# Patient Record
Sex: Female | Born: 1955
Health system: Southern US, Community
[De-identification: ages and names within clinical notes are randomized; demographics above are authoritative.]

## PROBLEM LIST (undated history)

## (undated) DIAGNOSIS — M1990 Unspecified osteoarthritis, unspecified site: Secondary | ICD-10-CM

## (undated) DIAGNOSIS — Z8042 Family history of malignant neoplasm of prostate: Secondary | ICD-10-CM

## (undated) DIAGNOSIS — E785 Hyperlipidemia, unspecified: Secondary | ICD-10-CM

## (undated) DIAGNOSIS — Z9889 Other specified postprocedural states: Secondary | ICD-10-CM

## (undated) DIAGNOSIS — Z803 Family history of malignant neoplasm of breast: Secondary | ICD-10-CM

## (undated) DIAGNOSIS — J309 Allergic rhinitis, unspecified: Secondary | ICD-10-CM

## (undated) DIAGNOSIS — R51 Headache: Secondary | ICD-10-CM

## (undated) DIAGNOSIS — R112 Nausea with vomiting, unspecified: Secondary | ICD-10-CM

## (undated) DIAGNOSIS — R519 Headache, unspecified: Secondary | ICD-10-CM

## (undated) DIAGNOSIS — C50919 Malignant neoplasm of unspecified site of unspecified female breast: Secondary | ICD-10-CM

## (undated) DIAGNOSIS — I10 Essential (primary) hypertension: Secondary | ICD-10-CM

## (undated) HISTORY — DX: Malignant neoplasm of unspecified site of unspecified female breast: C50.919

## (undated) HISTORY — PX: FOOT SURGERY: SHX648

## (undated) HISTORY — PX: COLONOSCOPY: SHX174

## (undated) HISTORY — DX: Family history of malignant neoplasm of breast: Z80.3

## (undated) HISTORY — DX: Hyperlipidemia, unspecified: E78.5

## (undated) HISTORY — DX: Family history of malignant neoplasm of prostate: Z80.42

## (undated) HISTORY — PX: TUBAL LIGATION: SHX77

## (undated) HISTORY — DX: Allergic rhinitis, unspecified: J30.9

## (undated) HISTORY — DX: Unspecified osteoarthritis, unspecified site: M19.90

## (undated) HISTORY — PX: WISDOM TOOTH EXTRACTION: SHX21

## (undated) HISTORY — PX: FRACTURE SURGERY: SHX138

---

## 1997-10-06 ENCOUNTER — Emergency Department (HOSPITAL_COMMUNITY): Admission: EM | Admit: 1997-10-06 | Discharge: 1997-10-06 | Payer: Self-pay

## 1998-01-14 ENCOUNTER — Other Ambulatory Visit: Admission: RE | Admit: 1998-01-14 | Discharge: 1998-01-14 | Payer: Self-pay | Admitting: Obstetrics and Gynecology

## 2000-04-18 ENCOUNTER — Other Ambulatory Visit: Admission: RE | Admit: 2000-04-18 | Discharge: 2000-04-18 | Payer: Self-pay | Admitting: Obstetrics and Gynecology

## 2001-04-26 ENCOUNTER — Other Ambulatory Visit: Admission: RE | Admit: 2001-04-26 | Discharge: 2001-04-26 | Payer: Self-pay | Admitting: Obstetrics and Gynecology

## 2002-07-05 ENCOUNTER — Emergency Department (HOSPITAL_COMMUNITY): Admission: EM | Admit: 2002-07-05 | Discharge: 2002-07-05 | Payer: Self-pay | Admitting: Emergency Medicine

## 2004-12-30 ENCOUNTER — Other Ambulatory Visit: Admission: RE | Admit: 2004-12-30 | Discharge: 2004-12-30 | Payer: Self-pay | Admitting: Family Medicine

## 2006-07-13 ENCOUNTER — Other Ambulatory Visit: Admission: RE | Admit: 2006-07-13 | Discharge: 2006-07-13 | Payer: Self-pay | Admitting: Family Medicine

## 2009-01-20 ENCOUNTER — Other Ambulatory Visit: Admission: RE | Admit: 2009-01-20 | Discharge: 2009-01-20 | Payer: Self-pay | Admitting: Family Medicine

## 2009-04-02 ENCOUNTER — Emergency Department (HOSPITAL_COMMUNITY): Admission: EM | Admit: 2009-04-02 | Discharge: 2009-04-02 | Payer: Self-pay | Admitting: Family Medicine

## 2010-01-22 ENCOUNTER — Other Ambulatory Visit: Admission: RE | Admit: 2010-01-22 | Discharge: 2010-01-22 | Payer: Self-pay | Admitting: Family Medicine

## 2012-08-12 ENCOUNTER — Emergency Department (HOSPITAL_BASED_OUTPATIENT_CLINIC_OR_DEPARTMENT_OTHER)
Admission: EM | Admit: 2012-08-12 | Discharge: 2012-08-12 | Disposition: A | Discharge status: Home / Self Care | Payer: BC Managed Care – PPO | Attending: Emergency Medicine | Admitting: Emergency Medicine

## 2012-08-12 ENCOUNTER — Encounter (HOSPITAL_BASED_OUTPATIENT_CLINIC_OR_DEPARTMENT_OTHER): Payer: Self-pay | Admitting: Emergency Medicine

## 2012-08-12 DIAGNOSIS — R1110 Vomiting, unspecified: Secondary | ICD-10-CM

## 2012-08-12 DIAGNOSIS — Z7982 Long term (current) use of aspirin: Secondary | ICD-10-CM | POA: Insufficient documentation

## 2012-08-12 DIAGNOSIS — R112 Nausea with vomiting, unspecified: Secondary | ICD-10-CM | POA: Insufficient documentation

## 2012-08-12 DIAGNOSIS — R42 Dizziness and giddiness: Secondary | ICD-10-CM

## 2012-08-12 DIAGNOSIS — I10 Essential (primary) hypertension: Secondary | ICD-10-CM | POA: Insufficient documentation

## 2012-08-12 DIAGNOSIS — R109 Unspecified abdominal pain: Secondary | ICD-10-CM | POA: Insufficient documentation

## 2012-08-12 HISTORY — DX: Essential (primary) hypertension: I10

## 2012-08-12 LAB — CBC WITH DIFFERENTIAL/PLATELET
Basophils Absolute: 0 10*3/uL (ref 0.0–0.1)
Basophils Relative: 0 % (ref 0–1)
Eosinophils Absolute: 0 10*3/uL (ref 0.0–0.7)
Eosinophils Relative: 0 % (ref 0–5)
HCT: 34.1 % — ABNORMAL LOW (ref 36.0–46.0)
Hemoglobin: 11.5 g/dL — ABNORMAL LOW (ref 12.0–15.0)
Lymphocytes Relative: 11 % — ABNORMAL LOW (ref 12–46)
Lymphs Abs: 1 10*3/uL (ref 0.7–4.0)
MCH: 24.9 pg — ABNORMAL LOW (ref 26.0–34.0)
MCHC: 33.7 g/dL (ref 30.0–36.0)
MCV: 74 fL — ABNORMAL LOW (ref 78.0–100.0)
Monocytes Absolute: 0.6 10*3/uL (ref 0.1–1.0)
Monocytes Relative: 6 % (ref 3–12)
Neutro Abs: 7.7 10*3/uL (ref 1.7–7.7)
Neutrophils Relative %: 83 % — ABNORMAL HIGH (ref 43–77)
Platelets: 234 10*3/uL (ref 150–400)
RBC: 4.61 MIL/uL (ref 3.87–5.11)
RDW: 14.9 % (ref 11.5–15.5)
WBC: 9.3 10*3/uL (ref 4.0–10.5)

## 2012-08-12 LAB — COMPREHENSIVE METABOLIC PANEL
ALT: 9 U/L (ref 0–35)
AST: 18 U/L (ref 0–37)
Albumin: 3.8 g/dL (ref 3.5–5.2)
Alkaline Phosphatase: 87 U/L (ref 39–117)
BUN: 15 mg/dL (ref 6–23)
CO2: 25 mEq/L (ref 19–32)
Calcium: 9.7 mg/dL (ref 8.4–10.5)
Chloride: 102 mEq/L (ref 96–112)
Creatinine, Ser: 0.9 mg/dL (ref 0.50–1.10)
GFR calc Af Amer: 80 mL/min — ABNORMAL LOW (ref >90)
GFR calc non Af Amer: 69 mL/min — ABNORMAL LOW (ref >90)
Glucose, Bld: 107 mg/dL — ABNORMAL HIGH (ref 70–99)
Potassium: 3.7 mEq/L (ref 3.5–5.1)
Sodium: 139 mEq/L (ref 135–145)
Total Bilirubin: 0.5 mg/dL (ref 0.3–1.2)
Total Protein: 7.9 g/dL (ref 6.0–8.3)

## 2012-08-12 MED ORDER — METOCLOPRAMIDE HCL 5 MG/ML IJ SOLN
10.0000 mg | Freq: Once | INTRAMUSCULAR | Status: AC
  Administered 2012-08-12: 10 mg via INTRAVENOUS
  Filled 2012-08-12: qty 2

## 2012-08-12 MED ORDER — SODIUM CHLORIDE 0.9 % IV SOLN
Freq: Once | INTRAVENOUS | Status: AC
  Administered 2012-08-12: 21:00:00 via INTRAVENOUS

## 2012-08-12 MED ORDER — ONDANSETRON 4 MG PO TBDP
ORAL_TABLET | ORAL | Status: AC
  Administered 2012-08-12: 4 mg via ORAL
  Filled 2012-08-12: qty 1

## 2012-08-12 MED ORDER — MECLIZINE HCL 25 MG PO TABS
25.0000 mg | ORAL_TABLET | Freq: Once | ORAL | Status: AC
  Administered 2012-08-12: 25 mg via ORAL
  Filled 2012-08-12: qty 1

## 2012-08-12 MED ORDER — ONDANSETRON 4 MG PO TBDP: 4.0000 mg | ORAL_TABLET | Freq: Three times a day (TID) | ORAL | Status: DC | PRN

## 2012-08-12 MED ORDER — MECLIZINE HCL 25 MG PO TABS: 25.0000 mg | ORAL_TABLET | Freq: Four times a day (QID) | ORAL | Status: DC

## 2012-08-12 MED ORDER — ONDANSETRON HCL 4 MG/2ML IJ SOLN
4.0000 mg | Freq: Once | INTRAMUSCULAR | Status: AC
  Administered 2012-08-12: 4 mg via INTRAVENOUS
  Filled 2012-08-12: qty 2

## 2012-08-12 MED ORDER — ONDANSETRON 4 MG PO TBDP
4.0000 mg | ORAL_TABLET | Freq: Once | ORAL | Status: AC
  Administered 2012-08-12: 4 mg via ORAL

## 2012-08-12 NOTE — ED Notes (Signed)
Pt c/o nausea, dry heaves- ODT zofran given per protocol

## 2012-08-12 NOTE — ED Notes (Signed)
Pt woke up this am with dizziness, nausea and vomiting.  No known fever.  Some diarrhea.  Pt states she is weak.

## 2012-08-12 NOTE — ED Provider Notes (Signed)
History     CSN: [id number]  Arrival date & time 08/12/12  1818   First MD Initiated Contact with Patient 08/12/12 1935      Chief Complaint  Patient presents with  . Dizziness  . Emesis    (Consider location/radiation/quality/duration/timing/severity/associated sxs/prior treatment) Patient is a 57 y.o. female presenting with vomiting. The history is provided by the patient. No language interpreter was used.  Emesis Severity:  Mild Duration:  1 day Timing:  Intermittent Progression:  Worsening Chronicity:  New Recent urination:  Normal Relieved by:  Nothing Worsened by:  Nothing tried Ineffective treatments:  None tried Associated symptoms: no abdominal pain, no fever and no sore throat   Risk factors: no sick contacts and no suspect food intake     Past Medical History  Diagnosis Date  . Hypertension     No past surgical history on file.  No family history on file.  History  Substance Use Topics  . Smoking status: Not on file  . Smokeless tobacco: Not on file  . Alcohol Use: Not on file    OB History   Grav Para Term Preterm Abortions TAB SAB Ect Mult Living                  Review of Systems  HENT: Negative for sore throat.   Gastrointestinal: Positive for nausea and vomiting. Negative for abdominal pain.  All other systems reviewed and are negative.    Allergies  Review of patient's allergies indicates no known allergies.  Home Medications   Current Outpatient Rx  Name  Route  Sig  Dispense  Refill  . aspirin 81 MG tablet   Oral   Take 81 mg by mouth daily.         Marland Kitchen triamterene (DYRENIUM) 50 MG capsule   Oral   Take 50 mg by mouth 2 (two) times daily.           BP 168/83  Pulse 65  Temp(Src) 98.7 F (37.1 C) (Oral)  Resp 16  Ht 5\' 3"  (1.6 m)  Wt 170 lb (77.111 kg)  BMI 30.12 kg/m2  SpO2 98%  Physical Exam  Nursing note and vitals reviewed. Constitutional: She is oriented to person, place, and time. She appears  well-developed and well-nourished.  HENT:  Head: Normocephalic and atraumatic.  Right Ear: External ear normal.  Nose: Nose normal.  Mouth/Throat: Oropharynx is clear and moist.  Eyes: Conjunctivae and EOM are normal. Pupils are equal, round, and reactive to light.  Neck: Normal range of motion. Neck supple.  Cardiovascular: Normal rate.   Pulmonary/Chest: Effort normal and breath sounds normal.  Abdominal: Soft. There is tenderness.  Musculoskeletal: Normal range of motion.  Neurological: She is alert and oriented to person, place, and time. She has normal reflexes.  Skin: Skin is warm.  Psychiatric: She has a normal mood and affect.    ED Course  Procedures (including critical care time)  Labs Reviewed  CBC WITH DIFFERENTIAL  COMPREHENSIVE METABOLIC PANEL  URINALYSIS, ROUTINE W REFLEX MICROSCOPIC   No results found.   1. Vertigo   2. Vomiting       MDM  Pt given iv fluids, zofran.   Pt reports some continued nausea.   Pt given reglan and antivert.   Dr. Fonnie Jarvis in to see and examine.        Elson Areas, PA-C 08/12/12 2322  Lonia Skinner Delano, PA-C 08/12/12 2326

## 2012-08-12 NOTE — ED Provider Notes (Signed)
Medical screening examination/treatment/procedure(s) were conducted as a shared visit with non-physician practitioner(s) and myself.  I personally evaluated the patient during the encounter.  This 57 year old female woke up early this morning with sudden intermittent peripheral vertigo-type symptoms with nausea and occasional vomiting every time she moves her head, she she is no sudden onset headache no change in hearing no change in speech vision swallowing or understanding no focal or lateralizing weakness numbness or incoordination she is able to walk unassisted he feels safer if she walks with unassisted to her positional vertigo symptoms, she is no headache when her vertigo symptoms started early this morning but by this evening she is gradual onset of a very slight headache which is now severe at all she is no neck stiffness no trauma no fever no rash no chest pain no shortness breath no, pain and only had a couple loose stools which are nonbloody.  Mental status appears to be normal the patient is awake alert calm pleasant cooperative normal speech and gait is slow but not ataxic. Major cranial nerves appear to be intact no facial asymmetry pupils equal round react to light extraocular movements intact peripheral visual fields full to confrontation she does have lateral nystagmus with extraocular movements with fast component one direction only she does not have multidirectional nystagmus she is no vertical nystagmus no rotary nystagmus she has negative test of skew, she has normal light touch over her face and all 4 extremities she has normal 5 out of 5 strength in all 4 extremities with no pronator drift in her arms or legs she has normal bilateral finger to nose testing and gait is a bit slow but not ataxic.  Clinically I doubt subarachnoid hemorrhage stroke or serious bacterial infection I suspect peripheral vertigo and the patient does have peripheral vertigo in the past similar to this and has  occurred once every several months or so.  Hurman Horn, MD 08/14/12 [phone]

## 2013-03-27 ENCOUNTER — Other Ambulatory Visit: Payer: Self-pay | Admitting: Family Medicine

## 2013-03-27 ENCOUNTER — Other Ambulatory Visit (HOSPITAL_COMMUNITY)
Admission: RE | Admit: 2013-03-27 | Discharge: 2013-03-27 | Disposition: A | Discharge status: Home / Self Care | Payer: BC Managed Care – PPO | Source: Ambulatory Visit | Attending: Family Medicine | Admitting: Family Medicine

## 2013-03-27 DIAGNOSIS — Z0000 Encounter for general adult medical examination without abnormal findings: Secondary | ICD-10-CM | POA: Insufficient documentation

## 2013-10-23 ENCOUNTER — Ambulatory Visit (INDEPENDENT_AMBULATORY_CARE_PROVIDER_SITE_OTHER): Payer: BC Managed Care – PPO | Admitting: Emergency Medicine

## 2013-10-23 VITALS — BP 135/80 | HR 68 | Temp 98.5°F | Resp 16

## 2013-10-23 DIAGNOSIS — M76899 Other specified enthesopathies of unspecified lower limb, excluding foot: Secondary | ICD-10-CM

## 2013-10-23 DIAGNOSIS — M7071 Other bursitis of hip, right hip: Secondary | ICD-10-CM

## 2013-10-23 MED ORDER — ACETAMINOPHEN-CODEINE #3 300-30 MG PO TABS: 1.0000 | ORAL_TABLET | ORAL | Status: DC | PRN

## 2013-10-23 MED ORDER — NAPROXEN SODIUM 550 MG PO TABS: 550.0000 mg | ORAL_TABLET | Freq: Two times a day (BID) | ORAL | Status: AC

## 2013-10-23 NOTE — Progress Notes (Signed)
[facility] [address] [place] [address] [phone]  Date:  10/23/2013   Name:  Emily Perez   DOB:  November 17, 1955   MRN:  [id number]  PCP:  Shirline Frees, MD    Chief Complaint: Hip Pain   History of Present Illness:  Emily Perez is a 58 y.o. very pleasant female patient who presents with the following:  Sudden onset of pain in right hip in middle of the night.  Awakened her from a sound sleep.  During the evening she had used a treadmill for a workout.  Had no injury or unusual exertion. No history of injury previously.  No history of inflammatory joint disease. No improvement with over the counter medications or other home remedies. Denies other complaint or health concern today.   There are no active problems to display for this patient.   Past Medical History  Diagnosis Date  . Hypertension   . Arthritis     No past surgical history on file.  History  Substance Use Topics  . Smoking status: Never Smoker   . Smokeless tobacco: Not on file  . Alcohol Use: Not on file    Family History  Problem Relation Age of Onset  . Heart disease Mother   . Cancer Father   . Diabetes Father   . Hypertension Father   . Hyperlipidemia Father   . Heart disease Brother   . Hypertension Brother   . Hyperlipidemia Brother   . Diabetes Brother     No Known Allergies  Medication list has been reviewed and updated.  Current Outpatient Prescriptions on File Prior to Visit  Medication Sig Dispense Refill  . aspirin 81 MG tablet Take 81 mg by mouth daily.      Marland Kitchen triamterene (DYRENIUM) 50 MG capsule Take 50 mg by mouth 2 (two) times daily.       No current facility-administered medications on file prior to visit.    Review of Systems:  As per HPI, otherwise negative.    Physical Examination: Filed Vitals:   10/23/13 1934  BP: 135/80  Pulse: 68  Temp: 98.5 F (36.9 C)  Resp: 16   There were no vitals filed for this  visit. There is no weight on file to calculate BMI. Ideal Body Weight:     GEN: WDWN, NAD, Non-toxic, Alert & Oriented x 3 HEENT: Atraumatic, Normocephalic.  Ears and Nose: No external deformity. EXTR: No clubbing/cyanosis/edema NEURO: Normal gait.  PSYCH: Normally interactive. Conversant. Not depressed or anxious appearing.  Calm demeanor.  Marked right hip tenderness with full painless PROM.  Assessment and Plan: Bursitis right hip Anaprox tyl #3  Signed,  Ellison Carwin, MD

## 2013-10-23 NOTE — Patient Instructions (Signed)
Bursitis Bursitis Bursitis is a swelling and soreness (inflammation) of a fluid-filled sac (bursa) that overlies and protects a joint. It can be caused by injury, overuse of the joint, arthritis or infection. The joints most likely to be affected are the elbows, shoulders, hips and knees. HOME CARE INSTRUCTIONS   Apply ice to the affected area for 15-20 minutes each hour while awake for 2 days. Put the ice in a plastic bag and place a towel between the bag of ice and your skin.  Rest the injured joint as much as possible, but continue to put the joint through a full range of motion, 4 times per day. (The shoulder joint especially becomes rapidly "frozen" if not used.) When the pain lessens, begin normal slow movements and usual activities.  Only take over-the-counter or prescription medicines for pain, discomfort or fever as directed by your caregiver.  Your caregiver may recommend draining the bursa and injecting medicine into the bursa. This may help the healing process.  Follow all instructions for follow-up with your caregiver. This includes any orthopedic referrals, physical therapy and rehabilitation. Any delay in obtaining necessary care could result in a delay or failure of the bursitis to heal and chronic pain. SEEK IMMEDIATE MEDICAL CARE IF:   Your pain increases even during treatment.  You develop an oral temperature above 102 F (38.9 C) and have heat and inflammation over the involved bursa. MAKE SURE YOU:   Understand these instructions.  Will watch your condition.  Will get help right away if you are not doing well or get worse. Document Released: 03/05/2000 Document Revised: 05/31/2011 Document Reviewed: 05/28/2013 [facility] Patient Information 2015 [place], [place]. This information is not intended to replace advice given to you by your health care provider. Make sure you discuss any questions you have with your health care provider.

## 2014-10-08 ENCOUNTER — Other Ambulatory Visit (HOSPITAL_COMMUNITY)
Admission: RE | Admit: 2014-10-08 | Discharge: 2014-10-08 | Disposition: A | Discharge status: Home / Self Care | Payer: BC Managed Care – PPO | Source: Ambulatory Visit | Attending: Family Medicine | Admitting: Family Medicine

## 2014-10-08 ENCOUNTER — Other Ambulatory Visit: Payer: Self-pay | Admitting: Family Medicine

## 2014-10-08 DIAGNOSIS — Z01419 Encounter for gynecological examination (general) (routine) without abnormal findings: Secondary | ICD-10-CM | POA: Insufficient documentation

## 2014-10-08 DIAGNOSIS — Z1151 Encounter for screening for human papillomavirus (HPV): Secondary | ICD-10-CM | POA: Diagnosis present

## 2014-10-09 LAB — CYTOLOGY - PAP

## 2015-02-06 ENCOUNTER — Ambulatory Visit (INDEPENDENT_AMBULATORY_CARE_PROVIDER_SITE_OTHER): Payer: BC Managed Care – PPO | Admitting: Emergency Medicine

## 2015-02-06 VITALS — BP 130/84 | HR 98 | Temp 98.5°F | Resp 16 | Ht 63.52 in | Wt 182.0 lb

## 2015-02-06 DIAGNOSIS — M5431 Sciatica, right side: Secondary | ICD-10-CM | POA: Diagnosis not present

## 2015-02-06 MED ORDER — TRAMADOL HCL 50 MG PO TABS: 50.0000 mg | ORAL_TABLET | Freq: Three times a day (TID) | ORAL | Status: DC | PRN

## 2015-02-06 MED ORDER — CYCLOBENZAPRINE HCL 5 MG PO TABS: 5.0000 mg | ORAL_TABLET | Freq: Three times a day (TID) | ORAL | Status: DC | PRN

## 2015-02-06 NOTE — Patient Instructions (Signed)

## 2015-02-06 NOTE — Progress Notes (Signed)
Subjective:  Patient ID: Emily Perez, female    DOB: 1956-01-07  Age: 59 y.o. MRN: [id number]  CC: Leg Pain   HPI Emily Perez presents  with pain in her right sciatic notch radiating down her right leg. When he went on a number of headaches over the weekend traveling of about 11 miles. Since that time she's had right sciatic notch pain. Worse when she sits. She stands or walks. History of direct injury. Has no improvement with over-the-counter medication is no history of prior back pain back injury. She has no numbness tingling or weakness in her leg  History [name] has a past medical history of Hypertension and Arthritis.   She has no past surgical history on file.   Her  family history includes Cancer in her father and sister; Diabetes in her brother and father; Heart disease in her brother and mother; Hyperlipidemia in her brother and father; Hypertension in her brother and father.  She   reports that she has never smoked. She does not have any smokeless tobacco history on file. She reports that she does not drink alcohol or use illicit drugs.  Outpatient Prescriptions Prior to Visit  Medication Sig Dispense Refill  . aspirin 81 MG tablet Take 81 mg by mouth daily.    Marland Kitchen triamterene (DYRENIUM) 50 MG capsule Take 50 mg by mouth 2 (two) times daily.    Marland Kitchen acetaminophen-codeine (TYLENOL #3) 300-30 MG per tablet Take 1-2 tablets by mouth every 4 (four) hours as needed. 30 tablet 0   No facility-administered medications prior to visit.    Social History   Social History  . Marital Status: Legally Separated    Spouse Name: N/A  . Number of Children: N/A  . Years of Education: N/A   Social History Main Topics  . Smoking status: Never Smoker   . Smokeless tobacco: None  . Alcohol Use: No  . Drug Use: No  . Sexual Activity: Not Asked   Other Topics Concern  . None   Social History Narrative     Review of Systems  Constitutional: Negative for fever, chills  and appetite change.  HENT: Negative for congestion, ear pain, postnasal drip, sinus pressure and sore throat.   Eyes: Negative for pain and redness.  Respiratory: Negative for cough, shortness of breath and wheezing.   Cardiovascular: Negative for leg swelling.  Gastrointestinal: Negative for nausea, vomiting, abdominal pain, diarrhea, constipation and blood in stool.  Endocrine: Negative for polyuria.  Genitourinary: Negative for dysuria, urgency, frequency and flank pain.  Musculoskeletal: Positive for back pain. Negative for gait problem.  Skin: Negative for rash.  Neurological: Negative for weakness and headaches.  Psychiatric/Behavioral: Negative for confusion and decreased concentration. The patient is not nervous/anxious.     Objective:  BP 130/84 mmHg  Pulse 98  Temp(Src) 98.5 F (36.9 C) (Oral)  Resp 16  Ht 5' 3.52" (1.613 m)  Wt 182 lb (82.555 kg)  BMI 31.73 kg/m2  SpO2 98%  Physical Exam  Constitutional: She is oriented to person, place, and time. She appears well-developed and well-nourished.  HENT:  Head: Normocephalic and atraumatic.  Eyes: Conjunctivae are normal. Pupils are equal, round, and reactive to light.  Pulmonary/Chest: Effort normal.  Musculoskeletal: She exhibits no edema.       Lumbar back: She exhibits pain. She exhibits no tenderness and no spasm.  Neurological: She is alert and oriented to person, place, and time.  Skin: Skin is dry.  Psychiatric: She has  a normal mood and affect. Her behavior is normal. Thought content normal.      Assessment & Plan:   Aerielle was seen today for leg pain.  Diagnoses and all orders for this visit:  Right sciatic nerve pain  Other orders -     traMADol (ULTRAM) 50 MG tablet; Take 1 tablet (50 mg total) by mouth every 8 (eight) hours as needed. -     cyclobenzaprine (FLEXERIL) 5 MG tablet; Take 1 tablet (5 mg total) by mouth 3 (three) times daily as needed for muscle spasms.   I have discontinued Ms.  Piscitello's acetaminophen-codeine. I am also having her start on traMADol and cyclobenzaprine. Additionally, I am having her maintain her triamterene and aspirin.  Meds ordered this encounter  Medications  . traMADol (ULTRAM) 50 MG tablet    Sig: Take 1 tablet (50 mg total) by mouth every 8 (eight) hours as needed.    Dispense:  30 tablet    Refill:  0  . cyclobenzaprine (FLEXERIL) 5 MG tablet    Sig: Take 1 tablet (5 mg total) by mouth 3 (three) times daily as needed for muscle spasms.    Dispense:  30 tablet    Refill:  0    Appropriate red flag conditions were discussed with the patient as well as actions that should be taken.  Patient expressed his understanding.  Follow-up: Return if symptoms worsen or fail to improve.  Roselee Culver, MD

## 2015-05-08 ENCOUNTER — Encounter: Payer: BC Managed Care – PPO | Admitting: Podiatry

## 2015-06-20 NOTE — Progress Notes (Signed)
This encounter was created in error - please disregard.

## 2016-08-12 ENCOUNTER — Ambulatory Visit: Payer: BC Managed Care – PPO | Admitting: Podiatry

## 2016-08-27 ENCOUNTER — Ambulatory Visit: Payer: BC Managed Care – PPO | Admitting: Podiatry

## 2016-11-26 ENCOUNTER — Other Ambulatory Visit (HOSPITAL_COMMUNITY)
Admission: RE | Admit: 2016-11-26 | Discharge: 2016-11-26 | Disposition: A | Discharge status: Home / Self Care | Payer: BC Managed Care – PPO | Source: Ambulatory Visit | Attending: Family Medicine | Admitting: Family Medicine

## 2016-11-26 ENCOUNTER — Other Ambulatory Visit: Payer: Self-pay | Admitting: Family Medicine

## 2016-11-26 DIAGNOSIS — N950 Postmenopausal bleeding: Secondary | ICD-10-CM | POA: Insufficient documentation

## 2016-11-29 ENCOUNTER — Other Ambulatory Visit: Payer: Self-pay | Admitting: Family Medicine

## 2016-11-29 DIAGNOSIS — N950 Postmenopausal bleeding: Secondary | ICD-10-CM

## 2016-11-30 LAB — CYTOLOGY - PAP: Diagnosis: NEGATIVE

## 2016-12-21 ENCOUNTER — Ambulatory Visit
Admission: RE | Admit: 2016-12-21 | Discharge: 2016-12-21 | Disposition: A | Discharge status: Home / Self Care | Payer: BC Managed Care – PPO | Source: Ambulatory Visit | Attending: Family Medicine | Admitting: Family Medicine

## 2016-12-21 DIAGNOSIS — N950 Postmenopausal bleeding: Secondary | ICD-10-CM

## 2017-01-07 ENCOUNTER — Other Ambulatory Visit (HOSPITAL_COMMUNITY)
Admission: RE | Admit: 2017-01-07 | Discharge: 2017-01-07 | Disposition: A | Discharge status: Home / Self Care | Payer: BC Managed Care – PPO | Source: Ambulatory Visit | Attending: Obstetrics and Gynecology | Admitting: Obstetrics and Gynecology

## 2017-01-07 ENCOUNTER — Other Ambulatory Visit: Payer: Self-pay | Admitting: Obstetrics and Gynecology

## 2017-01-07 DIAGNOSIS — Z1151 Encounter for screening for human papillomavirus (HPV): Secondary | ICD-10-CM | POA: Insufficient documentation

## 2017-01-11 LAB — CERVICOVAGINAL ANCILLARY ONLY: HPV: NOT DETECTED

## 2017-05-02 ENCOUNTER — Other Ambulatory Visit: Payer: Self-pay | Admitting: Radiology

## 2017-05-05 ENCOUNTER — Encounter: Payer: Self-pay | Admitting: *Deleted

## 2017-05-05 ENCOUNTER — Telehealth: Payer: Self-pay | Admitting: Oncology

## 2017-05-05 NOTE — Telephone Encounter (Signed)
Spoke with patient to confirm morning [facility] appointment for 2/20, [facility] patient no packet sent

## 2017-05-06 ENCOUNTER — Other Ambulatory Visit: Payer: Self-pay | Admitting: *Deleted

## 2017-05-06 DIAGNOSIS — C50511 Malignant neoplasm of lower-outer quadrant of right female breast: Secondary | ICD-10-CM | POA: Insufficient documentation

## 2017-05-06 DIAGNOSIS — Z171 Estrogen receptor negative status [ER-]: Secondary | ICD-10-CM

## 2017-05-10 ENCOUNTER — Encounter: Payer: Self-pay | Admitting: Genetics

## 2017-05-11 ENCOUNTER — Encounter: Payer: Self-pay | Admitting: Physical Therapy

## 2017-05-11 ENCOUNTER — Encounter: Payer: Self-pay | Admitting: *Deleted

## 2017-05-11 ENCOUNTER — Ambulatory Visit
Admission: RE | Admit: 2017-05-11 | Discharge: 2017-05-11 | Disposition: A | Discharge status: Home / Self Care | Payer: BC Managed Care – PPO | Source: Ambulatory Visit | Attending: Radiation Oncology | Admitting: Radiation Oncology

## 2017-05-11 ENCOUNTER — Inpatient Hospital Stay: Payer: BC Managed Care – PPO

## 2017-05-11 ENCOUNTER — Encounter: Payer: Self-pay | Admitting: Oncology

## 2017-05-11 ENCOUNTER — Encounter: Payer: Self-pay | Admitting: General Practice

## 2017-05-11 ENCOUNTER — Inpatient Hospital Stay: Payer: BC Managed Care – PPO | Attending: Oncology | Admitting: Oncology

## 2017-05-11 ENCOUNTER — Ambulatory Visit: Payer: BC Managed Care – PPO | Attending: General Surgery | Admitting: Physical Therapy

## 2017-05-11 ENCOUNTER — Other Ambulatory Visit: Payer: Self-pay

## 2017-05-11 VITALS — BP 141/82 | HR 77 | Temp 98.3°F | Resp 20 | Ht 63.5 in | Wt 190.7 lb

## 2017-05-11 DIAGNOSIS — R293 Abnormal posture: Secondary | ICD-10-CM | POA: Insufficient documentation

## 2017-05-11 DIAGNOSIS — Z171 Estrogen receptor negative status [ER-]: Secondary | ICD-10-CM | POA: Insufficient documentation

## 2017-05-11 DIAGNOSIS — C50511 Malignant neoplasm of lower-outer quadrant of right female breast: Secondary | ICD-10-CM

## 2017-05-11 DIAGNOSIS — M25511 Pain in right shoulder: Secondary | ICD-10-CM | POA: Insufficient documentation

## 2017-05-11 DIAGNOSIS — Z803 Family history of malignant neoplasm of breast: Secondary | ICD-10-CM | POA: Diagnosis not present

## 2017-05-11 DIAGNOSIS — M25611 Stiffness of right shoulder, not elsewhere classified: Secondary | ICD-10-CM | POA: Diagnosis present

## 2017-05-11 DIAGNOSIS — C50411 Malignant neoplasm of upper-outer quadrant of right female breast: Secondary | ICD-10-CM | POA: Insufficient documentation

## 2017-05-11 DIAGNOSIS — G8929 Other chronic pain: Secondary | ICD-10-CM | POA: Insufficient documentation

## 2017-05-11 LAB — CMP (CANCER CENTER ONLY)
ALT: 11 U/L (ref 0–55)
AST: 16 U/L (ref 5–34)
Albumin: 3.7 g/dL (ref 3.5–5.0)
Alkaline Phosphatase: 74 U/L (ref 40–150)
Anion gap: 11 (ref 3–11)
BUN: 33 mg/dL — ABNORMAL HIGH (ref 7–26)
CO2: 27 mmol/L (ref 22–29)
Calcium: 9.7 mg/dL (ref 8.4–10.4)
Chloride: 103 mmol/L (ref 98–109)
Creatinine: 1.12 mg/dL — ABNORMAL HIGH (ref 0.60–1.10)
GFR, Est AFR Am: 59 mL/min — ABNORMAL LOW (ref >60)
GFR, Estimated: 51 mL/min — ABNORMAL LOW (ref >60)
Glucose, Bld: 84 mg/dL (ref 70–140)
Potassium: 3.7 mmol/L (ref 3.5–5.1)
Sodium: 141 mmol/L (ref 136–145)
Total Bilirubin: 0.4 mg/dL (ref 0.2–1.2)
Total Protein: 8.1 g/dL (ref 6.4–8.3)

## 2017-05-11 LAB — CBC WITH DIFFERENTIAL (CANCER CENTER ONLY)
Basophils Absolute: 0 10*3/uL (ref 0.0–0.1)
Basophils Relative: 0 %
Eosinophils Absolute: 0.2 10*3/uL (ref 0.0–0.5)
Eosinophils Relative: 2 %
HCT: 37 % (ref 34.8–46.6)
Hemoglobin: 11.6 g/dL (ref 11.6–15.9)
Lymphocytes Relative: 22 %
Lymphs Abs: 2.3 10*3/uL (ref 0.9–3.3)
MCH: 24.6 pg — ABNORMAL LOW (ref 25.1–34.0)
MCHC: 31.4 g/dL — ABNORMAL LOW (ref 31.5–36.0)
MCV: 78.6 fL — ABNORMAL LOW (ref 79.5–101.0)
Monocytes Absolute: 1 10*3/uL — ABNORMAL HIGH (ref 0.1–0.9)
Monocytes Relative: 9 %
Neutro Abs: 6.9 10*3/uL — ABNORMAL HIGH (ref 1.5–6.5)
Neutrophils Relative %: 67 %
Platelet Count: 243 10*3/uL (ref 145–400)
RBC: 4.71 MIL/uL (ref 3.70–5.45)
RDW: 14.7 % — ABNORMAL HIGH (ref 11.2–14.5)
WBC Count: 10.4 10*3/uL — ABNORMAL HIGH (ref 3.9–10.3)

## 2017-05-11 NOTE — Therapy (Addendum)
[place], [place], [address] Phone: [phone]   Fax:  [phone]  Physical Therapy Evaluation  Patient Details  Name: Emily Perez MRN: [id number] Date of Birth: [birth year] Referring Provider: Dr. Rolm Bookbinder   Encounter Date: 05/11/2017  PT End of Session - 05/11/17 1257    Visit Number  1    Number of Visits  1    PT Start Time  6962    PT Stop Time  9528 Also saw pt from 4132-4401 and 1150-1204 for a total of 36 minutes    PT Time Calculation (min)  14 min    Activity Tolerance  Patient tolerated treatment well    Behavior During Therapy  [facility] for tasks assessed/performed       Past Medical History:  Diagnosis Date  . Arthritis   . Hypertension     History reviewed. No pertinent surgical history.  There were no vitals filed for this visit.   Subjective Assessment - 05/11/17 1246    Subjective  Patient reports she is here to be seen by her medical team for her newly diagnosed right breast cancer.    Patient is accompained by:  Family member    Pertinent History  Patient was diagnosed on 04/19/17 with right triple negative breast cancer. It measures 2.5 cm and is located in the upper outer quadrant. She had 3 abnormal appearing axillary lymph nodes. One of those was found to be positive.     Patient Stated Goals  Reduce lymphedema risk and learn post op shoulder ROM HEP    Currently in Pain?  Yes    Pain Score  8  This is from a fall at work    Pain Location  Shoulder    Pain Orientation  Right    Pain Descriptors / Indicators  Aching    Pain Type  Chronic pain    Pain Onset  More than a month ago    Pain Frequency  Intermittent    Aggravating Factors   Laying down    Pain Relieving Factors  Medication    Multiple Pain Sites  No         [facility] PT Assessment - 05/11/17 0001      Assessment   Medical Diagnosis  Right shoulder pain    Referring Provider  Dr. Rolm Bookbinder    Onset Date/Surgical Date  04/19/17    Hand Dominance  Right    Prior Therapy  none      Precautions   Precautions  Other (comment)    Precaution Comments  active cancer      Restrictions   Weight Bearing Restrictions  No      Balance Screen   Has the patient fallen in the past 6 months  Yes    How many times?  1 At work being treated for a rotator cuff tear by orthopedist    Has the patient had a decrease in activity level because of a fear of falling?   No    Is the patient reluctant to leave their home because of a fear of falling?   No      Home Environment   Living Environment  Private residence    Living Arrangements  Alone    Available Help at Discharge  Family      Prior Function   Level of Independence  Independent    Vocation  Retired    Leisure  She walks  on a treadmill 3-4x/week for an hour      Cognition   Overall Cognitive Status  Within Functional Limits for tasks assessed      Posture/Postural Control   Posture/Postural Control  Postural limitations    Postural Limitations  Rounded Shoulders;Forward head      ROM / Strength   AROM / PROM / Strength  AROM;Strength      AROM   Overall AROM Comments  Bil shoulders with limitations and pain with AROM with right worse than left related to pain    AROM Assessment Site  Shoulder;Cervical    Right/Left Shoulder  Right;Left    Right Shoulder Extension  45 Degrees    Right Shoulder Flexion  152 Degrees    Right Shoulder ABduction  139 Degrees    Right Shoulder Internal Rotation  52 Degrees    Right Shoulder External Rotation  88 Degrees    Left Shoulder Extension  46 Degrees    Left Shoulder Flexion  140 Degrees    Left Shoulder ABduction  134 Degrees    Left Shoulder Internal Rotation  47 Degrees    Left Shoulder External Rotation  88 Degrees    Cervical Flexion  WNL    Cervical Extension  WNL    Cervical - Right Side Bend  WNL    Cervical - Left Side Bend  WNL    Cervical - Right Rotation   WNL    Cervical - Left Rotation  WNL      Strength   Overall Strength  Within functional limits for tasks performed        LYMPHEDEMA/ONCOLOGY QUESTIONNAIRE - 05/11/17 1255      Type   Cancer Type  Right breast cancer      Lymphedema Assessments   Lymphedema Assessments  Upper extremities      Right Upper Extremity Lymphedema   10 cm Proximal to Olecranon Process  35.1 cm    Olecranon Process  29 cm    10 cm Proximal to Ulnar Styloid Process  26 cm    Just Proximal to Ulnar Styloid Process  18.5 cm    Across Hand at [employer]  19.7 cm    At [place] of 2nd Digit  6.3 cm      Left Upper Extremity Lymphedema   10 cm Proximal to Olecranon Process  34.7 cm    Olecranon Process  28.8 cm    10 cm Proximal to Ulnar Styloid Process  26 cm    Just Proximal to Ulnar Styloid Process  18.8 cm    Across Hand at [employer]  19.5 cm    At [place] of 2nd Digit  6.2 cm          Objective measurements completed on examination: See above findings.    Patient was instructed today in a home exercise program today for post op shoulder range of motion. These included active assist shoulder flexion in sitting, scapular retraction, wall walking with shoulder abduction, and hands behind head external rotation.  She was encouraged to do these twice a day, holding 3 seconds and repeating 5 times when permitted by her physician.     PT Education - 05/11/17 1256    Education provided  Yes    Education Details  Lymphedema risk reduction and post op shoulder ROM HEP    Person(s) Educated  Patient;Child(ren)    Methods  Explanation;Demonstration;Handout    Comprehension  Returned demonstration;Verbalized understanding  PT Long Term Goals - 05/11/17 1302      PT LONG TERM GOAL #1   Title  Patient able to demonstrate she has returned to baseline related to shoulder ROM and function.    Time  8    Period  Weeks    Status  New      Breast Clinic Goals - 05/11/17 1301       Patient will be able to verbalize understanding of pertinent lymphedema risk reduction practices relevant to her diagnosis specifically related to skin care.   Time  1    Period  Days    Status  Achieved      Patient will be able to return demonstrate and/or verbalize understanding of the post-op home exercise program related to regaining shoulder range of motion.   Time  1    Period  Days    Status  Achieved      Patient will be able to verbalize understanding of the importance of attending the postoperative After Breast Cancer Class for further lymphedema risk reduction education and therapeutic exercise.   Time  1    Period  Days    Status  Achieved            Plan - 05/11/17 1257    Clinical Impression Statement  Patient was diagnosed on 04/19/17 with right triple negative breast cancer. It measures 2.5 cm and is located in the upper outer quadrant. She had 3 abnormal appearing axillary lymph nodes. One of those was found to be positive. Her multidisciplinary medical team met prior to her assessments to determine a recommended treatment plan. She is planning to have neoadjuvant chemotherapy followed by a right lumpectomy and targeted axillary node dissection, radiation, and anti-estrogen therapy. She will benefit from a post op PT visit to reassess and determine PT needs.    History and Personal Factors relevant to plan of care:  Lives alone    Clinical Presentation  Stable    Clinical Decision Making  Low    Rehab Potential  Excellent    Clinical Impairments Affecting Rehab Potential  None    PT Frequency  -- Eval and 1 f/u visit    PT Treatment/Interventions  Patient/family education;Therapeutic exercise;ADLs/Self Care Home Management    PT Next Visit Plan  Will f/u 3-4 weeks post op to determine needs    PT Home Exercise Plan  Post op shoulder ROM HEP    Consulted and Agree with Plan of Care  Patient;Family member/caregiver    Family Member Consulted  Daughter        Patient will benefit from skilled therapeutic intervention in order to improve the following deficits and impairments:  Postural dysfunction, Decreased knowledge of precautions, Impaired UE functional use, Decreased range of motion, Pain  Visit Diagnosis: Malignant neoplasm of upper-outer quadrant of right breast in female, estrogen receptor negative ([place]) - Plan: PT plan of care cert/re-cert  Abnormal posture - Plan: PT plan of care cert/re-cert  Chronic right shoulder pain - Plan: PT plan of care cert/re-cert  Stiffness of right shoulder, not elsewhere classified - Plan: PT plan of care cert/re-cert   NOTE: Physical therapy for her right shoulder has been recommended. It would need to be approved by worker's comp and seen by an approved provider. But in light of her cancer diagnosis and the need for axillary surgery resulting in lymphedema risk, PT to try to avoid right shoulder surgery would be in her best interest.  Problem List Patient  Active Problem List   Diagnosis Date Noted  . Malignant neoplasm of lower-outer quadrant of right breast of female, estrogen receptor negative ([place]) 05/06/2017    Annia Friendly, PT 05/11/17 1:07 PM  [place], [place], [address] Phone: [phone]   Fax:  [phone]  Name: Emily Perez MRN: [id number] Date of Birth: June 22, 1955

## 2017-05-11 NOTE — Patient Instructions (Signed)

## 2017-05-11 NOTE — Progress Notes (Signed)
START ON PATHWAY REGIMEN - Breast     A cycle is every 21 days (cycles 1-4):     Paclitaxel      Carboplatin    A cycle is every 14 days (cycles 5-8):     Doxorubicin      Cyclophosphamide      Pegfilgrastim-xxxx   **Always confirm dose/schedule in your pharmacy ordering system**    Patient Characteristics: Preoperative or Nonsurgical Candidate (Clinical Staging), Neoadjuvant Therapy followed by Surgery, Invasive Disease, Chemotherapy, HER2 Negative/Unknown/Equivocal, ER Negative/Unknown, Platinum Therapy Indicated Therapeutic Status: Preoperative or Nonsurgical Candidate (Clinical Staging) AJCC M Category: cM0 AJCC Grade: G2 Breast Surgical Plan: Neoadjuvant Therapy followed by Surgery ER Status: Negative (-) AJCC 8 Stage Grouping: IIIB HER2 Status: Negative (-) AJCC T Category: cT2 AJCC N Category: cN1 PR Status: Negative (-) Type of Therapy: Platinum Therapy Indicated Intent of Therapy: Curative Intent, Discussed with Patient

## 2017-05-11 NOTE — Progress Notes (Signed)
[place] Psychosocial Distress Screening Clinical Social Work  Clinical Social Work was referred by distress screening protocol.  The patient scored a 5 on the Psychosocial Distress Thermometer which indicates moderate distress. Clinical Social Worker Edwyna Shell to assess for distress and other psychosocial needs. CSW and patient discussed common feeling and emotions when being diagnosed with cancer, and the importance of support during treatment. CSW informed patient of the support team and support services at [facility]. CSW provided contact information and encouraged patient to call with any questions or concerns.  Patient and daughter discussed feelings associated w diagnosis and learning extent of treatment plan needed.  Patient is "numb", "processing", "I dont want to use the word worrying...."  Is retired Web designer, organized.  Has support from daughter and church.  Faith community is Retail banker.  Needs time to process impact of diagnosis and treatment plan.  CSW reviewed [place] and need to practice self care and anxiety management during course of treatment, patient reports she is very interested in self care strategies and support.  Packet provided.       ONCBCN DISTRESS SCREENING 05/11/2017  Screening Type Initial Screening  Distress experienced in past week (1-10) 5  Practical problem type Insurance  Emotional problem type Nervousness/Anxiety;Adjusting to illness;Isolation/feeling alone  Spiritual/Religous concerns type Relating to God  Information Concerns Type Lack of info about diagnosis;Lack of info about treatment  Physical Problem type Pain;Loss of appetitie;Skin dry/itchy  Physician notified of physical symptoms Yes  Referral to clinical psychology No  Referral to clinical social work Yes  Referral to dietition No  Referral to support programs Yes  Referral to palliative care No     Clinical Social Worker follow up needed: Yes.    If yes,  follow up plan:  Will recontact patient when she is in infusion room or similar to continue to assess needs/concerns.  Edwyna Shell, LCSW Clinical Social Worker Phone:  [phone]

## 2017-05-11 NOTE — Progress Notes (Signed)
Nutrition Assessment  Reason for Assessment:  Pt seen in Breast Clinic  ASSESSMENT:   62 year old female with new diagnosis of breast cancer.  Past medical history reviewed.    Patient reports poor appetite for the last week due to new diagnosis.  Weight has been stable  Medications:  reviewed  Labs: reviewed  Anthropometrics:   Height: 63.5 inches Weight: 190 lb BMI: 33   NUTRITION DIAGNOSIS: Food and nutrition related knowledge deficit related to new diagnosis of breast cancer as evidenced by no prior need for nutrition related information.  INTERVENTION:   Discussed and provided packet of information regarding nutritional tips for breast cancer patients.  Questions answered.  Teachback method used.  Contact information provided and patient knows to contact me with questions/concerns.    MONITORING, EVALUATION, and GOAL: Pt will consume a healthy plant based diet to maintain lean body mass throughout treatment.   Zenith Kercheval B. Zenia Resides, [place], [place] Registered Dietitian [phone] (pager)

## 2017-05-11 NOTE — Progress Notes (Signed)
Radiation Oncology         [phone] ________________________________  Initial Outpatient Consultation  Name: Emily Perez MRN: [id number]  Date: 05/11/2017  DOB: 26-Mar-1955  ZJ:IRCVEL, Gwyndolyn Saxon, MD  Rolm Bookbinder, MD   REFERRING PHYSICIAN: Rolm Bookbinder, MD  DIAGNOSIS: 62 year-old woman with Clinical Stage T2N0 triple negative invasive ductal carcinoma of the right breast.   The encounter diagnosis was Malignant neoplasm of lower-outer quadrant of right breast of female, estrogen receptor negative ([place]).  HISTORY OF PRESENT ILLNESS::Emily Perez is a 62 y.o. female who is seen today in our multidisciplinary breast clinic. She originally presented for screening mammogram on 04/19/2017 which showed an indeterminate 2 cm oval mass in the right breast. Accordingly a diagnostic mammogram and ultrasound were performed. This imaging showed a 2.5 cm irregular mass in the right breast lower outer quadrant anterior depth highly suggestive of malignancy. Also seen were abnormal appearing nodes in the right axilla suspicious of malignancy. Biopsy of the right breast showed invasive ductal carcinoma. 1 of 1 right lymph node biopsied was positive for metastatic carcinoma. The carcinoma appears at least grade II. ER 0% / PR 0% / HER-2 negative / Ki-67 80%.  The patient is here for further evaluation and discussion of treatment options for the management of her disease.  PREVIOUS RADIATION THERAPY: No  PAST MEDICAL HISTORY:  has a past medical history of Arthritis and Hypertension.    PAST SURGICAL HISTORY:No past surgical history on file.  FAMILY HISTORY: family history includes Breast cancer in her sister; Cancer in her father and sister; Diabetes in her brother and father; Heart disease in her brother and mother; Hyperlipidemia in her brother and father; Hypertension in her brother and father.  SOCIAL HISTORY:  reports that  has never smoked. She does not have any smokeless  tobacco history on file. She reports that she does not drink alcohol or use drugs.  ALLERGIES: Latex  MEDICATIONS:  Current Outpatient Medications  Medication Sig Dispense Refill  . aspirin 81 MG tablet Take 81 mg by mouth daily.    . Camphor-Eucalyptus-Menthol (VICKS VAPORUB EX) Apply 1 application topically as needed (colds or bug bites).    . cyclobenzaprine (FLEXERIL) 5 MG tablet Take 1 tablet (5 mg total) by mouth 3 (three) times daily as needed for muscle spasms. (Patient not taking: Reported on 05/12/2017) 30 tablet 0  . fluocinonide cream (LIDEX) 3.81 % Apply 1 application topically 2 (two) times daily as needed (rash).    . halobetasol (ULTRAVATE) 0.05 % cream Apply 1 application topically 2 (two) times daily as needed (rash).    . meclizine (ANTIVERT) 12.5 MG tablet Take 6.25 mg by mouth 3 (three) times daily as needed for dizziness.    . naproxen sodium (ALEVE) 220 MG tablet Take 110-220 mg by mouth daily as needed (pain). Takes 0.5-1 tablet as needed for pain    . OVER THE COUNTER MEDICATION Apply 1 application topically at bedtime. Pain Relief Balm    . tiZANidine (ZANAFLEX) 4 MG tablet TAKE 0.5-1 TABLET AS NEEDED FOR MUSCLE SPASMS  1  . traMADol (ULTRAM) 50 MG tablet Take 1 tablet (50 mg total) by mouth every 8 (eight) hours as needed. (Patient not taking: Reported on 05/12/2017) 30 tablet 0  . triamterene-hydrochlorothiazide (DYAZIDE) 37.5-25 MG capsule Take 1 capsule by mouth daily.  3  . vitamin B-12 (CYANOCOBALAMIN) 1000 MCG tablet Take 500 mcg by mouth daily. Takes 0.5 tablet     No current facility-administered medications for  this encounter.     REVIEW OF SYSTEMS:  REVIEW OF SYSTEMS: A 10+ POINT REVIEW OF SYSTEMS WAS OBTAINED including neurology, dermatology, psychiatry, cardiac, respiratory, lymph, extremities, GI, GU, musculoskeletal, constitutional, reproductive, HEENT. All pertinent positives are noted in the HPI. All others are negative.   PHYSICAL EXAM:  Vitals with  BMI 05/11/2017  Height 5' 3.5"  Weight 190 lbs 11 oz  BMI 25.63  Systolic 893  Diastolic 82  Pulse 77  Respirations 20   In general this is a well appearing female in no acute distress. She's alert and oriented x4 and appropriate throughout the examination. Lungs are clear to auscultation bilaterally. Heart has regular rate and rhythm. No palpable cervical, supraclavicular, or axillary adenopathy. Abdomen soft, non-tender, normal bowel sounds. Breast exam shows left breast with no palpable mass or nipple discharge. Right breast has a palpable mass in the periareolar area lower outer quadrant measuring approximately 3.5 x 3 cm, partially underneath the areolar border but does not appear to involve the nipple areolar complex. No nipple discharge or bleeding. Patient has some thickening in the right axillary region which is suspicious for lymphadenopathy versus bruising from recent biopsy.    ECOG = 0  0 - Asymptomatic (Fully active, able to carry on all predisease activities without restriction)  1 - Symptomatic but completely ambulatory (Restricted in physically strenuous activity but ambulatory and able to carry out work of a light or sedentary nature. For example, light housework, office work)  2 - Symptomatic, <50% in bed during the day (Ambulatory and capable of all self care but unable to carry out any work activities. Up and about more than 50% of waking hours)  3 - Symptomatic, >50% in bed, but not bedbound (Capable of only limited self-care, confined to bed or chair 50% or more of waking hours)  4 - Bedbound (Completely disabled. Cannot carry on any self-care. Totally confined to bed or chair)  5 - Death   Eustace Pen MM, Creech RH, Tormey DC, et al. [phone]). "Toxicity and response criteria of the [facility]". [place] Oncol. 5 (6): 649-55  LABORATORY DATA:  Lab Results  Component Value Date   WBC 10.4 (H) 05/11/2017   HGB 11.5 (L) 08/12/2012   HCT 37.0  05/11/2017   MCV 78.6 (L) 05/11/2017   PLT 243 05/11/2017   NEUTROABS 6.9 (H) 05/11/2017   Lab Results  Component Value Date   NA 141 05/11/2017   K 3.7 05/11/2017   CL 103 05/11/2017   CO2 27 05/11/2017   GLUCOSE 84 05/11/2017   CREATININE 1.12 (H) 05/11/2017   CALCIUM 9.7 05/11/2017      RADIOGRAPHY: No results found.    IMPRESSION: 62 year-old woman with Clinical Stage T2N0 triple negative invasive ductal carcinoma of the right breast (triple negative)  Patient would appear to be a good candidate for neo-adjuvant treatment. She does appear to be interested in breast conserving therapy and will proceed with neo-adjuvant treatment followed by definitive surgery including lumpectomy and either targeted axillary dissection or full axillary dissection, depending on response to therapy. If the patient is not a candidate for breast conserving surgery, I would recommend post-mastectomy radiation therapy given her clinical presentation with multiple suspicious nodes on imaging and biopsy proven metastatic spread to the axilla.  PLAN:   1) Port placement for neoadjuvant chemotherapy.  2) Neoadjuvant chemotherapy. 3) MRI for further evaluation. 4) Genetics. 5) Staging workup with CT Chest / Bone scan. 6) Surgical approach to  be determined after neo-adjuvant chemotherapy. 7) Radiation to follow surgery.    ------------------------------------------------  Blair Promise, PhD, MD  This document serves as a record of services personally performed by Gery Pray, MD. It was created on his behalf by Arlyce Harman, a trained medical scribe. The creation of this record is based on the scribe's personal observations and the provider's statements to them. This document has been checked and approved by the attending provider.

## 2017-05-11 NOTE — Progress Notes (Signed)
[place]  [phone] [phone]     ID: Emily Perez DOB: 11-20-1955  MR#: [id number]  JQZ#:[id number]  Patient Care Team: Shirline Frees, MD as PCP - General (Family Medicine) Magrinat, Virgie Dad, MD as Consulting Physician (Oncology) Rolm Bookbinder, MD as Consulting Physician (General Surgery) Gery Pray, MD as Consulting Physician (Radiation Oncology) Allyn Kenner, MD (Dermatology) Thurnell Lose, MD as Consulting Physician (Obstetrics and Gynecology) Marchia Bond, MD as Consulting Physician (Orthopedic Surgery) OTHER MD:  CHIEF COMPLAINT: Triple negative breast cancer  CURRENT TREATMENT: Neoadjuvant chemotherapy pending  HISTORY OF CURRENT ILLNESS: Emily Perez had routine bilateral screening mammography at [facility] on 04/19/2017 showing a possible abnormality in the right breast. She underwent unilateral right diagnostic mammography with tomography and right breast ultrasonography at [facility] on 04/25/2017 showing: breast density category B.  In the right breast at the 6:00 radiant 2 cm from the nipple there was a 2 cm oval mass which by ultrasound measured 2.5 cm and was irregular and hypoechoic.  There were also at least 3 abnormal appearing lymph nodes in the right axilla.  Accordingly on 05/02/2017 she proceeded to biopsy of the right breast mass in question and lymph node sampling. The pathology from this procedure showed ([id number]): Invasive ductal carcinoma grade II. One lymph node positive for metastatic carcinoma (1/1). Prognostic indicators significant for: estrogen receptor, 0% negative and progesterone receptor, 0% negative. Proliferation marker Ki67 at 80%. HER2 not amplified with ratios HER2/CEP17 signals 1.29 and average copies per cell 2.25  The patient's subsequent history is as detailed below.  INTERVAL HISTORY: Emily Perez was evaluated in the multidisciplinary breast cancer clinic on 05/11/2017 accompanied by her  daughter, Emily Perez. Her case was also presented at the multidisciplinary breast cancer conference on the same day. At that time a preliminary plan was proposed: Breast MRI, neoadjuvant chemotherapy, followed by radiation, and consideration of SWOG trial is 1418 depending on final pathology results   REVIEW OF SYSTEMS: There were no specific symptoms leading to the original mammogram, which was routinely scheduled. The patient denies unusual headaches, visual changes, nausea, vomiting, stiff neck, dizziness, or gait imbalance. There has been no cough, phlegm production, or pleurisy, no chest pain or pressure, and no change in bowel or bladder habits. The patient denies fever, rash, bleeding, unexplained fatigue or unexplained weight loss. A detailed review of systems was otherwise entirely negative.   PAST MEDICAL HISTORY: Past Medical History:  Diagnosis Date  . Arthritis   . Hypertension   Migraines in the past.   PAST SURGICAL HISTORY: No past surgical history on file.  Wrist Fracture. Torn Rotator cuff and torn meniscus.    FAMILY HISTORY Family History  Problem Relation Age of Onset  . Heart disease Mother   . Cancer Father   . Diabetes Father   . Hypertension Father   . Hyperlipidemia Father   . Heart disease Brother   . Hypertension Brother   . Hyperlipidemia Brother   . Diabetes Brother   . Cancer Sister        breast  . Breast cancer Sister   The patient's father died at age 58 due to heart disease and emphysema. The patient's mother died at age 26 due to heart disease. The patient has 6 brothers and 8 sisters. She notes 2 sisters with breast cancer. The 1st sister was diagnosed at age 29, and the 2nd sister was diagnosed at age 45. She notes that both sisters are alive. She also notes a paternal  first cousin with breast cancer diagnosed in her 42's. The patient notes that her father also had bone cancer, but she's unsure if he had myeloma. She denies a family history of ovarian  cancer.   GYNECOLOGIC HISTORY:  No LMP recorded. Patient is postmenopausal. 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 She is divorced. She lives by herself with no pets. The patient's daughter, Emily Perez, works in Therapist, art for [employer]. The patient's son, Emily Perez,  works for [employer]. The patient has  2 grandchildren. She belongs to [facility] [employer] [facility].      ADVANCED DIRECTIVES: Not in place.  At the 05/19/2017 visit the patient was given the appropriate documents to complete and notarized at her discretion   HEALTH MAINTENANCE: Social History   Tobacco Use  . Smoking status: Never Smoker  Substance Use Topics  . Alcohol use: No  . Drug use: No     Colonoscopy:   PAP: September 2018 normal  Bone density: none   No Known Allergies  Current Outpatient Medications  Medication Sig Dispense Refill  . aspirin 81 MG tablet Take 81 mg by mouth daily.    . cyclobenzaprine (FLEXERIL) 5 MG tablet Take 1 tablet (5 mg total) by mouth 3 (three) times daily as needed for muscle spasms. 30 tablet 0  . traMADol (ULTRAM) 50 MG tablet Take 1 tablet (50 mg total) by mouth every 8 (eight) hours as needed. 30 tablet 0  . triamterene (DYRENIUM) 50 MG capsule Take 50 mg by mouth 2 (two) times daily.     No current facility-administered medications for this visit.     OBJECTIVE: Middle-aged African-American Perez in no acute distress  Vitals:   05/11/17 0901  BP: (!) 141/82  Pulse: 77  Resp: 20  Temp: 98.3 F (36.8 C)  SpO2: 98%     Body mass index is 33.25 kg/m.   Wt Readings from Last 3 Encounters:  05/11/17 190 lb 11.2 oz (86.5 kg)  02/06/15 182 lb (82.6 kg)  08/12/12 170 lb (77.1 kg)      ECOG FS:0 - Asymptomatic  Ocular: Sclerae unicteric, pupils round and equal Ear-nose-throat:  Oropharynx clear and moist Lymphatic: No cervical or supraclavicular adenopathy Lungs no rales or rhonchi Heart regular rate and rhythm Abd soft, nontender, positive bowel sounds MSK no focal spinal tenderness, no joint edema Neuro: non-focal, well-oriented, a ppropriate affect Breasts: The right breast is status post recent biopsy.  The area under and lateral to the right areola is somewhat thickened.  There are no skin or nipple changes of concern.  The left breast is benign.  Both axillae are benign.   LAB RESULTS:  CMP     Component Value Date/Time   NA 141 05/11/2017 0833   K 3.7 05/11/2017 0833   CL 103 05/11/2017 0833   CO2 27 05/11/2017 0833   GLUCOSE 84 05/11/2017 0833   BUN 33 (H) 05/11/2017 0833   CREATININE 1.12 (H) 05/11/2017 0833   CALCIUM 9.7 05/11/2017 0833   PROT 8.1 05/11/2017 0833   ALBUMIN 3.7 05/11/2017 0833   AST 16 05/11/2017 0833   ALT 11 05/11/2017 0833   ALKPHOS 74 05/11/2017 0833   BILITOT 0.4 05/11/2017 0833   GFRNONAA 51 (L) 05/11/2017 0833   GFRAA 59 (L) 05/11/2017 3734  No results found for: TOTALPROTELP, ALBUMINELP, A1GS, A2GS, BETS, BETA2SER, GAMS, MSPIKE, SPEI  No results found for: KPAFRELGTCHN, LAMBDASER, KAPLAMBRATIO  Lab Results  Component Value Date   WBC 10.4 (H) 05/11/2017   NEUTROABS 6.9 (H) 05/11/2017   HGB 11.5 (L) 08/12/2012   HCT 37.0 05/11/2017   MCV 78.6 (L) 05/11/2017   PLT 243 05/11/2017    _0 @  No results found for: LABCA2  No components found for: HOZYYQ825  No results for input(s): INR in the last 168 hours.  No results found for: LABCA2  No results found for: OIB704  No results found for: UGQ916  No results found for: XIH038  No results found for: CA2729  No components found for: HGQUANT  No results found for: CEA1 / No results found for: CEA1   No results found for: AFPTUMOR  No results found for: CHROMOGRNA  No results found for: PSA1  Appointment on 05/11/2017  Component  Date Value Ref Range Status  . WBC Count 05/11/2017 10.4* 3.9 - 10.3 K/uL Final  . RBC 05/11/2017 4.71  3.70 - 5.45 MIL/uL Final  . Hemoglobin 05/11/2017 11.6  11.6 - 15.9 g/dL Final  . HCT 05/11/2017 37.0  34.8 - 46.6 % Final  . MCV 05/11/2017 78.6* 79.5 - 101.0 fL Final  . MCH 05/11/2017 24.6* 25.1 - 34.0 pg Final  . MCHC 05/11/2017 31.4* 31.5 - 36.0 g/dL Final  . RDW 05/11/2017 14.7* 11.2 - 14.5 % Final  . Platelet Count 05/11/2017 243  145 - 400 K/uL Final  . Neutrophils Relative % 05/11/2017 67  % Final  . Neutro Abs 05/11/2017 6.9* 1.5 - 6.5 K/uL Final  . Lymphocytes Relative 05/11/2017 22  % Final  . Lymphs Abs 05/11/2017 2.3  0.9 - 3.3 K/uL Final  . Monocytes Relative 05/11/2017 9  % Final  . Monocytes Absolute 05/11/2017 1.0* 0.1 - 0.9 K/uL Final  . Eosinophils Relative 05/11/2017 2  % Final  . Eosinophils Absolute 05/11/2017 0.2  0.0 - 0.5 K/uL Final  . Basophils Relative 05/11/2017 0  % Final  . Basophils Absolute 05/11/2017 0.0  0.0 - 0.1 K/uL Final   Performed at [facility], [place] [address], [place], [place] [address]  . Sodium 05/11/2017 141  136 - 145 mmol/L Final  . Potassium 05/11/2017 3.7  3.5 - 5.1 mmol/L Final  . Chloride 05/11/2017 103  98 - 109 mmol/L Final  . CO2 05/11/2017 27  22 - 29 mmol/L Final  . Glucose, Bld 05/11/2017 84  70 - 140 mg/dL Final  . BUN 05/11/2017 33* 7 - 26 mg/dL Final  . Creatinine 05/11/2017 1.12* 0.60 - 1.10 mg/dL Final  . Calcium 05/11/2017 9.7  8.4 - 10.4 mg/dL Final  . Total Protein 05/11/2017 8.1  6.4 - 8.3 g/dL Final  . Albumin 05/11/2017 3.7  3.5 - 5.0 g/dL Final  . AST 05/11/2017 16  5 - 34 U/L Final  . ALT 05/11/2017 11  0 - 55 U/L Final  . Alkaline Phosphatase 05/11/2017 74  40 - 150 U/L Final  . Total Bilirubin 05/11/2017 0.4  0.2 - 1.2 mg/dL Final  . GFR, Est Non Af Am 05/11/2017 51* >60 mL/min Final  . GFR, Est AFR Am 05/11/2017 59* >60 mL/min Final   Comment: (NOTE) The eGFR has been calculated  using the CKD EPI equation. This calculation has not been validated in all clinical situations. eGFR's persistently <60 mL/min signify possible Chronic Kidney Disease.   Marland Kitchen  Anion gap 05/11/2017 11  3 - 11 Final   Performed at [facility], [place] [address] [place], [place] [address]    (this displays the last labs from the last 3 days)  No results found for: TOTALPROTELP, ALBUMINELP, A1GS, A2GS, BETS, BETA2SER, GAMS, MSPIKE, SPEI (this displays SPEP labs)  No results found for: KPAFRELGTCHN, LAMBDASER, KAPLAMBRATIO (kappa/lambda light chains)  No results found for: HGBA, HGBA2QUANT, HGBFQUANT, HGBSQUAN (Hemoglobinopathy evaluation)   No results found for: LDH  No results found for: IRON, TIBC, IRONPCTSAT (Iron and TIBC)  No results found for: FERRITIN  Urinalysis No results found for: COLORURINE, APPEARANCEUR, LABSPEC, PHURINE, GLUCOSEU, HGBUR, BILIRUBINUR, KETONESUR, PROTEINUR, UROBILINOGEN, NITRITE, LEUKOCYTESUR   STUDIES: Chest CT and bone scan pending  ELIGIBLE FOR AVAILABLE RESEARCH PROTOCOL: [facility] SWOG [phone]; not a candidate for breast MRI study given history of claustrophobia  ASSESSMENT: 62 y.o. Emily Perez status post right breast upper outer quadrant biopsy 05/02/2017 for a clinical T2 N1-2, stage IIIB invasive ductal carcinoma, grade 2-3, triple negative, with an MIB-180%  (1) neoadjuvant chemotherapy to consist of doxorubicin and cyclophosphamide in dose dense fashion x4 followed by carboplatin and paclitaxel weekly x12  (2) definitive surgery to follow  (3) consider SWOG [id number] depending on final pathology results  (4) adjuvant radiation to follow  (5) genetics testing pending  PLAN: We spent the better part of today's hour-long appointment discussing the biology of her diagnosis and the specifics of her situation. We first reviewed the fact that cancer is not one disease but more than 100 different diseases and that it is  important to keep them separate-- otherwise when friends and relatives discuss their own cancer experiences with Emily Perez confusion can result. Similarly we explained that if breast cancer spreads to the bone or liver, the patient would not have bone cancer or liver cancer, but breast cancer in the bone and breast cancer in the liver: one cancer in three places-- not 3 different cancers which otherwise would have to be treated in 3 different ways.  We discussed the difference between local and systemic therapy. In terms of loco-regional treatment, lumpectomy plus radiation is equivalent to mastectomy as far as survival is concerned. For this reason, and because the cosmetic results are generally superior, we generally recommend breast conserving surgery.   We also noted that in terms of sequencing of treatments, whether systemic therapy or surgery is done first does not affect the ultimate outcome.  This is relevant to her situation, as noted below  We then discussed the rationale for systemic therapy. There is some risk that this cancer may have already spread to other parts of her body.  In stage III cases we routinely obtain a CT scan of the chest and a bone scan and this has been ordered.  She understands however that even if these are negative, there is a significant chance that she already has microscopic disease elsewhere in her body, outside of the breast and axilla.  Next we went over the options for systemic therapy which are anti-estrogens, anti-HER-2 immunotherapy, and chemotherapy. Emily Perez does not meet criteria for antiestrogens or anti-HER-2 immunotherapy.  Her only option for systemic therapy is chemotherapy and that accordingly is what we recommend.  More specifically she will receive doxorubicin and cyclophosphamide in dose dense fashion x4 followed by weekly carboplatin and paclitaxel x12.  We discussed some of the possible toxicity side effects and complications of these agents and she will  also come to our chemotherapy teaching  for more details.  She will need a port and an echo.  She will return to see me on 05/20/2017 to review all those results and discuss how to take her supportive medicines with the chemo  She also qualifies for genetics testing.  I think there is a significant chance there may be a deleterious mutation present here in which case her plan for definitive surgery may need to be adjusted. In patients who carry a deleterious mutation [for example in a  BRCA gene], the risk of a new breast cancer developing in the future may be sufficiently great that the patient may choose bilateral mastectomies. However if she wishes to keep her breasts in that situation it is safe to do so. That would require intensified screening, which generally means not only yearly mammography but a yearly breast MRI as well. Of course, if there is a deleterious mutation bilateral oophorectomy would be necessary as there is no standard screening protocol for ovarian cancer.  In short, the plan is to start with chemotherapy, follow-up with definitive surgery, and then closed with adjuvant radiation.  Emily Perez has a good understanding of the overall plan. She agrees with it. She knows the goal of treatment in her case is cure. She will call with any problems that may develop before her next visit here.   Magrinat, Virgie Dad, MD  05/11/17 11:37 AM Medical Oncology and Hematology [facility] [address] [place], [place] [address] Tel. [phone]    Fax. [phone]  This document serves as a record of services personally performed by Lurline Del, MD. It was created on his behalf by Sheron Nightingale, a trained medical scribe. The creation of this record is based on the scribe's personal observations and the provider's statements to them.   I have reviewed the above documentation for accuracy and completeness, and I agree with the above.

## 2017-05-13 ENCOUNTER — Telehealth: Payer: Self-pay | Admitting: General Practice

## 2017-05-13 ENCOUNTER — Other Ambulatory Visit: Payer: Self-pay | Admitting: General Surgery

## 2017-05-13 NOTE — Telephone Encounter (Signed)
[facility] CSW Progress Notes  Cal from patient, concerned about affording cost of care.  Asks for financial assistance resources.  Reviewed criteria for [place] [facility] [place] and [employer] , patient reports she does not qualify for either program.  Gave contact information for [facility] and encouraged patient to call as this agency qualifies patients directly.  Also referred patient to Mining engineer for consideration of [employer] and/or [employer].  Edwyna Shell, LCSW Clinical Social Worker Phone:  [phone]

## 2017-05-16 ENCOUNTER — Telehealth: Payer: Self-pay | Admitting: *Deleted

## 2017-05-16 NOTE — Telephone Encounter (Signed)
Spoke to pt regarding [place] from 05/11/17. Confirmed further appts. Discussed reasoning for surgery after chemotherapy and staging scans. Denies questions concerning dx. Encourage pt to call with further needs or questions. Received verbal understanding.

## 2017-05-17 ENCOUNTER — Encounter: Payer: Self-pay | Admitting: *Deleted

## 2017-05-17 ENCOUNTER — Inpatient Hospital Stay: Payer: BC Managed Care – PPO

## 2017-05-17 ENCOUNTER — Ambulatory Visit
Admission: RE | Admit: 2017-05-17 | Discharge: 2017-05-17 | Disposition: A | Discharge status: Home / Self Care | Payer: BC Managed Care – PPO | Source: Ambulatory Visit | Attending: Oncology | Admitting: Oncology

## 2017-05-17 ENCOUNTER — Ambulatory Visit (HOSPITAL_COMMUNITY)
Admission: RE | Admit: 2017-05-17 | Discharge: 2017-05-17 | Disposition: A | Discharge status: Home / Self Care | Payer: BC Managed Care – PPO | Source: Ambulatory Visit | Attending: Oncology | Admitting: Oncology

## 2017-05-17 DIAGNOSIS — Z171 Estrogen receptor negative status [ER-]: Secondary | ICD-10-CM | POA: Diagnosis not present

## 2017-05-17 DIAGNOSIS — I2510 Atherosclerotic heart disease of native coronary artery without angina pectoris: Secondary | ICD-10-CM | POA: Insufficient documentation

## 2017-05-17 DIAGNOSIS — C50511 Malignant neoplasm of lower-outer quadrant of right female breast: Secondary | ICD-10-CM | POA: Diagnosis present

## 2017-05-17 DIAGNOSIS — R590 Localized enlarged lymph nodes: Secondary | ICD-10-CM | POA: Insufficient documentation

## 2017-05-17 DIAGNOSIS — I10 Essential (primary) hypertension: Secondary | ICD-10-CM | POA: Insufficient documentation

## 2017-05-17 DIAGNOSIS — I700 Atherosclerosis of aorta: Secondary | ICD-10-CM | POA: Diagnosis not present

## 2017-05-17 MED ORDER — GADOBENATE DIMEGLUMINE 529 MG/ML IV SOLN
18.0000 mL | Freq: Once | INTRAVENOUS | Status: AC | PRN
  Administered 2017-05-17: 18 mL via INTRAVENOUS

## 2017-05-17 NOTE — Progress Notes (Signed)
  Echocardiogram 2D Echocardiogram has been performed.  Emily Perez M 05/17/2017, 10:53 AM

## 2017-05-18 ENCOUNTER — Encounter (HOSPITAL_COMMUNITY)
Admission: RE | Admit: 2017-05-18 | Discharge: 2017-05-18 | Disposition: A | Discharge status: Home / Self Care | Payer: BC Managed Care – PPO | Source: Ambulatory Visit | Attending: Oncology | Admitting: Oncology

## 2017-05-18 ENCOUNTER — Encounter (HOSPITAL_COMMUNITY): Payer: Self-pay | Admitting: *Deleted

## 2017-05-18 ENCOUNTER — Other Ambulatory Visit: Payer: Self-pay

## 2017-05-18 ENCOUNTER — Ambulatory Visit (HOSPITAL_COMMUNITY)
Admission: RE | Admit: 2017-05-18 | Discharge: 2017-05-18 | Disposition: A | Discharge status: Home / Self Care | Payer: BC Managed Care – PPO | Source: Ambulatory Visit | Attending: Oncology | Admitting: Oncology

## 2017-05-18 DIAGNOSIS — C50511 Malignant neoplasm of lower-outer quadrant of right female breast: Secondary | ICD-10-CM

## 2017-05-18 DIAGNOSIS — Z171 Estrogen receptor negative status [ER-]: Secondary | ICD-10-CM

## 2017-05-18 MED ORDER — IOPAMIDOL (ISOVUE-300) INJECTION 61%
INTRAVENOUS | Status: AC
  Filled 2017-05-18: qty 75

## 2017-05-18 MED ORDER — SODIUM CHLORIDE 0.9 % IJ SOLN
INTRAMUSCULAR | Status: AC
  Filled 2017-05-18: qty 50

## 2017-05-18 MED ORDER — TECHNETIUM TC 99M MEDRONATE IV KIT
25.0000 | PACK | Freq: Once | INTRAVENOUS | Status: AC | PRN
  Administered 2017-05-18: 20.7 via INTRAVENOUS

## 2017-05-18 MED ORDER — IOPAMIDOL (ISOVUE-300) INJECTION 61%
75.0000 mL | Freq: Once | INTRAVENOUS | Status: AC | PRN
  Administered 2017-05-18: 75 mL via INTRAVENOUS

## 2017-05-18 NOTE — Progress Notes (Signed)
Spoke with pt for pre-op call. Pt denies cardiac history, chest pain, sob or diabetes. 

## 2017-05-18 NOTE — Progress Notes (Signed)
[place]  Telephone:([phone]) [phone] Fax:(336) [phone]     ID: Emily Perez DOB: [birth year]  MR#: [id number]  ZRA#:[id number]  Patient Care Team: Shirline Frees, MD as PCP - General (Family Medicine) Aanika Defoor, Virgie Dad, MD as Consulting Physician (Oncology) Rolm Bookbinder, MD as Consulting Physician (General Surgery) Gery Pray, MD as Consulting Physician (Radiation Oncology) Allyn Kenner, MD (Dermatology) Thurnell Lose, MD as Consulting Physician (Obstetrics and Gynecology) Marchia Bond, MD as Consulting Physician (Orthopedic Surgery) Christene Slates, MD as Physician Assistant (Radiology) OTHER MD:  CHIEF COMPLAINT: Triple negative breast cancer  CURRENT TREATMENT: Neoadjuvant chemotherapy   INTERVAL HISTORY: Emily Perez returns today for follow up and treatment of her triple negative breast cancer accompanied by her daughter.  On 05/25/2017, she starts neoadjuvant chemotherapy with Doxorubicin and Cyclophosphamide dose dense--even every 14 days x4, to be followed by weekly paclitaxel x12.  Bilateral breast MRI on 05/18/2017 showed: The biopsy proven triple negative cancer in the lower outer right breast measures up to 6.8 cm in an oblique plane by MRI. There are no additional sites of disease identified in the right breast. The biopsy-proven metastatic lymph node is seen in the right axilla. No additional clearly abnormal right axillary lymph nodes are identified. There is a probable 1 cm internal mammary lymph node on the right, slightly asymmetric to the contralateral side which has a 0.5-0.6 cm probable internal mammary lymph node at a similar location on the left. No evidence of malignancy in the left breast.  Since her last visit she completed a CT chest scan the same day showing: 3 cm right breast soft tissue mass with overlying skin thickening, consistent with known primary breast carcinoma. Mild right axillary and subpectoral lymphadenopathy. No other  sites of metastatic disease within the thorax. Aortic Atherosclerosis (ICD10-I70.0). Coronary artery calcification.  Baseline echocardiogram 05/17/2017 showed an ejection fraction of 55-60%.  She had a whole body scan the on 05/18/2017 showing: No scintigraphic evidence of osseous metastatic disease  She had a port placed 05/19/2017. She notes that this was not painful for her. She has a small bruise in the area.   REVIEW OF SYSTEMS: Emily Perez reports that she had a CT scan and whole body scan. She says that chemotherapy school was very informative. She is retired from work. She had a few headaches this week that came and went. She denies sinus symptoms. She notes that her heart is strong because she works out very often. She used to walk and hike often until she was injured from a fall at work. She now goes to [employer] about 4 times per week and gets on the treadmill for about an hour. She always cleans the gym equipment before she uses it. She reports a throbbing pain in the right breast. She denies unusual headaches, visual changes, nausea, vomiting, or dizziness. There has been no unusual cough, phlegm production, or pleurisy. This been no change in bowel or bladder habits. She denies unexplained fatigue or unexplained weight loss, bleeding, rash, or fever. A detailed review of systems was otherwise stable.    HISTORY OF CURRENT ILLNESS: Emily Perez had routine bilateral screening mammography at [facility] on 04/19/2017 showing a possible abnormality in the right breast. She underwent unilateral right diagnostic mammography with tomography and right breast ultrasonography at [facility] on 04/25/2017 showing: breast density category B.  In the right breast at the 6:00 radiant 2 cm from the nipple there was a 2 cm oval mass which by ultrasound measured 2.5 cm and  was irregular and hypoechoic.  There were also at least 3 abnormal appearing lymph nodes in the right axilla.  Accordingly on 05/02/2017  she proceeded to biopsy of the right breast mass in question and lymph node sampling. The pathology from this procedure showed ([id number]): Invasive ductal carcinoma grade II. One lymph node positive for metastatic carcinoma (1/1). Prognostic indicators significant for: estrogen receptor, 0% negative and progesterone receptor, 0% negative. Proliferation marker Ki67 at 80%. HER2 not amplified with ratios HER2/CEP17 signals 1.29 and average copies per cell 2.25  The patient's subsequent history is as detailed below.    PAST MEDICAL HISTORY: Past Medical History:  Diagnosis Date  . Arthritis   . Headache    migraines in the past  . Hypertension   . PONV (postoperative nausea and vomiting)    after 1st colonoscopy  Migraines in the past.   PAST SURGICAL HISTORY: Past Surgical History:  Procedure Laterality Date  . COLONOSCOPY    . FRACTURE SURGERY Left    wrist    Wrist Fracture. Torn Rotator cuff and torn meniscus.    FAMILY HISTORY Family History  Problem Relation Age of Onset  . Heart disease Mother   . Cancer Father   . Diabetes Father   . Hypertension Father   . Hyperlipidemia Father   . Heart disease Brother   . Hypertension Brother   . Hyperlipidemia Brother   . Diabetes Brother   . Cancer Sister        breast  . Breast cancer Sister   The patient's father died at age 96 due to heart disease and emphysema. The patient's mother died at age 99 due to heart disease. The patient has 6 brothers and 8 sisters. She notes 2 sisters with breast cancer. The 1st sister was diagnosed at age 74, and the 2nd sister was diagnosed at age 32. She notes that both sisters are alive. She also notes a paternal first cousin with breast cancer diagnosed in her 51's. The patient notes that her father also had bone cancer, but she's unsure if he had myeloma. She denies a family history of ovarian cancer.   GYNECOLOGIC HISTORY:  No LMP recorded. Patient is postmenopausal. 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 She is divorced. She lives by herself with no pets. The patient's daughter, Emily Perez, works in Therapist, art for [employer]. The patient's son, Emily Perez,  works for [employer]. The patient has  2 grandchildren. She belongs to [facility].      ADVANCED DIRECTIVES: Not in place.  At the 05/19/2017 visit the patient was given the appropriate documents to complete and notarized at her discretion   HEALTH MAINTENANCE: Social History   Tobacco Use  . Smoking status: Never Smoker  . Smokeless tobacco: Never Used  Substance Use Topics  . Alcohol use: No  . Drug use: No     Colonoscopy:   PAP: September 2018 normal  Bone density: none   Allergies  Allergen Reactions  . Latex Hives and Rash    Current Outpatient Medications  Medication Sig Dispense Refill  . acetaminophen (TYLENOL) 160 MG/5ML elixir Take 15 mg/kg by mouth every 4 (four) hours as needed for fever.    Marland Kitchen aspirin 81 MG tablet Take 81 mg by mouth daily.    Marland Kitchen  Camphor-Eucalyptus-Menthol (VICKS VAPORUB EX) Apply 1 application topically as needed (colds or bug bites).    . cyclobenzaprine (FLEXERIL) 5 MG tablet Take 1 tablet (5 mg total) by mouth 3 (three) times daily as needed for muscle spasms. (Patient not taking: Reported on 05/12/2017) 30 tablet 0  . dexamethasone (DECADRON) 4 MG tablet Take 2 tablets by mouth once a day on the day after chemotherapy and then take 2 tablets two times a day for 2 days. Take with food. 30 tablet 1  . diphenhydramine-acetaminophen (TYLENOL PM) 25-500 MG TABS tablet Take 1 tablet by mouth at bedtime as needed.    . fluocinonide cream (LIDEX) 2.95 % Apply 1 application topically 2 (two) times daily as needed (rash).    . halobetasol (ULTRAVATE) 0.05 % cream Apply 1 application  topically 2 (two) times daily as needed (rash).    Marland Kitchen lidocaine-prilocaine (EMLA) cream Apply to affected area once 30 g 3  . LORazepam (ATIVAN) 0.5 MG tablet Take 1 tablet (0.5 mg total) by mouth at bedtime as needed (Nausea or vomiting). 30 tablet 0  . meclizine (ANTIVERT) 12.5 MG tablet Take 6.25 mg by mouth 3 (three) times daily as needed for dizziness.    . naproxen sodium (ALEVE) 220 MG tablet Take 110-220 mg by mouth daily as needed (pain). Takes 0.5-1 tablet as needed for pain    . ondansetron (ZOFRAN-ODT) 8 MG disintegrating tablet Take 8 mg by mouth every 8 (eight) hours as needed for nausea or vomiting.    Marland Kitchen OVER THE COUNTER MEDICATION Apply 1 application topically at bedtime. Pain Relief Balm    . oxyCODONE (OXY IR/ROXICODONE) 5 MG immediate release tablet Take 1 tablet (5 mg total) by mouth every 6 (six) hours as needed for moderate pain, severe pain or breakthrough pain. 10 tablet 0  . prochlorperazine (COMPAZINE) 10 MG tablet Take 1 tablet (10 mg total) by mouth every 6 (six) hours as needed (Nausea or vomiting). 30 tablet 1  . tiZANidine (ZANAFLEX) 4 MG tablet TAKE 0.5-1 TABLET AS NEEDED FOR MUSCLE SPASMS  1  . traMADol (ULTRAM) 50 MG tablet Take 1 tablet (50 mg total) by mouth every 8 (eight) hours as needed. (Patient not taking: Reported on 05/12/2017) 30 tablet 0  . triamterene-hydrochlorothiazide (DYAZIDE) 37.5-25 MG capsule Take 1 capsule by mouth daily.  3  . vitamin B-12 (CYANOCOBALAMIN) 1000 MCG tablet Take 500 mcg by mouth daily. Takes 0.5 tablet     No current facility-administered medications for this visit.     OBJECTIVE: Middle-aged African-American woman who appears well  Vitals:   05/20/17 1324  BP: 136/72  Pulse: 92  Resp: 18  Temp: 97.9 F (36.6 C)  SpO2: 96%     Body mass index is 33.09 kg/m.   Wt Readings from Last 3 Encounters:  05/20/17 189 lb 12.8 oz (86.1 kg)  05/19/17 190 lb 11.2 oz (86.5 kg)  05/11/17 190 lb 11.2 oz (86.5 kg)      ECOG FS:0 -  Asymptomatic  Sclerae unicteric, EOMs intact Oropharynx clear and moist No cervical or supraclavicular adenopathy Lungs no rales or rhonchi Heart regular rate and rhythm Abd soft, nontender, positive bowel sounds MSK no focal spinal tenderness, no upper extremity lymphedema Neuro: nonfocal, well oriented, appropriate affect Breasts: The right breast is status post biopsy.  The area under the areola is somewhat thickened.  There is no skin involvement.  The left breast is benign.  Both axillae are benign.  LAB RESULTS:  CMP  Component Value Date/Time   NA 141 05/11/2017 0833   K 3.7 05/11/2017 0833   CL 103 05/11/2017 0833   CO2 27 05/11/2017 0833   GLUCOSE 84 05/11/2017 0833   BUN 33 (H) 05/11/2017 0833   CREATININE 1.12 (H) 05/11/2017 0833   CALCIUM 9.7 05/11/2017 0833   PROT 8.1 05/11/2017 0833   ALBUMIN 3.7 05/11/2017 0833   AST 16 05/11/2017 0833   ALT 11 05/11/2017 0833   ALKPHOS 74 05/11/2017 0833   BILITOT 0.4 05/11/2017 0833   GFRNONAA 51 (L) 05/11/2017 0833   GFRAA 59 (L) 05/11/2017 0833    No results found for: TOTALPROTELP, ALBUMINELP, A1GS, A2GS, BETS, BETA2SER, GAMS, MSPIKE, SPEI  No results found for: KPAFRELGTCHN, LAMBDASER, KAPLAMBRATIO  Lab Results  Component Value Date   WBC 10.4 (H) 05/11/2017   NEUTROABS 6.9 (H) 05/11/2017   HGB 11.5 (L) 08/12/2012   HCT 37.0 05/11/2017   MCV 78.6 (L) 05/11/2017   PLT 243 05/11/2017    @LASTCHEMISTRY @  No results found for: LABCA2  No components found for: IHWTUU828  No results for input(s): INR in the last 168 hours.  No results found for: LABCA2  No results found for: MKL491  No results found for: PHX505  No results found for: WPV948  No results found for: CA2729  No components found for: HGQUANT  No results found for: CEA1 / No results found for: CEA1   No results found for: AFPTUMOR  No results found for: CHROMOGRNA  No results found for: PSA1  No visits with results within 3  Day(s) from this visit.  Latest known visit with results is:  Appointment on 05/11/2017  Component Date Value Ref Range Status  . WBC Count 05/11/2017 10.4* 3.9 - 10.3 K/uL Final  . RBC 05/11/2017 4.71  3.70 - 5.45 MIL/uL Final  . Hemoglobin 05/11/2017 11.6  11.6 - 15.9 g/dL Final  . HCT 05/11/2017 37.0  34.8 - 46.6 % Final  . MCV 05/11/2017 78.6* 79.5 - 101.0 fL Final  . MCH 05/11/2017 24.6* 25.1 - 34.0 pg Final  . MCHC 05/11/2017 31.4* 31.5 - 36.0 g/dL Final  . RDW 05/11/2017 14.7* 11.2 - 14.5 % Final  . Platelet Count 05/11/2017 243  145 - 400 K/uL Final  . Neutrophils Relative % 05/11/2017 67  % Final  . Neutro Abs 05/11/2017 6.9* 1.5 - 6.5 K/uL Final  . Lymphocytes Relative 05/11/2017 22  % Final  . Lymphs Abs 05/11/2017 2.3  0.9 - 3.3 K/uL Final  . Monocytes Relative 05/11/2017 9  % Final  . Monocytes Absolute 05/11/2017 1.0* 0.1 - 0.9 K/uL Final  . Eosinophils Relative 05/11/2017 2  % Final  . Eosinophils Absolute 05/11/2017 0.2  0.0 - 0.5 K/uL Final  . Basophils Relative 05/11/2017 0  % Final  . Basophils Absolute 05/11/2017 0.0  0.0 - 0.1 K/uL Final   Performed at [facility], [place] [address] [place], [place] [address]  . Sodium 05/11/2017 141  136 - 145 mmol/L Final  . Potassium 05/11/2017 3.7  3.5 - 5.1 mmol/L Final  . Chloride 05/11/2017 103  98 - 109 mmol/L Final  . CO2 05/11/2017 27  22 - 29 mmol/L Final  . Glucose, Bld 05/11/2017 84  70 - 140 mg/dL Final  . BUN 05/11/2017 33* 7 - 26 mg/dL Final  . Creatinine 05/11/2017 1.12* 0.60 - 1.10 mg/dL Final  . Calcium 05/11/2017 9.7  8.4 - 10.4 mg/dL Final  . Total Protein 05/11/2017  8.1  6.4 - 8.3 g/dL Final  . Albumin 05/11/2017 3.7  3.5 - 5.0 g/dL Final  . AST 05/11/2017 16  5 - 34 U/L Final  . ALT 05/11/2017 11  0 - 55 U/L Final  . Alkaline Phosphatase 05/11/2017 74  40 - 150 U/L Final  . Total Bilirubin 05/11/2017 0.4  0.2 - 1.2 mg/dL Final  . GFR, Est Non Af Am 05/11/2017 51* >60 mL/min  Final  . GFR, Est AFR Am 05/11/2017 59* >60 mL/min Final   Comment: (NOTE) The eGFR has been calculated using the CKD EPI equation. This calculation has not been validated in all clinical situations. eGFR's persistently <60 mL/min signify possible Chronic Kidney Disease.   [name] Hahn gap 05/11/2017 11  3 - 11 Final   Performed at [facility], [place] [address], [place], [place] [address]    (this displays the last labs from the last 3 days)  No results found for: TOTALPROTELP, ALBUMINELP, A1GS, A2GS, BETS, BETA2SER, GAMS, MSPIKE, SPEI (this displays SPEP labs)  No results found for: KPAFRELGTCHN, LAMBDASER, KAPLAMBRATIO (kappa/lambda light chains)  No results found for: HGBA, HGBA2QUANT, HGBFQUANT, HGBSQUAN (Hemoglobinopathy evaluation)   No results found for: LDH  No results found for: IRON, TIBC, IRONPCTSAT (Iron and TIBC)  No results found for: FERRITIN  Urinalysis No results found for: COLORURINE, APPEARANCEUR, LABSPEC, PHURINE, GLUCOSEU, HGBUR, BILIRUBINUR, KETONESUR, PROTEINUR, UROBILINOGEN, NITRITE, LEUKOCYTESUR   STUDIES: Ct Chest W Contrast  Result Date: 05/18/2017 CLINICAL DATA:  Newly diagnosed right lower outer quadrant breast carcinoma. Staging. EXAM: CT CHEST WITH CONTRAST TECHNIQUE: Multidetector CT imaging of the chest was performed during intravenous contrast administration. CONTRAST:  68m ISOVUE-300 IOPAMIDOL (ISOVUE-300) INJECTION 61% COMPARISON:  None. FINDINGS: Cardiovascular: No acute findings. Aortic and coronary artery atherosclerosis. Mediastinum/Nodes: Mild right axillary and subpectoral lymphadenopathy, largest lymph node in right axilla measuring 11 mm on image 38/2. 3 cm right breast soft tissue mass is seen with overlying skin thickening, consistent with known primary breast carcinoma. No mediastinal or hilar lymphadenopathy identified. Lungs/Pleura: No pulmonary infiltrate or mass identified. No effusion present. Upper Abdomen:   Unremarkable. Musculoskeletal:  No suspicious bone lesions. IMPRESSION: 3 cm right breast soft tissue mass with overlying skin thickening, consistent with known primary breast carcinoma. Mild right axillary and subpectoral lymphadenopathy. No other sites of metastatic disease within the thorax. Aortic Atherosclerosis (ICD10-I70.0). Coronary artery calcification. Electronically Signed   By: JEarle GellM.D.   On: 05/18/2017 11:46   Nm Bone Scan Whole Body  Result Date: 05/18/2017 CLINICAL DATA:  Breast cancer, no bone pain or recent trauma EXAM: NUCLEAR MEDICINE WHOLE BODY BONE SCAN TECHNIQUE: Whole body anterior and posterior images were obtained approximately 3 hours after intravenous injection of radiopharmaceutical. RADIOPHARMACEUTICALS:  20.7 mCi Technetium-941mDP IV COMPARISON:  None Radiographic correlation: CT chest 05/18/2017 FINDINGS: Uptake at the shoulders, knees especially RIGHT, and feet, typically degenerative. No abnormal osseous tracer accumulation is identified which is suspicious for osseous metastatic disease. Minimal nonspecific tracer localization within the breast greater on RIGHT. Urinary tract and soft tissue distribution of tracer is otherwise unremarkable. IMPRESSION: No scintigraphic evidence of osseous metastatic disease. Electronically Signed   By: MaLavonia Dana.D.   On: 05/18/2017 14:35   Mr Breast Bilateral W Wo Contrast Inc Cad  Result Date: 05/18/2017 CLINICAL DATA:  [age]ear old female newly diagnosed with right breast cancer metastatic to the right axillary lymph node, presenting for initial imaging prior to initiation of chemotherapy. The patient was diagnosed in  January of 2019 with a triple negative breast cancer in the right breast. She has family history of breast cancer in 2 sisters who were diagnosed at age 22 and 44. LABS:  Creatinine of 1.1 to mg/dl and GFR of 59 on 05/11/2017. EXAM: BILATERAL BREAST MRI WITH AND WITHOUT CONTRAST TECHNIQUE: Multiplanar,  multisequence MR images of both breasts were obtained prior to and following the intravenous administration of 18 ml of MultiHance. THREE-DIMENSIONAL MR IMAGE RENDERING ON INDEPENDENT WORKSTATION: Three-dimensional MR images were rendered by post-processing of the original MR data on an independent workstation. The three-dimensional MR images were interpreted, and findings are reported in the following complete MRI report for this study. Three dimensional images were evaluated at the independent DynaCad workstation COMPARISON:  No prior MRI available for comparison. Correlation made with prior ultrasounds and mammograms. FINDINGS: Breast composition: b. Scattered fibroglandular tissue. Background parenchymal enhancement: Minimal. Right breast: There is an irregular enhancing mass in the central to lower outer quadrant of the right breast which in maximal size in an oblique plane measures up to 6.8 cm. Susceptibility artifact is seen centrally within the mass consistent with the biopsy marking clip. In the true axial plane the measurement of the mass in the anterior to posterior and medial to lateral dimensions are 5.9 x 3.1 cm (measured on the MIP images), and in the sagittal plane, the mass measures 6.2 cm. These measurements include both the dominant mass and the smaller satellite lesion (seen on prior ultrasound) which is along the anterolateral margin of the dominant mass. The mass extends immediately deep to the right nipple, and there is associated nipple retraction, as well as edema and thickening of the periareolar skin. No abnormal enhancement is identified within the skin itself. Left breast: No mass or abnormal enhancement. Lymph nodes: Biopsy changes are seen at an enlarged lymph node in the right axilla, which demonstrated metastatic disease on pathology. No other suspicious right axillary lymph nodes or left axillary lymph nodes are identified. There is an 1.0 cm probable lymph node (measured in the  sagittal plane) along the superior aspect of the internal mammary chain (ser 6, img 28 and ser 104, img 189). At the same level, there is a 5-6 mm probable internal mammary lymph node on the left (measured in sagittal view - ser 6, img 30). Ancillary findings:  None. IMPRESSION: 1. The biopsy proven triple negative cancer in the lower outer right breast measures up to 6.8 cm in an oblique plane by MRI. There are no additional sites of disease identified in the right breast. 2. The biopsy-proven metastatic lymph node is seen in the right axilla. No additional clearly abnormal right axillary lymph nodes are identified. 3. There is a probable 1 cm internal mammary lymph node on the right, slightly asymmetric to the contralateral side which has a 0.5-0.6 cm probable internal mammary lymph node at a similar location on the left. 4.  No evidence of malignancy in the left breast. RECOMMENDATION: Continue treatment plan. BI-RADS CATEGORY  6: Known biopsy-proven malignancy. Electronically Signed   By: Ammie Ferrier M.D.   On: 05/18/2017 09:27   Dg Chest Port 1 View  Result Date: 05/19/2017 CLINICAL DATA:  Port-A-Cath insertion. EXAM: PORTABLE CHEST 1 VIEW COMPARISON:  CT 05/17/2017. FINDINGS: Port-A-Cath noted with tip over the superior vena cava. Cardiomegaly with normal pulmonary vascularity. No focal infiltrate. No pleural effusion or pneumothorax. No acute bony abnormality. IMPRESSION: 1.  Port-A-Cath noted with tip over superior vena cava. 2. Cardiomegaly with  normal pulmonary vascularity. No acute infiltrate. Electronically Signed   By: Marcello Moores  Register   On: 05/19/2017 12:21   Dg Fluoro Guide Cv Line-no Report  Result Date: 05/19/2017 Fluoroscopy was utilized by the requesting physician.  No radiographic interpretation.    ELIGIBLE FOR AVAILABLE RESEARCH PROTOCOL: [facility] SWOG [phone]; not a candidate for breast MRI study given history of claustrophobia  ASSESSMENT: 62 y.o. [place] Woman status post  right breast upper outer quadrant biopsy 05/02/2017 for a clinical T2 N1-2, stage IIIB invasive ductal carcinoma, grade 2-3, triple negative, with an MIB-180%  (a) breast MRI 05/18/2017 shows T3 N1 disease with possible involvement of the internal mammary nodes  (1) neoadjuvant chemotherapy to consist of doxorubicin and cyclophosphamide in dose dense fashion x4 starting 05/24/2017 to be  followed by carboplatin and paclitaxel weekly x12  (2) definitive surgery to follow  (3) consider SWOG [id number] depending on final pathology results  (4) adjuvant radiation to follow  (5) genetics testing pending  PLAN: Yarelli has good heart function, she underwent port placement without event, and she came to chemotherapy school where she learned about possible side effects toxicities and complications of chemotherapy.  That was reviewed today.  She had staging studies including a CT of the chest and a bone scan showing no evidence of metastatic disease.  We discussed those at length.  She will start her chemotherapy next week.  I gave her a "roadmap" on how to take her supportive medications.  All the scripts have been placed and she received a handwritten order for lorazepam.  She also received a prescription for cranial prostheses.  She will see [place] again a week after her first dose of chemo.  I would like to be involved in that visit so we can review tolerance.  She will keep a symptom diary to assist.  Otherwise I will see her again with cycle 2  She knows to call for any problems that may develop before that visit.   Maddi Collar, Virgie Dad, MD  05/20/17 2:01 PM Medical Oncology and Hematology [facility] [address] [place],  [address] Tel. [phone]    Fax. [phone]  This document serves as a record of services personally performed by Lurline Del, MD. It was created on his behalf by Sheron Nightingale, a trained medical scribe. The creation of this record is based on  the scribe's personal observations and the provider's statements to them.   I have reviewed the above documentation for accuracy and completeness, and I agree with the above.

## 2017-05-19 ENCOUNTER — Other Ambulatory Visit: Payer: Self-pay

## 2017-05-19 ENCOUNTER — Ambulatory Visit (HOSPITAL_COMMUNITY): Payer: BC Managed Care – PPO | Admitting: Anesthesiology

## 2017-05-19 ENCOUNTER — Ambulatory Visit (HOSPITAL_COMMUNITY): Payer: BC Managed Care – PPO

## 2017-05-19 ENCOUNTER — Encounter (HOSPITAL_COMMUNITY): Payer: Self-pay

## 2017-05-19 ENCOUNTER — Ambulatory Visit (HOSPITAL_COMMUNITY)
Admission: RE | Admit: 2017-05-19 | Discharge: 2017-05-19 | Disposition: A | Discharge status: Home / Self Care | Payer: BC Managed Care – PPO | Source: Ambulatory Visit | Attending: General Surgery | Admitting: General Surgery

## 2017-05-19 ENCOUNTER — Encounter (HOSPITAL_COMMUNITY)
Admission: RE | Disposition: A | Discharge status: Home / Self Care | Payer: Self-pay | Source: Ambulatory Visit | Attending: General Surgery

## 2017-05-19 DIAGNOSIS — E669 Obesity, unspecified: Secondary | ICD-10-CM | POA: Insufficient documentation

## 2017-05-19 DIAGNOSIS — Z171 Estrogen receptor negative status [ER-]: Secondary | ICD-10-CM | POA: Diagnosis not present

## 2017-05-19 DIAGNOSIS — C50111 Malignant neoplasm of central portion of right female breast: Secondary | ICD-10-CM | POA: Diagnosis present

## 2017-05-19 DIAGNOSIS — Z803 Family history of malignant neoplasm of breast: Secondary | ICD-10-CM | POA: Diagnosis not present

## 2017-05-19 DIAGNOSIS — Z79899 Other long term (current) drug therapy: Secondary | ICD-10-CM | POA: Diagnosis not present

## 2017-05-19 DIAGNOSIS — Z6833 Body mass index (BMI) 33.0-33.9, adult: Secondary | ICD-10-CM | POA: Diagnosis not present

## 2017-05-19 DIAGNOSIS — Z95828 Presence of other vascular implants and grafts: Secondary | ICD-10-CM

## 2017-05-19 DIAGNOSIS — Z7982 Long term (current) use of aspirin: Secondary | ICD-10-CM | POA: Diagnosis not present

## 2017-05-19 DIAGNOSIS — Z91040 Latex allergy status: Secondary | ICD-10-CM | POA: Insufficient documentation

## 2017-05-19 DIAGNOSIS — I10 Essential (primary) hypertension: Secondary | ICD-10-CM | POA: Insufficient documentation

## 2017-05-19 DIAGNOSIS — Z419 Encounter for procedure for purposes other than remedying health state, unspecified: Secondary | ICD-10-CM

## 2017-05-19 DIAGNOSIS — M1990 Unspecified osteoarthritis, unspecified site: Secondary | ICD-10-CM | POA: Insufficient documentation

## 2017-05-19 HISTORY — DX: Nausea with vomiting, unspecified: R11.2

## 2017-05-19 HISTORY — DX: Nausea with vomiting, unspecified: Z98.890

## 2017-05-19 HISTORY — PX: PORTACATH PLACEMENT: SHX2246

## 2017-05-19 HISTORY — DX: Headache, unspecified: R51.9

## 2017-05-19 HISTORY — DX: Headache: R51

## 2017-05-19 SURGERY — INSERTION, TUNNELED CENTRAL VENOUS DEVICE, WITH PORT
Anesthesia: General | Site: Chest | Laterality: Right

## 2017-05-19 MED ORDER — PHENYLEPHRINE 40 MCG/ML (10ML) SYRINGE FOR IV PUSH (FOR BLOOD PRESSURE SUPPORT)
PREFILLED_SYRINGE | INTRAVENOUS | Status: DC | PRN
  Administered 2017-05-19 (×2): 80 ug via INTRAVENOUS

## 2017-05-19 MED ORDER — PROPOFOL 10 MG/ML IV BOLUS
INTRAVENOUS | Status: DC | PRN
  Administered 2017-05-19: 150 mg via INTRAVENOUS

## 2017-05-19 MED ORDER — LACTATED RINGERS IV SOLN
INTRAVENOUS | Status: DC
  Administered 2017-05-19 (×2): via INTRAVENOUS

## 2017-05-19 MED ORDER — 0.9 % SODIUM CHLORIDE (POUR BTL) OPTIME
TOPICAL | Status: DC | PRN
  Administered 2017-05-19: 1000 mL

## 2017-05-19 MED ORDER — SODIUM CHLORIDE 0.9 % IV SOLN
INTRAVENOUS | Status: DC | PRN
  Administered 2017-05-19: 11:00:00

## 2017-05-19 MED ORDER — FENTANYL CITRATE (PF) 250 MCG/5ML IJ SOLN
INTRAMUSCULAR | Status: DC | PRN
  Administered 2017-05-19: 25 ug via INTRAVENOUS

## 2017-05-19 MED ORDER — MIDAZOLAM HCL 2 MG/2ML IJ SOLN
INTRAMUSCULAR | Status: DC | PRN
  Administered 2017-05-19: 2 mg via INTRAVENOUS

## 2017-05-19 MED ORDER — GABAPENTIN 300 MG PO CAPS
300.0000 mg | ORAL_CAPSULE | ORAL | Status: AC
  Administered 2017-05-19: 300 mg via ORAL
  Filled 2017-05-19: qty 1

## 2017-05-19 MED ORDER — HEPARIN SOD (PORK) LOCK FLUSH 100 UNIT/ML IV SOLN
INTRAVENOUS | Status: DC | PRN
  Administered 2017-05-19: 500 [IU]

## 2017-05-19 MED ORDER — CEFAZOLIN SODIUM-DEXTROSE 2-4 GM/100ML-% IV SOLN
INTRAVENOUS | Status: AC
  Filled 2017-05-19: qty 100

## 2017-05-19 MED ORDER — ONDANSETRON HCL 4 MG/2ML IJ SOLN
INTRAMUSCULAR | Status: DC | PRN
  Administered 2017-05-19: 4 mg via INTRAVENOUS

## 2017-05-19 MED ORDER — FENTANYL CITRATE (PF) 100 MCG/2ML IJ SOLN: 25.0000 ug | INTRAMUSCULAR | Status: DC | PRN

## 2017-05-19 MED ORDER — MIDAZOLAM HCL 2 MG/2ML IJ SOLN
INTRAMUSCULAR | Status: AC
  Filled 2017-05-19: qty 2

## 2017-05-19 MED ORDER — FAMOTIDINE 20 MG PO TABS
20.0000 mg | ORAL_TABLET | Freq: Once | ORAL | Status: AC
  Administered 2017-05-19: 20 mg via ORAL
  Filled 2017-05-19: qty 1

## 2017-05-19 MED ORDER — LIDOCAINE 2% (20 MG/ML) 5 ML SYRINGE
INTRAMUSCULAR | Status: AC
  Filled 2017-05-19: qty 5

## 2017-05-19 MED ORDER — ONDANSETRON HCL 4 MG/2ML IJ SOLN
INTRAMUSCULAR | Status: AC
  Filled 2017-05-19: qty 2

## 2017-05-19 MED ORDER — HEPARIN SOD (PORK) LOCK FLUSH 100 UNIT/ML IV SOLN
INTRAVENOUS | Status: AC
  Filled 2017-05-19: qty 5

## 2017-05-19 MED ORDER — OXYCODONE HCL 5 MG PO TABS: 5.0000 mg | ORAL_TABLET | Freq: Four times a day (QID) | ORAL | 0 refills | Status: DC | PRN

## 2017-05-19 MED ORDER — ONDANSETRON HCL 4 MG/2ML IJ SOLN: 4.0000 mg | Freq: Once | INTRAMUSCULAR | Status: DC | PRN

## 2017-05-19 MED ORDER — CEFAZOLIN SODIUM-DEXTROSE 2-4 GM/100ML-% IV SOLN
2.0000 g | INTRAVENOUS | Status: AC
  Administered 2017-05-19: 2 g via INTRAVENOUS

## 2017-05-19 MED ORDER — ENSURE PRE-SURGERY PO LIQD
592.0000 mL | Freq: Once | ORAL | Status: DC
  Filled 2017-05-19: qty 592

## 2017-05-19 MED ORDER — BUPIVACAINE HCL (PF) 0.25 % IJ SOLN
INTRAMUSCULAR | Status: DC | PRN
  Administered 2017-05-19: 9 mL

## 2017-05-19 MED ORDER — LIDOCAINE 2% (20 MG/ML) 5 ML SYRINGE
INTRAMUSCULAR | Status: DC | PRN
  Administered 2017-05-19: 80 mg via INTRAVENOUS

## 2017-05-19 MED ORDER — ACETAMINOPHEN 500 MG PO TABS
1000.0000 mg | ORAL_TABLET | ORAL | Status: AC
  Administered 2017-05-19: 1000 mg via ORAL
  Filled 2017-05-19: qty 2

## 2017-05-19 MED ORDER — BUPIVACAINE HCL (PF) 0.25 % IJ SOLN
INTRAMUSCULAR | Status: AC
Start: 2017-05-19 — End: ?
  Filled 2017-05-19: qty 20

## 2017-05-19 MED ORDER — FENTANYL CITRATE (PF) 250 MCG/5ML IJ SOLN
INTRAMUSCULAR | Status: AC
  Filled 2017-05-19: qty 5

## 2017-05-19 MED ORDER — PHENYLEPHRINE 40 MCG/ML (10ML) SYRINGE FOR IV PUSH (FOR BLOOD PRESSURE SUPPORT)
PREFILLED_SYRINGE | INTRAVENOUS | Status: AC
  Filled 2017-05-19: qty 10

## 2017-05-19 MED ORDER — PROPOFOL 10 MG/ML IV BOLUS
INTRAVENOUS | Status: AC
  Filled 2017-05-19: qty 20

## 2017-05-19 MED ORDER — DEXAMETHASONE SODIUM PHOSPHATE 10 MG/ML IJ SOLN
INTRAMUSCULAR | Status: AC
  Filled 2017-05-19: qty 1

## 2017-05-19 MED ORDER — DEXAMETHASONE SODIUM PHOSPHATE 10 MG/ML IJ SOLN
INTRAMUSCULAR | Status: DC | PRN
  Administered 2017-05-19: 10 mg via INTRAVENOUS

## 2017-05-19 SURGICAL SUPPLY — 50 items
ADH SKN CLS APL DERMABOND .7 (GAUZE/BANDAGES/DRESSINGS) ×1
BAG DECANTER FOR FLEXI CONT (MISCELLANEOUS) ×2 IMPLANT
BLADE SURG 11 STRL SS (BLADE) ×2 IMPLANT
BLADE SURG 15 STRL LF DISP TIS (BLADE) ×1 IMPLANT
BLADE SURG 15 STRL SS (BLADE) ×2
CHLORAPREP W/TINT 26ML (MISCELLANEOUS) ×2 IMPLANT
COVER PROBE W GEL 5X96 (DRAPES) ×1 IMPLANT
COVER SURGICAL LIGHT HANDLE (MISCELLANEOUS) ×2 IMPLANT
COVER TRANSDUCER ULTRASND GEL (DRAPE) ×2 IMPLANT
CRADLE DONUT ADULT HEAD (MISCELLANEOUS) ×2 IMPLANT
DECANTER SPIKE VIAL GLASS SM (MISCELLANEOUS) ×2 IMPLANT
DERMABOND ADVANCED (GAUZE/BANDAGES/DRESSINGS) ×1
DERMABOND ADVANCED .7 DNX12 (GAUZE/BANDAGES/DRESSINGS) ×1 IMPLANT
DRAPE C-ARM 42X72 X-RAY (DRAPES) ×2 IMPLANT
DRAPE CHEST BREAST 15X10 FENES (DRAPES) ×2 IMPLANT
DRAPE UTILITY XL STRL (DRAPES) ×2 IMPLANT
ELECT CAUTERY BLADE 6.4 (BLADE) ×2 IMPLANT
ELECT REM PT RETURN 9FT ADLT (ELECTROSURGICAL) ×2
ELECTRODE REM PT RTRN 9FT ADLT (ELECTROSURGICAL) ×1 IMPLANT
GAUZE SPONGE 4X4 16PLY XRAY LF (GAUZE/BANDAGES/DRESSINGS) ×4 IMPLANT
GEL ULTRASOUND 20GR AQUASONIC (MISCELLANEOUS) ×2 IMPLANT
GLOVE BIO SURGEON STRL SZ7 (GLOVE) ×2 IMPLANT
GLOVE BIOGEL PI IND STRL 7.5 (GLOVE) ×1 IMPLANT
GLOVE BIOGEL PI INDICATOR 7.5 (GLOVE) ×1
GOWN STRL REUS W/ TWL LRG LVL3 (GOWN DISPOSABLE) ×2 IMPLANT
GOWN STRL REUS W/TWL LRG LVL3 (GOWN DISPOSABLE) ×4
INTRODUCER COOK 11FR (CATHETERS) IMPLANT
KIT BASIN OR (CUSTOM PROCEDURE TRAY) ×2 IMPLANT
KIT PORT POWER 8FR ISP CVUE (Miscellaneous) ×1 IMPLANT
KIT ROOM TURNOVER OR (KITS) ×2 IMPLANT
NDL HYPO 25GX1X1/2 BEV (NEEDLE) ×1 IMPLANT
NEEDLE HYPO 25GX1X1/2 BEV (NEEDLE) ×2 IMPLANT
NS IRRIG 1000ML POUR BTL (IV SOLUTION) ×2 IMPLANT
PACK SURGICAL SETUP 50X90 (CUSTOM PROCEDURE TRAY) ×2 IMPLANT
PAD ARMBOARD 7.5X6 YLW CONV (MISCELLANEOUS) ×4 IMPLANT
PENCIL BUTTON HOLSTER BLD 10FT (ELECTRODE) ×2 IMPLANT
SET INTRODUCER 12FR PACEMAKER (SHEATH) IMPLANT
SET SHEATH INTRODUCER 10FR (MISCELLANEOUS) IMPLANT
SHEATH COOK PEEL AWAY SET 9F (SHEATH) IMPLANT
SUT MNCRL AB 4-0 PS2 18 (SUTURE) ×2 IMPLANT
SUT PROLENE 2 0 SH DA (SUTURE) ×2 IMPLANT
SUT SILK 2 0 (SUTURE)
SUT SILK 2-0 18XBRD TIE 12 (SUTURE) IMPLANT
SUT VIC AB 3-0 SH 27 (SUTURE) ×2
SUT VIC AB 3-0 SH 27XBRD (SUTURE) ×1 IMPLANT
SYR 20ML ECCENTRIC (SYRINGE) ×4 IMPLANT
SYR 5ML LUER SLIP (SYRINGE) ×2 IMPLANT
SYR CONTROL 10ML LL (SYRINGE) IMPLANT
TOWEL OR 17X24 6PK STRL BLUE (TOWEL DISPOSABLE) ×2 IMPLANT
TOWEL OR 17X26 10 PK STRL BLUE (TOWEL DISPOSABLE) ×2 IMPLANT

## 2017-05-19 NOTE — Discharge Instructions (Signed)
    PORT-A-CATH: POST OP INSTRUCTIONS  Always review your discharge instruction sheet given to you by the facility where your surgery was performed.   1. A prescription for pain medication may be given to you upon discharge. Take your pain medication as prescribed, if needed. If narcotic pain medicine is not needed, then you make take acetaminophen (Tylenol) or ibuprofen (Advil) as needed.  2. Take your usually prescribed medications unless otherwise directed. 3. If you need a refill on your pain medication, please contact our office. All narcotic pain medicine now requires a paper prescription.  Phoned in and fax refills are no longer allowed by law.  Prescriptions will not be filled after 5 pm or on weekends.  4. You should follow a light diet for the remainder of the day after your procedure. 5. Most patients will experience some mild swelling and/or bruising in the area of the incision. It may take several days to resolve. 6. It is common to experience some constipation if taking pain medication after surgery. Increasing fluid intake and taking a stool softener (such as Colace) will usually help or prevent this problem from occurring. A mild laxative (Milk of Magnesia or Miralax) should be taken according to package directions if there are no bowel movements after 48 hours.  7. Unless discharge instructions indicate otherwise, you may remove your bandages 48 hours after surgery, and you may shower at that time. You may have steri-strips (small white skin tapes) in place directly over the incision.  These strips should be left on the skin for 7-10 days.  If your surgeon used Dermabond (skin glue) on the incision, you may shower in 24 hours.  The glue will flake off over the next 2-3 weeks.  8. If your port is left accessed at the end of surgery (needle left in port), the dressing cannot get wet and should only by changed by a healthcare professional. When the port is no longer accessed (when the  needle has been removed), follow step 7.   9. ACTIVITIES:  Limit activity involving your arms for the next 72 hours. Do no strenuous exercise or activity for 1 week. You may drive when you are no longer taking prescription pain medication, you can comfortably wear a seatbelt, and you can maneuver your car. 10.You may need to see your doctor in the office for a follow-up appointment.  Please       check with your doctor.  11.When you receive a new Port-a-Cath, you will get a product guide and        ID card.  Please keep them in case you need them.  WHEN TO CALL YOUR DOCTOR ([phone]): 1. Fever over 101.0 2. Chills 3. Continued bleeding from incision 4. Increased redness and tenderness at the site 5. Shortness of breath, difficulty breathing   The clinic staff is available to answer your questions during regular business hours. Please don't hesitate to call and ask to speak to one of the nurses or medical assistants for clinical concerns. If you have a medical emergency, go to the nearest emergency room or call 911.  A surgeon from [facility] is always on call at the hospital.     For further information, please visit [email or web address]      

## 2017-05-19 NOTE — Interval H&P Note (Signed)
History and Physical Interval Note:  05/19/2017 10:39 AM  Emily Perez  has presented today for surgery, with the diagnosis of breast cancer  The various methods of treatment have been discussed with the patient and family. After consideration of risks, benefits and other options for treatment, the patient has consented to  Procedure(s) with comments: INSERTION PORT-A-CATH WITH ULTRASOUND (N/A) - general with lma as a surgical intervention .  The patient's history has been reviewed, patient examined, no change in status, stable for surgery.  I have reviewed the patient's chart and labs.  Questions were answered to the patient's satisfaction.     Rolm Bookbinder

## 2017-05-19 NOTE — Op Note (Signed)
Preoperative diagnosis: triple negative breast cancer. Clinical stage II Postoperative diagnosis: same as above Procedure: right ij US guided powerport insertion Surgeon: Dr Serita Grammes EBL: minimal Anes: general  Specimens none Complications none Drains none Sponge count correct Dispo to pacu stable  Indications: This is a16 yof who has triple negative breast cancer.  We discussed port placement.   Procedure: After informed consent was obtained the patient was taken to the operating room. She was given antibiotics. Sequential compression devices were on her legs. She was then placed under general anesthesia with an LMA. Then she was prepped and draped in the standard sterile surgical fashion. Surgical timeout was then performed.  I used the ultrasound to identify the right internal jugular vein. I then accessed the vein using the ultrasound.This aspirated blood. I then placed the wire. This was confirmed by fluoroscopy and ultrasound to be in the correct position.I tunneled the line between the 2 sites.I then dilated the tract and placed the dilator assembly with the sheath. This was done under fluoroscopy. I then removed the sheath and dilator. The wire was also removed. The line was then pulled back to be in the venacava. I hooked this up to the port. I sutured this into place with 2-0 Prolene in 2 places. This aspirated blood and flushed easily.This was confirmed with a final fluoroscopy. I then closed this with 2-0 Vicryl and 4-0 Monocryl.This withdrew blood and I placed heparin in it. Dermabond was placed on both the incisions.A dressing was placed. She tolerated this well and was transferred to the recovery room in stable condition

## 2017-05-19 NOTE — Anesthesia Preprocedure Evaluation (Addendum)
Anesthesia Evaluation  Patient identified by MRN, date of birth, ID band Patient awake    Reviewed: Allergy & Precautions, NPO status , Patient's Chart, lab work & pertinent test results  History of Anesthesia Complications (+) PONV and history of anesthetic complications  Airway Mallampati: I  TM Distance: >3 FB Neck ROM: Full    Dental no notable dental hx.    Pulmonary neg pulmonary ROS,    Pulmonary exam normal breath sounds clear to auscultation       Cardiovascular hypertension, Pt. on medications Normal cardiovascular exam Rhythm:Regular Rate:Normal  ECG: NSR, rate 73  ECHO: LV EF: 55% - 60%   Neuro/Psych  Headaches, negative psych ROS   GI/Hepatic negative GI ROS, Neg liver ROS,   Endo/Other  negative endocrine ROS  Renal/GU negative Renal ROS     Musculoskeletal negative musculoskeletal ROS (+)   Abdominal (+) + obese,   Peds  Hematology negative hematology ROS (+)   Anesthesia Other Findings breast cancer  Reproductive/Obstetrics                            Anesthesia Physical Anesthesia Plan  ASA: II  Anesthesia Plan: General   Post-op Pain Management:    Induction: Intravenous  PONV Risk Score and Plan: 3 and Ondansetron, Dexamethasone, Midazolam and Treatment may vary due to age or medical condition  Airway Management Planned: LMA  Additional Equipment:   Intra-op Plan:   Post-operative Plan: Extubation in OR  Informed Consent: I have reviewed the patients History and Physical, chart, labs and discussed the procedure including the risks, benefits and alternatives for the proposed anesthesia with the patient or authorized representative who has indicated his/her understanding and acceptance.   Dental advisory given  Plan Discussed with: CRNA  Anesthesia Plan Comments:         Anesthesia Quick Evaluation

## 2017-05-19 NOTE — Anesthesia Procedure Notes (Signed)
Procedure Name: LMA Insertion Date/Time: 05/19/2017 10:58 AM Performed by: Harden Mo, CRNA Pre-anesthesia Checklist: Patient identified, Emergency Drugs available, Suction available and Patient being monitored Patient Re-evaluated:Patient Re-evaluated prior to induction Oxygen Delivery Method: Circle System Utilized Preoxygenation: Pre-oxygenation with 100% oxygen Induction Type: IV induction LMA: LMA inserted LMA Size: 4.0 Number of attempts: 1 Airway Equipment and Method: Bite block Placement Confirmation: positive ETCO2 Tube secured with: Tape Dental Injury: Teeth and Oropharynx as per pre-operative assessment

## 2017-05-19 NOTE — Transfer of Care (Signed)
Immediate Anesthesia Transfer of Care Note  Patient: Emily Perez  Procedure(s) Performed: INSERTION PORT-A-CATH WITH ULTRASOUND (Right Chest)  Patient Location: PACU  Anesthesia Type:General  Level of Consciousness: awake and alert   Airway & Oxygen Therapy: Patient Spontanous Breathing  Post-op Assessment: Report given to RN, Post -op Vital signs reviewed and stable and Patient moving all extremities X 4  Post vital signs: Reviewed and stable  Last Vitals:  Vitals:   05/19/17 0917  BP: 130/84  Pulse: 70  Resp: 18  Temp: 36.7 C  SpO2: 100%    Last Pain:  Vitals:   05/19/17 0917  TempSrc: Oral         Complications: No apparent anesthesia complications

## 2017-05-19 NOTE — H&P (Signed)
74 yof referred by Dr Kenton Kingfisher for new right breast cancer. she has no prior breast history. she lives in [place]. she did not have a mass or dc. she has fh of 2 sisters with breast cancer at ages 21 and 20. she has recently retired. she is here with her daughter today. she was scheduled for rc repair in a couple weeks. she was noted to have screening detected right breast mass. she has 2.5 cm mass in retroareolar position on mm and [place]. there are 3 abnl nodes in right axillay. the node is positive on core and the breast is an at least grade II IDC that is TN. she is here today to discuss options.    Past Surgical History Tawni Pummel, RN; 05/11/2017 7:28 AM) Breast Biopsy  Right. Foot Surgery  Bilateral. Oral Surgery   Diagnostic Studies History Tawni Pummel, RN; 05/11/2017 7:28 AM) Colonoscopy  1-5 years ago Mammogram  within last year Pap Smear  1-5 years ago  Medication History Tawni Pummel, RN; 05/11/2017 7:28 AM) Medications Reconciled  Social History Tawni Pummel, RN; 05/11/2017 7:28 AM) Alcohol use  Occasional alcohol use. Caffeine use  Carbonated beverages. No drug use  Tobacco use  Never smoker.  Family History Tawni Pummel, RN; 05/11/2017 7:28 AM) Alcohol Abuse  Family Members In General, Father. Arthritis  Brother, Family Members In General, Father, Mother, Sister. Breast Cancer  Family Members In General, Sister. Colon Polyps  Brother. Diabetes Mellitus  Brother, Family Members In General, Mother, Sister. Heart Disease  Brother, Mother, Sister. Heart disease in female family member before age 50  Hypertension  Brother, Family Members In [place], Father, Sister. Kidney Disease  Brother, Family Members In [place], Father. Migraine Headache  Son. Prostate Cancer  Brother. Respiratory Condition  Father, Sister. Thyroid problems  Brother.  Pregnancy / Birth History Tawni Pummel, RN; 05/11/2017 7:28 AM) Age at menarche  2  years. Age of menopause  51-53 Contraceptive History  Intrauterine device, Oral contraceptives. Gravida  2 Length (months) of breastfeeding  3-6 Maternal age  17-25 Para  2  Other Problems Tawni Pummel, RN; 05/11/2017 7:28 AM) Arthritis  Back Pain  Bladder Problems  Breast Cancer  Hemorrhoids  High blood pressure  Lump In Breast  Migraine Headache     Review of Systems Sunday Spillers Ledford RN; 05/11/2017 7:28 AM) General Present- Appetite Loss. Not Present- Chills, Fatigue, Fever, Night Sweats, Weight Gain and Weight Loss. Skin Present- Dryness and Rash. Not Present- Change in Wart/Mole, Hives, Jaundice, New Lesions, Non-Healing Wounds and Ulcer. HEENT Present- Earache, Nose Bleed and Wears glasses/contact lenses. Not Present- Hearing Loss, Hoarseness, Oral Ulcers, Ringing in the Ears, Seasonal Allergies, Sinus Pain, Sore Throat, Visual Disturbances and Yellow Eyes. Respiratory Present- Snoring. Not Present- Bloody sputum, Chronic Cough, Difficulty Breathing and Wheezing. Breast Not Present- Breast Mass, Breast Pain, Nipple Discharge and Skin Changes. Cardiovascular Not Present- Chest Pain, Difficulty Breathing Lying Down, Leg Cramps, Palpitations, Rapid Heart Rate, Shortness of Breath and Swelling of Extremities. Gastrointestinal Not Present- Abdominal Pain, Bloating, Bloody Stool, Change in Bowel Habits, Chronic diarrhea, Constipation, Difficulty Swallowing, Excessive gas, Gets full quickly at meals, Hemorrhoids, Indigestion, Nausea, Rectal Pain and Vomiting. Female Genitourinary Present- Frequency and Urgency. Not Present- Nocturia, Painful Urination and Pelvic Pain. Musculoskeletal Present- Back Pain, Joint Pain, Joint Stiffness, Muscle Pain and Muscle Weakness. Not Present- Swelling of Extremities. Neurological Present- Trouble walking and Weakness. Not Present- Decreased Memory, Fainting, Headaches, Numbness, Seizures, Tingling and Tremor. Psychiatric Not Present-  Anxiety, Bipolar, Change in  Sleep Pattern, Depression, Fearful and Frequent crying. Endocrine Not Present- Cold Intolerance, Excessive Hunger, Hair Changes, Heat Intolerance, Hot flashes and New Diabetes. Hematology Present- Blood Thinners and Easy Bruising. Not Present- Excessive bleeding, Gland problems, HIV and Persistent Infections.   Physical Exam Rolm Bookbinder MD; 05/11/2017 2:52 PM) General Mental Status-Alert.  Eye Sclera/Conjunctiva - Bilateral-No scleral icterus.  Chest and Lung Exam Chest and lung exam reveals -quiet, even and easy respiratory effort with no use of accessory muscles and on auscultation, normal breath sounds, no adventitious sounds and normal vocal resonance.  Breast Nipples-No Discharge. Breast Lump-No Palpable Breast Mass. Note: right breast hematoma from biopsy   Cardiovascular Cardiovascular examination reveals -normal heart sounds, regular rate and rhythm with no murmurs.  Abdomen Note: soft nt   Neurologic Neurologic evaluation reveals -alert and oriented x 3 with no impairment of recent or remote memory.  Lymphatic Head & Neck  General Head & Neck Lymphatics: Bilateral - Description - Normal. Axillary  General Axillary Region: Bilateral - Description - Normal. Note: no [place] adenopathy     Assessment & Plan Rolm Bookbinder MD; 05/11/2017 2:56 PM) CANCER OF CENTRAL PORTION OF BREAST (C50.119) Story: MRI, genetics, port placement for primary chemotherapy she has node positive tnbc. we discussed all treatments and options. I recommended beginning with primary chemotherapy to both possibly reduce nodal surgery and facilitate lumpectomy if she desires as well as to gauge response to this tumor. we discussed port placement today which I will proceed with soon to begin chemotherapy. we discussed eventual surgery with lumpectomy vs mastectomy and pros/cons of both of those as well as full alnd vs a possible targeted alnd  depending on response. we would decide that with post chemo mri and exam. she understands and all questions answered

## 2017-05-20 ENCOUNTER — Telehealth: Payer: Self-pay | Admitting: Oncology

## 2017-05-20 ENCOUNTER — Encounter (HOSPITAL_COMMUNITY): Payer: Self-pay | Admitting: General Surgery

## 2017-05-20 ENCOUNTER — Inpatient Hospital Stay: Payer: BC Managed Care – PPO | Attending: Oncology | Admitting: Oncology

## 2017-05-20 VITALS — BP 136/72 | HR 92 | Temp 97.9°F | Resp 18 | Ht 63.5 in | Wt 189.8 lb

## 2017-05-20 DIAGNOSIS — Z803 Family history of malignant neoplasm of breast: Secondary | ICD-10-CM | POA: Diagnosis not present

## 2017-05-20 DIAGNOSIS — R110 Nausea: Secondary | ICD-10-CM | POA: Insufficient documentation

## 2017-05-20 DIAGNOSIS — B370 Candidal stomatitis: Secondary | ICD-10-CM | POA: Diagnosis not present

## 2017-05-20 DIAGNOSIS — E860 Dehydration: Secondary | ICD-10-CM | POA: Insufficient documentation

## 2017-05-20 DIAGNOSIS — I700 Atherosclerosis of aorta: Secondary | ICD-10-CM | POA: Insufficient documentation

## 2017-05-20 DIAGNOSIS — Z5189 Encounter for other specified aftercare: Secondary | ICD-10-CM | POA: Insufficient documentation

## 2017-05-20 DIAGNOSIS — C773 Secondary and unspecified malignant neoplasm of axilla and upper limb lymph nodes: Secondary | ICD-10-CM | POA: Diagnosis not present

## 2017-05-20 DIAGNOSIS — C50411 Malignant neoplasm of upper-outer quadrant of right female breast: Secondary | ICD-10-CM | POA: Insufficient documentation

## 2017-05-20 DIAGNOSIS — Z171 Estrogen receptor negative status [ER-]: Secondary | ICD-10-CM | POA: Diagnosis not present

## 2017-05-20 DIAGNOSIS — Z5111 Encounter for antineoplastic chemotherapy: Secondary | ICD-10-CM | POA: Diagnosis present

## 2017-05-20 DIAGNOSIS — C50511 Malignant neoplasm of lower-outer quadrant of right female breast: Secondary | ICD-10-CM

## 2017-05-20 MED ORDER — DEXAMETHASONE 4 MG PO TABS: ORAL_TABLET | ORAL | 1 refills | Status: DC

## 2017-05-20 MED ORDER — LIDOCAINE-PRILOCAINE 2.5-2.5 % EX CREA: TOPICAL_CREAM | CUTANEOUS | 3 refills | Status: DC

## 2017-05-20 MED ORDER — PROCHLORPERAZINE MALEATE 10 MG PO TABS: 10.0000 mg | ORAL_TABLET | Freq: Four times a day (QID) | ORAL | 1 refills | Status: DC | PRN

## 2017-05-20 MED ORDER — LORAZEPAM 0.5 MG PO TABS: 0.5000 mg | ORAL_TABLET | Freq: Every evening | ORAL | 0 refills | Status: DC | PRN

## 2017-05-20 NOTE — Telephone Encounter (Signed)
Called patient regarding injections

## 2017-05-20 NOTE — Anesthesia Postprocedure Evaluation (Signed)
Anesthesia Post Note  Patient: Emily Perez  Procedure(s) Performed: INSERTION PORT-A-CATH WITH ULTRASOUND (Right Chest)     Patient location during evaluation: PACU Anesthesia Type: General Level of consciousness: awake and alert Pain management: pain level controlled Vital Signs Assessment: post-procedure vital signs reviewed and stable Respiratory status: spontaneous breathing, nonlabored ventilation, respiratory function stable and patient connected to nasal cannula oxygen Cardiovascular status: blood pressure returned to baseline and stable Postop Assessment: no apparent nausea or vomiting Anesthetic complications: no    Last Vitals:  Vitals:   05/19/17 1217 05/19/17 1245  BP: (!) 153/94 (!) 145/90  Pulse: 61 (!) 58  Resp: 13 15  Temp:  (!) 36.4 C  SpO2: 100% 98%    Last Pain:  Vitals:   05/19/17 1215  TempSrc:   PainSc: Asleep                 [name] P Noha Milberger

## 2017-05-23 ENCOUNTER — Telehealth: Payer: Self-pay

## 2017-05-23 ENCOUNTER — Telehealth: Payer: Self-pay | Admitting: Oncology

## 2017-05-23 ENCOUNTER — Other Ambulatory Visit: Payer: Self-pay

## 2017-05-23 DIAGNOSIS — C50511 Malignant neoplasm of lower-outer quadrant of right female breast: Secondary | ICD-10-CM

## 2017-05-23 DIAGNOSIS — Z171 Estrogen receptor negative status [ER-]: Secondary | ICD-10-CM

## 2017-05-23 NOTE — Telephone Encounter (Signed)
Spoke with patient concerning scheduled appointment times. Per 3/4 los will get schedule tomorrow

## 2017-05-23 NOTE — Telephone Encounter (Signed)
Per 3/1 los already done

## 2017-05-24 ENCOUNTER — Inpatient Hospital Stay: Payer: BC Managed Care – PPO

## 2017-05-24 ENCOUNTER — Telehealth: Payer: Self-pay | Admitting: *Deleted

## 2017-05-24 ENCOUNTER — Encounter: Payer: Self-pay | Admitting: Oncology

## 2017-05-24 VITALS — BP 121/74 | HR 75 | Temp 98.0°F | Resp 18

## 2017-05-24 DIAGNOSIS — C50511 Malignant neoplasm of lower-outer quadrant of right female breast: Secondary | ICD-10-CM

## 2017-05-24 DIAGNOSIS — Z171 Estrogen receptor negative status [ER-]: Secondary | ICD-10-CM

## 2017-05-24 DIAGNOSIS — Z5111 Encounter for antineoplastic chemotherapy: Secondary | ICD-10-CM | POA: Diagnosis not present

## 2017-05-24 LAB — CBC WITH DIFFERENTIAL (CANCER CENTER ONLY)
Basophils Absolute: 0.1 10*3/uL (ref 0.0–0.1)
Basophils Relative: 1 %
Eosinophils Absolute: 0.2 10*3/uL (ref 0.0–0.5)
Eosinophils Relative: 2 %
HCT: 36.1 % (ref 34.8–46.6)
Hemoglobin: 11.5 g/dL — ABNORMAL LOW (ref 11.6–15.9)
Lymphocytes Relative: 24 %
Lymphs Abs: 1.8 10*3/uL (ref 0.9–3.3)
MCH: 24.5 pg — ABNORMAL LOW (ref 25.1–34.0)
MCHC: 31.8 g/dL (ref 31.5–36.0)
MCV: 77.2 fL — ABNORMAL LOW (ref 79.5–101.0)
Monocytes Absolute: 0.7 10*3/uL (ref 0.1–0.9)
Monocytes Relative: 9 %
Neutro Abs: 4.9 10*3/uL (ref 1.5–6.5)
Neutrophils Relative %: 64 %
Platelet Count: 227 10*3/uL (ref 145–400)
RBC: 4.67 MIL/uL (ref 3.70–5.45)
RDW: 15 % — ABNORMAL HIGH (ref 11.2–14.5)
WBC Count: 7.6 10*3/uL (ref 3.9–10.3)

## 2017-05-24 LAB — CMP (CANCER CENTER ONLY)
ALT: 13 U/L (ref 0–55)
AST: 17 U/L (ref 5–34)
Albumin: 3.6 g/dL (ref 3.5–5.0)
Alkaline Phosphatase: 70 U/L (ref 40–150)
Anion gap: 9 (ref 3–11)
BUN: 25 mg/dL (ref 7–26)
CO2: 29 mmol/L (ref 22–29)
Calcium: 9.6 mg/dL (ref 8.4–10.4)
Chloride: 102 mmol/L (ref 98–109)
Creatinine: 1.12 mg/dL — ABNORMAL HIGH (ref 0.60–1.10)
GFR, Est AFR Am: 59 mL/min — ABNORMAL LOW (ref >60)
GFR, Estimated: 51 mL/min — ABNORMAL LOW (ref >60)
Glucose, Bld: 84 mg/dL (ref 70–140)
Potassium: 4 mmol/L (ref 3.5–5.1)
Sodium: 140 mmol/L (ref 136–145)
Total Bilirubin: 0.4 mg/dL (ref 0.2–1.2)
Total Protein: 7.6 g/dL (ref 6.4–8.3)

## 2017-05-24 MED ORDER — PALONOSETRON HCL INJECTION 0.25 MG/5ML
0.2500 mg | Freq: Once | INTRAVENOUS | Status: AC
  Administered 2017-05-24: 0.25 mg via INTRAVENOUS

## 2017-05-24 MED ORDER — DOXORUBICIN HCL CHEMO IV INJECTION 2 MG/ML
60.0000 mg/m2 | Freq: Once | INTRAVENOUS | Status: AC
  Administered 2017-05-24: 118 mg via INTRAVENOUS
  Filled 2017-05-24: qty 59

## 2017-05-24 MED ORDER — SODIUM CHLORIDE 0.9 % IV SOLN
Freq: Once | INTRAVENOUS | Status: AC
  Administered 2017-05-24: 14:00:00 via INTRAVENOUS
  Filled 2017-05-24: qty 5

## 2017-05-24 MED ORDER — PALONOSETRON HCL INJECTION 0.25 MG/5ML
INTRAVENOUS | Status: AC
  Filled 2017-05-24: qty 5

## 2017-05-24 MED ORDER — SODIUM CHLORIDE 0.9 % IV SOLN
600.0000 mg/m2 | Freq: Once | INTRAVENOUS | Status: AC
  Administered 2017-05-24: 1180 mg via INTRAVENOUS
  Filled 2017-05-24: qty 59

## 2017-05-24 MED ORDER — SODIUM CHLORIDE 0.9% FLUSH
10.0000 mL | INTRAVENOUS | Status: DC | PRN
  Filled 2017-05-24: qty 10

## 2017-05-24 MED ORDER — HEPARIN SOD (PORK) LOCK FLUSH 100 UNIT/ML IV SOLN
500.0000 [IU] | Freq: Once | INTRAVENOUS | Status: DC | PRN
  Filled 2017-05-24: qty 5

## 2017-05-24 MED ORDER — SODIUM CHLORIDE 0.9 % IV SOLN
Freq: Once | INTRAVENOUS | Status: AC
  Administered 2017-05-24: 14:00:00 via INTRAVENOUS

## 2017-05-24 NOTE — Patient Instructions (Signed)
[facility] Discharge Instructions for Patients Receiving Chemotherapy  Today you received the following chemotherapy agents doxorubicin (Adriamycin) and cyclophosphamide (Cytoxan)  To help prevent nausea and vomiting after your treatment, we encourage you to take your nausea medication as directed by your doctor.   If you develop nausea and vomiting that is not controlled by your nausea medication, call the clinic.   BELOW ARE SYMPTOMS THAT SHOULD BE REPORTED IMMEDIATELY:  *FEVER GREATER THAN 100.5 F  *CHILLS WITH OR WITHOUT FEVER  NAUSEA AND VOMITING THAT IS NOT CONTROLLED WITH YOUR NAUSEA MEDICATION  *UNUSUAL SHORTNESS OF BREATH  *UNUSUAL BRUISING OR BLEEDING  TENDERNESS IN MOUTH AND THROAT WITH OR WITHOUT PRESENCE OF ULCERS  *URINARY PROBLEMS  *BOWEL PROBLEMS  UNUSUAL RASH Items with * indicate a potential emergency and should be followed up as soon as possible.  Feel free to call the clinic should you have any questions or concerns. The clinic phone number is [phone].  Please show the [facility] at check-in to the Emergency Department and triage nurse.  Doxorubicin injection What is this medicine? DOXORUBICIN (dox oh ROO bi sin) is a chemotherapy drug. It is used to treat many kinds of cancer like leukemia, lymphoma, neuroblastoma, sarcoma, and Wilms' tumor. It is also used to treat bladder cancer, breast cancer, lung cancer, ovarian cancer, stomach cancer, and thyroid cancer. This medicine may be used for other purposes; ask your health care provider or pharmacist if you have questions. COMMON BRAND NAME(S): Adriamycin, Adriamycin PFS, Adriamycin RDF, Rubex What should I tell my health care provider before I take this medicine? They need to know if you have any of these conditions: -heart disease -history of low blood counts caused by a medicine -liver disease -recent or ongoing radiation therapy -an unusual or allergic reaction to  doxorubicin, other chemotherapy agents, other medicines, foods, dyes, or preservatives -pregnant or trying to get pregnant -breast-feeding How should I use this medicine? This drug is given as an infusion into a vein. It is administered in a hospital or clinic by a specially trained health care professional. If you have pain, swelling, burning or any unusual feeling around the site of your injection, tell your health care professional right away. Talk to your pediatrician regarding the use of this medicine in children. Special care may be needed. Overdosage: If you think you have taken too much of this medicine contact a poison control center or emergency room at once. NOTE: This medicine is only for you. Do not share this medicine with others. What if I miss a dose? It is important not to miss your dose. Call your doctor or health care professional if you are unable to keep an appointment. What may interact with this medicine? This medicine may interact with the following medications: -6-mercaptopurine -paclitaxel -phenytoin -St. John's Wort -trastuzumab -verapamil This list may not describe all possible interactions. Give your health care provider a list of all the medicines, herbs, non-prescription drugs, or dietary supplements you use. Also tell them if you smoke, drink alcohol, or use illegal drugs. Some items may interact with your medicine. What should I watch for while using this medicine? This drug may make you feel generally unwell. This is not uncommon, as chemotherapy can affect healthy cells as well as cancer cells. Report any side effects. Continue your course of treatment even though you feel ill unless your doctor tells you to stop. There is a maximum amount of this medicine you should receive throughout your  life. The amount depends on the medical condition being treated and your overall health. Your doctor will watch how much of this medicine you receive in your lifetime. Tell  your doctor if you have taken this medicine before. You may need blood work done while you are taking this medicine. Your urine may turn red for a few days after your dose. This is not blood. If your urine is dark or brown, call your doctor. In some cases, you may be given additional medicines to help with side effects. Follow all directions for their use. Call your doctor or health care professional for advice if you get a fever, chills or sore throat, or other symptoms of a cold or flu. Do not treat yourself. This drug decreases your body's ability to fight infections. Try to avoid being around people who are sick. This medicine may increase your risk to bruise or bleed. Call your doctor or health care professional if you notice any unusual bleeding. Talk to your doctor about your risk of cancer. You may be more at risk for certain types of cancers if you take this medicine. Do not become pregnant while taking this medicine or for 6 months after stopping it. Women should inform their doctor if they wish to become pregnant or think they might be pregnant. Men should not father a child while taking this medicine and for 6 months after stopping it. There is a potential for serious side effects to an unborn child. Talk to your health care professional or pharmacist for more information. Do not breast-feed an infant while taking this medicine. This medicine has caused ovarian failure in some women and reduced sperm counts in some men This medicine may interfere with the ability to have a child. Talk with your doctor or health care professional if you are concerned about your fertility. What side effects may I notice from receiving this medicine? Side effects that you should report to your doctor or health care professional as soon as possible: -allergic reactions like skin rash, itching or hives, swelling of the face, lips, or tongue -breathing problems -chest pain -fast or irregular heartbeat -low blood  counts - this medicine may decrease the number of white blood cells, red blood cells and platelets. You may be at increased risk for infections and bleeding. -pain, redness, or irritation at site where injected -signs of infection - fever or chills, cough, sore throat, pain or difficulty passing urine -signs of decreased platelets or bleeding - bruising, pinpoint red spots on the skin, black, tarry stools, blood in the urine -swelling of the ankles, feet, hands -tiredness -weakness Side effects that usually do not require medical attention (report to your doctor or health care professional if they continue or are bothersome): -diarrhea -hair loss -mouth sores -nail discoloration or damage -nausea -red colored urine -vomiting This list may not describe all possible side effects. Call your doctor for medical advice about side effects. You may report side effects to FDA at 1-800-FDA-1088. Where should I keep my medicine? This drug is given in a hospital or clinic and will not be stored at home. NOTE: This sheet is a summary. It may not cover all possible information. If you have questions about this medicine, talk to your doctor, pharmacist, or health care provider.  2018 Elsevier/Gold Standard (2015-05-05 11:28:51)  Cyclophosphamide injection What is this medicine? CYCLOPHOSPHAMIDE (sye kloe FOSS fa mide) is a chemotherapy drug. It slows the growth of cancer cells. This medicine is used to treat many  types of cancer like lymphoma, myeloma, leukemia, breast cancer, and ovarian cancer, to name a few. This medicine may be used for other purposes; ask your health care provider or pharmacist if you have questions. COMMON BRAND NAME(S): Cytoxan, Neosar What should I tell my health care provider before I take this medicine? They need to know if you have any of these conditions: -blood disorders -history of other chemotherapy -infection -kidney disease -liver disease -recent or ongoing  radiation therapy -tumors in the bone marrow -an unusual or allergic reaction to cyclophosphamide, other chemotherapy, other medicines, foods, dyes, or preservatives -pregnant or trying to get pregnant -breast-feeding How should I use this medicine? This drug is usually given as an injection into a vein or muscle or by infusion into a vein. It is administered in a hospital or clinic by a specially trained health care professional. Talk to your pediatrician regarding the use of this medicine in children. Special care may be needed. Overdosage: If you think you have taken too much of this medicine contact a poison control center or emergency room at once. NOTE: This medicine is only for you. Do not share this medicine with others. What if I miss a dose? It is important not to miss your dose. Call your doctor or health care professional if you are unable to keep an appointment. What may interact with this medicine? This medicine may interact with the following medications: -amiodarone -amphotericin B -azathioprine -certain antiviral medicines for HIV or AIDS such as protease inhibitors (e.g., indinavir, ritonavir) and zidovudine -certain blood pressure medications such as benazepril, captopril, enalapril, fosinopril, lisinopril, moexipril, monopril, perindopril, quinapril, ramipril, trandolapril -certain cancer medications such as anthracyclines (e.g., daunorubicin, doxorubicin), busulfan, cytarabine, paclitaxel, pentostatin, tamoxifen, trastuzumab -certain diuretics such as chlorothiazide, chlorthalidone, hydrochlorothiazide, indapamide, metolazone -certain medicines that treat or prevent blood clots like warfarin -certain muscle relaxants such as succinylcholine -cyclosporine -etanercept -indomethacin -medicines to increase blood counts like filgrastim, pegfilgrastim, sargramostim -medicines used as general anesthesia -metronidazole -natalizumab This list may not describe all possible  interactions. Give your health care provider a list of all the medicines, herbs, non-prescription drugs, or dietary supplements you use. Also tell them if you smoke, drink alcohol, or use illegal drugs. Some items may interact with your medicine. What should I watch for while using this medicine? Visit your doctor for checks on your progress. This drug may make you feel generally unwell. This is not uncommon, as chemotherapy can affect healthy cells as well as cancer cells. Report any side effects. Continue your course of treatment even though you feel ill unless your doctor tells you to stop. Drink water or other fluids as directed. Urinate often, even at night. In some cases, you may be given additional medicines to help with side effects. Follow all directions for their use. Call your doctor or health care professional for advice if you get a fever, chills or sore throat, or other symptoms of a cold or flu. Do not treat yourself. This drug decreases your body's ability to fight infections. Try to avoid being around people who are sick. This medicine may increase your risk to bruise or bleed. Call your doctor or health care professional if you notice any unusual bleeding. Be careful brushing and flossing your teeth or using a toothpick because you may get an infection or bleed more easily. If you have any dental work done, tell your dentist you are receiving this medicine. You may get drowsy or dizzy. Do not drive, use machinery, or do  anything that needs mental alertness until you know how this medicine affects you. Do not become pregnant while taking this medicine or for 1 year after stopping it. Women should inform their doctor if they wish to become pregnant or think they might be pregnant. Men should not father a child while taking this medicine and for 4 months after stopping it. There is a potential for serious side effects to an unborn child. Talk to your health care professional or pharmacist for  more information. Do not breast-feed an infant while taking this medicine. This medicine may interfere with the ability to have a child. This medicine has caused ovarian failure in some women. This medicine has caused reduced sperm counts in some men. You should talk with your doctor or health care professional if you are concerned about your fertility. If you are going to have surgery, tell your doctor or health care professional that you have taken this medicine. What side effects may I notice from receiving this medicine? Side effects that you should report to your doctor or health care professional as soon as possible: -allergic reactions like skin rash, itching or hives, swelling of the face, lips, or tongue -low blood counts - this medicine may decrease the number of white blood cells, red blood cells and platelets. You may be at increased risk for infections and bleeding. -signs of infection - fever or chills, cough, sore throat, pain or difficulty passing urine -signs of decreased platelets or bleeding - bruising, pinpoint red spots on the skin, black, tarry stools, blood in the urine -signs of decreased red blood cells - unusually weak or tired, fainting spells, lightheadedness -breathing problems -dark urine -dizziness -palpitations -swelling of the ankles, feet, hands -trouble passing urine or change in the amount of urine -weight gain -yellowing of the eyes or skin Side effects that usually do not require medical attention (report to your doctor or health care professional if they continue or are bothersome): -changes in nail or skin color -hair loss -missed menstrual periods -mouth sores -nausea, vomiting This list may not describe all possible side effects. Call your doctor for medical advice about side effects. You may report side effects to FDA at 1-800-FDA-1088. Where should I keep my medicine? This drug is given in a hospital or clinic and will not be stored at  home. NOTE: This sheet is a summary. It may not cover all possible information. If you have questions about this medicine, talk to your doctor, pharmacist, or health care provider.  2018 Elsevier/Gold Standard (2012-01-21 16:22:58)

## 2017-05-24 NOTE — Telephone Encounter (Signed)
  Oncology Nurse Navigator Documentation  Navigator Location: [facility] (05/24/17 1500)   )Navigator Encounter Type: Treatment (05/24/17 1500)                     Patient Visit Type: MedOnc (05/24/17 1500) Treatment Phase: First Chemo Tx (05/24/17 1500)                            Time Spent with Patient: 15 (05/24/17 1500)

## 2017-05-24 NOTE — Progress Notes (Signed)
Met w/ pt to introduce myself as her Arboriculturist and to discuss copay assistance.  Pt gave me consent to apply in her behalf so I enrolled her in the Patient [place].  She is approved for 12 months from 05/24/17 for $4,000.  $2,500 is her standard award and $1,500 is accessible on a [employer] basis as long as funding remains available in the [place].  I also enrolled her in the [employer] program for [place] for a year from 05/24/17 for $15,000.  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.  She has my card for any questions or concerns she may have in the future.

## 2017-05-26 ENCOUNTER — Telehealth: Payer: Self-pay | Admitting: *Deleted

## 2017-05-26 ENCOUNTER — Inpatient Hospital Stay: Payer: BC Managed Care – PPO

## 2017-05-26 VITALS — BP 138/76 | HR 76 | Temp 97.8°F | Resp 18

## 2017-05-26 DIAGNOSIS — Z171 Estrogen receptor negative status [ER-]: Secondary | ICD-10-CM

## 2017-05-26 DIAGNOSIS — C50511 Malignant neoplasm of lower-outer quadrant of right female breast: Secondary | ICD-10-CM

## 2017-05-26 DIAGNOSIS — Z5111 Encounter for antineoplastic chemotherapy: Secondary | ICD-10-CM | POA: Diagnosis not present

## 2017-05-26 MED ORDER — PEGFILGRASTIM-CBQV 6 MG/0.6ML ~~LOC~~ SOSY
PREFILLED_SYRINGE | SUBCUTANEOUS | Status: AC
  Filled 2017-05-26: qty 0.6

## 2017-05-26 MED ORDER — PEGFILGRASTIM-CBQV 6 MG/0.6ML ~~LOC~~ SOSY
6.0000 mg | PREFILLED_SYRINGE | Freq: Once | SUBCUTANEOUS | Status: AC
  Administered 2017-05-26: 6 mg via SUBCUTANEOUS

## 2017-05-26 NOTE — Telephone Encounter (Signed)
-----   Message from Jesse Fall, RN sent at 05/24/2017  4:48 PM EST ----- Regarding: Dr Magrinat-chemo f/u Dr Jana Hakim !st A/C, tol well.  Coming for [facility] 05/26/16

## 2017-05-26 NOTE — Telephone Encounter (Signed)
This RN returned call to pt per

## 2017-05-26 NOTE — Telephone Encounter (Signed)
This RN spoke with pt per chemo follow up with pt stating she has had minimal nausea - no vomiting.  She does notice  Dizziness and headaches occurring after taking the prochlorperizine and lorazepam.  This RN reviewed pt's antinausea regimen including use of emend on day of chemo administration as well as home use of scheduled dosing of decadron.  Emily Perez states she has available ondansetron per other physician and has utilized it if she felt breakthrough nausea.  She is able to hydrate and eat.  She does notice " I get the burps more but that is it "  Per discussion of above - Emily Perez will use the ondansetron for breakthrough nausea and post chemo support meds can be further discussion at her appointment on 05/31/2017.

## 2017-05-30 ENCOUNTER — Other Ambulatory Visit: Payer: Self-pay

## 2017-05-30 DIAGNOSIS — Z171 Estrogen receptor negative status [ER-]: Secondary | ICD-10-CM

## 2017-05-30 DIAGNOSIS — C50511 Malignant neoplasm of lower-outer quadrant of right female breast: Secondary | ICD-10-CM

## 2017-05-31 ENCOUNTER — Ambulatory Visit: Payer: BC Managed Care – PPO | Admitting: Adult Health

## 2017-05-31 ENCOUNTER — Telehealth: Payer: Self-pay | Admitting: *Deleted

## 2017-05-31 ENCOUNTER — Other Ambulatory Visit: Payer: BC Managed Care – PPO

## 2017-05-31 MED ORDER — FAMOTIDINE 20 MG PO TABS: 20.0000 mg | ORAL_TABLET | Freq: Two times a day (BID) | ORAL | 3 refills | Status: DC

## 2017-05-31 NOTE — Telephone Encounter (Signed)
This RN spoke with pt per her call stating onset of " severe hunger pains that keep me awake at night "  Above started on 3/7 post use of decadron. ( she has completed decadron use for this cycle ).  She has symptoms during the day but is able to eat about every 2 hours with some benefit.  Above discussed as likely stomach irritation from medications we are giving her - with known benefit with use of another medication - Pepcid.  Use of Pepcid discussed with pt agreeing to use.  Prescription called in for possible less cost then OTC.  Sherrice verbalized understanding to call if above symptom not relieved.

## 2017-06-07 ENCOUNTER — Encounter: Payer: Self-pay | Admitting: Adult Health

## 2017-06-07 ENCOUNTER — Other Ambulatory Visit: Payer: BC Managed Care – PPO

## 2017-06-07 ENCOUNTER — Ambulatory Visit: Payer: BC Managed Care – PPO

## 2017-06-07 ENCOUNTER — Inpatient Hospital Stay: Payer: BC Managed Care – PPO

## 2017-06-07 ENCOUNTER — Telehealth: Payer: Self-pay | Admitting: Adult Health

## 2017-06-07 ENCOUNTER — Inpatient Hospital Stay (HOSPITAL_BASED_OUTPATIENT_CLINIC_OR_DEPARTMENT_OTHER): Payer: BC Managed Care – PPO | Admitting: Adult Health

## 2017-06-07 ENCOUNTER — Other Ambulatory Visit: Payer: Self-pay | Admitting: Adult Health

## 2017-06-07 VITALS — BP 142/86 | HR 86 | Temp 98.4°F | Resp 18 | Ht 63.5 in | Wt 189.5 lb

## 2017-06-07 DIAGNOSIS — Z171 Estrogen receptor negative status [ER-]: Secondary | ICD-10-CM

## 2017-06-07 DIAGNOSIS — Z5111 Encounter for antineoplastic chemotherapy: Secondary | ICD-10-CM | POA: Diagnosis not present

## 2017-06-07 DIAGNOSIS — R110 Nausea: Secondary | ICD-10-CM | POA: Diagnosis not present

## 2017-06-07 DIAGNOSIS — C50411 Malignant neoplasm of upper-outer quadrant of right female breast: Secondary | ICD-10-CM

## 2017-06-07 DIAGNOSIS — C50511 Malignant neoplasm of lower-outer quadrant of right female breast: Secondary | ICD-10-CM

## 2017-06-07 DIAGNOSIS — Z803 Family history of malignant neoplasm of breast: Secondary | ICD-10-CM

## 2017-06-07 DIAGNOSIS — C773 Secondary and unspecified malignant neoplasm of axilla and upper limb lymph nodes: Secondary | ICD-10-CM

## 2017-06-07 DIAGNOSIS — B370 Candidal stomatitis: Secondary | ICD-10-CM | POA: Diagnosis not present

## 2017-06-07 LAB — CBC WITH DIFFERENTIAL (CANCER CENTER ONLY)
Basophils Absolute: 0 10*3/uL (ref 0.0–0.1)
Basophils Relative: 1 %
Eosinophils Absolute: 0 10*3/uL (ref 0.0–0.5)
Eosinophils Relative: 0 %
HCT: 33 % — ABNORMAL LOW (ref 34.8–46.6)
Hemoglobin: 10.6 g/dL — ABNORMAL LOW (ref 11.6–15.9)
Lymphocytes Relative: 15 %
Lymphs Abs: 1.4 10*3/uL (ref 0.9–3.3)
MCH: 25.1 pg (ref 25.1–34.0)
MCHC: 32.2 g/dL (ref 31.5–36.0)
MCV: 77.7 fL — ABNORMAL LOW (ref 79.5–101.0)
Monocytes Absolute: 1.5 10*3/uL — ABNORMAL HIGH (ref 0.1–0.9)
Monocytes Relative: 16 %
Neutro Abs: 6.7 10*3/uL — ABNORMAL HIGH (ref 1.5–6.5)
Neutrophils Relative %: 68 %
Platelet Count: 185 10*3/uL (ref 145–400)
RBC: 4.25 MIL/uL (ref 3.70–5.45)
RDW: 14.4 % (ref 11.2–14.5)
WBC Count: 9.7 10*3/uL (ref 3.9–10.3)

## 2017-06-07 LAB — CMP (CANCER CENTER ONLY)
ALT: 10 U/L (ref 0–55)
AST: 13 U/L (ref 5–34)
Albumin: 3.4 g/dL — ABNORMAL LOW (ref 3.5–5.0)
Alkaline Phosphatase: 87 U/L (ref 40–150)
Anion gap: 8 (ref 3–11)
BUN: 24 mg/dL (ref 7–26)
CO2: 27 mmol/L (ref 22–29)
Calcium: 9.2 mg/dL (ref 8.4–10.4)
Chloride: 106 mmol/L (ref 98–109)
Creatinine: 1.09 mg/dL (ref 0.60–1.10)
GFR, Est AFR Am: >60 mL/min (ref >60)
GFR, Estimated: 53 mL/min — ABNORMAL LOW (ref >60)
Glucose, Bld: 80 mg/dL (ref 70–140)
Potassium: 3.9 mmol/L (ref 3.5–5.1)
Sodium: 141 mmol/L (ref 136–145)
Total Bilirubin: <0.2 mg/dL — ABNORMAL LOW (ref 0.2–1.2)
Total Protein: 7.2 g/dL (ref 6.4–8.3)

## 2017-06-07 MED ORDER — FOSAPREPITANT DIMEGLUMINE INJECTION 150 MG
Freq: Once | INTRAVENOUS | Status: AC
  Administered 2017-06-07: 11:00:00 via INTRAVENOUS
  Filled 2017-06-07: qty 5

## 2017-06-07 MED ORDER — HEPARIN SOD (PORK) LOCK FLUSH 100 UNIT/ML IV SOLN
500.0000 [IU] | Freq: Once | INTRAVENOUS | Status: AC | PRN
  Administered 2017-06-07: 500 [IU]
  Filled 2017-06-07: qty 5

## 2017-06-07 MED ORDER — FLUCONAZOLE 200 MG PO TABS: 200.0000 mg | ORAL_TABLET | Freq: Every day | ORAL | 5 refills | Status: DC

## 2017-06-07 MED ORDER — SODIUM CHLORIDE 0.9 % IV SOLN
Freq: Once | INTRAVENOUS | Status: AC
  Administered 2017-06-07: 10:00:00 via INTRAVENOUS

## 2017-06-07 MED ORDER — SODIUM CHLORIDE 0.9% FLUSH
10.0000 mL | INTRAVENOUS | Status: DC | PRN
  Administered 2017-06-07: 10 mL
  Filled 2017-06-07: qty 10

## 2017-06-07 MED ORDER — DOXORUBICIN HCL CHEMO IV INJECTION 2 MG/ML
60.0000 mg/m2 | Freq: Once | INTRAVENOUS | Status: AC
  Administered 2017-06-07: 118 mg via INTRAVENOUS
  Filled 2017-06-07: qty 59

## 2017-06-07 MED ORDER — PALONOSETRON HCL INJECTION 0.25 MG/5ML
0.2500 mg | Freq: Once | INTRAVENOUS | Status: AC
  Administered 2017-06-07: 0.25 mg via INTRAVENOUS

## 2017-06-07 MED ORDER — PALONOSETRON HCL INJECTION 0.25 MG/5ML
INTRAVENOUS | Status: AC
  Filled 2017-06-07: qty 5

## 2017-06-07 MED ORDER — SODIUM CHLORIDE 0.9 % IV SOLN
600.0000 mg/m2 | Freq: Once | INTRAVENOUS | Status: AC
  Administered 2017-06-07: 1180 mg via INTRAVENOUS
  Filled 2017-06-07: qty 59

## 2017-06-07 NOTE — Patient Instructions (Signed)
[facility] Discharge Instructions for Patients Receiving Chemotherapy  Today you received the following chemotherapy agents doxorubicin (Adriamycin) and cyclophosphamide (Cytoxan)  To help prevent nausea and vomiting after your treatment, we encourage you to take your nausea medication as directed by your doctor.   If you develop nausea and vomiting that is not controlled by your nausea medication, call the clinic.   BELOW ARE SYMPTOMS THAT SHOULD BE REPORTED IMMEDIATELY:  *FEVER GREATER THAN 100.5 F  *CHILLS WITH OR WITHOUT FEVER  NAUSEA AND VOMITING THAT IS NOT CONTROLLED WITH YOUR NAUSEA MEDICATION  *UNUSUAL SHORTNESS OF BREATH  *UNUSUAL BRUISING OR BLEEDING  TENDERNESS IN MOUTH AND THROAT WITH OR WITHOUT PRESENCE OF ULCERS  *URINARY PROBLEMS  *BOWEL PROBLEMS  UNUSUAL RASH Items with * indicate a potential emergency and should be followed up as soon as possible.  Feel free to call the clinic should you have any questions or concerns. The clinic phone number is [phone].  Please show the [facility] at check-in to the Emergency Department and triage nurse.

## 2017-06-07 NOTE — Telephone Encounter (Signed)
Gave avs and calendar ° °

## 2017-06-07 NOTE — Progress Notes (Signed)
[place]  [phone] [phone]     ID: Emily Perez DOB: Dec 11, 1955  MR#: [id number]  FBP#:[id number]  Patient Care Team: Emily Frees, MD as PCP - General (Family Medicine) Perez, Emily Dad, MD as Consulting Physician (Oncology) Emily Bookbinder, MD as Consulting Physician (General Surgery) Emily Pray, MD as Consulting Physician (Radiation Oncology) Emily Kenner, MD (Dermatology) Emily Lose, MD as Consulting Physician (Obstetrics and Gynecology) Emily Bond, MD as Consulting Physician (Orthopedic Surgery) Emily Slates, MD as Physician Assistant (Radiology) OTHER MD:  CHIEF COMPLAINT: Triple negative breast cancer  CURRENT TREATMENT: Neoadjuvant chemotherapy   INTERVAL HISTORY: Emily Perez returns today for follow up and treatment of her triple negative breast cancer accompanied by her daughter.  On 05/25/2017, she started neoadjuvant chemotherapy with Doxorubicin and Cyclophosphamide dose dense--even every 14 days x4, to be followed by weekly paclitaxel x12.  Today is cycle 2 day 1 of treatment.  REVIEW OF SYSTEMS: Emily Perez reports that she did moderately well with her first cycle of chemotherapy.  She is frustrated because she attempted to call after hours with concerns of significant reflux and indigestion.  She was connected to a nurse, who she was disappointed to know has no access to our computer system, and she did not feel like she was helpful.  Emily Perez tells me that the discomfort from the Dexamethasone was so significant that she does not plan on taking it any longer.  She has Compazine and after three days will take Zofran ODT if she needs it (prescribed by her PCP).  She did have some fatigue after chemotherapy for 4 days, but that has resolved at this point.  She noted a couple of loose bowel movements yesterday and got Pepto Bismol and took it, and it has improved.  She noted just after chemotherapy she had some constipation  that required her to take fiber supplements and has since cleared up.  She has occasional shortness of breath with activity, but no other issues today such as fevers, chills, vision changes, mouth ulcers, difficulty swallowing, nausea, vomiting, chest pain/palpitations.     HISTORY OF CURRENT ILLNESS: Emily Perez had routine bilateral screening mammography at [facility] on 04/19/2017 showing a possible abnormality in the right breast. She underwent unilateral right diagnostic mammography with tomography and right breast ultrasonography at [facility] on 04/25/2017 showing: breast density category B.  In the right breast at the 6:00 radiant 2 cm from the nipple there was a 2 cm oval mass which by ultrasound measured 2.5 cm and was irregular and hypoechoic.  There were also at least 3 abnormal appearing lymph nodes in the right axilla.  Accordingly on 05/02/2017 she proceeded to biopsy of the right breast mass in question and lymph node sampling. The pathology from this procedure showed ([id number]): Invasive ductal carcinoma grade II. One lymph node positive for metastatic carcinoma (1/1). Prognostic indicators significant for: estrogen receptor, 0% negative and progesterone receptor, 0% negative. Proliferation marker Ki67 at 80%. HER2 not amplified with ratios HER2/CEP17 signals 1.29 and average copies per cell 2.25  The patient's subsequent history is as detailed below.    PAST MEDICAL HISTORY: Past Medical History:  Diagnosis Date  . Arthritis   . Headache    migraines in the past  . Hypertension   . PONV (postoperative nausea and vomiting)    after 1st colonoscopy  Migraines in the past.   PAST SURGICAL HISTORY: Past Surgical History:  Procedure Laterality Date  . COLONOSCOPY    . FRACTURE SURGERY  Left    wrist  . PORTACATH PLACEMENT Right 05/19/2017   Procedure: INSERTION PORT-A-CATH WITH ULTRASOUND;  Surgeon: Emily Bookbinder, MD;  Location: [facility];  Service: General;  Laterality:  Right;    Wrist Fracture. Torn Rotator cuff and torn meniscus.    FAMILY HISTORY Family History  Problem Relation Age of Onset  . Heart disease Mother   . Cancer Father   . Diabetes Father   . Hypertension Father   . Hyperlipidemia Father   . Heart disease Brother   . Hypertension Brother   . Hyperlipidemia Brother   . Diabetes Brother   . Cancer Sister        breast  . Breast cancer Sister   The patient's father died at age 71 due to heart disease and emphysema. The patient's mother died at age 25 due to heart disease. The patient has 6 brothers and 8 sisters. She notes 2 sisters with breast cancer. The 1st sister was diagnosed at age 32, and the 2nd sister was diagnosed at age 69. She notes that both sisters are alive. She also notes a paternal first cousin with breast cancer diagnosed in her 53's. The patient notes that her father also had bone cancer, but she's unsure if he had myeloma. She denies a family history of ovarian cancer.   GYNECOLOGIC HISTORY:  No LMP recorded. Patient is postmenopausal. 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 She is divorced. She lives by herself with no pets. The patient's daughter, Emily Perez, works in Therapist, art for [employer]. The patient's son, Emily Perez,  works for [employer]. The patient has  2 grandchildren. She belongs to [facility].      ADVANCED DIRECTIVES: Not in place.  At the 05/19/2017 visit the patient was given the appropriate documents to complete and notarized at her discretion   HEALTH MAINTENANCE: Social History   Tobacco Use  . Smoking status: Never Smoker  . Smokeless tobacco: Never Used  Substance Use Topics  . Alcohol use: No  . Drug use: No     Colonoscopy:   PAP: September 2018 normal  Bone density:  none   Allergies  Allergen Reactions  . Latex Hives and Rash    Current Outpatient Medications  Medication Sig Dispense Refill  . acetaminophen (TYLENOL) 160 MG/5ML elixir Take 15 mg/kg by mouth every 4 (four) hours as needed for fever.    Emily Perez aspirin 81 MG tablet Take 81 mg by mouth daily.    . Camphor-Eucalyptus-Menthol (VICKS VAPORUB EX) Apply 1 application topically as needed (colds or bug bites).    Emily Perez dexamethasone (DECADRON) 4 MG tablet Take 2 tablets by mouth once a day on the day after chemotherapy and then take 2 tablets two times a day for 2 days. Take with food. 30 tablet 1  . diphenhydramine-acetaminophen (TYLENOL PM) 25-500 MG TABS tablet Take 1 tablet by mouth at bedtime as needed.    . famotidine (PEPCID) 20 MG tablet Take 1 tablet (20 mg total) by mouth 2 (two) times daily. 60 tablet 3  . fluocinonide cream (LIDEX) 6.54 % Apply 1 application topically 2 (two) times daily as needed (rash).    . halobetasol (ULTRAVATE) 0.05 % cream Apply 1 application topically 2 (two) times daily as needed (rash).    Emily Perez lidocaine-prilocaine (EMLA)  cream Apply to affected area once 30 g 3  . LORazepam (ATIVAN) 0.5 MG tablet Take 1 tablet (0.5 mg total) by mouth at bedtime as needed (Nausea or vomiting). 30 tablet 0  . meclizine (ANTIVERT) 12.5 MG tablet Take 6.25 mg by mouth 3 (three) times daily as needed for dizziness.    . naproxen sodium (ALEVE) 220 MG tablet Take 110-220 mg by mouth daily as needed (pain). Takes 0.5-1 tablet as needed for pain    . ondansetron (ZOFRAN-ODT) 8 MG disintegrating tablet Take 8 mg by mouth every 8 (eight) hours as needed for nausea or vomiting.    Emily Perez OVER THE COUNTER MEDICATION Apply 1 application topically at bedtime. Pain Relief Balm    . oxyCODONE (OXY IR/ROXICODONE) 5 MG immediate release tablet Take 1 tablet (5 mg total) by mouth every 6 (six) hours as needed for moderate pain, severe pain or breakthrough pain. 10 tablet 0  . prochlorperazine (COMPAZINE) 10 MG  tablet Take 1 tablet (10 mg total) by mouth every 6 (six) hours as needed (Nausea or vomiting). 30 tablet 1  . tiZANidine (ZANAFLEX) 4 MG tablet TAKE 0.5-1 TABLET AS NEEDED FOR MUSCLE SPASMS  1  . triamterene-hydrochlorothiazide (DYAZIDE) 37.5-25 MG capsule Take 1 capsule by mouth daily.  3  . vitamin B-12 (CYANOCOBALAMIN) 1000 MCG tablet Take 500 mcg by mouth daily. Takes 0.5 tablet     No current facility-administered medications for this visit.     OBJECTIVE: Vitals:   06/07/17 0858  BP: (!) 142/86  Pulse: 86  Resp: 18  Temp: 98.4 F (36.9 C)  SpO2: 99%     Body mass index is 33.04 kg/m.   Wt Readings from Last 3 Encounters:  06/07/17 189 lb 8 oz (86 kg)  05/20/17 189 lb 12.8 oz (86.1 kg)  05/19/17 190 lb 11.2 oz (86.5 kg)    ECOG FS:0 - Asymptomatic GENERAL: Patient is a well appearing female in no acute distress HEENT:  Sclerae anicteric.  Oropharynx moist, no ulceration, very tiny amount of thrush noted in corner of posterior pharynx. Neck is supple.  NODES:  No cervical, supraclavicular, or axillary lymphadenopathy palpated.  BREAST EXAM:  Deferred. LUNGS:  Clear to auscultation bilaterally.  No wheezes or rhonchi. HEART:  Regular rate and rhythm. No murmur appreciated. ABDOMEN:  Soft, nontender.  Positive, normoactive bowel sounds. No organomegaly palpated. MSK:  No focal spinal tenderness to palpation. Full range of motion bilaterally in the upper extremities. EXTREMITIES:  No peripheral edema.   SKIN:  Clear with no obvious rashes or skin changes. No nail dyscrasia. NEURO:  Nonfocal. Well oriented.  Appropriate affect.    LAB RESULTS:  CMP     Component Value Date/Time   NA 140 05/24/2017 1154   K 4.0 05/24/2017 1154   CL 102 05/24/2017 1154   CO2 29 05/24/2017 1154   GLUCOSE 84 05/24/2017 1154   BUN 25 05/24/2017 1154   CREATININE 1.12 (H) 05/24/2017 1154   CALCIUM 9.6 05/24/2017 1154   PROT 7.6 05/24/2017 1154   ALBUMIN 3.6 05/24/2017 1154   AST 17  05/24/2017 1154   ALT 13 05/24/2017 1154   ALKPHOS 70 05/24/2017 1154   BILITOT 0.4 05/24/2017 1154   GFRNONAA 51 (L) 05/24/2017 1154   GFRAA 59 (L) 05/24/2017 1154    No results found for: TOTALPROTELP, ALBUMINELP, A1GS, A2GS, BETS, BETA2SER, GAMS, MSPIKE, SPEI  No results found for: KPAFRELGTCHN, LAMBDASER, KAPLAMBRATIO  Lab Results  Component Value Date   WBC 9.7  06/07/2017   NEUTROABS 6.7 (H) 06/07/2017   HGB 11.5 (L) 08/12/2012   HCT 33.0 (L) 06/07/2017   MCV 77.7 (L) 06/07/2017   PLT 185 06/07/2017    @LASTCHEMISTRY @  No results found for: LABCA2  No components found for: JXBJYN829  No results for input(s): INR in the last 168 hours.  No results found for: LABCA2  No results found for: FAO130  No results found for: QMV784  No results found for: ONG295  No results found for: CA2729  No components found for: HGQUANT  No results found for: CEA1 / No results found for: CEA1   No results found for: AFPTUMOR  No results found for: CHROMOGRNA  No results found for: PSA1  Appointment on 06/07/2017  Component Date Value Ref Range Status  . WBC Count 06/07/2017 9.7  3.9 - 10.3 K/uL Final  . RBC 06/07/2017 4.25  3.70 - 5.45 MIL/uL Final  . Hemoglobin 06/07/2017 10.6* 11.6 - 15.9 g/dL Final  . HCT 06/07/2017 33.0* 34.8 - 46.6 % Final  . MCV 06/07/2017 77.7* 79.5 - 101.0 fL Final  . MCH 06/07/2017 25.1  25.1 - 34.0 pg Final  . MCHC 06/07/2017 32.2  31.5 - 36.0 g/dL Final  . RDW 06/07/2017 14.4  11.2 - 14.5 % Final  . Platelet Count 06/07/2017 185  145 - 400 K/uL Final  . Neutrophils Relative % 06/07/2017 68  % Final  . Neutro Abs 06/07/2017 6.7* 1.5 - 6.5 K/uL Final  . Lymphocytes Relative 06/07/2017 15  % Final  . Lymphs Abs 06/07/2017 1.4  0.9 - 3.3 K/uL Final  . Monocytes Relative 06/07/2017 16  % Final  . Monocytes Absolute 06/07/2017 1.5* 0.1 - 0.9 K/uL Final  . Eosinophils Relative 06/07/2017 0  % Final  . Eosinophils Absolute 06/07/2017 0.0  0.0 -  0.5 K/uL Final  . Basophils Relative 06/07/2017 1  % Final  . Basophils Absolute 06/07/2017 0.0  0.0 - 0.1 K/uL Final   Performed at [facility], [place] [address] [place], [place] [address]    (this displays the last labs from the last 3 days)  No results found for: TOTALPROTELP, ALBUMINELP, A1GS, A2GS, BETS, BETA2SER, GAMS, MSPIKE, SPEI (this displays SPEP labs)  No results found for: KPAFRELGTCHN, LAMBDASER, KAPLAMBRATIO (kappa/lambda light chains)  No results found for: HGBA, HGBA2QUANT, HGBFQUANT, HGBSQUAN (Hemoglobinopathy evaluation)   No results found for: LDH  No results found for: IRON, TIBC, IRONPCTSAT (Iron and TIBC)  No results found for: FERRITIN  Urinalysis No results found for: COLORURINE, APPEARANCEUR, LABSPEC, PHURINE, GLUCOSEU, HGBUR, BILIRUBINUR, KETONESUR, PROTEINUR, UROBILINOGEN, NITRITE, LEUKOCYTESUR   STUDIES: Ct Chest W Contrast  Result Date: 05/18/2017 CLINICAL DATA:  Newly diagnosed right lower outer quadrant breast carcinoma. Staging. EXAM: CT CHEST WITH CONTRAST TECHNIQUE: Multidetector CT imaging of the chest was performed during intravenous contrast administration. CONTRAST:  72m ISOVUE-300 IOPAMIDOL (ISOVUE-300) INJECTION 61% COMPARISON:  None. FINDINGS: Cardiovascular: No acute findings. Aortic and coronary artery atherosclerosis. Mediastinum/Nodes: Mild right axillary and subpectoral lymphadenopathy, largest lymph node in right axilla measuring 11 mm on image 38/2. 3 cm right breast soft tissue mass is seen with overlying skin thickening, consistent with known primary breast carcinoma. No mediastinal or hilar lymphadenopathy identified. Lungs/Pleura: No pulmonary infiltrate or mass identified. No effusion present. Upper Abdomen:  Unremarkable. Musculoskeletal:  No suspicious bone lesions. IMPRESSION: 3 cm right breast soft tissue mass with overlying skin thickening, consistent with known primary breast carcinoma. Mild right  axillary and subpectoral lymphadenopathy.  No other sites of metastatic disease within the thorax. Aortic Atherosclerosis (ICD10-I70.0). Coronary artery calcification. Electronically Signed   By: Earle Gell M.D.   On: 05/18/2017 11:46   Nm Bone Scan Whole Body  Result Date: 05/18/2017 CLINICAL DATA:  Breast cancer, no bone pain or recent trauma EXAM: NUCLEAR MEDICINE WHOLE BODY BONE SCAN TECHNIQUE: Whole body anterior and posterior images were obtained approximately 3 hours after intravenous injection of radiopharmaceutical. RADIOPHARMACEUTICALS:  20.7 mCi Technetium-64mMDP IV COMPARISON:  None Radiographic correlation: CT chest 05/18/2017 FINDINGS: Uptake at the shoulders, knees especially RIGHT, and feet, typically degenerative. No abnormal osseous tracer accumulation is identified which is suspicious for osseous metastatic disease. Minimal nonspecific tracer localization within the breast greater on RIGHT. Urinary tract and soft tissue distribution of tracer is otherwise unremarkable. IMPRESSION: No scintigraphic evidence of osseous metastatic disease. Electronically Signed   By: MLavonia DanaM.D.   On: 05/18/2017 14:35   Mr Breast Bilateral W Wo Contrast Inc Cad  Result Date: 05/18/2017 CLINICAL DATA:  62year old female newly diagnosed with right breast cancer metastatic to the right axillary lymph node, presenting for initial imaging prior to initiation of chemotherapy. The patient was diagnosed in January of 2019 with a triple negative breast cancer in the right breast. She has family history of breast cancer in 2 sisters who were diagnosed at age 343and 659 LABS:  Creatinine of 1.1 to mg/dl and GFR of 59 on 05/11/2017. EXAM: BILATERAL BREAST MRI WITH AND WITHOUT CONTRAST TECHNIQUE: Multiplanar, multisequence MR images of both breasts were obtained prior to and following the intravenous administration of 18 ml of MultiHance. THREE-DIMENSIONAL MR IMAGE RENDERING ON INDEPENDENT WORKSTATION:  Three-dimensional MR images were rendered by post-processing of the original MR data on an independent workstation. The three-dimensional MR images were interpreted, and findings are reported in the following complete MRI report for this study. Three dimensional images were evaluated at the independent DynaCad workstation COMPARISON:  No prior MRI available for comparison. Correlation made with prior ultrasounds and mammograms. FINDINGS: Breast composition: b. Scattered fibroglandular tissue. Background parenchymal enhancement: Minimal. Right breast: There is an irregular enhancing mass in the central to lower outer quadrant of the right breast which in maximal size in an oblique plane measures up to 6.8 cm. Susceptibility artifact is seen centrally within the mass consistent with the biopsy marking clip. In the true axial plane the measurement of the mass in the anterior to posterior and medial to lateral dimensions are 5.9 x 3.1 cm (measured on the MIP images), and in the sagittal plane, the mass measures 6.2 cm. These measurements include both the dominant mass and the smaller satellite lesion (seen on prior ultrasound) which is along the anterolateral margin of the dominant mass. The mass extends immediately deep to the right nipple, and there is associated nipple retraction, as well as edema and thickening of the periareolar skin. No abnormal enhancement is identified within the skin itself. Left breast: No mass or abnormal enhancement. Lymph nodes: Biopsy changes are seen at an enlarged lymph node in the right axilla, which demonstrated metastatic disease on pathology. No other suspicious right axillary lymph nodes or left axillary lymph nodes are identified. There is an 1.0 cm probable lymph node (measured in the sagittal plane) along the superior aspect of the internal mammary chain (ser 6, img 28 and ser 104, img 189). At the same level, there is a 5-6 mm probable internal mammary lymph node on the left  (measured in sagittal view -  ser 6, img 30). Ancillary findings:  None. IMPRESSION: 1. The biopsy proven triple negative cancer in the lower outer right breast measures up to 6.8 cm in an oblique plane by MRI. There are no additional sites of disease identified in the right breast. 2. The biopsy-proven metastatic lymph node is seen in the right axilla. No additional clearly abnormal right axillary lymph nodes are identified. 3. There is a probable 1 cm internal mammary lymph node on the right, slightly asymmetric to the contralateral side which has a 0.5-0.6 cm probable internal mammary lymph node at a similar location on the left. 4.  No evidence of malignancy in the left breast. RECOMMENDATION: Continue treatment plan. BI-RADS CATEGORY  6: Known biopsy-proven malignancy. Electronically Signed   By: Ammie Ferrier M.D.   On: 05/18/2017 09:27   Dg Chest Port 1 View  Result Date: 05/19/2017 CLINICAL DATA:  Port-A-Cath insertion. EXAM: PORTABLE CHEST 1 VIEW COMPARISON:  CT 05/17/2017. FINDINGS: Port-A-Cath noted with tip over the superior vena cava. Cardiomegaly with normal pulmonary vascularity. No focal infiltrate. No pleural effusion or pneumothorax. No acute bony abnormality. IMPRESSION: 1.  Port-A-Cath noted with tip over superior vena cava. 2. Cardiomegaly with normal pulmonary vascularity. No acute infiltrate. Electronically Signed   By: Marcello Moores  Register   On: 05/19/2017 12:21   Dg Fluoro Guide Cv Line-no Report  Result Date: 05/19/2017 Fluoroscopy was utilized by the requesting physician.  No radiographic interpretation.    ELIGIBLE FOR AVAILABLE RESEARCH PROTOCOL: [facility] SW[facility] [phone]; not a candidate for breast MRI study given history of claustrophobia  ASSESSMENT: 62 y.o. [place] Woman status post right breast upper outer quadrant biopsy 05/02/2017 for a clinical T2 N1-2, stage IIIB invasive ductal carcinoma, grade 2-3, triple negative, with an MIB-180%  (a) breast MRI 05/18/2017 shows T3  N1 disease with possible involvement of the internal mammary nodes  (1) neoadjuvant chemotherapy to consist of doxorubicin and cyclophosphamide in dose dense fashion x4 starting 05/24/2017 to be  followed by carboplatin and paclitaxel weekly x12  (2) definitive surgery to follow  (3) consider SWOG [id number] depending on final pathology results  (4) adjuvant radiation to follow  (5) genetics testing pending  PLAN:  Camiyah is doing well today.  Her CBC is stable and I reviewed that with her, she will proceed with her second cycle of Doxorubicin and Cyclophosphamide (so long as CMET is within parameters).  I reviewed her nausea medications with her.  She is going to stop taking Dexamethasone.  I recommended that she take Omeprazole daily, and we cut the daily Dexamethasone dose in half.  She declines, and states that she just isn't going to take it.  She will take the Compazine and Zofran if needed after the third day.  I cautioned her that this could increase her nausea and possibly cause dehydration, she verbalized understanding.  She is aware that there is Dexamethasone IV in her chemotherapy premedications.  She is going to take Pepcid, and see how she does with The Dexamethasone IV.  She will let us know.    For her thrush, I sent in Diflucan daily x 3 days.  It is very mild at this point.  I put refills on that in case she needs it.  She and I reviewed that we will need appointments the week following chemotherapy with Doxorubicin and Cyclophosphamide to check her labs and see how she is doing.  She will schedule these today.  She and I briefly reviewed her next chemotherapy regimen.  Nikayla will return on Thursday for an injection, and in one week for labs and f/u.  She knows to call for any problems that may develop before that visit.  A total of (30) minutes of face-to-face time was spent with this patient with greater than 50% of that time in counseling and care-coordination.  Wilber Bihari,  NP  06/07/17 9:16 AM Medical Oncology and Hematology [facility] [address] [place], [place] [address] Tel. [phone]    Fax. [phone]

## 2017-06-09 ENCOUNTER — Inpatient Hospital Stay: Payer: BC Managed Care – PPO

## 2017-06-09 VITALS — BP 117/73 | HR 75 | Temp 98.5°F | Resp 20

## 2017-06-09 DIAGNOSIS — C50511 Malignant neoplasm of lower-outer quadrant of right female breast: Secondary | ICD-10-CM

## 2017-06-09 DIAGNOSIS — Z5111 Encounter for antineoplastic chemotherapy: Secondary | ICD-10-CM | POA: Diagnosis not present

## 2017-06-09 DIAGNOSIS — Z171 Estrogen receptor negative status [ER-]: Secondary | ICD-10-CM

## 2017-06-09 MED ORDER — PEGFILGRASTIM-CBQV 6 MG/0.6ML ~~LOC~~ SOSY
PREFILLED_SYRINGE | SUBCUTANEOUS | Status: AC
  Filled 2017-06-09: qty 0.6

## 2017-06-09 MED ORDER — PEGFILGRASTIM-CBQV 6 MG/0.6ML ~~LOC~~ SOSY
6.0000 mg | PREFILLED_SYRINGE | Freq: Once | SUBCUTANEOUS | Status: AC
  Administered 2017-06-09: 6 mg via SUBCUTANEOUS

## 2017-06-09 NOTE — Progress Notes (Signed)
Pt requested second BP to be taken because first was lower than her typical. VS WNL. BP on 05/24/17 was also 121/74, close to both BP today. Pt verbalized some fatigue and discussed her fluid intake. Today has been lower the past few days, but pt plans to drink water she has in the car upon d/c. Pt encouraged to drink fluids and to call and notify MD of extreme fatigue or inability to push oral fluids. Pt verbalized understanding and thanks for the encouragement.

## 2017-06-13 ENCOUNTER — Other Ambulatory Visit: Payer: Self-pay | Admitting: *Deleted

## 2017-06-13 DIAGNOSIS — C50511 Malignant neoplasm of lower-outer quadrant of right female breast: Secondary | ICD-10-CM

## 2017-06-13 DIAGNOSIS — Z171 Estrogen receptor negative status [ER-]: Secondary | ICD-10-CM

## 2017-06-14 ENCOUNTER — Telehealth: Payer: Self-pay | Admitting: Adult Health

## 2017-06-14 ENCOUNTER — Inpatient Hospital Stay (HOSPITAL_BASED_OUTPATIENT_CLINIC_OR_DEPARTMENT_OTHER): Payer: BC Managed Care – PPO | Admitting: Adult Health

## 2017-06-14 ENCOUNTER — Inpatient Hospital Stay: Payer: BC Managed Care – PPO

## 2017-06-14 VITALS — BP 117/73 | HR 100 | Temp 98.6°F | Resp 18 | Ht 63.5 in | Wt 186.8 lb

## 2017-06-14 DIAGNOSIS — C50511 Malignant neoplasm of lower-outer quadrant of right female breast: Secondary | ICD-10-CM

## 2017-06-14 DIAGNOSIS — Z803 Family history of malignant neoplasm of breast: Secondary | ICD-10-CM

## 2017-06-14 DIAGNOSIS — Z79899 Other long term (current) drug therapy: Secondary | ICD-10-CM

## 2017-06-14 DIAGNOSIS — E860 Dehydration: Secondary | ICD-10-CM | POA: Diagnosis not present

## 2017-06-14 DIAGNOSIS — Z171 Estrogen receptor negative status [ER-]: Secondary | ICD-10-CM | POA: Diagnosis not present

## 2017-06-14 DIAGNOSIS — Z808 Family history of malignant neoplasm of other organs or systems: Secondary | ICD-10-CM

## 2017-06-14 DIAGNOSIS — Z5111 Encounter for antineoplastic chemotherapy: Secondary | ICD-10-CM | POA: Diagnosis not present

## 2017-06-14 LAB — CBC WITH DIFFERENTIAL (CANCER CENTER ONLY)
Basophils Absolute: 0.1 10*3/uL (ref 0.0–0.1)
Basophils Relative: 2 %
Eosinophils Absolute: 0 10*3/uL (ref 0.0–0.5)
Eosinophils Relative: 0 %
HCT: 31.4 % — ABNORMAL LOW (ref 34.8–46.6)
Hemoglobin: 10 g/dL — ABNORMAL LOW (ref 11.6–15.9)
Lymphocytes Relative: 19 %
Lymphs Abs: 0.5 10*3/uL — ABNORMAL LOW (ref 0.9–3.3)
MCH: 24.5 pg — ABNORMAL LOW (ref 25.1–34.0)
MCHC: 31.9 g/dL (ref 31.5–36.0)
MCV: 76.7 fL — ABNORMAL LOW (ref 79.5–101.0)
Monocytes Absolute: 0.3 10*3/uL (ref 0.1–0.9)
Monocytes Relative: 12 %
Neutro Abs: 1.6 10*3/uL (ref 1.5–6.5)
Neutrophils Relative %: 67 %
Platelet Count: 190 10*3/uL (ref 145–400)
RBC: 4.1 MIL/uL (ref 3.70–5.45)
RDW: 14.1 % (ref 11.2–14.5)
WBC Count: 2.4 10*3/uL — ABNORMAL LOW (ref 3.9–10.3)

## 2017-06-14 LAB — CMP (CANCER CENTER ONLY)
ALT: 11 U/L (ref 0–55)
AST: 14 U/L (ref 5–34)
Albumin: 3.6 g/dL (ref 3.5–5.0)
Alkaline Phosphatase: 106 U/L (ref 40–150)
Anion gap: 9 (ref 3–11)
BUN: 26 mg/dL (ref 7–26)
CO2: 28 mmol/L (ref 22–29)
Calcium: 9.5 mg/dL (ref 8.4–10.4)
Chloride: 102 mmol/L (ref 98–109)
Creatinine: 1.07 mg/dL (ref 0.60–1.10)
GFR, Est AFR Am: >60 mL/min (ref >60)
GFR, Estimated: 54 mL/min — ABNORMAL LOW (ref >60)
Glucose, Bld: 96 mg/dL (ref 70–140)
Potassium: 3.7 mmol/L (ref 3.5–5.1)
Sodium: 139 mmol/L (ref 136–145)
Total Bilirubin: 0.5 mg/dL (ref 0.2–1.2)
Total Protein: 7.2 g/dL (ref 6.4–8.3)

## 2017-06-14 NOTE — Progress Notes (Signed)
[place]  [phone] Fax:(336) [phone]     ID: Emily Perez DOB: 1956/02/19  MR#: [id number]  JQB#:[id number]  Patient Care Team: Shirline Frees, MD as PCP - General (Family Medicine) Magrinat, Virgie Dad, MD as Consulting Physician (Oncology) Rolm Bookbinder, MD as Consulting Physician (General Surgery) Gery Pray, MD as Consulting Physician (Radiation Oncology) Allyn Kenner, MD (Dermatology) Thurnell Lose, MD as Consulting Physician (Obstetrics and Gynecology) Marchia Bond, MD as Consulting Physician (Orthopedic Surgery) Christene Slates, MD as Physician Assistant (Radiology) OTHER MD:  CHIEF COMPLAINT: Triple negative breast cancer  CURRENT TREATMENT: Neoadjuvant chemotherapy   INTERVAL HISTORY: [name] returns today for follow up and treatment of her triple negative breast cancer accompanied by her daughter.  On 05/25/2017, she started neoadjuvant chemotherapy with Doxorubicin and Cyclophosphamide dose dense--even every 14 days x4, to be followed by weekly paclitaxel x12.  Today is cycle 2 day 8 of treatment.  REVIEW OF SYSTEMS: Emily Perez is doing moderately well today.  She did not take Dexamethasone after receiving her chemotherapy, and she did not have the stomach pain and reflux like she did after cycle 1.  She did however have some increased sluggishness.  She also has some taste changes and is struggling to eat and drink.  She has a decreased appetite.  She noted some ear pain a few days ago, and had an ear gtt that she was prescribed 2 weeks ago.  Once she started using it, she noted the pain resolved.  She wants to know if we will refill it, because it is almost out.  She denies any other issues such as fevers, chills, nausea, vomiting, chest pain, cough, shortness of breath, or any further concerns.     HISTORY OF CURRENT ILLNESS: Emily Perez had routine bilateral screening mammography at [facility] on 04/19/2017 showing a possible  abnormality in the right breast. She underwent unilateral right diagnostic mammography with tomography and right breast ultrasonography at [facility] on 04/25/2017 showing: breast density category B.  In the right breast at the 6:00 radiant 2 cm from the nipple there was a 2 cm oval mass which by ultrasound measured 2.5 cm and was irregular and hypoechoic.  There were also at least 3 abnormal appearing lymph nodes in the right axilla.  Accordingly on 05/02/2017 she proceeded to biopsy of the right breast mass in question and lymph node sampling. The pathology from this procedure showed ([id number]): Invasive ductal carcinoma grade II. One lymph node positive for metastatic carcinoma (1/1). Prognostic indicators significant for: estrogen receptor, 0% negative and progesterone receptor, 0% negative. Proliferation marker Ki67 at 80%. HER2 not amplified with ratios HER2/CEP17 signals 1.29 and average copies per cell 2.25  The patient's subsequent history is as detailed below.    PAST MEDICAL HISTORY: Past Medical History:  Diagnosis Date  . Arthritis   . Headache    migraines in the past  . Hypertension   . PONV (postoperative nausea and vomiting)    after 1st colonoscopy  Migraines in the past.   PAST SURGICAL HISTORY: Past Surgical History:  Procedure Laterality Date  . COLONOSCOPY    . FRACTURE SURGERY Left    wrist  . PORTACATH PLACEMENT Right 05/19/2017   Procedure: INSERTION PORT-A-CATH WITH ULTRASOUND;  Surgeon: Rolm Bookbinder, MD;  Location: [place];  Service: General;  Laterality: Right;    Wrist Fracture. Torn Rotator cuff and torn meniscus.    FAMILY HISTORY Family History  Problem Relation Age of Onset  . Heart disease Mother   .  Cancer Father   . Diabetes Father   . Hypertension Father   . Hyperlipidemia Father   . Heart disease Brother   . Hypertension Brother   . Hyperlipidemia Brother   . Diabetes Brother   . Cancer Sister        breast  . Breast cancer Sister     The patient's father died at age 44 due to heart disease and emphysema. The patient's mother died at age 60 due to heart disease. The patient has 6 brothers and 8 sisters. She notes 2 sisters with breast cancer. The 1st sister was diagnosed at age 35, and the 2nd sister was diagnosed at age 73. She notes that both sisters are alive. She also notes a paternal first cousin with breast cancer diagnosed in her 33's. The patient notes that her father also had bone cancer, but she's unsure if he had myeloma. She denies a family history of ovarian cancer.   GYNECOLOGIC HISTORY:  No LMP recorded. Patient is postmenopausal. 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 She is divorced. She lives by herself with no pets. The patient's daughter, Emily Perez, works in Therapist, art for [employer]. The patient's son, Emily Perez,  works for [employer]. The patient has  2 grandchildren. She belongs to [facility].      ADVANCED DIRECTIVES: Not in place.  At the 05/19/2017 visit the patient was given the appropriate documents to complete and notarized at her discretion   HEALTH MAINTENANCE: Social History   Tobacco Use  . Smoking status: Never Smoker  . Smokeless tobacco: Never Used  Substance Use Topics  . Alcohol use: No  . Drug use: No     Colonoscopy:   PAP: September 2018 normal  Bone density: none   Allergies  Allergen Reactions  . Latex Hives and Rash    Current Outpatient Medications  Medication Sig Dispense Refill  . acetaminophen (TYLENOL) 160 MG/5ML elixir Take 15 mg/kg by mouth every 4 (four) hours as needed for fever.    Marland Kitchen aspirin 81 MG tablet Take 81 mg by mouth daily.    . Camphor-Eucalyptus-Menthol (VICKS VAPORUB EX) Apply 1 application topically as needed (colds or bug bites).    Marland Kitchen  dexamethasone (DECADRON) 4 MG tablet Take 2 tablets by mouth once a day on the day after chemotherapy and then take 2 tablets two times a day for 2 days. Take with food. 30 tablet 1  . diphenhydramine-acetaminophen (TYLENOL PM) 25-500 MG TABS tablet Take 1 tablet by mouth at bedtime as needed.    . famotidine (PEPCID) 20 MG tablet Take 1 tablet (20 mg total) by mouth 2 (two) times daily. 60 tablet 3  . fluconazole (DIFLUCAN) 200 MG tablet Take 1 tablet (200 mg total) by mouth daily. 3 tablet 5  . fluocinonide cream (LIDEX) 8.36 % Apply 1 application topically 2 (two) times daily as needed (rash).    . halobetasol (ULTRAVATE) 0.05 % cream Apply 1 application topically 2 (two) times daily as needed (rash).    Marland Kitchen lidocaine-prilocaine (EMLA) cream Apply to affected area once 30 g 3  . LORazepam (ATIVAN) 0.5 MG tablet Take 1 tablet (0.5 mg total) by mouth at bedtime as needed (Nausea or vomiting). 30 tablet 0  . meclizine (ANTIVERT) 12.5 MG tablet Take 6.25 mg by  mouth 3 (three) times daily as needed for dizziness.    . naproxen sodium (ALEVE) 220 MG tablet Take 110-220 mg by mouth daily as needed (pain). Takes 0.5-1 tablet as needed for pain    . ondansetron (ZOFRAN-ODT) 8 MG disintegrating tablet Take 8 mg by mouth every 8 (eight) hours as needed for nausea or vomiting.    Marland Kitchen OVER THE COUNTER MEDICATION Apply 1 application topically at bedtime. Pain Relief Balm    . oxyCODONE (OXY IR/ROXICODONE) 5 MG immediate release tablet Take 1 tablet (5 mg total) by mouth every 6 (six) hours as needed for moderate pain, severe pain or breakthrough pain. 10 tablet 0  . prochlorperazine (COMPAZINE) 10 MG tablet Take 1 tablet (10 mg total) by mouth every 6 (six) hours as needed (Nausea or vomiting). 30 tablet 1  . tiZANidine (ZANAFLEX) 4 MG tablet TAKE 0.5-1 TABLET AS NEEDED FOR MUSCLE SPASMS  1  . triamterene-hydrochlorothiazide (DYAZIDE) 37.5-25 MG capsule Take 1 capsule by mouth daily.  3  . vitamin B-12  (CYANOCOBALAMIN) 1000 MCG tablet Take 500 mcg by mouth daily. Takes 0.5 tablet     No current facility-administered medications for this visit.     OBJECTIVE: Vitals:   06/14/17 1322  BP: 117/73  Pulse: 100  Resp: 18  Temp: 98.6 F (37 C)  SpO2: 98%     Body mass index is 32.57 kg/m.   Wt Readings from Last 3 Encounters:  06/14/17 186 lb 12.8 oz (84.7 kg)  06/07/17 189 lb 8 oz (86 kg)  05/20/17 189 lb 12.8 oz (86.1 kg)    ECOG FS:1 GENERAL: Patient is a well appearing female in no acute distress HEENT:  Sclerae anicteric.  Oropharynx moist, no ulceration, or oropharyngeal candidiasis noted. Neck is supple. TM are normal bilaterally, however ear canals appear irritated, the left canal has a small crack noted  NODES:  No cervical, supraclavicular, or axillary lymphadenopathy palpated.  BREAST EXAM:  Deferred. LUNGS:  Clear to auscultation bilaterally.  No wheezes or rhonchi. HEART:  Regular rate and rhythm. No murmur appreciated. ABDOMEN:  Soft, nontender.  Positive, normoactive bowel sounds. No organomegaly palpated. MSK:  No focal spinal tenderness to palpation. Full range of motion bilaterally in the upper extremities. EXTREMITIES:  No peripheral edema.   SKIN:  Clear with no obvious rashes or skin changes. No nail dyscrasia. NEURO:  Nonfocal. Well oriented.  Appropriate affect.    LAB RESULTS:  CMP     Component Value Date/Time   NA 141 06/07/2017 0855   K 3.9 06/07/2017 0855   CL 106 06/07/2017 0855   CO2 27 06/07/2017 0855   GLUCOSE 80 06/07/2017 0855   BUN 24 06/07/2017 0855   CREATININE 1.09 06/07/2017 0855   CALCIUM 9.2 06/07/2017 0855   PROT 7.2 06/07/2017 0855   ALBUMIN 3.4 (L) 06/07/2017 0855   AST 13 06/07/2017 0855   ALT 10 06/07/2017 0855   ALKPHOS 87 06/07/2017 0855   BILITOT <0.2 (L) 06/07/2017 0855   GFRNONAA 53 (L) 06/07/2017 0855   GFRAA >60 06/07/2017 0855    No results found for: TOTALPROTELP, ALBUMINELP, A1GS, A2GS, BETS, BETA2SER,  GAMS, MSPIKE, SPEI  No results found for: KPAFRELGTCHN, LAMBDASER, KAPLAMBRATIO  Lab Results  Component Value Date   WBC 2.4 (L) 06/14/2017   NEUTROABS 1.6 06/14/2017   HGB 11.5 (L) 08/12/2012   HCT 31.4 (L) 06/14/2017   MCV 76.7 (L) 06/14/2017   PLT 190 06/14/2017    _0 @  No results found  for: LABCA2  No components found for: AQTMAU633  No results for input(s): INR in the last 168 hours.  No results found for: LABCA2  No results found for: HLK562  No results found for: BWL893  No results found for: TDS287  No results found for: CA2729  No components found for: HGQUANT  No results found for: CEA1 / No results found for: CEA1   No results found for: AFPTUMOR  No results found for: CHROMOGRNA  No results found for: PSA1  Appointment on 06/14/2017  Component Date Value Ref Range Status  . WBC Count 06/14/2017 2.4* 3.9 - 10.3 K/uL Final  . RBC 06/14/2017 4.10  3.70 - 5.45 MIL/uL Final  . Hemoglobin 06/14/2017 10.0* 11.6 - 15.9 g/dL Final  . HCT 06/14/2017 31.4* 34.8 - 46.6 % Final  . MCV 06/14/2017 76.7* 79.5 - 101.0 fL Final  . MCH 06/14/2017 24.5* 25.1 - 34.0 pg Final  . MCHC 06/14/2017 31.9  31.5 - 36.0 g/dL Final  . RDW 06/14/2017 14.1  11.2 - 14.5 % Final  . Platelet Count 06/14/2017 190  145 - 400 K/uL Final  . Neutrophils Relative % 06/14/2017 67  % Final  . Neutro Abs 06/14/2017 1.6  1.5 - 6.5 K/uL Final  . Lymphocytes Relative 06/14/2017 19  % Final  . Lymphs Abs 06/14/2017 0.5* 0.9 - 3.3 K/uL Final  . Monocytes Relative 06/14/2017 12  % Final  . Monocytes Absolute 06/14/2017 0.3  0.1 - 0.9 K/uL Final  . Eosinophils Relative 06/14/2017 0  % Final  . Eosinophils Absolute 06/14/2017 0.0  0.0 - 0.5 K/uL Final  . Basophils Relative 06/14/2017 2  % Final  . Basophils Absolute 06/14/2017 0.1  0.0 - 0.1 K/uL Final   Performed at [facility], [place] [address], [place], [place] [address]    (this displays the last labs  from the last 3 days)  No results found for: TOTALPROTELP, ALBUMINELP, A1GS, A2GS, BETS, BETA2SER, GAMS, MSPIKE, SPEI (this displays SPEP labs)  No results found for: KPAFRELGTCHN, LAMBDASER, KAPLAMBRATIO (kappa/lambda light chains)  No results found for: HGBA, HGBA2QUANT, HGBFQUANT, HGBSQUAN (Hemoglobinopathy evaluation)   No results found for: LDH  No results found for: IRON, TIBC, IRONPCTSAT (Iron and TIBC)  No results found for: FERRITIN  Urinalysis No results found for: COLORURINE, APPEARANCEUR, LABSPEC, PHURINE, GLUCOSEU, HGBUR, BILIRUBINUR, KETONESUR, PROTEINUR, UROBILINOGEN, NITRITE, LEUKOCYTESUR   STUDIES: Ct Chest W Contrast  Result Date: 05/18/2017 CLINICAL DATA:  Newly diagnosed right lower outer quadrant breast carcinoma. Staging. EXAM: CT CHEST WITH CONTRAST TECHNIQUE: Multidetector CT imaging of the chest was performed during intravenous contrast administration. CONTRAST:  13m ISOVUE-300 IOPAMIDOL (ISOVUE-300) INJECTION 61% COMPARISON:  None. FINDINGS: Cardiovascular: No acute findings. Aortic and coronary artery atherosclerosis. Mediastinum/Nodes: Mild right axillary and subpectoral lymphadenopathy, largest lymph node in right axilla measuring 11 mm on image 38/2. 3 cm right breast soft tissue mass is seen with overlying skin thickening, consistent with known primary breast carcinoma. No mediastinal or hilar lymphadenopathy identified. Lungs/Pleura: No pulmonary infiltrate or mass identified. No effusion present. Upper Abdomen:  Unremarkable. Musculoskeletal:  No suspicious bone lesions. IMPRESSION: 3 cm right breast soft tissue mass with overlying skin thickening, consistent with known primary breast carcinoma. Mild right axillary and subpectoral lymphadenopathy. No other sites of metastatic disease within the thorax. Aortic Atherosclerosis (ICD10-I70.0). Coronary artery calcification. Electronically Signed   By: JEarle GellM.D.   On: 05/18/2017 11:46   Nm Bone Scan  Whole Body  Result Date:  05/18/2017 CLINICAL DATA:  Breast cancer, no bone pain or recent trauma EXAM: NUCLEAR MEDICINE WHOLE BODY BONE SCAN TECHNIQUE: Whole body anterior and posterior images were obtained approximately 3 hours after intravenous injection of radiopharmaceutical. RADIOPHARMACEUTICALS:  20.7 mCi Technetium-67mMDP IV COMPARISON:  None Radiographic correlation: CT chest 05/18/2017 FINDINGS: Uptake at the shoulders, knees especially RIGHT, and feet, typically degenerative. No abnormal osseous tracer accumulation is identified which is suspicious for osseous metastatic disease. Minimal nonspecific tracer localization within the breast greater on RIGHT. Urinary tract and soft tissue distribution of tracer is otherwise unremarkable. IMPRESSION: No scintigraphic evidence of osseous metastatic disease. Electronically Signed   By: MLavonia DanaM.D.   On: 05/18/2017 14:35   Mr Breast Bilateral W Wo Contrast Inc Cad  Result Date: 05/18/2017 CLINICAL DATA:  62year old female newly diagnosed with right breast cancer metastatic to the right axillary lymph node, presenting for initial imaging prior to initiation of chemotherapy. The patient was diagnosed in January of 2019 with a triple negative breast cancer in the right breast. She has family history of breast cancer in 2 sisters who were diagnosed at age 770and 630 LABS:  Creatinine of 1.1 to mg/dl and GFR of 59 on 05/11/2017. EXAM: BILATERAL BREAST MRI WITH AND WITHOUT CONTRAST TECHNIQUE: Multiplanar, multisequence MR images of both breasts were obtained prior to and following the intravenous administration of 18 ml of MultiHance. THREE-DIMENSIONAL MR IMAGE RENDERING ON INDEPENDENT WORKSTATION: Three-dimensional MR images were rendered by post-processing of the original MR data on an independent workstation. The three-dimensional MR images were interpreted, and findings are reported in the following complete MRI report for this study. Three dimensional  images were evaluated at the independent DynaCad workstation COMPARISON:  No prior MRI available for comparison. Correlation made with prior ultrasounds and mammograms. FINDINGS: Breast composition: b. Scattered fibroglandular tissue. Background parenchymal enhancement: Minimal. Right breast: There is an irregular enhancing mass in the central to lower outer quadrant of the right breast which in maximal size in an oblique plane measures up to 6.8 cm. Susceptibility artifact is seen centrally within the mass consistent with the biopsy marking clip. In the true axial plane the measurement of the mass in the anterior to posterior and medial to lateral dimensions are 5.9 x 3.1 cm (measured on the MIP images), and in the sagittal plane, the mass measures 6.2 cm. These measurements include both the dominant mass and the smaller satellite lesion (seen on prior ultrasound) which is along the anterolateral margin of the dominant mass. The mass extends immediately deep to the right nipple, and there is associated nipple retraction, as well as edema and thickening of the periareolar skin. No abnormal enhancement is identified within the skin itself. Left breast: No mass or abnormal enhancement. Lymph nodes: Biopsy changes are seen at an enlarged lymph node in the right axilla, which demonstrated metastatic disease on pathology. No other suspicious right axillary lymph nodes or left axillary lymph nodes are identified. There is an 1.0 cm probable lymph node (measured in the sagittal plane) along the superior aspect of the internal mammary chain (ser 6, img 28 and ser 104, img 189). At the same level, there is a 5-6 mm probable internal mammary lymph node on the left (measured in sagittal view - ser 6, img 30). Ancillary findings:  None. IMPRESSION: 1. The biopsy proven triple negative cancer in the lower outer right breast measures up to 6.8 cm in an oblique plane by MRI. There are no additional sites of disease  identified in  the right breast. 2. The biopsy-proven metastatic lymph node is seen in the right axilla. No additional clearly abnormal right axillary lymph nodes are identified. 3. There is a probable 1 cm internal mammary lymph node on the right, slightly asymmetric to the contralateral side which has a 0.5-0.6 cm probable internal mammary lymph node at a similar location on the left. 4.  No evidence of malignancy in the left breast. RECOMMENDATION: Continue treatment plan. BI-RADS CATEGORY  6: Known biopsy-proven malignancy. Electronically Signed   By: Ammie Ferrier M.D.   On: 05/18/2017 09:27   Dg Chest Port 1 View  Result Date: 05/19/2017 CLINICAL DATA:  Port-A-Cath insertion. EXAM: PORTABLE CHEST 1 VIEW COMPARISON:  CT 05/17/2017. FINDINGS: Port-A-Cath noted with tip over the superior vena cava. Cardiomegaly with normal pulmonary vascularity. No focal infiltrate. No pleural effusion or pneumothorax. No acute bony abnormality. IMPRESSION: 1.  Port-A-Cath noted with tip over superior vena cava. 2. Cardiomegaly with normal pulmonary vascularity. No acute infiltrate. Electronically Signed   By: Marcello Moores  Register   On: 05/19/2017 12:21   Dg Fluoro Guide Cv Line-no Report  Result Date: 05/19/2017 Fluoroscopy was utilized by the requesting physician.  No radiographic interpretation.    ELIGIBLE FOR AVAILABLE RESEARCH PROTOCOL: [facility] SWOG [phone]; not a candidate for breast MRI study given history of claustrophobia  ASSESSMENT: 62 y.o. [place] Woman status post right breast upper outer quadrant biopsy 05/02/2017 for a clinical T2 N1-2, stage IIIB invasive ductal carcinoma, grade 2-3, triple negative, with an MIB-180%  (a) breast MRI 05/18/2017 shows T3 N1 disease with possible involvement of the internal mammary nodes  (1) neoadjuvant chemotherapy to consist of doxorubicin and cyclophosphamide in dose dense fashion x4 starting 05/24/2017 to be  followed by carboplatin and paclitaxel weekly x12  (2) definitive  surgery to follow  (3) consider SWOG [id number] depending on final pathology results  (4) adjuvant radiation to follow  (5) genetics testing pending  PLAN:  Isys tolerated her second cycle of Doxorubicin and cyclophosphamide moderately well.  Her WBC are slightly decreased, however her [place] is normal today.  I reviewed her labs with her in detail.  I reviewed with her that the sluggish feeling could be related to not taking the Dexamethasone following chemotherapy.  We also discussed the flavoring of her foods to try to help them become more palatable.  I reviewed that while her kidney function is normal today, her blood pressure is slightly low.  She is only drinking 32 ounces of water per day.  She is slightly dehydrated.  I offered to give her IV fluids today, however she declined and states she will continue to work on her fluid intake.  Finally, we reviewed the situation with her ears.  I am happy to refill the ear gtts.  She will let us know what the gtts are, and I will refill them for her.    Clio will return in one week for labs, f/u and her next cycle of neoadjuvant chemotherapy with Doxorubicin and Cyclophosphamide.    She knows to call for any problems that may develop before her next visit with [place].  A total of (30) minutes of face-to-face time was spent with this patient with greater than 50% of that time in counseling and care-coordination.  Wilber Bihari, NP  06/14/17 1:23 PM Medical Oncology and Hematology [facility] [address] [place], [place] [address] Tel. [phone]    Fax. [phone]

## 2017-06-14 NOTE — Telephone Encounter (Signed)
Gave patient AVs and calendar of upcoming April appointments.  °

## 2017-06-15 ENCOUNTER — Encounter: Payer: Self-pay | Admitting: Adult Health

## 2017-06-16 MED ORDER — MAGIC MOUTHWASH W/LIDOCAINE: 5.0000 mL | Freq: Three times a day (TID) | ORAL | 0 refills | Status: DC | PRN

## 2017-06-16 NOTE — Telephone Encounter (Signed)
Returned pt call regarding mouth irritation. Was unable to reach patient. Left VM for patient informing her I would send in a prescription for a mouth wash for her to her pharmacy listed. She is to call back with any questions/concerns.  Cyndia Bent RN

## 2017-06-17 ENCOUNTER — Telehealth: Payer: Self-pay

## 2017-06-17 ENCOUNTER — Other Ambulatory Visit: Payer: Self-pay | Admitting: Adult Health

## 2017-06-17 NOTE — Telephone Encounter (Signed)
Patient called inquiring about medications that had been called in to her pharmacy.  She stated that she needed clarification on what was called in and what she should be taking.  Patient also reminded nurse that NP said she would call in ear drops for her as well d/t ear pain.    Nurse spoke with pt pharmacy to call in ear drops per NP and clarify meds for pt.  Call made to pt to inform that she only had magic mouthwash and eardrops to pick up from pharmacy today.  Pt voiced understanding and knows to call center with questions/concerns.

## 2017-06-21 ENCOUNTER — Other Ambulatory Visit: Payer: Self-pay

## 2017-06-21 DIAGNOSIS — Z171 Estrogen receptor negative status [ER-]: Secondary | ICD-10-CM

## 2017-06-21 DIAGNOSIS — C50511 Malignant neoplasm of lower-outer quadrant of right female breast: Secondary | ICD-10-CM

## 2017-06-22 ENCOUNTER — Encounter: Payer: Self-pay | Admitting: Genetic Counselor

## 2017-06-22 ENCOUNTER — Inpatient Hospital Stay (HOSPITAL_BASED_OUTPATIENT_CLINIC_OR_DEPARTMENT_OTHER): Payer: BC Managed Care – PPO | Admitting: Adult Health

## 2017-06-22 ENCOUNTER — Inpatient Hospital Stay: Payer: BC Managed Care – PPO

## 2017-06-22 ENCOUNTER — Inpatient Hospital Stay: Payer: BC Managed Care – PPO | Attending: Genetic Counselor | Admitting: Genetic Counselor

## 2017-06-22 ENCOUNTER — Encounter: Payer: Self-pay | Admitting: Adult Health

## 2017-06-22 VITALS — BP 145/86 | HR 102 | Temp 98.5°F | Resp 18

## 2017-06-22 DIAGNOSIS — C50411 Malignant neoplasm of upper-outer quadrant of right female breast: Secondary | ICD-10-CM | POA: Diagnosis present

## 2017-06-22 DIAGNOSIS — Z171 Estrogen receptor negative status [ER-]: Secondary | ICD-10-CM | POA: Diagnosis not present

## 2017-06-22 DIAGNOSIS — C50511 Malignant neoplasm of lower-outer quadrant of right female breast: Secondary | ICD-10-CM

## 2017-06-22 DIAGNOSIS — Z79899 Other long term (current) drug therapy: Secondary | ICD-10-CM | POA: Diagnosis not present

## 2017-06-22 DIAGNOSIS — E860 Dehydration: Secondary | ICD-10-CM | POA: Diagnosis not present

## 2017-06-22 DIAGNOSIS — Z8042 Family history of malignant neoplasm of prostate: Secondary | ICD-10-CM | POA: Diagnosis not present

## 2017-06-22 DIAGNOSIS — Z5111 Encounter for antineoplastic chemotherapy: Secondary | ICD-10-CM | POA: Insufficient documentation

## 2017-06-22 DIAGNOSIS — R531 Weakness: Secondary | ICD-10-CM | POA: Insufficient documentation

## 2017-06-22 DIAGNOSIS — Z5189 Encounter for other specified aftercare: Secondary | ICD-10-CM | POA: Insufficient documentation

## 2017-06-22 DIAGNOSIS — Z7982 Long term (current) use of aspirin: Secondary | ICD-10-CM | POA: Diagnosis not present

## 2017-06-22 DIAGNOSIS — Z803 Family history of malignant neoplasm of breast: Secondary | ICD-10-CM

## 2017-06-22 DIAGNOSIS — R530 Neoplastic (malignant) related fatigue: Secondary | ICD-10-CM | POA: Diagnosis not present

## 2017-06-22 DIAGNOSIS — C773 Secondary and unspecified malignant neoplasm of axilla and upper limb lymph nodes: Secondary | ICD-10-CM | POA: Diagnosis not present

## 2017-06-22 DIAGNOSIS — D649 Anemia, unspecified: Secondary | ICD-10-CM | POA: Diagnosis not present

## 2017-06-22 DIAGNOSIS — Z95828 Presence of other vascular implants and grafts: Secondary | ICD-10-CM

## 2017-06-22 DIAGNOSIS — D701 Agranulocytosis secondary to cancer chemotherapy: Secondary | ICD-10-CM | POA: Insufficient documentation

## 2017-06-22 LAB — CMP (CANCER CENTER ONLY)
ALT: 9 U/L (ref 0–55)
AST: 16 U/L (ref 5–34)
Albumin: 3.5 g/dL (ref 3.5–5.0)
Alkaline Phosphatase: 88 U/L (ref 40–150)
Anion gap: 9 (ref 3–11)
BUN: 18 mg/dL (ref 7–26)
CO2: 27 mmol/L (ref 22–29)
Calcium: 9.3 mg/dL (ref 8.4–10.4)
Chloride: 105 mmol/L (ref 98–109)
Creatinine: 0.99 mg/dL (ref 0.60–1.10)
GFR, Est AFR Am: >60 mL/min (ref >60)
GFR, Estimated: 59 mL/min — ABNORMAL LOW (ref >60)
Glucose, Bld: 93 mg/dL (ref 70–140)
Potassium: 3.7 mmol/L (ref 3.5–5.1)
Sodium: 141 mmol/L (ref 136–145)
Total Bilirubin: <0.2 mg/dL — ABNORMAL LOW (ref 0.2–1.2)
Total Protein: 6.7 g/dL (ref 6.4–8.3)

## 2017-06-22 LAB — CBC WITH DIFFERENTIAL (CANCER CENTER ONLY)
Basophils Absolute: 0 10*3/uL (ref 0.0–0.1)
Basophils Relative: 0 %
Eosinophils Absolute: 0 10*3/uL (ref 0.0–0.5)
Eosinophils Relative: 0 %
HCT: 28.4 % — ABNORMAL LOW (ref 34.8–46.6)
Hemoglobin: 9.1 g/dL — ABNORMAL LOW (ref 11.6–15.9)
Lymphocytes Relative: 8 %
Lymphs Abs: 1 10*3/uL (ref 0.9–3.3)
MCH: 24.5 pg — ABNORMAL LOW (ref 25.1–34.0)
MCHC: 32 g/dL (ref 31.5–36.0)
MCV: 76.5 fL — ABNORMAL LOW (ref 79.5–101.0)
Monocytes Absolute: 1.9 10*3/uL — ABNORMAL HIGH (ref 0.1–0.9)
Monocytes Relative: 16 %
Neutro Abs: 9.2 10*3/uL — ABNORMAL HIGH (ref 1.5–6.5)
Neutrophils Relative %: 76 %
Platelet Count: 192 10*3/uL (ref 145–400)
RBC: 3.71 MIL/uL (ref 3.70–5.45)
RDW: 14.5 % (ref 11.2–14.5)
WBC Count: 12.1 10*3/uL — ABNORMAL HIGH (ref 3.9–10.3)

## 2017-06-22 MED ORDER — SODIUM CHLORIDE 0.9 % IV SOLN
Freq: Once | INTRAVENOUS | Status: AC
  Administered 2017-06-22: 11:00:00 via INTRAVENOUS

## 2017-06-22 MED ORDER — SODIUM CHLORIDE 0.9% FLUSH
10.0000 mL | INTRAVENOUS | Status: DC | PRN
  Administered 2017-06-22: 10 mL via INTRAVENOUS
  Filled 2017-06-22: qty 10

## 2017-06-22 MED ORDER — SODIUM CHLORIDE 0.9 % IV SOLN
600.0000 mg/m2 | Freq: Once | INTRAVENOUS | Status: AC
  Administered 2017-06-22: 1180 mg via INTRAVENOUS
  Filled 2017-06-22: qty 59

## 2017-06-22 MED ORDER — SODIUM CHLORIDE 0.9 % IV SOLN
Freq: Once | INTRAVENOUS | Status: AC
  Administered 2017-06-22: 11:00:00 via INTRAVENOUS
  Filled 2017-06-22: qty 5

## 2017-06-22 MED ORDER — DOXORUBICIN HCL CHEMO IV INJECTION 2 MG/ML
60.0000 mg/m2 | Freq: Once | INTRAVENOUS | Status: AC
  Administered 2017-06-22: 118 mg via INTRAVENOUS
  Filled 2017-06-22: qty 59

## 2017-06-22 MED ORDER — PALONOSETRON HCL INJECTION 0.25 MG/5ML
INTRAVENOUS | Status: AC
  Filled 2017-06-22: qty 5

## 2017-06-22 MED ORDER — SODIUM CHLORIDE 0.9% FLUSH
10.0000 mL | INTRAVENOUS | Status: DC | PRN
  Administered 2017-06-22: 10 mL
  Filled 2017-06-22: qty 10

## 2017-06-22 MED ORDER — HEPARIN SOD (PORK) LOCK FLUSH 100 UNIT/ML IV SOLN
500.0000 [IU] | Freq: Once | INTRAVENOUS | Status: AC | PRN
Start: 2017-06-22 — End: 2017-06-22
  Administered 2017-06-22: 500 [IU]
  Filled 2017-06-22: qty 5

## 2017-06-22 MED ORDER — PALONOSETRON HCL INJECTION 0.25 MG/5ML
0.2500 mg | Freq: Once | INTRAVENOUS | Status: AC
  Administered 2017-06-22: 0.25 mg via INTRAVENOUS

## 2017-06-22 NOTE — Progress Notes (Signed)
[place]  [phone] [phone]     ID: Emily Perez DOB: 04/20/1955  MR#: [id number]  EGB#:[id number]  Patient Care Team: Shirline Frees, MD as PCP - General (Family Medicine) Magrinat, Virgie Dad, MD as Consulting Physician (Oncology) Rolm Bookbinder, MD as Consulting Physician (General Surgery) Gery Pray, MD as Consulting Physician (Radiation Oncology) Allyn Kenner, MD (Dermatology) Thurnell Lose, MD as Consulting Physician (Obstetrics and Gynecology) Marchia Bond, MD as Consulting Physician (Orthopedic Surgery) Christene Slates, MD as Physician Assistant (Radiology) OTHER MD:  CHIEF COMPLAINT: Triple negative breast cancer  CURRENT TREATMENT: Neoadjuvant chemotherapy   INTERVAL HISTORY: Emily Perez returns today for follow up and treatment of her triple negative breast cancer accompanied by her daughter.  On 05/25/2017, she started neoadjuvant chemotherapy with Doxorubicin and Cyclophosphamide dose dense--even every 14 days x4, to be followed by weekly paclitaxel x12.  Today is cycle 3 day 1 of Doxorubicin and Cyclophosphamide.    REVIEW OF SYSTEMS: Emily Perez tells me that she is not in a good mood today.  She is feeling pretty well.  She denies any issues with fevers, chills, nausea, vomiting, headaches, vision issues, chest pain, palpitations, or any other concerns.  She does want me to work on getting the rest of her chemotherapy scheduled.  A detailed ROS is otherwise non contributory.     HISTORY OF CURRENT ILLNESS: Emily Perez had routine bilateral screening mammography at [facility] on 04/19/2017 showing a possible abnormality in the right breast. She underwent unilateral right diagnostic mammography with tomography and right breast ultrasonography at [facility] on 04/25/2017 showing: breast density category B.  In the right breast at the 6:00 radiant 2 cm from the nipple there was a 2 cm oval mass which by ultrasound measured 2.5 cm and  was irregular and hypoechoic.  There were also at least 3 abnormal appearing lymph nodes in the right axilla.  Accordingly on 05/02/2017 she proceeded to biopsy of the right breast mass in question and lymph node sampling. The pathology from this procedure showed ([id number]): Invasive ductal carcinoma grade II. One lymph node positive for metastatic carcinoma (1/1). Prognostic indicators significant for: estrogen receptor, 0% negative and progesterone receptor, 0% negative. Proliferation marker Ki67 at 80%. HER2 not amplified with ratios HER2/CEP17 signals 1.29 and average copies per cell 2.25  The patient's subsequent history is as detailed below.    PAST MEDICAL HISTORY: Past Medical History:  Diagnosis Date  . Arthritis   . Family history of breast cancer   . Family history of prostate cancer   . Headache    migraines in the past  . Hypertension   . PONV (postoperative nausea and vomiting)    after 1st colonoscopy  Migraines in the past.   PAST SURGICAL HISTORY: Past Surgical History:  Procedure Laterality Date  . COLONOSCOPY    . FRACTURE SURGERY Left    wrist  . PORTACATH PLACEMENT Right 05/19/2017   Procedure: INSERTION PORT-A-CATH WITH ULTRASOUND;  Surgeon: Rolm Bookbinder, MD;  Location: [facility];  Service: General;  Laterality: Right;    Wrist Fracture. Torn Rotator cuff and torn meniscus.    FAMILY HISTORY Family History  Problem Relation Age of Onset  . Heart disease Mother   . Cancer Father   . Diabetes Father   . Hypertension Father   . Hyperlipidemia Father   . Heart disease Brother   . Hypertension Brother   . Hyperlipidemia Brother   . Diabetes Brother   . Prostate cancer Brother 88  .  Breast cancer Sister 67  . Breast cancer Sister 63  The patient's father died at age 22 due to heart disease and emphysema. The patient's mother died at age 35 due to heart disease. The patient has 6 brothers and 8 sisters. She notes 2 sisters with breast cancer. The 1st  sister was diagnosed at age 68, and the 2nd sister was diagnosed at age 37. She notes that both sisters are alive. She also notes a paternal first cousin with breast cancer diagnosed in her 4's. The patient notes that her father also had bone cancer, but she's unsure if he had myeloma. She denies a family history of ovarian cancer.   GYNECOLOGIC HISTORY:  No LMP recorded. Patient is postmenopausal. 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 She is divorced. She lives by herself with no pets. The patient's daughter, [name], works in Therapist, art for [employer]. The patient's son, [name],  works for [employer]. The patient has  2 grandchildren. She belongs to [facility].     ADVANCED DIRECTIVES: Not in place.  At the 05/19/2017 visit the patient was given the appropriate documents to complete and notarized at her discretion   HEALTH MAINTENANCE: Social History   Tobacco Use  . Smoking status: Never Smoker  . Smokeless tobacco: Never Used  Substance Use Topics  . Alcohol use: No  . Drug use: No     Colonoscopy:   PAP: September 2018 normal  Bone density: none   Allergies  Allergen Reactions  . Latex Hives and Rash    Current Outpatient Medications  Medication Sig Dispense Refill  . acetaminophen (TYLENOL) 160 MG/5ML elixir Take 15 mg/kg by mouth every 4 (four) hours as needed for fever.    Marland Kitchen aspirin 81 MG tablet Take 81 mg by mouth daily.    . Camphor-Eucalyptus-Menthol (VICKS VAPORUB EX) Apply 1 application topically as needed (colds or bug bites).    Marland Kitchen dexamethasone (DECADRON) 4 MG tablet Take 2 tablets by mouth once a day on the day after chemotherapy and then take 2 tablets two times a day for 2 days. Take with food. 30 tablet 1  . diphenhydramine-acetaminophen (TYLENOL PM)  25-500 MG TABS tablet Take 1 tablet by mouth at bedtime as needed.    . famotidine (PEPCID) 20 MG tablet Take 1 tablet (20 mg total) by mouth 2 (two) times daily. 60 tablet 3  . fluconazole (DIFLUCAN) 200 MG tablet Take 1 tablet (200 mg total) by mouth daily. 3 tablet 5  . fluocinonide cream (LIDEX) 9.92 % Apply 1 application topically 2 (two) times daily as needed (rash).    . halobetasol (ULTRAVATE) 0.05 % cream Apply 1 application topically 2 (two) times daily as needed (rash).    Marland Kitchen lidocaine-prilocaine (EMLA) cream Apply to affected area once 30 g 3  . LORazepam (ATIVAN) 0.5 MG tablet Take 1 tablet (0.5 mg total) by mouth at bedtime as needed (Nausea or vomiting). 30 tablet 0  . magic mouthwash w/lidocaine SOLN Take 5 mLs by mouth 3 (three) times daily as needed for mouth pain. 300 mL 0  . meclizine (ANTIVERT) 12.5 MG tablet Take 6.25 mg by mouth 3 (three) times daily as needed for dizziness.    . naproxen sodium (ALEVE) 220 MG tablet Take 110-220 mg by mouth daily as  needed (pain). Takes 0.5-1 tablet as needed for pain    . ondansetron (ZOFRAN-ODT) 8 MG disintegrating tablet Take 8 mg by mouth every 8 (eight) hours as needed for nausea or vomiting.    Marland Kitchen OVER THE COUNTER MEDICATION Apply 1 application topically at bedtime. Pain Relief Balm    . oxyCODONE (OXY IR/ROXICODONE) 5 MG immediate release tablet Take 1 tablet (5 mg total) by mouth every 6 (six) hours as needed for moderate pain, severe pain or breakthrough pain. 10 tablet 0  . prochlorperazine (COMPAZINE) 10 MG tablet Take 1 tablet (10 mg total) by mouth every 6 (six) hours as needed (Nausea or vomiting). 30 tablet 1  . tiZANidine (ZANAFLEX) 4 MG tablet TAKE 0.5-1 TABLET AS NEEDED FOR MUSCLE SPASMS  1  . triamterene-hydrochlorothiazide (DYAZIDE) 37.5-25 MG capsule Take 1 capsule by mouth daily.  3  . vitamin B-12 (CYANOCOBALAMIN) 1000 MCG tablet Take 500 mcg by mouth daily. Takes 0.5 tablet    . neomycin-polymyxin-hydrocortisone  (CORTISPORIN) 3.5-10000-1 OTIC suspension   0   No current facility-administered medications for this visit.    Facility-Administered Medications Ordered in Other Visits  Medication Dose Route Frequency Provider Last Rate Last Dose  . cyclophosphamide (CYTOXAN) 1,180 mg in sodium chloride 0.9 % 250 mL chemo infusion  600 mg/m2 (Treatment Plan Recorded) Intravenous Once Magrinat, Virgie Dad, MD      . DOXOrubicin (ADRIAMYCIN) chemo injection 118 mg  60 mg/m2 (Treatment Plan Recorded) Intravenous Once Magrinat, Virgie Dad, MD   118 mg at 06/22/17 1216  . heparin lock flush 100 unit/mL  500 Units Intracatheter Once PRN Magrinat, Virgie Dad, MD      . sodium chloride flush (NS) 0.9 % injection 10 mL  10 mL Intracatheter PRN Magrinat, Virgie Dad, MD        OBJECTIVE: Vitals:   06/22/17 0900  BP: (!) 145/86  Pulse: (!) 102  Resp: 18  Temp: 98.5 F (36.9 C)  SpO2: 99%     There is no height or weight on file to calculate BMI.   Wt Readings from Last 3 Encounters:  06/14/17 186 lb 12.8 oz (84.7 kg)  06/07/17 189 lb 8 oz (86 kg)  05/20/17 189 lb 12.8 oz (86.1 kg)   ECOG FS:1 GENERAL: Patient is a well appearing female in no acute distress HEENT:  Sclerae anicteric.  Oropharynx moist, no ulceration, or oropharyngeal candidiasis noted. Neck is supple. TM are normal bilaterally, however ear canals appear irritated, the left canal has a small crack noted  NODES:  No cervical, supraclavicular, or axillary lymphadenopathy palpated.  BREAST EXAM:  Deferred. LUNGS:  Clear to auscultation bilaterally.  No wheezes or rhonchi. HEART:  Regular rate and rhythm. No murmur appreciated. ABDOMEN:  Soft, nontender.  Positive, normoactive bowel sounds. No organomegaly palpated. MSK:  No focal spinal tenderness to palpation. Full range of motion bilaterally in the upper extremities. EXTREMITIES:  No peripheral edema.   SKIN:  Clear with no obvious rashes or skin changes. No nail dyscrasia. NEURO:  Nonfocal. Well  oriented.  Appropriate affect.    LAB RESULTS:  CMP     Component Value Date/Time   NA 141 06/22/2017 1012   K 3.7 06/22/2017 1012   CL 105 06/22/2017 1012   CO2 27 06/22/2017 1012   GLUCOSE 93 06/22/2017 1012   BUN 18 06/22/2017 1012   CREATININE 0.99 06/22/2017 1012   CALCIUM 9.3 06/22/2017 1012   PROT 6.7 06/22/2017 1012   ALBUMIN 3.5 06/22/2017 1012  AST 16 06/22/2017 1012   ALT 9 06/22/2017 1012   ALKPHOS 88 06/22/2017 1012   BILITOT <0.2 (L) 06/22/2017 1012   GFRNONAA 59 (L) 06/22/2017 1012   GFRAA >60 06/22/2017 1012    No results found for: TOTALPROTELP, ALBUMINELP, A1GS, A2GS, BETS, BETA2SER, GAMS, MSPIKE, SPEI  No results found for: KPAFRELGTCHN, LAMBDASER, KAPLAMBRATIO  Lab Results  Component Value Date   WBC 12.1 (H) 06/22/2017   NEUTROABS 9.2 (H) 06/22/2017   HGB 11.5 (L) 08/12/2012   HCT 28.4 (L) 06/22/2017   MCV 76.5 (L) 06/22/2017   PLT 192 06/22/2017    _0 @  No results found for: LABCA2  No components found for: OZDGUY403  No results for input(s): INR in the last 168 hours.  No results found for: LABCA2  No results found for: KVQ259  No results found for: DGL875  No results found for: IEP329  No results found for: CA2729  No components found for: HGQUANT  No results found for: CEA1 / No results found for: CEA1   No results found for: AFPTUMOR  No results found for: CHROMOGRNA  No results found for: PSA1  Appointment on 06/22/2017  Component Date Value Ref Range Status  . Sodium 06/22/2017 141  136 - 145 mmol/L Final  . Potassium 06/22/2017 3.7  3.5 - 5.1 mmol/L Final  . Chloride 06/22/2017 105  98 - 109 mmol/L Final  . CO2 06/22/2017 27  22 - 29 mmol/L Final  . Glucose, Bld 06/22/2017 93  70 - 140 mg/dL Final  . BUN 06/22/2017 18  7 - 26 mg/dL Final  . Creatinine 06/22/2017 0.99  0.60 - 1.10 mg/dL Final  . Calcium 06/22/2017 9.3  8.4 - 10.4 mg/dL Final  . Total Protein 06/22/2017 6.7  6.4 - 8.3 g/dL Final  .  Albumin 06/22/2017 3.5  3.5 - 5.0 g/dL Final  . AST 06/22/2017 16  5 - 34 U/L Final  . ALT 06/22/2017 9  0 - 55 U/L Final  . Alkaline Phosphatase 06/22/2017 88  40 - 150 U/L Final  . Total Bilirubin 06/22/2017 <0.2* 0.2 - 1.2 mg/dL Final  . GFR, Est Non Af Am 06/22/2017 59* >60 mL/min Final  . GFR, Est AFR Am 06/22/2017 >60  >60 mL/min Final   Comment: (NOTE) The eGFR has been calculated using the CKD EPI equation. This calculation has not been validated in all clinical situations. eGFR's persistently <60 mL/min signify possible Chronic Kidney Disease.   [name] Hahn gap 06/22/2017 9  3 - 11 Final   Performed at [facility], [place] [address] [place], [place] [address]  . WBC Count 06/22/2017 12.1* 3.9 - 10.3 K/uL Final  . RBC 06/22/2017 3.71  3.70 - 5.45 MIL/uL Final  . Hemoglobin 06/22/2017 9.1* 11.6 - 15.9 g/dL Final  . HCT 06/22/2017 28.4* 34.8 - 46.6 % Final  . MCV 06/22/2017 76.5* 79.5 - 101.0 fL Final  . MCH 06/22/2017 24.5* 25.1 - 34.0 pg Final  . MCHC 06/22/2017 32.0  31.5 - 36.0 g/dL Final  . RDW 06/22/2017 14.5  11.2 - 14.5 % Final  . Platelet Count 06/22/2017 192  145 - 400 K/uL Final  . Neutrophils Relative % 06/22/2017 76  % Final  . Neutro Abs 06/22/2017 9.2* 1.5 - 6.5 K/uL Final  . Lymphocytes Relative 06/22/2017 8  % Final  . Lymphs Abs 06/22/2017 1.0  0.9 - 3.3 K/uL Final  . Monocytes Relative 06/22/2017 16  % Final  . Monocytes  Absolute 06/22/2017 1.9* 0.1 - 0.9 K/uL Final  . Eosinophils Relative 06/22/2017 0  % Final  . Eosinophils Absolute 06/22/2017 0.0  0.0 - 0.5 K/uL Final  . Basophils Relative 06/22/2017 0  % Final  . Basophils Absolute 06/22/2017 0.0  0.0 - 0.1 K/uL Final   Performed at [facility], [place] [address], [place], [place] [address]    (this displays the last labs from the last 3 days)  No results found for: TOTALPROTELP, ALBUMINELP, A1GS, A2GS, BETS, BETA2SER, GAMS, MSPIKE, SPEI (this displays SPEP  labs)  No results found for: KPAFRELGTCHN, LAMBDASER, KAPLAMBRATIO (kappa/lambda light chains)  No results found for: HGBA, HGBA2QUANT, HGBFQUANT, HGBSQUAN (Hemoglobinopathy evaluation)   No results found for: LDH  No results found for: IRON, TIBC, IRONPCTSAT (Iron and TIBC)  No results found for: FERRITIN  Urinalysis No results found for: COLORURINE, APPEARANCEUR, LABSPEC, PHURINE, GLUCOSEU, HGBUR, BILIRUBINUR, KETONESUR, PROTEINUR, UROBILINOGEN, NITRITE, LEUKOCYTESUR   STUDIES: No results found.  ELIGIBLE FOR AVAILABLE RESEARCH PROTOCOL: [facility] [phone]; not a candidate for breast MRI study given history of claustrophobia  ASSESSMENT: 62 y.o. [place] Woman status post right breast upper outer quadrant biopsy 05/02/2017 for a clinical T2 N1-2, stage IIIB invasive ductal carcinoma, grade 2-3, triple negative, with an MIB-180%  (a) breast MRI 05/18/2017 shows T3 N1 disease with possible involvement of the internal mammary nodes  (1) neoadjuvant chemotherapy to consist of doxorubicin and cyclophosphamide in dose dense fashion x4 starting 05/24/2017 to be  followed by carboplatin and paclitaxel weekly x12  (2) definitive surgery to follow  (3) consider SWOG [id number] depending on final pathology results  (4) adjuvant radiation to follow  (5) genetics testing pending  PLAN:  Emily Perez appears to be doing well today.  She came to see me first today, so I do not have any labs to review.  She will proceed with chemotherapy today so long as her labs are within parameters.  She is also meeting with our genetics counselor today. We briefly discussed her next chemotherapy regimen, Paclitaxel and Carbo and common side effects.    Chanise will return in one week for labs and f/u.  I went ahead and requested scheduling her her next chemotherapy regimen.    She knows to call for any problems that may develop before her next visit with [place].  A total of (20) minutes of face-to-face time was  spent with this patient with greater than 50% of that time in counseling and care-coordination.  Wilber Bihari, NP  06/22/17 12:24 PM Medical Oncology and Hematology [facility] [address] [place], [place] [address] Tel. [phone]    Fax. [phone]

## 2017-06-22 NOTE — Patient Instructions (Signed)
[facility] Discharge Instructions for Patients Receiving Chemotherapy  Today you received the following chemotherapy agents Adriamycin and Cytoxan  To help prevent nausea and vomiting after your treatment, we encourage you to take your nausea medication as directed.  If you develop nausea and vomiting that is not controlled by your nausea medication, call the clinic.   BELOW ARE SYMPTOMS THAT SHOULD BE REPORTED IMMEDIATELY:  *FEVER GREATER THAN 100.5 F  *CHILLS WITH OR WITHOUT FEVER  NAUSEA AND VOMITING THAT IS NOT CONTROLLED WITH YOUR NAUSEA MEDICATION  *UNUSUAL SHORTNESS OF BREATH  *UNUSUAL BRUISING OR BLEEDING  TENDERNESS IN MOUTH AND THROAT WITH OR WITHOUT PRESENCE OF ULCERS  *URINARY PROBLEMS  *BOWEL PROBLEMS  UNUSUAL RASH Items with * indicate a potential emergency and should be followed up as soon as possible.  Feel free to call the clinic should you have any questions or concerns. The clinic phone number is [phone].  Please show the CHEMO ALERT CARD at check-in to the Emergency Department and triage nurse.   

## 2017-06-22 NOTE — Progress Notes (Signed)
REFERRING PROVIDER: Chauncey Cruel, MD [address] [place], [place] [address]  PRIMARY PROVIDER:  Shirline Frees, MD  PRIMARY REASON FOR VISIT:  1. Malignant neoplasm of lower-outer quadrant of right breast of female, estrogen receptor negative ([place])   2. Family history of breast cancer   3. Family history of prostate cancer      HISTORY OF PRESENT ILLNESS:   Emily Perez, a 62 y.o. female, was seen for a [place] cancer genetics consultation at the request of Dr. Jana Hakim due to a personal and family history of cancer.  Emily Perez presents to clinic today to discuss the possibility of a hereditary predisposition to cancer, genetic testing, and to further clarify her future cancer risks, as well as potential cancer risks for family members.   In 2019, at the age of 73, Emily Perez was diagnosed with invasive ductal carcinoma of the right breast. This will be treated with chemotherapy, surgery and radiation.  The tumor is triple negative. She was seen today with her daughter, Emily Perez.      CANCER HISTORY:   No history exists.     HORMONAL RISK FACTORS:  Menarche was at age 1.  First live birth at age 67.  OCP use for approximately 3-4 years.  Ovaries intact: yes.  Hysterectomy: no.  Menopausal status: postmenopausal.  HRT use: 0 years. Colonoscopy: yes; some polyps were found. Mammogram within the last year: yes. Number of breast biopsies: 1. Up to date with pelvic exams:  yes. Any excessive radiation exposure in the past:  no  Past Medical History:  Diagnosis Date  . Arthritis   . Family history of breast cancer   . Family history of prostate cancer   . Headache    migraines in the past  . Hypertension   . PONV (postoperative nausea and vomiting)    after 1st colonoscopy    Past Surgical History:  Procedure Laterality Date  . COLONOSCOPY    . FRACTURE SURGERY Left    wrist  . PORTACATH PLACEMENT Right 05/19/2017   Procedure: INSERTION  PORT-A-CATH WITH ULTRASOUND;  Surgeon: Rolm Bookbinder, MD;  Location: [facility];  Service: General;  Laterality: Right;    Social History   Socioeconomic History  . Marital status: Legally Separated    Spouse name: Not on file  . Number of children: Not on file  . Years of education: Not on file  . Highest education level: Not on file  Occupational History  . Not on file  Social Needs  . Financial resource strain: Not on file  . Food insecurity:    Worry: Not on file    Inability: Not on file  . Transportation needs:    Medical: Not on file    Non-medical: Not on file  Tobacco Use  . Smoking status: Never Smoker  . Smokeless tobacco: Never Used  Substance and Sexual Activity  . Alcohol use: No  . Drug use: No  . Sexual activity: Not on file  Lifestyle  . Physical activity:    Days per week: Not on file    Minutes per session: Not on file  . Stress: Not on file  Relationships  . Social connections:    Talks on phone: Not on file    Gets together: Not on file    Attends religious service: Not on file    Active member of club or organization: Not on file    Attends meetings of clubs or organizations: Not on file  Relationship status: Not on file  Other Topics Concern  . Not on file  Social History Narrative  . Not on file     FAMILY HISTORY:  We obtained a detailed, 4-generation family history.  Significant diagnoses are listed below: Family History  Problem Relation Age of Onset  . Heart disease Mother   . Cancer Father   . Diabetes Father   . Hypertension Father   . Hyperlipidemia Father   . Heart disease Brother   . Hypertension Brother   . Hyperlipidemia Brother   . Diabetes Brother   . Prostate cancer Brother 44  . Breast cancer Sister 97  . Breast cancer Sister 40    The patient has two children who are cancer free.  She has seven sisters and six brothers.  Two sisters were diagnosed with breast cancer, one at 34 and the other at 64.  One brother  had prostate cancer at 87.  The patient's parents are both deceased.  The patient's mother died from heart disease at 72.  She had 6-7 siblings who did not have cancer.  The maternal grandparents are both deceased.  The patient's father died of bone cancer.  It is unknown if this was a primary cancer or a metastasis.  He had three sisters and two brothers who were cancer free.  Both paternal grandparents are deceased.  Emily Perez is unaware of previous family history of genetic testing for hereditary cancer risks. Patient's maternal ancestors are of African American descent, and paternal ancestors are of African American descent. There is no reported Ashkenazi Jewish ancestry. There is no known consanguinity.  GENETIC COUNSELING ASSESSMENT: Emily Perez is a 62 y.o. female with a personal and family history of breast cancer which is somewhat suggestive of a hereditary cancer syndrome and predisposition to cancer. We, therefore, discussed and recommended the following at today's visit.   DISCUSSION: We discussed that about 5-10% of breast cancer is hereditary with most cases due to BRCA mutations.  Other genes associated with hereditary breast cancer syndromes include ATM, CHEK2 and PALB2.  We discussed that her family history is relatively reassuring in that the majority of individuals with cancer developed it at typical ages of onset.  We reviewed the characteristics, features and inheritance patterns of hereditary cancer syndromes. We also discussed genetic testing, including the appropriate family members to test, the process of testing, insurance coverage and turn-around-time for results. We discussed the implications of a negative, positive and/or variant of uncertain significant result. We recommended Ms. Sorey pursue genetic testing for the common hereditary gene panel. The Hereditary Gene Panel offered by [facility] includes sequencing and/or deletion duplication testing of the  following 47 genes: APC, ATM, AXIN2, BARD1, BMPR1A, BRCA1, BRCA2, BRIP1, CDH1, CDK4, CDKN2A (p14ARF), CDKN2A (p16INK4a), CHEK2, CTNNA1, DICER1, EPCAM (Deletion/duplication testing only), GREM1 (promoter region deletion/duplication testing only), KIT, MEN1, MLH1, MSH2, MSH3, MSH6, MUTYH, NBN, NF1, NHTL1, PALB2, PDGFRA, PMS2, POLD1, POLE, PTEN, RAD50, RAD51C, RAD51D, SDHB, SDHC, SDHD, SMAD4, SMARCA4. STK11, TP53, TSC1, TSC2, and VHL.  The following genes were evaluated for sequence changes only: SDHA and HOXB13 c.251G>A variant only.  Based on Ms. Venier's personal and family history of cancer, she meets medical criteria for genetic testing. Despite that she meets criteria, she may still have an out of pocket cost. We discussed that if her out of pocket cost for testing is over $100, the laboratory will call and confirm whether she wants to proceed with testing.  If the out of pocket  cost of testing is less than $100 she will be billed by the genetic testing laboratory.   PLAN: Despite our recommendation, Ms. Catano did not wish to pursue genetic testing at today's visit. She is very concerned about the cost of testing. We will put the request through the laboratory for a benefits investigation to learn more what her financial responsibility would be.  We understand this decision, and remain available to coordinate genetic testing at any time in the future. We; therefore, recommend Ms. Llorente continue to follow the cancer screening guidelines given by her primary healthcare provider.  Lastly, we encouraged Ms. Tang to remain in contact with cancer genetics annually so that we can continuously update the family history and inform her of any changes in cancer genetics and testing that may be of benefit for this family.   Ms.  Slight questions were answered to her satisfaction today. Our contact information was provided should additional questions or concerns arise. Thank you for the  referral and allowing [place] to share in the care of your patient.   Reo Portela P. Florene Glen, [place], [facility] Certified Genetic Counselor [email or web address] phone: [phone]  The patient was seen for a total of 45 minutes in face-to-face genetic counseling.  This patient was discussed with Drs. Magrinat, Lindi Adie and/or Burr Medico who agrees with the above.    _______________________________________________________________________ For Office Staff:  Number of people involved in session: 2 Was an Intern/ student involved with case: yes

## 2017-06-22 NOTE — Progress Notes (Signed)
Ok to proceed with treatment with CMP results from 06/14/2017 per Dr. Jana Hakim.

## 2017-06-23 ENCOUNTER — Inpatient Hospital Stay: Payer: BC Managed Care – PPO

## 2017-06-23 VITALS — BP 125/65 | HR 77 | Temp 98.6°F | Resp 18

## 2017-06-23 DIAGNOSIS — C50511 Malignant neoplasm of lower-outer quadrant of right female breast: Secondary | ICD-10-CM

## 2017-06-23 DIAGNOSIS — Z171 Estrogen receptor negative status [ER-]: Secondary | ICD-10-CM

## 2017-06-23 DIAGNOSIS — C50411 Malignant neoplasm of upper-outer quadrant of right female breast: Secondary | ICD-10-CM | POA: Diagnosis not present

## 2017-06-23 MED ORDER — PEGFILGRASTIM-CBQV 6 MG/0.6ML ~~LOC~~ SOSY
6.0000 mg | PREFILLED_SYRINGE | Freq: Once | SUBCUTANEOUS | Status: AC
  Administered 2017-06-23: 6 mg via SUBCUTANEOUS

## 2017-06-23 MED ORDER — PEGFILGRASTIM-CBQV 6 MG/0.6ML ~~LOC~~ SOSY
PREFILLED_SYRINGE | SUBCUTANEOUS | Status: AC
  Filled 2017-06-23: qty 0.6

## 2017-06-24 ENCOUNTER — Other Ambulatory Visit: Payer: Self-pay | Admitting: Oncology

## 2017-06-28 ENCOUNTER — Inpatient Hospital Stay: Payer: BC Managed Care – PPO

## 2017-06-28 ENCOUNTER — Inpatient Hospital Stay (HOSPITAL_BASED_OUTPATIENT_CLINIC_OR_DEPARTMENT_OTHER): Payer: BC Managed Care – PPO | Admitting: Adult Health

## 2017-06-28 ENCOUNTER — Encounter: Payer: Self-pay | Admitting: Adult Health

## 2017-06-28 ENCOUNTER — Other Ambulatory Visit: Payer: Self-pay | Admitting: Adult Health

## 2017-06-28 ENCOUNTER — Other Ambulatory Visit: Payer: Self-pay | Admitting: Oncology

## 2017-06-28 ENCOUNTER — Telehealth: Payer: Self-pay | Admitting: Adult Health

## 2017-06-28 VITALS — BP 117/74 | HR 98 | Temp 98.9°F | Resp 20 | Ht 63.5 in | Wt 184.8 lb

## 2017-06-28 DIAGNOSIS — Z8042 Family history of malignant neoplasm of prostate: Secondary | ICD-10-CM

## 2017-06-28 DIAGNOSIS — E860 Dehydration: Secondary | ICD-10-CM | POA: Diagnosis not present

## 2017-06-28 DIAGNOSIS — Z95828 Presence of other vascular implants and grafts: Secondary | ICD-10-CM

## 2017-06-28 DIAGNOSIS — Z79899 Other long term (current) drug therapy: Secondary | ICD-10-CM | POA: Diagnosis not present

## 2017-06-28 DIAGNOSIS — Z171 Estrogen receptor negative status [ER-]: Secondary | ICD-10-CM

## 2017-06-28 DIAGNOSIS — C50511 Malignant neoplasm of lower-outer quadrant of right female breast: Secondary | ICD-10-CM

## 2017-06-28 DIAGNOSIS — Z7982 Long term (current) use of aspirin: Secondary | ICD-10-CM

## 2017-06-28 DIAGNOSIS — C773 Secondary and unspecified malignant neoplasm of axilla and upper limb lymph nodes: Secondary | ICD-10-CM

## 2017-06-28 DIAGNOSIS — Z803 Family history of malignant neoplasm of breast: Secondary | ICD-10-CM | POA: Diagnosis not present

## 2017-06-28 DIAGNOSIS — C50411 Malignant neoplasm of upper-outer quadrant of right female breast: Secondary | ICD-10-CM

## 2017-06-28 LAB — CMP (CANCER CENTER ONLY)
ALT: 11 U/L (ref 0–55)
AST: 15 U/L (ref 5–34)
Albumin: 3.5 g/dL (ref 3.5–5.0)
Alkaline Phosphatase: 119 U/L (ref 40–150)
Anion gap: 10 (ref 3–11)
BUN: 25 mg/dL (ref 7–26)
CO2: 28 mmol/L (ref 22–29)
Calcium: 9.5 mg/dL (ref 8.4–10.4)
Chloride: 102 mmol/L (ref 98–109)
Creatinine: 1.2 mg/dL — ABNORMAL HIGH (ref 0.60–1.10)
GFR, Est AFR Am: 55 mL/min — ABNORMAL LOW (ref >60)
GFR, Estimated: 47 mL/min — ABNORMAL LOW (ref >60)
Glucose, Bld: 92 mg/dL (ref 70–140)
Potassium: 4 mmol/L (ref 3.5–5.1)
Sodium: 140 mmol/L (ref 136–145)
Total Bilirubin: 0.4 mg/dL (ref 0.2–1.2)
Total Protein: 6.9 g/dL (ref 6.4–8.3)

## 2017-06-28 LAB — CBC WITH DIFFERENTIAL (CANCER CENTER ONLY)
Basophils Absolute: 0 10*3/uL (ref 0.0–0.1)
Basophils Relative: 1 %
Eosinophils Absolute: 0 10*3/uL (ref 0.0–0.5)
Eosinophils Relative: 1 %
HCT: 28.2 % — ABNORMAL LOW (ref 34.8–46.6)
Hemoglobin: 9 g/dL — ABNORMAL LOW (ref 11.6–15.9)
Lymphocytes Relative: 8 %
Lymphs Abs: 0.3 10*3/uL — ABNORMAL LOW (ref 0.9–3.3)
MCH: 24.1 pg — ABNORMAL LOW (ref 25.1–34.0)
MCHC: 32 g/dL (ref 31.5–36.0)
MCV: 75.4 fL — ABNORMAL LOW (ref 79.5–101.0)
Monocytes Absolute: 0.1 10*3/uL (ref 0.1–0.9)
Monocytes Relative: 3 %
Neutro Abs: 3.8 10*3/uL (ref 1.5–6.5)
Neutrophils Relative %: 87 %
Platelet Count: 200 10*3/uL (ref 145–400)
RBC: 3.74 MIL/uL (ref 3.70–5.45)
RDW: 14.1 % (ref 11.2–14.5)
WBC Count: 4.3 10*3/uL (ref 3.9–10.3)

## 2017-06-28 MED ORDER — SODIUM CHLORIDE 0.9 % IV SOLN: INTRAVENOUS | Status: DC

## 2017-06-28 MED ORDER — SODIUM CHLORIDE 0.9% FLUSH
10.0000 mL | INTRAVENOUS | Status: DC | PRN
  Administered 2017-06-28: 10 mL via INTRAVENOUS
  Filled 2017-06-28: qty 10

## 2017-06-28 NOTE — Progress Notes (Addendum)
[facility]  Telephone:([phone]) [phone] Fax:([phone]     ID: Emily Perez DOB: Feb 08, 1956  MR#: [id number]  KGU#:[id number]  Patient Care Team: Shirline Frees, MD as PCP - General (Family Medicine) Magrinat, Virgie Dad, MD as Consulting Physician (Oncology) Rolm Bookbinder, MD as Consulting Physician (General Surgery) Gery Pray, MD as Consulting Physician (Radiation Oncology) Allyn Kenner, MD (Dermatology) Thurnell Lose, MD as Consulting Physician (Obstetrics and Gynecology) Marchia Bond, MD as Consulting Physician (Orthopedic Surgery) Christene Slates, MD as Physician Assistant (Radiology) OTHER MD:  CHIEF COMPLAINT: Triple negative breast cancer  CURRENT TREATMENT: Neoadjuvant chemotherapy   INTERVAL HISTORY: Emily Perez returns today for follow up and treatment of her triple negative breast cancer accompanied by her daughter.  On 05/25/2017, she started neoadjuvant chemotherapy with Doxorubicin and Cyclophosphamide dose dense--even every 14 days x4, to be followed by weekly paclitaxel x12.  Today is cycle 3 day 8 of Doxorubicin and Cyclophosphamide.    REVIEW OF SYSTEMS: [name] is not feeling very well today.  She has had difficulty with drinking fluids due to taste aversions.  She has also noted a decreased appetite.  Her weight is down 2 pounds in the past two weeks.  She also notes that she had a very difficult time during chemotherapy last week.  She was in the G section, and noted that being around so many people and the noisiness was sensory overload.  She says she cried the entire time during treatment.  She would like to be somewhere that is more quiet with future treatments.    Otherwise, [name] is doing well and denies fevers, chills, nausea, vomiting, constipation, diarrhea, mucositis, or any other concerns.  A Detailed ROS was otherwise non contributory.     HISTORY OF CURRENT ILLNESS: Emily Perez had routine bilateral screening mammography at  [facility] on 04/19/2017 showing a possible abnormality in the right breast. She underwent unilateral right diagnostic mammography with tomography and right breast ultrasonography at [facility] on 04/25/2017 showing: breast density category B.  In the right breast at the 6:00 radiant 2 cm from the nipple there was a 2 cm oval mass which by ultrasound measured 2.5 cm and was irregular and hypoechoic.  There were also at least 3 abnormal appearing lymph nodes in the right axilla.  Accordingly on 05/02/2017 she proceeded to biopsy of the right breast mass in question and lymph node sampling. The pathology from this procedure showed ([id number]): Invasive ductal carcinoma grade II. One lymph node positive for metastatic carcinoma (1/1). Prognostic indicators significant for: estrogen receptor, 0% negative and progesterone receptor, 0% negative. Proliferation marker Ki67 at 80%. HER2 not amplified with ratios HER2/CEP17 signals 1.29 and average copies per cell 2.25  The patient's subsequent history is as detailed below.    PAST MEDICAL HISTORY: Past Medical History:  Diagnosis Date  . Arthritis   . Family history of breast cancer   . Family history of prostate cancer   . Headache    migraines in the past  . Hypertension   . PONV (postoperative nausea and vomiting)    after 1st colonoscopy  Migraines in the past.   PAST SURGICAL HISTORY: Past Surgical History:  Procedure Laterality Date  . COLONOSCOPY    . FRACTURE SURGERY Left    wrist  . PORTACATH PLACEMENT Right 05/19/2017   Procedure: INSERTION PORT-A-CATH WITH ULTRASOUND;  Surgeon: Rolm Bookbinder, MD;  Location: [place];  Service: General;  Laterality: Right;    Wrist Fracture. Torn Rotator cuff and torn meniscus.  FAMILY HISTORY Family History  Problem Relation Age of Onset  . Heart disease Mother   . Cancer Father   . Diabetes Father   . Hypertension Father   . Hyperlipidemia Father   . Heart disease Brother   . Hypertension  Brother   . Hyperlipidemia Brother   . Diabetes Brother   . Prostate cancer Brother 81  . Breast cancer Sister 87  . Breast cancer Sister 44  The patient's father died at age 51 due to heart disease and emphysema. The patient's mother died at age 97 due to heart disease. The patient has 6 brothers and 8 sisters. She notes 2 sisters with breast cancer. The 1st sister was diagnosed at age 58, and the 2nd sister was diagnosed at age 76. She notes that both sisters are alive. She also notes a paternal first cousin with breast cancer diagnosed in her 10's. The patient notes that her father also had bone cancer, but she's unsure if he had myeloma. She denies a family history of ovarian cancer.   GYNECOLOGIC HISTORY:  No LMP recorded. Patient is postmenopausal. 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 She lives by herself with no pets. The patient's daughter, Emily Perez, works in Therapist, art for [employer]. The patient's son, Emily Perez,  works for [employer]. The patient has  2 grandchildren. She belongs to [facility].     ADVANCED DIRECTIVES: Not in place.  At the 05/19/2017 visit the patient was given the appropriate documents to complete and notarized at her discretion   HEALTH MAINTENANCE: Social History   Tobacco Use  . Smoking status: Never Smoker  . Smokeless tobacco: Never Used  Substance Use Topics  . Alcohol use: No  . Drug use: No     Colonoscopy:   PAP: September 2018 normal  Bone density: none   Allergies  Allergen Reactions  . Latex Hives and Rash    Current Outpatient Medications  Medication Sig Dispense Refill  . famotidine (PEPCID) 20 MG tablet Take 1 tablet (20 mg total) by mouth 2 (two) times daily. 60 tablet 3  . magic mouthwash w/lidocaine SOLN Take 5  mLs by mouth 3 (three) times daily as needed for mouth pain. 300 mL 0  . triamterene-hydrochlorothiazide (DYAZIDE) 37.5-25 MG capsule Take 1 capsule by mouth daily.  3  . acetaminophen (TYLENOL) 160 MG/5ML elixir Take 15 mg/kg by mouth every 4 (four) hours as needed for fever.    Marland Kitchen aspirin 81 MG tablet Take 81 mg by mouth daily.    Marland Kitchen dexamethasone (DECADRON) 4 MG tablet Take 2 tablets by mouth once a day on the day after chemotherapy and then take 2 tablets two times a day for 2 days. Take with food. (Patient not taking: Reported on 06/28/2017) 30 tablet 1  . diphenhydramine-acetaminophen (TYLENOL PM) 25-500 MG TABS tablet Take 1 tablet by mouth at bedtime as needed.    . fluconazole (DIFLUCAN) 200 MG tablet Take 1 tablet (200 mg total) by mouth daily. (Patient not taking: Reported on 06/28/2017) 3 tablet 5  . fluocinonide cream (LIDEX) 9.38 % Apply 1 application topically 2 (two) times daily as needed (rash).    . halobetasol (ULTRAVATE) 0.05 % cream Apply 1 application topically 2 (two) times daily as needed (rash).    Marland Kitchen  lidocaine-prilocaine (EMLA) cream Apply to affected area once (Patient not taking: Reported on 06/28/2017) 30 g 3  . LORazepam (ATIVAN) 0.5 MG tablet Take 1 tablet (0.5 mg total) by mouth at bedtime as needed (Nausea or vomiting). (Patient not taking: Reported on 06/28/2017) 30 tablet 0  . meclizine (ANTIVERT) 12.5 MG tablet Take 6.25 mg by mouth 3 (three) times daily as needed for dizziness.    . naproxen sodium (ALEVE) 220 MG tablet Take 110-220 mg by mouth daily as needed (pain). Takes 0.5-1 tablet as needed for pain    . neomycin-polymyxin-hydrocortisone (CORTISPORIN) 3.5-10000-1 OTIC suspension   0  . ondansetron (ZOFRAN-ODT) 8 MG disintegrating tablet Take 8 mg by mouth every 8 (eight) hours as needed for nausea or vomiting.    Marland Kitchen OVER THE COUNTER MEDICATION Apply 1 application topically at bedtime. Pain Relief Balm    . prochlorperazine (COMPAZINE) 10 MG tablet Take 1 tablet (10 mg  total) by mouth every 6 (six) hours as needed (Nausea or vomiting). (Patient not taking: Reported on 06/28/2017) 30 tablet 1  . vitamin B-12 (CYANOCOBALAMIN) 1000 MCG tablet Take 500 mcg by mouth daily. Takes 0.5 tablet     No current facility-administered medications for this visit.     OBJECTIVE: Vitals:   06/28/17 1255  BP: 117/74  Pulse: 98  Resp: 20  Temp: 98.9 F (37.2 C)  SpO2: 99%     Body mass index is 32.22 kg/m.   Wt Readings from Last 3 Encounters:  06/28/17 184 lb 12.8 oz (83.8 kg)  06/14/17 186 lb 12.8 oz (84.7 kg)  06/07/17 189 lb 8 oz (86 kg)   ECOG FS:1 GENERAL: Patient is a well appearing female in no acute distress HEENT:  Sclerae anicteric.  Oropharynx moist, no ulceration, or oropharyngeal candidiasis noted. Neck is supple.  NODES:  No cervical, supraclavicular, or axillary lymphadenopathy palpated.  BREAST EXAM:  Deferred. LUNGS:  Clear to auscultation bilaterally.  No wheezes or rhonchi. HEART:  Regular rate and rhythm. No murmur appreciated. ABDOMEN:  Soft, nontender.  Positive, normoactive bowel sounds. No organomegaly palpated. MSK:  No focal spinal tenderness to palpation. Full range of motion bilaterally in the upper extremities. EXTREMITIES:  No peripheral edema.   SKIN:  Clear with no obvious rashes or skin changes. No nail dyscrasia. NEURO:  Nonfocal. Well oriented.  Appropriate affect.    LAB RESULTS:  CMP     Component Value Date/Time   NA 141 06/22/2017 1012   K 3.7 06/22/2017 1012   CL 105 06/22/2017 1012   CO2 27 06/22/2017 1012   GLUCOSE 93 06/22/2017 1012   BUN 18 06/22/2017 1012   CREATININE 0.99 06/22/2017 1012   CALCIUM 9.3 06/22/2017 1012   PROT 6.7 06/22/2017 1012   ALBUMIN 3.5 06/22/2017 1012   AST 16 06/22/2017 1012   ALT 9 06/22/2017 1012   ALKPHOS 88 06/22/2017 1012   BILITOT <0.2 (L) 06/22/2017 1012   GFRNONAA 59 (L) 06/22/2017 1012   GFRAA >60 06/22/2017 1012    No results found for: TOTALPROTELP, ALBUMINELP,  A1GS, A2GS, BETS, BETA2SER, GAMS, MSPIKE, SPEI  No results found for: KPAFRELGTCHN, LAMBDASER, KAPLAMBRATIO  Lab Results  Component Value Date   WBC 4.3 06/28/2017   NEUTROABS 3.8 06/28/2017   HGB 11.5 (L) 08/12/2012   HCT 28.2 (L) 06/28/2017   MCV 75.4 (L) 06/28/2017   PLT 200 06/28/2017    @LASTCHEMISTRY @  No results found for: LABCA2  No components found for: HUTMLY650  No results for  input(s): INR in the last 168 hours.  No results found for: LABCA2  No results found for: ZGY174  No results found for: BSW967  No results found for: RFF638  No results found for: CA2729  No components found for: HGQUANT  No results found for: CEA1 / No results found for: CEA1   No results found for: AFPTUMOR  No results found for: CHROMOGRNA  No results found for: PSA1  Appointment on 06/28/2017  Component Date Value Ref Range Status  . WBC Count 06/28/2017 4.3  3.9 - 10.3 K/uL Final  . RBC 06/28/2017 3.74  3.70 - 5.45 MIL/uL Final  . Hemoglobin 06/28/2017 9.0* 11.6 - 15.9 g/dL Final  . HCT 06/28/2017 28.2* 34.8 - 46.6 % Final  . MCV 06/28/2017 75.4* 79.5 - 101.0 fL Final  . MCH 06/28/2017 24.1* 25.1 - 34.0 pg Final  . MCHC 06/28/2017 32.0  31.5 - 36.0 g/dL Final  . RDW 06/28/2017 14.1  11.2 - 14.5 % Final  . Platelet Count 06/28/2017 200  145 - 400 K/uL Final  . Neutrophils Relative % 06/28/2017 87  % Final  . Neutro Abs 06/28/2017 3.8  1.5 - 6.5 K/uL Final  . Lymphocytes Relative 06/28/2017 8  % Final  . Lymphs Abs 06/28/2017 0.3* 0.9 - 3.3 K/uL Final  . Monocytes Relative 06/28/2017 3  % Final  . Monocytes Absolute 06/28/2017 0.1  0.1 - 0.9 K/uL Final  . Eosinophils Relative 06/28/2017 1  % Final  . Eosinophils Absolute 06/28/2017 0.0  0.0 - 0.5 K/uL Final  . Basophils Relative 06/28/2017 1  % Final  . Basophils Absolute 06/28/2017 0.0  0.0 - 0.1 K/uL Final   Performed at [facility], [place] [address] [place], [place] [address]    (this  displays the last labs from the last 3 days)  No results found for: TOTALPROTELP, ALBUMINELP, A1GS, A2GS, BETS, BETA2SER, GAMS, MSPIKE, SPEI (this displays SPEP labs)  No results found for: KPAFRELGTCHN, LAMBDASER, KAPLAMBRATIO (kappa/lambda light chains)  No results found for: HGBA, HGBA2QUANT, HGBFQUANT, HGBSQUAN (Hemoglobinopathy evaluation)   No results found for: LDH  No results found for: IRON, TIBC, IRONPCTSAT (Iron and TIBC)  No results found for: FERRITIN  Urinalysis No results found for: COLORURINE, APPEARANCEUR, LABSPEC, PHURINE, GLUCOSEU, HGBUR, BILIRUBINUR, KETONESUR, PROTEINUR, UROBILINOGEN, NITRITE, LEUKOCYTESUR   STUDIES: No results found.  ELIGIBLE FOR AVAILABLE RESEARCH PROTOCOL: [facility] [phone]; not a candidate for breast MRI study given history of claustrophobia  ASSESSMENT: 62 y.o. [place] Woman status post right breast upper outer quadrant biopsy 05/02/2017 for a clinical T2 N1-2, stage IIIB invasive ductal carcinoma, grade 2-3, triple negative, with an MIB-180%  (a) breast MRI 05/18/2017 shows T3 N1 disease with possible involvement of the internal mammary nodes  (1) neoadjuvant chemotherapy to consist of doxorubicin and cyclophosphamide in dose dense fashion x4 starting 05/24/2017 to be  followed by carboplatin and paclitaxel weekly x12  (2) definitive surgery to follow  (3) consider SWOG [id number] depending on final pathology results  (4) adjuvant radiation to follow  (5) genetics testing pending  PLAN:  Djuana is doing moderately well following chemotherapy. Her CBC is stable.  She is not neutropenic.  She is slightly dehydrated, her creatinine did elevate to 1.20.  Due to this, I recommended that she receive IV fluids today. After discussion of her needing the fluids, she agreed to receive them.  I reviewed the issue of the chemo room with Threasa Beards our Surveyor, quantity of nursing.  She  has worked out for [employer] to receive treatment in a bed instead  of in the semi private area.  She met with Dr. Jana Hakim today also.    [place] will return in one week for labs, f/u, and her fourth cycle of chemotherapy with Doxorubicin and Cyclophosphamide.   She knows to call for any problems that may develop before her next visit with [place].   Wilber Bihari, NP  06/28/17 1:14 PM Medical Oncology and Hematology [facility] [address] [place], [place] [address] Tel. [phone]    Fax. [phone]   ADDENDUM: [place] is having a hard time with her treatments.  Of course some people like to have a colonic even while they get treated but she felt very isolated and excluded in the "treatment circle" area.  She feels she will do considerably better in a more private setting and we are getting her a room for treatment with her final cycle of the current treatment.  I reassured her that after her last cycle of CA the upcoming Taxol [place] treatment will be a lot easier on her.  She is agreeable to continuing as described.  She knows to call for any other issues that may develop before the next visit.  I personally saw this patient and performed a substantive portion of this encounter with the listed APP documented above.   Chauncey Cruel, MD Medical Oncology and Hematology [facility] [address] [place], [place] [address] Tel. [phone]    Fax. [phone]

## 2017-06-28 NOTE — Patient Instructions (Signed)

## 2017-06-28 NOTE — Telephone Encounter (Signed)
Gave patient AVs and calendar of upcoming April appointments. Patient scheduled per 4/9 los.

## 2017-06-28 NOTE — Progress Notes (Unsigned)
I was called by Dr. Luan Pulling the mammographer regarding this patient who apparently fell out of follow-up about 3 years ago, refusing all treatment, and now returns with locally recurrent and likely metastatic disease.  She wanted [place] to make sure to see this patient this week.  I have alerted Dr. Burr Medico who will do everything she can to accommodate this patient assuming the patient is willing to be seen.

## 2017-07-04 ENCOUNTER — Other Ambulatory Visit: Payer: Self-pay | Admitting: *Deleted

## 2017-07-04 DIAGNOSIS — C50511 Malignant neoplasm of lower-outer quadrant of right female breast: Secondary | ICD-10-CM

## 2017-07-04 DIAGNOSIS — Z171 Estrogen receptor negative status [ER-]: Secondary | ICD-10-CM

## 2017-07-04 NOTE — Progress Notes (Signed)
[place]  [phone] Fax:(336) [phone]     ID: Emily Perez DOB: 1956/02/10  MR#: [id number]  CBJ#:[id number]  Patient Care Team: Shirline Frees, MD as PCP - General (Family Medicine) Kirat Mezquita, Virgie Dad, MD as Consulting Physician (Oncology) Rolm Bookbinder, MD as Consulting Physician (General Surgery) Gery Pray, MD as Consulting Physician (Radiation Oncology) Allyn Kenner, MD (Dermatology) Thurnell Lose, MD as Consulting Physician (Obstetrics and Gynecology) Marchia Bond, MD as Consulting Physician (Orthopedic Surgery) Christene Slates, MD as Physician Assistant (Radiology) OTHER MD:  CHIEF COMPLAINT: Triple negative breast cancer  CURRENT TREATMENT: Neoadjuvant chemotherapy   INTERVAL HISTORY: Emily Perez returns today for follow up and treatment of her triple negative breast cancer accompanied by her daughter. She continues on neoadjuvant doxorubicin and cyclophosphamide dose dense--even every 14 days x 4, to be followed by weekly paclitaxel x12. Today is day 1, cycle 4. She feels tired and weak. She has decreased appetite, and taste is bad. She adds lemon and vinaigrette spices to her food. She denies fever or oral sores. She has some constipation with fiber pills or prunes. After that, she has some diarrhea. Her port is working well.    REVIEW OF SYSTEMS: Emily Perez went to church and physical therapy over the weekend. She feels a little tired. She denies unusual headaches, visual changes, nausea, vomiting, or dizziness. There has been no unusual cough, phlegm production, or pleurisy. This been no change in bowel or bladder habits. She denies unexplained fatigue or unexplained weight loss, bleeding, rash, or fever. A detailed review of systems was otherwise stable.   HISTORY OF CURRENT ILLNESS: Emily Perez had routine bilateral screening mammography at [facility] on 04/19/2017 showing a possible abnormality in the right breast. She underwent  unilateral right diagnostic mammography with tomography and right breast ultrasonography at [facility] on 04/25/2017 showing: breast density category B.  In the right breast at the 6:00 radiant 2 cm from the nipple there was a 2 cm oval mass which by ultrasound measured 2.5 cm and was irregular and hypoechoic.  There were also at least 3 abnormal appearing lymph nodes in the right axilla.  Accordingly on 05/02/2017 she proceeded to biopsy of the right breast mass in question and lymph node sampling. The pathology from this procedure showed ([id number]): Invasive ductal carcinoma grade II. One lymph node positive for metastatic carcinoma (1/1). Prognostic indicators significant for: estrogen receptor, 0% negative and progesterone receptor, 0% negative. Proliferation marker Ki67 at 80%. HER2 not amplified with ratios HER2/CEP17 signals 1.29 and average copies per cell 2.25  The patient's subsequent history is as detailed below.    PAST MEDICAL HISTORY: Past Medical History:  Diagnosis Date  . Arthritis   . Family history of breast cancer   . Family history of prostate cancer   . Headache    migraines in the past  . Hypertension   . PONV (postoperative nausea and vomiting)    after 1st colonoscopy  Migraines in the past.   PAST SURGICAL HISTORY: Past Surgical History:  Procedure Laterality Date  . COLONOSCOPY    . FRACTURE SURGERY Left    wrist  . PORTACATH PLACEMENT Right 05/19/2017   Procedure: INSERTION PORT-A-CATH WITH ULTRASOUND;  Surgeon: Rolm Bookbinder, MD;  Location: [facility];  Service: General;  Laterality: Right;    Wrist Fracture. Torn Rotator cuff and torn meniscus.    FAMILY HISTORY Family History  Problem Relation Age of Onset  . Heart disease Mother   . Cancer Father   .  Diabetes Father   . Hypertension Father   . Hyperlipidemia Father   . Heart disease Brother   . Hypertension Brother   . Hyperlipidemia Brother   . Diabetes Brother   . Prostate cancer Brother 30    . Breast cancer Sister 85  . Breast cancer Sister 44  The patient's father died at age 17 due to heart disease and emphysema. The patient's mother died at age 81 due to heart disease. The patient has 6 brothers and 8 sisters. She notes 2 sisters with breast cancer. The 1st sister was diagnosed at age 20, and the 2nd sister was diagnosed at age 68. She notes that both sisters are alive. She also notes a paternal first cousin with breast cancer diagnosed in her 20's. The patient notes that her father also had bone cancer, but she's unsure if he had myeloma. She denies a family history of ovarian cancer.   GYNECOLOGIC HISTORY:  No LMP recorded. Patient is postmenopausal. 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 She is divorced. She lives by herself with no pets. The patient's daughter, [name], works in Therapist, art for [employer]. The patient's son, [name],  works for [employer]. The patient has  2 grandchildren. She belongs to [facility].      ADVANCED DIRECTIVES: Not in place.  At the 05/19/2017 visit the patient was given the appropriate documents to complete and notarized at her discretion   HEALTH MAINTENANCE: Social History   Tobacco Use  . Smoking status: Never Smoker  . Smokeless tobacco: Never Used  Substance Use Topics  . Alcohol use: No  . Drug use: No     Colonoscopy:   PAP: September 2018 normal  Bone density: none   Allergies  Allergen Reactions  . Latex Hives and Rash    Current Outpatient Medications  Medication Sig Dispense Refill  . acetaminophen (TYLENOL) 160 MG/5ML elixir Take 15 mg/kg by mouth every 4 (four) hours as needed for fever.    Marland Kitchen aspirin 81 MG tablet Take 81 mg by mouth daily.    . diphenhydramine-acetaminophen (TYLENOL PM) 25-500 MG TABS tablet Take  1 tablet by mouth at bedtime as needed.    . famotidine (PEPCID) 20 MG tablet Take 1 tablet (20 mg total) by mouth 2 (two) times daily. 60 tablet 3  . fluocinonide cream (LIDEX) 6.14 % Apply 1 application topically 2 (two) times daily as needed (rash).    . halobetasol (ULTRAVATE) 0.05 % cream Apply 1 application topically 2 (two) times daily as needed (rash).    . magic mouthwash w/lidocaine SOLN Take 5 mLs by mouth 3 (three) times daily as needed for mouth pain. 300 mL 0  . meclizine (ANTIVERT) 12.5 MG tablet Take 6.25 mg by mouth 3 (three) times daily as needed for dizziness.    . naproxen sodium (ALEVE) 220 MG tablet Take 110-220 mg by mouth daily as needed (pain). Takes 0.5-1 tablet as needed for pain    . neomycin-polymyxin-hydrocortisone (CORTISPORIN) 3.5-10000-1 OTIC suspension   0  . ondansetron (ZOFRAN-ODT) 8 MG disintegrating tablet Take 8 mg by mouth every 8 (eight) hours as needed for nausea or vomiting.    Marland Kitchen OVER THE COUNTER MEDICATION Apply 1 application topically at bedtime. Pain Relief Balm    . triamterene-hydrochlorothiazide (DYAZIDE) 37.5-25 MG  capsule Take 1 capsule by mouth daily.  3  . vitamin B-12 (CYANOCOBALAMIN) 1000 MCG tablet Take 500 mcg by mouth daily. Takes 0.5 tablet    . dexamethasone (DECADRON) 4 MG tablet Take 2 tablets by mouth once a day on the day after chemotherapy and then take 2 tablets two times a day for 2 days. Take with food. (Patient not taking: Reported on 06/28/2017) 30 tablet 1  . fluconazole (DIFLUCAN) 200 MG tablet Take 1 tablet (200 mg total) by mouth daily. (Patient not taking: Reported on 06/28/2017) 3 tablet 5  . lidocaine-prilocaine (EMLA) cream Apply to affected area once (Patient not taking: Reported on 06/28/2017) 30 g 3  . LORazepam (ATIVAN) 0.5 MG tablet Take 1 tablet (0.5 mg total) by mouth at bedtime as needed (Nausea or vomiting). (Patient not taking: Reported on 06/28/2017) 30 tablet 0  . prochlorperazine (COMPAZINE) 10 MG tablet Take 1 tablet  (10 mg total) by mouth every 6 (six) hours as needed (Nausea or vomiting). (Patient not taking: Reported on 06/28/2017) 30 tablet 1   No current facility-administered medications for this visit.     OBJECTIVE: Middle-aged African-American woman in no acute distress  Vitals:   07/05/17 0920  BP: 134/66  Pulse: 88  Resp: 18  Temp: 98.1 F (36.7 C)  SpO2: 100%     Body mass index is 32.92 kg/m.   Wt Readings from Last 3 Encounters:  07/05/17 188 lb 12.8 oz (85.6 kg)  06/28/17 184 lb 12.8 oz (83.8 kg)  06/14/17 186 lb 12.8 oz (84.7 kg)      ECOG FS:2 - Symptomatic, <50% confined to bed  Sclerae unicteric, pupils round and equal Oropharynx clear and moist No cervical or supraclavicular adenopathy Lungs no rales or rhonchi Heart regular rate and rhythm Abd soft, nontender, positive bowel sounds MSK no focal spinal tenderness, no upper extremity lymphedema Neuro: nonfocal, well oriented, appropriate affect Breasts: I do not palpate a well-defined mass in the right breast.  The left breast is unremarkable.  Both axillae are benign.  LAB RESULTS:  CMP     Component Value Date/Time   NA 140 06/28/2017 1235   K 4.0 06/28/2017 1235   CL 102 06/28/2017 1235   CO2 28 06/28/2017 1235   GLUCOSE 92 06/28/2017 1235   BUN 25 06/28/2017 1235   CREATININE 1.20 (H) 06/28/2017 1235   CALCIUM 9.5 06/28/2017 1235   PROT 6.9 06/28/2017 1235   ALBUMIN 3.5 06/28/2017 1235   AST 15 06/28/2017 1235   ALT 11 06/28/2017 1235   ALKPHOS 119 06/28/2017 1235   BILITOT 0.4 06/28/2017 1235   GFRNONAA 47 (L) 06/28/2017 1235   GFRAA 55 (L) 06/28/2017 1235    No results found for: TOTALPROTELP, ALBUMINELP, A1GS, A2GS, BETS, BETA2SER, GAMS, MSPIKE, SPEI  No results found for: KPAFRELGTCHN, LAMBDASER, KAPLAMBRATIO  Lab Results  Component Value Date   WBC 7.3 07/05/2017   NEUTROABS 5.1 07/05/2017   HGB 11.5 (L) 08/12/2012   HCT 25.3 (L) 07/05/2017   MCV 75.9 (L) 07/05/2017   PLT 145 07/05/2017     @LASTCHEMISTRY @  No results found for: LABCA2  No components found for: TFTDDU202  No results for input(s): INR in the last 168 hours.  No results found for: LABCA2  No results found for: RKY706  No results found for: CBJ628  No results found for: BTD176  No results found for: CA2729  No components found for: HGQUANT  No results found for: CEA1 / No results found  for: CEA1   No results found for: AFPTUMOR  No results found for: [place]blen  No results found for: PSA1  Appointment on 07/05/2017  Component Date Value Ref Range Status  . WBC Count 07/05/2017 7.3  3.9 - 10.3 K/uL Final  . RBC 07/05/2017 3.33* 3.70 - 5.45 MIL/uL Final  . Hemoglobin 07/05/2017 8.3* 11.6 - 15.9 g/dL Final  . HCT 07/05/2017 25.3* 34.8 - 46.6 % Final  . MCV 07/05/2017 75.9* 79.5 - 101.0 fL Final  . MCH 07/05/2017 24.9* 25.1 - 34.0 pg Final  . MCHC 07/05/2017 32.7  31.5 - 36.0 g/dL Final  . RDW 07/05/2017 14.5  11.2 - 14.5 % Final  . Platelet Count 07/05/2017 145  145 - 400 K/uL Final  . Neutrophils Relative % 07/05/2017 70  % Final  . Neutro Abs 07/05/2017 5.1  1.5 - 6.5 K/uL Final  . Lymphocytes Relative 07/05/2017 9  % Final  . Lymphs Abs 07/05/2017 0.7* 0.9 - 3.3 K/uL Final  . Monocytes Relative 07/05/2017 20  % Final  . Monocytes Absolute 07/05/2017 1.5* 0.1 - 0.9 K/uL Final  . Eosinophils Relative 07/05/2017 0  % Final  . Eosinophils Absolute 07/05/2017 0.0  0.0 - 0.5 K/uL Final  . Basophils Relative 07/05/2017 1  % Final  . Basophils Absolute 07/05/2017 0.1  0.0 - 0.1 K/uL Final   Performed at [facility], [place] [address], [place], [place] [address]    (this displays the last labs from the last 3 days)  No results found for: TOTALPROTELP, ALBUMINELP, A1GS, A2GS, BETS, BETA2SER, GAMS, MSPIKE, SPEI (this displays SPEP labs)  No results found for: KPAFRELGTCHN, LAMBDASER, KAPLAMBRATIO (kappa/lambda light chains)  No results found for: HGBA,  HGBA2QUANT, HGBFQUANT, HGBSQUAN (Hemoglobinopathy evaluation)   No results found for: LDH  No results found for: IRON, TIBC, IRONPCTSAT (Iron and TIBC)  No results found for: FERRITIN  Urinalysis No results found for: COLORURINE, APPEARANCEUR, LABSPEC, PHURINE, GLUCOSEU, HGBUR, BILIRUBINUR, KETONESUR, PROTEINUR, UROBILINOGEN, NITRITE, LEUKOCYTESUR   STUDIES: No results found.  ELIGIBLE FOR AVAILABLE RESEARCH PROTOCOL: [facility] SW[facility] [phone]; not a candidate for breast MRI study given history of claustrophobia  ASSESSMENT: 62 y.o. [place] Woman status post right breast upper outer quadrant biopsy 05/02/2017 for a clinical T2 N1-2, stage IIIB invasive ductal carcinoma, grade 2-3, triple negative, with an MIB-180%  (a) breast MRI 05/18/2017 shows T3 N1 disease with possible involvement of the internal mammary nodes  (1) neoadjuvant chemotherapy to consist of doxorubicin and cyclophosphamide in dose dense fashion x4 starting 05/24/2017 to be  followed by carboplatin and paclitaxel weekly x12  (2) definitive surgery to follow  (3) consider SWOG [id number] depending on final pathology results  (4) adjuvant radiation to follow  (5) genetics testing 06/22/2017, results pending  PLAN: Emily Perez will complete the first part of her neoadjuvant chemotherapy today.  She has done remarkably well with her treatments.  The only problem she is having is progressive anemia.  I do think her hemoglobin is going to be below 8 later this week and I have set her up for transfusion on 07/08/2017.  She will then start her carboplatin and paclitaxel weekly on 07/19/2017.  We discussed that in a preliminary fashion today.  She understands her nausea medicine will be curtailed, because the nausea problem is much less, and she will not need the Neulasta after treatments.  On the other hand we are concerned about the possibility of peripheral neuropathy and we discussed that at length today  If she decides she does  not want a transfusion she will let us know.  Otherwise we will see her again next week.  She knows to call for any other issues that may develop before the next visit.      Zavior Thomason, Virgie Dad, MD  07/05/17 9:32 AM Medical Oncology and Hematology [facility] [address] [place], [place] [address] Tel. [phone]    Fax. [phone]  This document serves as a record of services personally performed by Lurline Del, MD. It was created on his behalf by Sheron Nightingale, a trained medical scribe. The creation of this record is based on the scribe's personal observations and the provider's statements to them.   I have reviewed the above documentation for accuracy and completeness, and I agree with the above.

## 2017-07-05 ENCOUNTER — Other Ambulatory Visit: Payer: BC Managed Care – PPO

## 2017-07-05 ENCOUNTER — Telehealth: Payer: Self-pay | Admitting: Oncology

## 2017-07-05 ENCOUNTER — Inpatient Hospital Stay (HOSPITAL_BASED_OUTPATIENT_CLINIC_OR_DEPARTMENT_OTHER): Payer: BC Managed Care – PPO | Admitting: Oncology

## 2017-07-05 ENCOUNTER — Inpatient Hospital Stay: Payer: BC Managed Care – PPO

## 2017-07-05 ENCOUNTER — Telehealth: Payer: Self-pay

## 2017-07-05 ENCOUNTER — Other Ambulatory Visit: Payer: Self-pay

## 2017-07-05 ENCOUNTER — Telehealth: Payer: Self-pay | Admitting: *Deleted

## 2017-07-05 ENCOUNTER — Ambulatory Visit: Payer: BC Managed Care – PPO

## 2017-07-05 VITALS — BP 134/66 | HR 88 | Temp 98.1°F | Resp 18 | Ht 63.5 in | Wt 188.8 lb

## 2017-07-05 DIAGNOSIS — Z803 Family history of malignant neoplasm of breast: Secondary | ICD-10-CM | POA: Diagnosis not present

## 2017-07-05 DIAGNOSIS — Z8042 Family history of malignant neoplasm of prostate: Secondary | ICD-10-CM

## 2017-07-05 DIAGNOSIS — D649 Anemia, unspecified: Secondary | ICD-10-CM | POA: Diagnosis not present

## 2017-07-05 DIAGNOSIS — Z79899 Other long term (current) drug therapy: Secondary | ICD-10-CM | POA: Diagnosis not present

## 2017-07-05 DIAGNOSIS — C50511 Malignant neoplasm of lower-outer quadrant of right female breast: Secondary | ICD-10-CM

## 2017-07-05 DIAGNOSIS — Z7982 Long term (current) use of aspirin: Secondary | ICD-10-CM

## 2017-07-05 DIAGNOSIS — Z171 Estrogen receptor negative status [ER-]: Secondary | ICD-10-CM

## 2017-07-05 DIAGNOSIS — C50411 Malignant neoplasm of upper-outer quadrant of right female breast: Secondary | ICD-10-CM | POA: Diagnosis not present

## 2017-07-05 LAB — CMP (CANCER CENTER ONLY)
ALT: 7 U/L (ref 0–55)
AST: 13 U/L (ref 5–34)
Albumin: 3.5 g/dL (ref 3.5–5.0)
Alkaline Phosphatase: 90 U/L (ref 40–150)
Anion gap: 9 (ref 3–11)
BUN: 11 mg/dL (ref 7–26)
CO2: 26 mmol/L (ref 22–29)
Calcium: 9.4 mg/dL (ref 8.4–10.4)
Chloride: 107 mmol/L (ref 98–109)
Creatinine: 1.08 mg/dL (ref 0.60–1.10)
GFR, Est AFR Am: >60 mL/min (ref >60)
GFR, Estimated: 53 mL/min — ABNORMAL LOW (ref >60)
Glucose, Bld: 99 mg/dL (ref 70–140)
Potassium: 3.6 mmol/L (ref 3.5–5.1)
Sodium: 142 mmol/L (ref 136–145)
Total Bilirubin: <0.2 mg/dL — ABNORMAL LOW (ref 0.2–1.2)
Total Protein: 6.8 g/dL (ref 6.4–8.3)

## 2017-07-05 LAB — CBC WITH DIFFERENTIAL (CANCER CENTER ONLY)
Basophils Absolute: 0.1 10*3/uL (ref 0.0–0.1)
Basophils Relative: 1 %
Eosinophils Absolute: 0 10*3/uL (ref 0.0–0.5)
Eosinophils Relative: 0 %
HCT: 25.3 % — ABNORMAL LOW (ref 34.8–46.6)
Hemoglobin: 8.3 g/dL — ABNORMAL LOW (ref 11.6–15.9)
Lymphocytes Relative: 9 %
Lymphs Abs: 0.7 10*3/uL — ABNORMAL LOW (ref 0.9–3.3)
MCH: 24.9 pg — ABNORMAL LOW (ref 25.1–34.0)
MCHC: 32.7 g/dL (ref 31.5–36.0)
MCV: 75.9 fL — ABNORMAL LOW (ref 79.5–101.0)
Monocytes Absolute: 1.5 10*3/uL — ABNORMAL HIGH (ref 0.1–0.9)
Monocytes Relative: 20 %
Neutro Abs: 5.1 10*3/uL (ref 1.5–6.5)
Neutrophils Relative %: 70 %
Platelet Count: 145 10*3/uL (ref 145–400)
RBC: 3.33 MIL/uL — ABNORMAL LOW (ref 3.70–5.45)
RDW: 14.5 % (ref 11.2–14.5)
WBC Count: 7.3 10*3/uL (ref 3.9–10.3)

## 2017-07-05 LAB — PREPARE RBC (CROSSMATCH)

## 2017-07-05 LAB — ABO/RH: ABO/RH(D): B POS

## 2017-07-05 MED ORDER — SODIUM CHLORIDE 0.9 % IV SOLN
Freq: Once | INTRAVENOUS | Status: AC
  Administered 2017-07-05: 10:00:00 via INTRAVENOUS

## 2017-07-05 MED ORDER — SODIUM CHLORIDE 0.9% FLUSH
10.0000 mL | INTRAVENOUS | Status: DC | PRN
  Administered 2017-07-05: 10 mL
  Filled 2017-07-05: qty 10

## 2017-07-05 MED ORDER — HEPARIN SOD (PORK) LOCK FLUSH 100 UNIT/ML IV SOLN
500.0000 [IU] | Freq: Once | INTRAVENOUS | Status: AC | PRN
  Administered 2017-07-05: 500 [IU]
  Filled 2017-07-05: qty 5

## 2017-07-05 MED ORDER — PALONOSETRON HCL INJECTION 0.25 MG/5ML
0.2500 mg | Freq: Once | INTRAVENOUS | Status: AC
  Administered 2017-07-05: 0.25 mg via INTRAVENOUS

## 2017-07-05 MED ORDER — DOXORUBICIN HCL CHEMO IV INJECTION 2 MG/ML
60.0000 mg/m2 | Freq: Once | INTRAVENOUS | Status: AC
  Administered 2017-07-05: 118 mg via INTRAVENOUS
  Filled 2017-07-05: qty 59

## 2017-07-05 MED ORDER — SODIUM CHLORIDE 0.9 % IV SOLN
600.0000 mg/m2 | Freq: Once | INTRAVENOUS | Status: AC
  Administered 2017-07-05: 1180 mg via INTRAVENOUS
  Filled 2017-07-05: qty 59

## 2017-07-05 MED ORDER — SODIUM CHLORIDE 0.9 % IV SOLN
Freq: Once | INTRAVENOUS | Status: AC
  Administered 2017-07-05: 11:00:00 via INTRAVENOUS
  Filled 2017-07-05: qty 5

## 2017-07-05 MED ORDER — PALONOSETRON HCL INJECTION 0.25 MG/5ML
INTRAVENOUS | Status: AC
  Filled 2017-07-05: qty 5

## 2017-07-05 NOTE — Telephone Encounter (Signed)
"  I was seen today, discussed blood transfusions, wondering if Dr. Jana Hakim would consider use of iron.  No religious restrictions just my own personal feelings against blood products is why I ask.  I have copies of today's lab results.  I'm not experiencing any shortness of breath or chest pain."        Caller transferred to collaborative.

## 2017-07-05 NOTE — Progress Notes (Signed)
Orders placed per Dr Jana Hakim for blood transfusion for 2 units PRBCs for 07-08-2017 with type and screen.  Notified Loren in infusion and told her to let pt know not to remove blood bracelet because will be used on Friday.

## 2017-07-05 NOTE — Telephone Encounter (Signed)
Gave calendar  °

## 2017-07-05 NOTE — Addendum Note (Signed)
Addended by: Phineas Inches on: 07/05/2017 09:55 AM   Modules accepted: Orders, SmartSet

## 2017-07-05 NOTE — Patient Instructions (Signed)
[facility] Discharge Instructions for Patients Receiving Chemotherapy  Today you received the following chemotherapy agents Adriamycin, Cytoxan.  To help prevent nausea and vomiting after your treatment, we encourage you to take your nausea medication as prescribed.   If you develop nausea and vomiting that is not controlled by your nausea medication, call the clinic.   BELOW ARE SYMPTOMS THAT SHOULD BE REPORTED IMMEDIATELY:  *FEVER GREATER THAN 100.5 F  *CHILLS WITH OR WITHOUT FEVER  NAUSEA AND VOMITING THAT IS NOT CONTROLLED WITH YOUR NAUSEA MEDICATION  *UNUSUAL SHORTNESS OF BREATH  *UNUSUAL BRUISING OR BLEEDING  TENDERNESS IN MOUTH AND THROAT WITH OR WITHOUT PRESENCE OF ULCERS  *URINARY PROBLEMS  *BOWEL PROBLEMS  UNUSUAL RASH Items with * indicate a potential emergency and should be followed up as soon as possible.  Feel free to call the clinic should you have any questions or concerns. The clinic phone number is [phone].  Please show the CHEMO ALERT CARD at check-in to the Emergency Department and triage nurse.   

## 2017-07-05 NOTE — Telephone Encounter (Signed)
Received VM from pt in regards to:  She would like to know if she should take iron in the mean time?  Are there any options other than receiving a blood transfusion for helping increase my iron?  As she indicated that she really didn't want a blood transfusion for her own reasons.    I called the patient and let her know that I will let Dr Jana Hakim address these questions and the nurse will return her call tomorrow.  Pt voiced understanding.

## 2017-07-06 ENCOUNTER — Other Ambulatory Visit: Payer: Self-pay | Admitting: *Deleted

## 2017-07-06 ENCOUNTER — Telehealth: Payer: Self-pay | Admitting: *Deleted

## 2017-07-06 DIAGNOSIS — D649 Anemia, unspecified: Secondary | ICD-10-CM

## 2017-07-06 NOTE — Telephone Encounter (Signed)
This RN returned call to pt post MD review and discussion on noted heme and MCV.  Noted pt has had a low MCV dating back to 2014 of 74 with a heme of 11.4 Presently her MCV is 75 with heme of 8.3.  This RN discussed above with pt possibly having thalassemia - which affects size of red blood cells and not related to low iron.  Low heme is secondary to chemo therapy.  If pt feels she is physically stable and not overly affected by low heme ok not to transfuse blood.  Dicussed need to check for iron levels for appropriate advice on use of iron supplements.  Emily Perez is in agreement to the above.  She has currently instituted beets, kale, beet juice and spinach by food source. This RN validated pt's choice including obtaining supplements by food which is best method.  Appointment added for lab post injection 4/18

## 2017-07-07 ENCOUNTER — Inpatient Hospital Stay: Payer: BC Managed Care – PPO

## 2017-07-07 ENCOUNTER — Other Ambulatory Visit: Payer: BC Managed Care – PPO

## 2017-07-07 VITALS — BP 131/64 | HR 90 | Temp 98.2°F | Resp 20

## 2017-07-07 DIAGNOSIS — C50511 Malignant neoplasm of lower-outer quadrant of right female breast: Secondary | ICD-10-CM

## 2017-07-07 DIAGNOSIS — C50411 Malignant neoplasm of upper-outer quadrant of right female breast: Secondary | ICD-10-CM | POA: Diagnosis not present

## 2017-07-07 DIAGNOSIS — Z171 Estrogen receptor negative status [ER-]: Secondary | ICD-10-CM

## 2017-07-07 MED ORDER — PEGFILGRASTIM-CBQV 6 MG/0.6ML ~~LOC~~ SOSY
6.0000 mg | PREFILLED_SYRINGE | Freq: Once | SUBCUTANEOUS | Status: AC
  Administered 2017-07-07: 6 mg via SUBCUTANEOUS

## 2017-07-07 MED ORDER — PEGFILGRASTIM-CBQV 6 MG/0.6ML ~~LOC~~ SOSY
PREFILLED_SYRINGE | SUBCUTANEOUS | Status: AC
  Filled 2017-07-07: qty 0.6

## 2017-07-07 NOTE — Patient Instructions (Signed)
Pegfilgrastim injection What is this medicine? PEGFILGRASTIM (PEG fil gra stim) is a long-acting granulocyte colony-stimulating factor that stimulates the growth of neutrophils, a type of white blood cell important in the body's fight against infection. It is used to reduce the incidence of fever and infection in patients with certain types of cancer who are receiving chemotherapy that affects the bone marrow, and to increase survival after being exposed to high doses of radiation. This medicine may be used for other purposes; ask your health care provider or pharmacist if you have questions. COMMON BRAND NAME(S): Neulasta What should I tell my health care provider before I take this medicine? They need to know if you have any of these conditions: -kidney disease -latex allergy -ongoing radiation therapy -sickle cell disease -skin reactions to acrylic adhesives (On-Body Injector only) -an unusual or allergic reaction to pegfilgrastim, filgrastim, other medicines, foods, dyes, or preservatives -pregnant or trying to get pregnant -breast-feeding How should I use this medicine? This medicine is for injection under the skin. If you get this medicine at home, you will be taught how to prepare and give the pre-filled syringe or how to use the On-body Injector. Refer to the patient Instructions for Use for detailed instructions. Use exactly as directed. Tell your healthcare provider immediately if you suspect that the On-body Injector may not have performed as intended or if you suspect the use of the On-body Injector resulted in a missed or partial dose. It is important that you put your used needles and syringes in a special sharps container. Do not put them in a trash can. If you do not have a sharps container, call your pharmacist or healthcare provider to get one. Talk to your pediatrician regarding the use of this medicine in children. While this drug may be prescribed for selected conditions,  precautions do apply. Overdosage: If you think you have taken too much of this medicine contact a poison control center or emergency room at once. NOTE: This medicine is only for you. Do not share this medicine with others. What if I miss a dose? It is important not to miss your dose. Call your doctor or health care professional if you miss your dose. If you miss a dose due to an On-body Injector failure or leakage, a new dose should be administered as soon as possible using a single prefilled syringe for manual use. What may interact with this medicine? Interactions have not been studied. Give your health care provider a list of all the medicines, herbs, non-prescription drugs, or dietary supplements you use. Also tell them if you smoke, drink alcohol, or use illegal drugs. Some items may interact with your medicine. This list may not describe all possible interactions. Give your health care provider a list of all the medicines, herbs, non-prescription drugs, or dietary supplements you use. Also tell them if you smoke, drink alcohol, or use illegal drugs. Some items may interact with your medicine. What should I watch for while using this medicine? You may need blood work done while you are taking this medicine. If you are going to need a MRI, CT scan, or other procedure, tell your doctor that you are using this medicine (On-Body Injector only). What side effects may I notice from receiving this medicine? Side effects that you should report to your doctor or health care professional as soon as possible: -allergic reactions like skin rash, itching or hives, swelling of the face, lips, or tongue -dizziness -fever -pain, redness, or irritation at site   where injected -pinpoint red spots on the skin -red or dark-brown urine -shortness of breath or breathing problems -stomach or side pain, or pain at the shoulder -swelling -tiredness -trouble passing urine or change in the amount of urine Side  effects that usually do not require medical attention (report to your doctor or health care professional if they continue or are bothersome): -bone pain -muscle pain This list may not describe all possible side effects. Call your doctor for medical advice about side effects. You may report side effects to FDA at 1-800-FDA-1088. Where should I keep my medicine? Keep out of the reach of children. Store pre-filled syringes in a refrigerator between 2 and 8 degrees C (36 and 46 degrees F). Do not freeze. Keep in carton to protect from light. Throw away this medicine if it is left out of the refrigerator for more than 48 hours. Throw away any unused medicine after the expiration date. NOTE: This sheet is a summary. It may not cover all possible information. If you have questions about this medicine, talk to your doctor, pharmacist, or health care provider.  2018 Elsevier/Gold Standard (2016-03-04 12:58:03)  

## 2017-07-08 ENCOUNTER — Encounter (HOSPITAL_COMMUNITY): Payer: BC Managed Care – PPO

## 2017-07-09 LAB — BPAM RBC
Blood Product Expiration Date: 201905142359
Blood Product Expiration Date: 201905142359
Unit Type and Rh: 7300
Unit Type and Rh: 7300

## 2017-07-09 LAB — TYPE AND SCREEN
ABO/RH(D): B POS
Antibody Screen: NEGATIVE
Unit division: 0
Unit division: 0

## 2017-07-12 ENCOUNTER — Encounter: Payer: Self-pay | Admitting: Adult Health

## 2017-07-12 ENCOUNTER — Other Ambulatory Visit: Payer: Self-pay | Admitting: Oncology

## 2017-07-12 ENCOUNTER — Inpatient Hospital Stay: Payer: BC Managed Care – PPO

## 2017-07-12 ENCOUNTER — Inpatient Hospital Stay (HOSPITAL_BASED_OUTPATIENT_CLINIC_OR_DEPARTMENT_OTHER): Payer: BC Managed Care – PPO | Admitting: Adult Health

## 2017-07-12 VITALS — BP 126/74 | HR 102 | Temp 98.3°F | Resp 18 | Ht 63.5 in | Wt 182.4 lb

## 2017-07-12 DIAGNOSIS — D649 Anemia, unspecified: Secondary | ICD-10-CM | POA: Diagnosis not present

## 2017-07-12 DIAGNOSIS — C50411 Malignant neoplasm of upper-outer quadrant of right female breast: Secondary | ICD-10-CM | POA: Diagnosis not present

## 2017-07-12 DIAGNOSIS — Z8042 Family history of malignant neoplasm of prostate: Secondary | ICD-10-CM | POA: Diagnosis not present

## 2017-07-12 DIAGNOSIS — Z7982 Long term (current) use of aspirin: Secondary | ICD-10-CM | POA: Diagnosis not present

## 2017-07-12 DIAGNOSIS — E860 Dehydration: Secondary | ICD-10-CM

## 2017-07-12 DIAGNOSIS — Z171 Estrogen receptor negative status [ER-]: Secondary | ICD-10-CM | POA: Diagnosis not present

## 2017-07-12 DIAGNOSIS — Z79899 Other long term (current) drug therapy: Secondary | ICD-10-CM

## 2017-07-12 DIAGNOSIS — C773 Secondary and unspecified malignant neoplasm of axilla and upper limb lymph nodes: Secondary | ICD-10-CM | POA: Diagnosis not present

## 2017-07-12 DIAGNOSIS — Z803 Family history of malignant neoplasm of breast: Secondary | ICD-10-CM

## 2017-07-12 DIAGNOSIS — C50511 Malignant neoplasm of lower-outer quadrant of right female breast: Secondary | ICD-10-CM

## 2017-07-12 DIAGNOSIS — R530 Neoplastic (malignant) related fatigue: Secondary | ICD-10-CM

## 2017-07-12 DIAGNOSIS — R531 Weakness: Secondary | ICD-10-CM | POA: Diagnosis not present

## 2017-07-12 LAB — RETICULOCYTES
RBC.: 3.27 MIL/uL — ABNORMAL LOW (ref 3.70–5.45)
Retic Ct Pct: <0.4 % — ABNORMAL LOW (ref 0.7–2.1)

## 2017-07-12 LAB — CBC WITH DIFFERENTIAL (CANCER CENTER ONLY)
Basophils Absolute: 0 10*3/uL (ref 0.0–0.1)
Basophils Relative: 3 %
Eosinophils Absolute: 0 10*3/uL (ref 0.0–0.5)
Eosinophils Relative: 1 %
HCT: 24.8 % — ABNORMAL LOW (ref 34.8–46.6)
Hemoglobin: 8 g/dL — ABNORMAL LOW (ref 11.6–15.9)
Lymphocytes Relative: 22 %
Lymphs Abs: 0.3 10*3/uL — ABNORMAL LOW (ref 0.9–3.3)
MCH: 24.5 pg — ABNORMAL LOW (ref 25.1–34.0)
MCHC: 32.3 g/dL (ref 31.5–36.0)
MCV: 75.8 fL — ABNORMAL LOW (ref 79.5–101.0)
Monocytes Absolute: 0.1 10*3/uL (ref 0.1–0.9)
Monocytes Relative: 8 %
Neutro Abs: 0.8 10*3/uL — ABNORMAL LOW (ref 1.5–6.5)
Neutrophils Relative %: 66 %
Platelet Count: 115 10*3/uL — ABNORMAL LOW (ref 145–400)
RBC: 3.27 MIL/uL — ABNORMAL LOW (ref 3.70–5.45)
RDW: 15.6 % — ABNORMAL HIGH (ref 11.2–14.5)
WBC Count: 1.2 10*3/uL — ABNORMAL LOW (ref 3.9–10.3)

## 2017-07-12 LAB — VITAMIN B12: Vitamin B-12: 6996 pg/mL — ABNORMAL HIGH (ref 180–914)

## 2017-07-12 LAB — IRON AND TIBC
Iron: 170 ug/dL — ABNORMAL HIGH (ref 41–142)
Saturation Ratios: 56 % (ref 21–57)
TIBC: 303 ug/dL (ref 236–444)
UIBC: 133 ug/dL

## 2017-07-12 LAB — FERRITIN: Ferritin: 685 ng/mL — ABNORMAL HIGH (ref 9–269)

## 2017-07-12 NOTE — Progress Notes (Signed)
[facility]  [phone] [phone]     ID: ADONIA PORADA DOB: 1955/10/08  MR#: [id number]  TLX#:[id number]  Patient Care Team: Shirline Frees, MD as PCP - General (Family Medicine) Magrinat, Virgie Dad, MD as Consulting Physician (Oncology) Rolm Bookbinder, MD as Consulting Physician (General Surgery) Gery Pray, MD as Consulting Physician (Radiation Oncology) Allyn Kenner, MD (Dermatology) Thurnell Lose, MD as Consulting Physician (Obstetrics and Gynecology) Marchia Bond, MD as Consulting Physician (Orthopedic Surgery) Christene Slates, MD as Physician Assistant (Radiology) OTHER MD:  CHIEF COMPLAINT: Triple negative breast cancer  CURRENT TREATMENT: Neoadjuvant chemotherapy   INTERVAL HISTORY: Jenae returns today for follow up and treatment of her triple negative breast cancer accompanied by her daughter. She continues on neoadjuvant doxorubicin and cyclophosphamide dose dense--even every 14 days x 4, to be followed by weekly paclitaxel x12. Today is day 8, cycle 4.   REVIEW OF SYSTEMS: Goldie is feeling fatigued and has generalized weakness today.  She has decreased appetite, and decreased taste.  She says she doesn't feel like doing anything.  She was going to get set up for a blood transfusion last week, however declined and says that she is trying things at home to help it.  She has started beet root supplement to see if it will help.  She has some numbness in the joints of her right hand, that she says is improved today.  She says she is not drinking enough water.  Maybe 20 ounces of water per day.    Nekesha denies chest pain, palpitations, shortness of breath, cough, dizziness, mucositis, or any further concerns.  A detailed ROS was conducted and was otherwise non contributory.    HISTORY OF CURRENT ILLNESS: DEJANAE HELSER had routine bilateral screening mammography at [facility] on 04/19/2017 showing a possible abnormality in the right  breast. She underwent unilateral right diagnostic mammography with tomography and right breast ultrasonography at [facility] on 04/25/2017 showing: breast density category B.  In the right breast at the 6:00 radiant 2 cm from the nipple there was a 2 cm oval mass which by ultrasound measured 2.5 cm and was irregular and hypoechoic.  There were also at least 3 abnormal appearing lymph nodes in the right axilla.  Accordingly on 05/02/2017 she proceeded to biopsy of the right breast mass in question and lymph node sampling. The pathology from this procedure showed ([id number]): Invasive ductal carcinoma grade II. One lymph node positive for metastatic carcinoma (1/1). Prognostic indicators significant for: estrogen receptor, 0% negative and progesterone receptor, 0% negative. Proliferation marker Ki67 at 80%. HER2 not amplified with ratios HER2/CEP17 signals 1.29 and average copies per cell 2.25  The patient's subsequent history is as detailed below.    PAST MEDICAL HISTORY: Past Medical History:  Diagnosis Date  . Arthritis   . Family history of breast cancer   . Family history of prostate cancer   . Headache    migraines in the past  . Hypertension   . PONV (postoperative nausea and vomiting)    after 1st colonoscopy  Migraines in the past.   PAST SURGICAL HISTORY: Past Surgical History:  Procedure Laterality Date  . COLONOSCOPY    . FRACTURE SURGERY Left    wrist  . PORTACATH PLACEMENT Right 05/19/2017   Procedure: INSERTION PORT-A-CATH WITH ULTRASOUND;  Surgeon: Rolm Bookbinder, MD;  Location: [facility];  Service: General;  Laterality: Right;    Wrist Fracture. Torn Rotator cuff and torn meniscus.    FAMILY HISTORY Family History  Problem Relation Age of Onset  . Heart disease Mother   . Cancer Father   . Diabetes Father   . Hypertension Father   . Hyperlipidemia Father   . Heart disease Brother   . Hypertension Brother   . Hyperlipidemia Brother   . Diabetes Brother   .  Prostate cancer Brother 19  . Breast cancer Sister 9  . Breast cancer Sister 59  The patient's father died at age 64 due to heart disease and emphysema. The patient's mother died at age 64 due to heart disease. The patient has 6 brothers and 8 sisters. She notes 2 sisters with breast cancer. The 1st sister was diagnosed at age 58, and the 2nd sister was diagnosed at age 63. She notes that both sisters are alive. She also notes a paternal first cousin with breast cancer diagnosed in her 25's. The patient notes that her father also had bone cancer, but she's unsure if he had myeloma. She denies a family history of ovarian cancer.   GYNECOLOGIC HISTORY:  No LMP recorded. Patient is postmenopausal. 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 She is divorced. She lives by herself with no pets. The patient's daughter, [name], works in Therapist, art for [employer]. The patient's son, [name],  works for [employer]. The patient has  2 grandchildren. She belongs to [facility].      ADVANCED DIRECTIVES: Not in place.  At the 05/19/2017 visit the patient was given the appropriate documents to complete and notarized at her discretion   HEALTH MAINTENANCE: Social History   Tobacco Use  . Smoking status: Never Smoker  . Smokeless tobacco: Never Used  Substance Use Topics  . Alcohol use: No  . Drug use: No     Colonoscopy:   PAP: September 2018 normal  Bone density: none   Allergies  Allergen Reactions  . Latex Hives and Rash    Current Outpatient Medications  Medication Sig Dispense Refill  . acetaminophen (TYLENOL) 160 MG/5ML elixir Take 15 mg/kg by mouth every 4 (four) hours as needed for fever.    Marland Kitchen aspirin 81 MG tablet Take 81 mg by mouth daily.    . diphenhydramine-acetaminophen (TYLENOL PM)  25-500 MG TABS tablet Take 1 tablet by mouth at bedtime as needed.    . famotidine (PEPCID) 20 MG tablet Take 1 tablet (20 mg total) by mouth 2 (two) times daily. 60 tablet 3  . fluocinonide cream (LIDEX) 6.33 % Apply 1 application topically 2 (two) times daily as needed (rash).    . halobetasol (ULTRAVATE) 0.05 % cream Apply 1 application topically 2 (two) times daily as needed (rash).    . magic mouthwash w/lidocaine SOLN Take 5 mLs by mouth 3 (three) times daily as needed for mouth pain. 300 mL 0  . triamterene-hydrochlorothiazide (DYAZIDE) 37.5-25 MG capsule Take 1 capsule by mouth daily.  3  . vitamin B-12 (CYANOCOBALAMIN) 1000 MCG tablet Take 500 mcg by mouth daily. Takes 0.5 tablet    . dexamethasone (DECADRON) 4 MG tablet Take 2 tablets by mouth once a day on the day after chemotherapy and then take 2 tablets two times a day for 2 days. Take with food. (Patient not taking: Reported on 06/28/2017) 30 tablet 1  . fluconazole (DIFLUCAN) 200 MG tablet Take 1 tablet (200  mg total) by mouth daily. (Patient not taking: Reported on 06/28/2017) 3 tablet 5  . lidocaine-prilocaine (EMLA) cream Apply to affected area once (Patient not taking: Reported on 06/28/2017) 30 g 3  . LORazepam (ATIVAN) 0.5 MG tablet Take 1 tablet (0.5 mg total) by mouth at bedtime as needed (Nausea or vomiting). (Patient not taking: Reported on 06/28/2017) 30 tablet 0  . meclizine (ANTIVERT) 12.5 MG tablet Take 6.25 mg by mouth 3 (three) times daily as needed for dizziness.    . naproxen sodium (ALEVE) 220 MG tablet Take 110-220 mg by mouth daily as needed (pain). Takes 0.5-1 tablet as needed for pain    . neomycin-polymyxin-hydrocortisone (CORTISPORIN) 3.5-10000-1 OTIC suspension   0  . ondansetron (ZOFRAN-ODT) 8 MG disintegrating tablet Take 8 mg by mouth every 8 (eight) hours as needed for nausea or vomiting.    Marland Kitchen OVER THE COUNTER MEDICATION Apply 1 application topically at bedtime. Pain Relief Balm    . prochlorperazine (COMPAZINE)  10 MG tablet Take 1 tablet (10 mg total) by mouth every 6 (six) hours as needed (Nausea or vomiting). (Patient not taking: Reported on 06/28/2017) 30 tablet 1   No current facility-administered medications for this visit.     OBJECTIVE:   Vitals:   07/12/17 0934  BP: 126/74  Pulse: (!) 102  Resp: 18  Temp: 98.3 F (36.8 C)  SpO2: 100%     Body mass index is 31.8 kg/m.   Wt Readings from Last 3 Encounters:  07/12/17 182 lb 6.4 oz (82.7 kg)  07/05/17 188 lb 12.8 oz (85.6 kg)  06/28/17 184 lb 12.8 oz (83.8 kg)      ECOG FS:2 - Symptomatic, <50% confined to bed GENERAL: Patient is a well appearing female in no acute distress HEENT:  Sclerae anicteric.  Oropharynx clear and moist. No ulcerations or evidence of oropharyngeal candidiasis. Neck is supple.  NODES:  No cervical, supraclavicular, or axillary lymphadenopathy palpated.  BREAST EXAM:  Deferred. LUNGS:  Clear to auscultation bilaterally.  No wheezes or rhonchi. HEART:  Regular rate and rhythm. No murmur appreciated. ABDOMEN:  Soft, nontender.  Positive, normoactive bowel sounds. No organomegaly palpated. MSK:  No focal spinal tenderness to palpation. Full range of motion bilaterally in the upper extremities. EXTREMITIES:  No peripheral edema.   SKIN:  Clear with no obvious rashes or skin changes. No nail dyscrasia. NEURO:  Nonfocal. Well oriented.  Appropriate affect.    LAB RESULTS:  CMP     Component Value Date/Time   NA 142 07/05/2017 0912   K 3.6 07/05/2017 0912   CL 107 07/05/2017 0912   CO2 26 07/05/2017 0912   GLUCOSE 99 07/05/2017 0912   BUN 11 07/05/2017 0912   CREATININE 1.08 07/05/2017 0912   CALCIUM 9.4 07/05/2017 0912   PROT 6.8 07/05/2017 0912   ALBUMIN 3.5 07/05/2017 0912   AST 13 07/05/2017 0912   ALT 7 07/05/2017 0912   ALKPHOS 90 07/05/2017 0912   BILITOT <0.2 (L) 07/05/2017 0912   GFRNONAA 53 (L) 07/05/2017 0912   GFRAA >60 07/05/2017 0912    No results found for: TOTALPROTELP,  ALBUMINELP, A1GS, A2GS, BETS, BETA2SER, GAMS, MSPIKE, SPEI  No results found for: KPAFRELGTCHN, LAMBDASER, KAPLAMBRATIO  Lab Results  Component Value Date   WBC 1.2 (L) 07/12/2017   NEUTROABS 0.8 (L) 07/12/2017   HGB 8.0 (L) 07/12/2017   HCT 24.8 (L) 07/12/2017   MCV 75.8 (L) 07/12/2017   PLT 115 (L) 07/12/2017    _0 @  No results found for: LABCA2  No components found for: OMVEHM094  No results for input(s): INR in the last 168 hours.  No results found for: LABCA2  No results found for: BSJ628  No results found for: ZMO294  No results found for: TML465  No results found for: CA2729  No components found for: HGQUANT  No results found for: CEA1 / No results found for: CEA1   No results found for: AFPTUMOR  No results found for: CHROMOGRNA  No results found for: PSA1  Appointment on 07/12/2017  Component Date Value Ref Range Status  . Retic Ct Pct 07/12/2017 <0.4* 0.7 - 2.1 % Final  . RBC. 07/12/2017 3.27* 3.70 - 5.45 MIL/uL Final  . Retic Count, Absolute 07/12/2017 NOT CALCULATED  33.7 - 90.7 K/uL Final   Performed at [facility], [place] [address], [place], [place] [address]  . WBC Count 07/12/2017 1.2* 3.9 - 10.3 K/uL Final  . RBC 07/12/2017 3.27* 3.70 - 5.45 MIL/uL Final  . Hemoglobin 07/12/2017 8.0* 11.6 - 15.9 g/dL Final  . HCT 07/12/2017 24.8* 34.8 - 46.6 % Final  . MCV 07/12/2017 75.8* 79.5 - 101.0 fL Final  . MCH 07/12/2017 24.5* 25.1 - 34.0 pg Final  . MCHC 07/12/2017 32.3  31.5 - 36.0 g/dL Final  . RDW 07/12/2017 15.6* 11.2 - 14.5 % Final  . Platelet Count 07/12/2017 115* 145 - 400 K/uL Final  . Neutrophils Relative % 07/12/2017 66  % Final  . Neutro Abs 07/12/2017 0.8* 1.5 - 6.5 K/uL Final  . Lymphocytes Relative 07/12/2017 22  % Final  . Lymphs Abs 07/12/2017 0.3* 0.9 - 3.3 K/uL Final  . Monocytes Relative 07/12/2017 8  % Final  . Monocytes Absolute 07/12/2017 0.1  0.1 - 0.9 K/uL Final  . Eosinophils Relative  07/12/2017 1  % Final  . Eosinophils Absolute 07/12/2017 0.0  0.0 - 0.5 K/uL Final  . Basophils Relative 07/12/2017 3  % Final  . Basophils Absolute 07/12/2017 0.0  0.0 - 0.1 K/uL Final   Performed at [facility], [place] [address] [place], [place] [address]    (this displays the last labs from the last 3 days)  No results found for: TOTALPROTELP, ALBUMINELP, A1GS, A2GS, BETS, BETA2SER, GAMS, MSPIKE, SPEI (this displays SPEP labs)  No results found for: KPAFRELGTCHN, LAMBDASER, KAPLAMBRATIO (kappa/lambda light chains)  No results found for: HGBA, HGBA2QUANT, HGBFQUANT, HGBSQUAN (Hemoglobinopathy evaluation)   No results found for: LDH  No results found for: IRON, TIBC, IRONPCTSAT (Iron and TIBC)  No results found for: FERRITIN  Urinalysis No results found for: COLORURINE, APPEARANCEUR, LABSPEC, PHURINE, GLUCOSEU, HGBUR, BILIRUBINUR, KETONESUR, PROTEINUR, UROBILINOGEN, NITRITE, LEUKOCYTESUR   STUDIES: No results found.  ELIGIBLE FOR AVAILABLE RESEARCH PROTOCOL: [facility] [phone]; not a candidate for breast MRI study given history of claustrophobia  ASSESSMENT: 62 y.o. [place] Woman status post right breast upper outer quadrant biopsy 05/02/2017 for a clinical T2 N1-2, stage IIIB invasive ductal carcinoma, grade 2-3, triple negative, with an MIB-180%  (a) breast MRI 05/18/2017 shows T3 N1 disease with possible involvement of the internal mammary nodes  (1) neoadjuvant chemotherapy to consist of doxorubicin and cyclophosphamide in dose dense fashion x4 starting 05/24/2017 to be  followed by carboplatin and paclitaxel weekly x12  (2) definitive surgery to follow  (3) consider SWOG [id number] depending on final pathology results  (4) adjuvant radiation to follow  (5) genetics testing 06/22/2017, results pending  PLAN: Lucindia is fatigued.  I reviewed  her CBC with her in detail.  She continues to have anemia and declines a blood transfusion.  She is  working on this naturally with beet root.  She is also dehydrated.  She declines IV fluids today.  She has completed her first chemotherapy regimen with Doxorubicin and Cyclophosphamide.  We discussed that with her next treatment regimen, the carboplatin can continue to cause anemia.  I recommended a blood transfusion, but also gave her detailed information about anemia, dehydration, and blood transfusion process/risks in her AVS so that she can make an educated decision.  I told her that a transfusion will likely help her feel better.  She will review the information, and call us if she changes her mind.  Dr. Jana Hakim ordered an anemia work up in her labs today, and those remain pending.   Shanyah will return in one week for labs, follow up and to start Paclitaxel and Carboplatin. She knows to call for any questions or concerns prior to her next treatment with [place].  A total of (30) minutes of face-to-face time was spent with this patient with greater than 50% of that time in counseling and care-coordination.  Wilber Bihari, NP  07/12/17 10:19 AM Medical Oncology and Hematology [facility] [address] [place], [place] [address] Tel. [phone]    Fax. [phone]

## 2017-07-12 NOTE — Patient Instructions (Signed)
Blood Transfusion, Adult A blood transfusion is a procedure in which you receive donated blood, including plasma, platelets, and red blood cells, through an IV tube. You may need a blood transfusion because of illness, surgery, or injury. The blood may come from a donor. You may also be able to donate blood for yourself (autologous blood donation) before a surgery if you know that you might require a blood transfusion. The blood given in a transfusion is made up of different types of cells. You may receive:  Red blood cells. These carry oxygen to the cells in the body.  White blood cells. These help you fight infections.  Platelets. These help your blood to clot.  Plasma. This is the liquid part of your blood and it helps with fluid imbalances.  If you have hemophilia or another clotting disorder, you may also receive other types of blood products. Tell a health care provider about:  Any allergies you have.  All medicines you are taking, including vitamins, herbs, eye drops, creams, and over-the-counter medicines.  Any problems you or family members have had with anesthetic medicines.  Any blood disorders you have.  Any surgeries you have had.  Any medical conditions you have, including any recent fever or cold symptoms.  Whether you are pregnant or may be pregnant.  Any previous reactions you have had during a blood transfusion. What are the risks? Generally, this is a safe procedure. However, problems may occur, including:  Having an allergic reaction to something in the donated blood. Hives and itching may be symptoms of this type of reaction.  Fever. This may be a reaction to the white blood cells in the transfused blood. Nausea or chest pain may accompany a fever.  Iron overload. This can happen from having many transfusions.  Transfusion-related acute lung injury (TRALI). This is a rare reaction that causes lung damage. The cause is not known.TRALI can occur within hours  of a transfusion or several days later.  Sudden (acute) or delayed hemolytic reactions. This happens if your blood does not match the cells in your transfusion. Your body's defense system (immune system) may try to attack the new cells. This complication is rare. The symptoms include fever, chills, nausea, and low back pain or chest pain.  Infection or disease transmission. This is rare.  What happens before the procedure?  You will have a blood test to determine your blood type. This is necessary to know what kind of blood your body will accept and to match it to the donor blood.  If you are going to have a planned surgery, you may be able to do an autologous blood donation. This may be done in case you need to have a transfusion.  If you have had an allergic reaction to a transfusion in the past, you may be given medicine to help prevent a reaction. This medicine may be given to you by mouth or through an IV tube.  You will have your temperature, blood pressure, and pulse monitored before the transfusion.  Follow instructions from your health care provider about eating and drinking restrictions.  Ask your health care provider about: ? Changing or stopping your regular medicines. This is especially important if you are taking diabetes medicines or blood thinners. ? Taking medicines such as aspirin and ibuprofen. These medicines can thin your blood. Do not take these medicines before your procedure if your health care provider instructs you not to. What happens during the procedure?  An IV tube will  be inserted into one of your veins.  The bag of donated blood will be attached to your IV tube. The blood will then enter through your vein.  Your temperature, blood pressure, and pulse will be monitored regularly during the transfusion. This monitoring is done to detect early signs of a transfusion reaction.  If you have any signs or symptoms of a reaction, your transfusion will be stopped  and you may be given medicine.  When the transfusion is complete, your IV tube will be removed.  Pressure may be applied to the IV site for a few minutes.  A bandage (dressing) will be applied. The procedure may vary among health care providers and hospitals. What happens after the procedure?  Your temperature, blood pressure, heart rate, breathing rate, and blood oxygen level will be monitored often.  Your blood may be tested to see how you are responding to the transfusion.  You may be warmed with fluids or blankets to maintain a normal body temperature. Summary  A blood transfusion is a procedure in which you receive donated blood, including plasma, platelets, and red blood cells, through an IV tube.  Your temperature, blood pressure, and pulse will be monitored before, during, and after the transfusion.  Your blood may be tested after the transfusion to see how your body has responded. This information is not intended to replace advice given to you by your health care provider. Make sure you discuss any questions you have with your health care provider. Document Released: 03/05/2000 Document Revised: 12/04/2015 Document Reviewed: 12/04/2015 Elsevier Interactive Patient Education  2018 [employer]. Anemia Anemia is a condition in which you do not have enough red blood cells or hemoglobin. Hemoglobin is a substance in red blood cells that carries oxygen. When you do not have enough red blood cells or hemoglobin (are anemic), your body cannot get enough oxygen and your organs may not work properly. As a result, you may feel very tired or have other problems. What are the causes? Common causes of anemia include:  Excessive bleeding. Anemia can be caused by excessive bleeding inside or outside the body, including bleeding from the intestine or from periods in women.  Poor nutrition.  Long-lasting (chronic) kidney, thyroid, and liver disease.  Bone marrow disorders.  Cancer and  treatments for cancer.  HIV (human immunodeficiency virus) and AIDS (acquired immunodeficiency syndrome).  Treatments for HIV and AIDS.  Spleen problems.  Blood disorders.  Infections, medicines, and autoimmune disorders that destroy red blood cells.  What are the signs or symptoms? Symptoms of this condition include:  Minor weakness.  Dizziness.  Headache.  Feeling heartbeats that are irregular or faster than normal (palpitations).  Shortness of breath, especially with exercise.  Paleness.  Cold sensitivity.  Indigestion.  Nausea.  Difficulty sleeping.  Difficulty concentrating.  Symptoms may occur suddenly or develop slowly. If your anemia is mild, you may not have symptoms. How is this diagnosed? This condition is diagnosed based on:  Blood tests.  Your medical history.  A physical exam.  Bone marrow biopsy.  Your health care provider may also check your stool (feces) for blood and may do additional testing to look for the cause of your bleeding. You may also have other tests, including:  Imaging tests, such as a CT scan or MRI.  Endoscopy.  Colonoscopy.  How is this treated? Treatment for this condition depends on the cause. If you continue to lose a lot of blood, you may need to be treated at  a hospital. Treatment may include:  Taking supplements of iron, vitamin A35, or folic acid.  Taking a hormone medicine (erythropoietin) that can help to stimulate red blood cell growth.  Having a blood transfusion. This may be needed if you lose a lot of blood.  Making changes to your diet.  Having surgery to remove your spleen.  Follow these instructions at home:  Take over-the-counter and prescription medicines only as told by your health care provider.  Take supplements only as told by your health care provider.  Follow any diet instructions that you were given.  Keep all follow-up visits as told by your health care provider. This is  important. Contact a health care provider if:  You develop new bleeding anywhere in the body. Get help right away if:  You are very weak.  You are short of breath.  You have pain in your abdomen or chest.  You are dizzy or feel faint.  You have trouble concentrating.  You have bloody or black, tarry stools.  You vomit repeatedly or you vomit up blood. Summary  Anemia is a condition in which you do not have enough red blood cells or enough of a substance in your red blood cells that carries oxygen (hemoglobin).  Symptoms may occur suddenly or develop slowly.  If your anemia is mild, you may not have symptoms.  This condition is diagnosed with blood tests as well as a medical history and physical exam. Other tests may be needed.  Treatment for this condition depends on the cause of the anemia. This information is not intended to replace advice given to you by your health care provider. Make sure you discuss any questions you have with your health care provider. Document Released: 04/15/2004 Document Revised: 04/09/2016 Document Reviewed: 04/09/2016 Elsevier Interactive Patient Education  2018 [employer]. Dehydration, Adult Dehydration is a condition in which there is not enough fluid or water in the body. This happens when you lose more fluids than you take in. Important organs, such as the kidneys, brain, and heart, cannot function without a proper amount of fluids. Any loss of fluids from the body can lead to dehydration. Dehydration can range from mild to severe. This condition should be treated right away to prevent it from becoming severe. What are the causes? This condition may be caused by:  Vomiting.  Diarrhea.  Excessive sweating, such as from heat exposure or exercise.  Not drinking enough fluid, especially: ? When ill. ? While doing activity that requires a lot of energy.  Excessive urination.  Fever.  Infection.  Certain medicines, such as medicines  that cause the body to lose excess fluid (diuretics).  Inability to access safe drinking water.  Reduced physical ability to get adequate water and food.  What increases the risk? This condition is more likely to develop in people:  Who have a poorly controlled long-term (chronic) illness, such as diabetes, heart disease, or kidney disease.  Who are age 64 or older.  Who are disabled.  Who live in a place with high altitude.  Who play endurance sports.  What are the signs or symptoms? Symptoms of mild dehydration may include:  Thirst.  Dry lips.  Slightly dry mouth.  Dry, warm skin.  Dizziness. Symptoms of moderate dehydration may include:  Very dry mouth.  Muscle cramps.  Dark urine. Urine may be the color of tea.  Decreased urine production.  Decreased tear production.  Heartbeat that is irregular or faster than normal (palpitations).  Headache.  Light-headedness, especially when you stand up from a sitting position.  Fainting (syncope). Symptoms of severe dehydration may include:  Changes in skin, such as: ? Cold and clammy skin. ? Blotchy (mottled) or pale skin. ? Skin that does not quickly return to normal after being lightly pinched and released (poor skin turgor).  Changes in body fluids, such as: ? Extreme thirst. ? No tear production. ? Inability to sweat when body temperature is high, such as in hot weather. ? Very little urine production.  Changes in vital signs, such as: ? Weak pulse. ? Pulse that is more than 100 beats a minute when sitting still. ? Rapid breathing. ? Low blood pressure.  Other changes, such as: ? Sunken eyes. ? Cold hands and feet. ? Confusion. ? Lack of energy (lethargy). ? Difficulty waking up from sleep. ? Short-term weight loss. ? Unconsciousness. How is this diagnosed? This condition is diagnosed based on your symptoms and a physical exam. Blood and urine tests may be done to help confirm the  diagnosis. How is this treated? Treatment for this condition depends on the severity. Mild or moderate dehydration can often be treated at home. Treatment should be started right away. Do not wait until dehydration becomes severe. Severe dehydration is an emergency and it needs to be treated in a hospital. Treatment for mild dehydration may include:  Drinking more fluids.  Replacing salts and minerals in your blood (electrolytes) that you may have lost. Treatment for moderate dehydration may include:  Drinking an oral rehydration solution (ORS). This is a drink that helps you replace fluids and electrolytes (rehydrate). It can be found at pharmacies and retail stores. Treatment for severe dehydration may include:  Receiving fluids through an IV tube.  Receiving an electrolyte solution through a feeding tube that is passed through your nose and into your stomach (nasogastric tube, or NG tube).  Correcting any abnormalities in electrolytes.  Treating the underlying cause of dehydration. Follow these instructions at home:  If directed by your health care provider, drink an ORS: ? Make an ORS by following instructions on the package. ? Start by drinking small amounts, about  cup (120 mL) every 5-10 minutes. ? Slowly increase how much you drink until you have taken the amount recommended by your health care provider.  Drink enough clear fluid to keep your urine clear or pale yellow. If you were told to drink an ORS, finish the ORS first, then start slowly drinking other clear fluids. Drink fluids such as: ? Water. Do not drink only water. Doing that can lead to having too little salt (sodium) in the body (hyponatremia). ? Ice chips. ? Fruit juice that you have added water to (diluted fruit juice). ? Low-calorie sports drinks.  Avoid: ? Alcohol. ? Drinks that contain a lot of sugar. These include high-calorie sports drinks, fruit juice that is not diluted, and  soda. ? Caffeine. ? Foods that are greasy or contain a lot of fat or sugar.  Take over-the-counter and prescription medicines only as told by your health care provider.  Do not take sodium tablets. This can lead to having too much sodium in the body (hypernatremia).  Eat foods that contain a healthy balance of electrolytes, such as bananas, oranges, potatoes, tomatoes, and spinach.  Keep all follow-up visits as told by your health care provider. This is important. Contact a health care provider if:  You have abdominal pain that: ? Gets worse. ? Stays in one area (localizes).  You have  a rash.  You have a stiff neck.  You are more irritable than usual.  You are sleepier or more difficult to wake up than usual.  You feel weak or dizzy.  You feel very thirsty.  You have urinated only a small amount of very dark urine over 6-8 hours. Get help right away if:  You have symptoms of severe dehydration.  You cannot drink fluids without vomiting.  Your symptoms get worse with treatment.  You have a fever.  You have a severe headache.  You have vomiting or diarrhea that: ? Gets worse. ? Does not go away.  You have blood or green matter (bile) in your vomit.  You have blood in your stool. This may cause stool to look black and tarry.  You have not urinated in 6-8 hours.  You faint.  Your heart rate while sitting still is over 100 beats a minute.  You have trouble breathing. This information is not intended to replace advice given to you by your health care provider. Make sure you discuss any questions you have with your health care provider. Document Released: 03/08/2005 Document Revised: 10/03/2015 Document Reviewed: 05/02/2015 Elsevier Interactive Patient Education  [employer].

## 2017-07-13 ENCOUNTER — Telehealth: Payer: Self-pay | Admitting: Medical Oncology

## 2017-07-13 ENCOUNTER — Other Ambulatory Visit: Payer: Self-pay | Admitting: *Deleted

## 2017-07-13 ENCOUNTER — Inpatient Hospital Stay: Payer: BC Managed Care – PPO

## 2017-07-13 ENCOUNTER — Telehealth: Payer: Self-pay

## 2017-07-13 ENCOUNTER — Telehealth: Payer: Self-pay | Admitting: *Deleted

## 2017-07-13 DIAGNOSIS — D649 Anemia, unspecified: Secondary | ICD-10-CM

## 2017-07-13 DIAGNOSIS — C50511 Malignant neoplasm of lower-outer quadrant of right female breast: Secondary | ICD-10-CM

## 2017-07-13 DIAGNOSIS — Z171 Estrogen receptor negative status [ER-]: Secondary | ICD-10-CM

## 2017-07-13 DIAGNOSIS — C50411 Malignant neoplasm of upper-outer quadrant of right female breast: Secondary | ICD-10-CM | POA: Diagnosis not present

## 2017-07-13 LAB — CBC WITH DIFFERENTIAL (CANCER CENTER ONLY)
Basophils Absolute: 0 10*3/uL (ref 0.0–0.1)
Basophils Relative: 4 %
Eosinophils Absolute: 0 10*3/uL (ref 0.0–0.5)
Eosinophils Relative: 1 %
HCT: 24 % — ABNORMAL LOW (ref 34.8–46.6)
Hemoglobin: 7.7 g/dL — ABNORMAL LOW (ref 11.6–15.9)
Lymphocytes Relative: 31 %
Lymphs Abs: 0.3 10*3/uL — ABNORMAL LOW (ref 0.9–3.3)
MCH: 24.2 pg — ABNORMAL LOW (ref 25.1–34.0)
MCHC: 32.1 g/dL (ref 31.5–36.0)
MCV: 75.5 fL — ABNORMAL LOW (ref 79.5–101.0)
Monocytes Absolute: 0.2 10*3/uL (ref 0.1–0.9)
Monocytes Relative: 19 %
Neutro Abs: 0.5 10*3/uL — CL (ref 1.5–6.5)
Neutrophils Relative %: 45 %
Platelet Count: 92 10*3/uL — ABNORMAL LOW (ref 145–400)
RBC: 3.18 MIL/uL — ABNORMAL LOW (ref 3.70–5.45)
RDW: 15.4 % — ABNORMAL HIGH (ref 11.2–14.5)
WBC Count: 1.1 10*3/uL — ABNORMAL LOW (ref 3.9–10.3)

## 2017-07-13 LAB — SAMPLE TO BLOOD BANK

## 2017-07-13 MED ORDER — HEPARIN SOD (PORK) LOCK FLUSH 100 UNIT/ML IV SOLN
500.0000 [IU] | Freq: Once | INTRAVENOUS | Status: AC
  Administered 2017-07-13: 500 [IU] via INTRAVENOUS
  Filled 2017-07-13: qty 5

## 2017-07-13 MED ORDER — SODIUM CHLORIDE 0.9% FLUSH
10.0000 mL | Freq: Once | INTRAVENOUS | Status: AC
  Administered 2017-07-13: 10 mL via INTRAVENOUS
  Filled 2017-07-13: qty 10

## 2017-07-13 NOTE — Telephone Encounter (Signed)
Cancel injection appointments per 4/23 los [facility]) called and spoke with patient and she is aware of the  Changes.

## 2017-07-13 NOTE — Telephone Encounter (Signed)
I told pt we got her message and to expect a call from a scheduler about her lab and transfusion appt.

## 2017-07-13 NOTE — Patient Instructions (Signed)
Implanted Port Home Guide An implanted port is a type of central line that is placed under the skin. Central lines are used to provide IV access when treatment or nutrition needs to be given through a person's veins. Implanted ports are used for long-term IV access. An implanted port may be placed because:  You need IV medicine that would be irritating to the small veins in your hands or arms.  You need long-term IV medicines, such as antibiotics.  You need IV nutrition for a long period.  You need frequent blood draws for lab tests.  You need dialysis.  Implanted ports are usually placed in the chest area, but they can also be placed in the upper arm, the abdomen, or the leg. An implanted port has two main parts:  Reservoir. The reservoir is round and will appear as a small, raised area under your skin. The reservoir is the part where a needle is inserted to give medicines or draw blood.  Catheter. The catheter is a thin, flexible tube that extends from the reservoir. The catheter is placed into a large vein. Medicine that is inserted into the reservoir goes into the catheter and then into the vein.  How will I care for my incision site? Do not get the incision site wet. Bathe or shower as directed by your health care provider. How is my port accessed? Special steps must be taken to access the port:  Before the port is accessed, a numbing cream can be placed on the skin. This helps numb the skin over the port site.  Your health care provider uses a sterile technique to access the port. ? Your health care provider must put on a mask and sterile gloves. ? The skin over your port is cleaned carefully with an antiseptic and allowed to dry. ? The port is gently pinched between sterile gloves, and a needle is inserted into the port.  Only "non-coring" port needles should be used to access the port. Once the port is accessed, a blood return should be checked. This helps ensure that the port  is in the vein and is not clogged.  If your port needs to remain accessed for a constant infusion, a clear (transparent) bandage will be placed over the needle site. The bandage and needle will need to be changed every week, or as directed by your health care provider.  Keep the bandage covering the needle clean and dry. Do not get it wet. Follow your health care provider's instructions on how to take a shower or bath while the port is accessed.  If your port does not need to stay accessed, no bandage is needed over the port.  What is flushing? Flushing helps keep the port from getting clogged. Follow your health care provider's instructions on how and when to flush the port. Ports are usually flushed with saline solution or a medicine called heparin. The need for flushing will depend on how the port is used.  If the port is used for intermittent medicines or blood draws, the port will need to be flushed: ? After medicines have been given. ? After blood has been drawn. ? As part of routine maintenance.  If a constant infusion is running, the port may not need to be flushed.  How long will my port stay implanted? The port can stay in for as long as your health care provider thinks it is needed. When it is time for the port to come out, surgery will be   done to remove it. The procedure is similar to the one performed when the port was put in. When should I seek immediate medical care? When you have an implanted port, you should seek immediate medical care if:  You notice a bad smell coming from the incision site.  You have swelling, redness, or drainage at the incision site.  You have more swelling or pain at the port site or the surrounding area.  You have a fever that is not controlled with medicine.  This information is not intended to replace advice given to you by your health care provider. Make sure you discuss any questions you have with your health care provider. Document  Released: 03/08/2005 Document Revised: 08/14/2015 Document Reviewed: 11/13/2012 Elsevier Interactive Patient Education  2017 [employer]  

## 2017-07-13 NOTE — Telephone Encounter (Signed)
This RN received a VM from pt stating " I have decided to get a blood transfusion ", " I am very fatigued "  " I can do it asap - just need 1.5 hours notice."  This RN sent an urgent inbox per above.  Called returned and obtained identified VM - message left per above.

## 2017-07-14 ENCOUNTER — Other Ambulatory Visit: Payer: Self-pay

## 2017-07-14 ENCOUNTER — Inpatient Hospital Stay: Payer: BC Managed Care – PPO

## 2017-07-14 ENCOUNTER — Encounter: Payer: Self-pay | Admitting: Adult Health

## 2017-07-14 ENCOUNTER — Inpatient Hospital Stay (HOSPITAL_BASED_OUTPATIENT_CLINIC_OR_DEPARTMENT_OTHER): Payer: BC Managed Care – PPO | Admitting: Adult Health

## 2017-07-14 ENCOUNTER — Other Ambulatory Visit: Payer: BC Managed Care – PPO

## 2017-07-14 DIAGNOSIS — D701 Agranulocytosis secondary to cancer chemotherapy: Secondary | ICD-10-CM

## 2017-07-14 DIAGNOSIS — Z171 Estrogen receptor negative status [ER-]: Secondary | ICD-10-CM | POA: Diagnosis not present

## 2017-07-14 DIAGNOSIS — C50511 Malignant neoplasm of lower-outer quadrant of right female breast: Secondary | ICD-10-CM | POA: Diagnosis not present

## 2017-07-14 DIAGNOSIS — C773 Secondary and unspecified malignant neoplasm of axilla and upper limb lymph nodes: Secondary | ICD-10-CM | POA: Diagnosis not present

## 2017-07-14 DIAGNOSIS — D649 Anemia, unspecified: Secondary | ICD-10-CM

## 2017-07-14 DIAGNOSIS — C50411 Malignant neoplasm of upper-outer quadrant of right female breast: Secondary | ICD-10-CM | POA: Diagnosis not present

## 2017-07-14 DIAGNOSIS — T451X5A Adverse effect of antineoplastic and immunosuppressive drugs, initial encounter: Secondary | ICD-10-CM | POA: Insufficient documentation

## 2017-07-14 LAB — PREPARE RBC (CROSSMATCH)

## 2017-07-14 MED ORDER — CIPROFLOXACIN HCL 500 MG PO TABS: 500.0000 mg | ORAL_TABLET | Freq: Two times a day (BID) | ORAL | 0 refills | Status: DC

## 2017-07-14 MED ORDER — ACETAMINOPHEN 325 MG PO TABS
ORAL_TABLET | ORAL | Status: AC
  Filled 2017-07-14: qty 2

## 2017-07-14 MED ORDER — DIPHENHYDRAMINE HCL 25 MG PO CAPS
25.0000 mg | ORAL_CAPSULE | Freq: Once | ORAL | Status: AC
  Administered 2017-07-14: 25 mg via ORAL

## 2017-07-14 MED ORDER — ACETAMINOPHEN 325 MG PO TABS
650.0000 mg | ORAL_TABLET | Freq: Once | ORAL | Status: AC
  Administered 2017-07-14: 650 mg via ORAL

## 2017-07-14 MED ORDER — DIPHENHYDRAMINE HCL 25 MG PO CAPS
ORAL_CAPSULE | ORAL | Status: AC
  Filled 2017-07-14: qty 1

## 2017-07-14 NOTE — Progress Notes (Signed)
[place]  [phone] [phone]     ID: Emily Perez DOB: [birth year]  MR#: [id number]  YTK#:[id number]  Patient Care Team: Shirline Frees, MD as PCP - General (Family Medicine) Magrinat, Virgie Dad, MD as Consulting Physician (Oncology) Rolm Bookbinder, MD as Consulting Physician (General Surgery) Gery Pray, MD as Consulting Physician (Radiation Oncology) Allyn Kenner, MD (Dermatology) Thurnell Lose, MD as Consulting Physician (Obstetrics and Gynecology) Marchia Bond, MD as Consulting Physician (Orthopedic Surgery) Christene Slates, MD as Physician Assistant (Radiology) OTHER MD:  CHIEF COMPLAINT: Triple negative breast cancer  CURRENT TREATMENT: Neoadjuvant chemotherapy   INTERVAL HISTORY: Emily Perez returns today for follow up and treatment of her triple negative breast cancer accompanied by her daughter. She continues on neoadjuvant doxorubicin and cyclophosphamide dose dense--even every 14 days x 4, to be followed by weekly paclitaxel x12. She is currently cycle 4 day 10 of Doxorubicin/Cyclophosphamide.  She is receiving a blood transfusion today due to symptomatic anemia.    REVIEW OF SYSTEMS: Emily Perez is doing well today.  She is fatigued. She says she is so tired that she decided to come in for the transfusion recommended last week because she says her quality of life is suffering.  She is also neutropenic today, but she denies any fevers or chills.  She does not have a cough.  A detailed ROS was conducted today and is otherwise non contributory.   HISTORY OF CURRENT ILLNESS: Emily Perez had routine bilateral screening mammography at [facility] on 04/19/2017 showing a possible abnormality in the right breast. She underwent unilateral right diagnostic mammography with tomography and right breast ultrasonography at [facility] on 04/25/2017 showing: breast density category B.  In the right breast at the 6:00 radiant 2 cm from the nipple there was  a 2 cm oval mass which by ultrasound measured 2.5 cm and was irregular and hypoechoic.  There were also at least 3 abnormal appearing lymph nodes in the right axilla.  Accordingly on 05/02/2017 she proceeded to biopsy of the right breast mass in question and lymph node sampling. The pathology from this procedure showed ([id number]): Invasive ductal carcinoma grade II. One lymph node positive for metastatic carcinoma (1/1). Prognostic indicators significant for: estrogen receptor, 0% negative and progesterone receptor, 0% negative. Proliferation marker Ki67 at 80%. HER2 not amplified with ratios HER2/CEP17 signals 1.29 and average copies per cell 2.25  The patient's subsequent history is as detailed below.    PAST MEDICAL HISTORY: Past Medical History:  Diagnosis Date  . Arthritis   . Family history of breast cancer   . Family history of prostate cancer   . Headache    migraines in the past  . Hypertension   . PONV (postoperative nausea and vomiting)    after 1st colonoscopy  Migraines in the past.   PAST SURGICAL HISTORY: Past Surgical History:  Procedure Laterality Date  . COLONOSCOPY    . FRACTURE SURGERY Left    wrist  . PORTACATH PLACEMENT Right 05/19/2017   Procedure: INSERTION PORT-A-CATH WITH ULTRASOUND;  Surgeon: Rolm Bookbinder, MD;  Location: [place];  Service: General;  Laterality: Right;    Wrist Fracture. Torn Rotator cuff and torn meniscus.    FAMILY HISTORY Family History  Problem Relation Age of Onset  . Heart disease Mother   . Cancer Father   . Diabetes Father   . Hypertension Father   . Hyperlipidemia Father   . Heart disease Brother   . Hypertension Brother   . Hyperlipidemia Brother   .  Diabetes Brother   . Prostate cancer Brother 24  . Breast cancer Sister 55  . Breast cancer Sister 35  The patient's father died at age 69 due to heart disease and emphysema. The patient's mother died at age 71 due to heart disease. The patient has 6 brothers and 8  sisters. She notes 2 sisters with breast cancer. The 1st sister was diagnosed at age 46, and the 2nd sister was diagnosed at age 78. She notes that both sisters are alive. She also notes a paternal first cousin with breast cancer diagnosed in her 35's. The patient notes that her father also had bone cancer, but she's unsure if he had myeloma. She denies a family history of ovarian cancer.   GYNECOLOGIC HISTORY:  No LMP recorded. Patient is postmenopausal. 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 She is divorced. She lives by herself with no pets. The patient's daughter, [name], works in Therapist, art for [employer]. The patient's son, [name],  works for [employer]. The patient has  2 grandchildren. She belongs to [facility].      ADVANCED DIRECTIVES: Not in place.  At the 05/19/2017 visit the patient was given the appropriate documents to complete and notarized at her discretion   HEALTH MAINTENANCE: Social History   Tobacco Use  . Smoking status: Never Smoker  . Smokeless tobacco: Never Used  Substance Use Topics  . Alcohol use: No  . Drug use: No     Colonoscopy:   PAP: September 2018 normal  Bone density: none   Allergies  Allergen Reactions  . Latex Hives and Rash    Current Outpatient Medications  Medication Sig Dispense Refill  . acetaminophen (TYLENOL) 160 MG/5ML elixir Take 15 mg/kg by mouth every 4 (four) hours as needed for fever.    Marland Kitchen aspirin 81 MG tablet Take 81 mg by mouth daily.    . ciprofloxacin (CIPRO) 500 MG tablet Take 1 tablet (500 mg total) by mouth 2 (two) times daily. 10 tablet 0  . dexamethasone (DECADRON) 4 MG tablet Take 2 tablets by mouth once a day on the day after chemotherapy and then take 2 tablets two times a day for 2 days. Take with food. (Patient  not taking: Reported on 06/28/2017) 30 tablet 1  . diphenhydramine-acetaminophen (TYLENOL PM) 25-500 MG TABS tablet Take 1 tablet by mouth at bedtime as needed.    . famotidine (PEPCID) 20 MG tablet Take 1 tablet (20 mg total) by mouth 2 (two) times daily. 60 tablet 3  . fluconazole (DIFLUCAN) 200 MG tablet Take 1 tablet (200 mg total) by mouth daily. (Patient not taking: Reported on 06/28/2017) 3 tablet 5  . fluocinonide cream (LIDEX) 5.10 % Apply 1 application topically 2 (two) times daily as needed (rash).    . halobetasol (ULTRAVATE) 0.05 % cream Apply 1 application topically 2 (two) times daily as needed (rash).    Marland Kitchen lidocaine-prilocaine (EMLA) cream Apply to affected area once (Patient not taking: Reported on 06/28/2017) 30 g 3  . LORazepam (ATIVAN) 0.5 MG tablet Take 1 tablet (0.5 mg total) by mouth at bedtime as needed (Nausea or vomiting). (Patient not taking: Reported on 06/28/2017) 30 tablet 0  . magic mouthwash w/lidocaine SOLN Take 5 mLs by mouth 3 (three) times daily as needed for mouth pain.  300 mL 0  . meclizine (ANTIVERT) 12.5 MG tablet Take 6.25 mg by mouth 3 (three) times daily as needed for dizziness.    . naproxen sodium (ALEVE) 220 MG tablet Take 110-220 mg by mouth daily as needed (pain). Takes 0.5-1 tablet as needed for pain    . neomycin-polymyxin-hydrocortisone (CORTISPORIN) 3.5-10000-1 OTIC suspension   0  . ondansetron (ZOFRAN-ODT) 8 MG disintegrating tablet Take 8 mg by mouth every 8 (eight) hours as needed for nausea or vomiting.    Marland Kitchen OVER THE COUNTER MEDICATION Apply 1 application topically at bedtime. Pain Relief Balm    . prochlorperazine (COMPAZINE) 10 MG tablet Take 1 tablet (10 mg total) by mouth every 6 (six) hours as needed (Nausea or vomiting). (Patient not taking: Reported on 06/28/2017) 30 tablet 1  . triamterene-hydrochlorothiazide (DYAZIDE) 37.5-25 MG capsule Take 1 capsule by mouth daily.  3  . vitamin B-12 (CYANOCOBALAMIN) 1000 MCG tablet Take 500 mcg by mouth  daily. Takes 0.5 tablet     No current facility-administered medications for this visit.    Facility-Administered Medications Ordered in Other Visits  Medication Dose Route Frequency Provider Last Rate Last Dose  . acetaminophen (TYLENOL) tablet 650 mg  650 mg Oral Once Magrinat, Virgie Dad, MD      . diphenhydrAMINE (BENADRYL) capsule 25 mg  25 mg Oral Once Magrinat, Virgie Dad, MD        OBJECTIVE:  Patient seen in infusion room, See CHL for vitals There were no vitals filed for this visit.   There is no height or weight on file to calculate BMI.   Wt Readings from Last 3 Encounters:  07/12/17 182 lb 6.4 oz (82.7 kg)  07/05/17 188 lb 12.8 oz (85.6 kg)  06/28/17 184 lb 12.8 oz (83.8 kg)  ECOG FS:2 - Symptomatic, <50% confined to bed GENERAL: Patient is a tired appearing female in no acute distress HEENT:  Sclerae anicteric.  Oropharynx clear and moist. No ulcerations or evidence of oropharyngeal candidiasis. Neck is supple.  NODES:  No cervical, supraclavicular, or axillary lymphadenopathy palpated.  BREAST EXAM:  Deferred. LUNGS:  Clear to auscultation bilaterally.  No wheezes or rhonchi. HEART:  Regular rate and rhythm. No murmur appreciated. ABDOMEN:  Soft, nontender.  Positive, normoactive bowel sounds. No organomegaly palpated. MSK:  No focal spinal tenderness to palpation. Full range of motion bilaterally in the upper extremities. EXTREMITIES:  No peripheral edema.   SKIN:  Clear with no obvious rashes or skin changes. No nail dyscrasia. NEURO:  Nonfocal. Well oriented.  Appropriate affect.    LAB RESULTS:  CMP     Component Value Date/Time   NA 142 07/05/2017 0912   K 3.6 07/05/2017 0912   CL 107 07/05/2017 0912   CO2 26 07/05/2017 0912   GLUCOSE 99 07/05/2017 0912   BUN 11 07/05/2017 0912   CREATININE 1.08 07/05/2017 0912   CALCIUM 9.4 07/05/2017 0912   PROT 6.8 07/05/2017 0912   ALBUMIN 3.5 07/05/2017 0912   AST 13 07/05/2017 0912   ALT 7 07/05/2017 0912    ALKPHOS 90 07/05/2017 0912   BILITOT <0.2 (L) 07/05/2017 0912   GFRNONAA 53 (L) 07/05/2017 0912   GFRAA >60 07/05/2017 0912    No results found for: TOTALPROTELP, ALBUMINELP, A1GS, A2GS, BETS, BETA2SER, GAMS, MSPIKE, SPEI  No results found for: KPAFRELGTCHN, LAMBDASER, KAPLAMBRATIO  Lab Results  Component Value Date   WBC 1.1 (L) 07/13/2017   NEUTROABS 0.5 (LL) 07/13/2017   HGB 7.7 (L) 07/13/2017  HCT 24.0 (L) 07/13/2017   MCV 75.5 (L) 07/13/2017   PLT 92 (L) 07/13/2017    _0 @  No results found for: LABCA2  No components found for: YMEBRA309  No results for input(s): INR in the last 168 hours.  No results found for: LABCA2  No results found for: MMH680  No results found for: SUP103  No results found for: PRX458  No results found for: CA2729  No components found for: HGQUANT  No results found for: CEA1 / No results found for: CEA1   No results found for: AFPTUMOR  No results found for: CHROMOGRNA  No results found for: PSA1  Infusion on 07/14/2017  Component Date Value Ref Range Status  . ABO/RH(D) 07/13/2017 B POS   Final  . Antibody Screen 07/13/2017 PENDING   Incomplete  . Sample Expiration 07/13/2017    Final                   Value:07/16/2017 Performed at [facility], [place] [address], [place], [place] [address]   . Order Confirmation 07/13/2017    Final                   Value:ORDER PROCESSED BY BLOOD BANK Performed at [facility], [place] [address] [place], [place] [address]   . Order Confirmation 07/14/2017    Final                   Value:ORDER PROCESSED BY BLOOD BANK Performed at [facility], [place] [address] [place], [place] [address]   Appointment on 07/13/2017  Component Date Value Ref Range Status  . WBC Count 07/13/2017 1.1* 3.9 - 10.3 K/uL Final  . RBC 07/13/2017 3.18* 3.70 - 5.45 MIL/uL Final  . Hemoglobin 07/13/2017 7.7* 11.6 - 15.9 g/dL Final  . HCT 07/13/2017  24.0* 34.8 - 46.6 % Final  . MCV 07/13/2017 75.5* 79.5 - 101.0 fL Final  . MCH 07/13/2017 24.2* 25.1 - 34.0 pg Final  . MCHC 07/13/2017 32.1  31.5 - 36.0 g/dL Final  . RDW 07/13/2017 15.4* 11.2 - 14.5 % Final  . Platelet Count 07/13/2017 92* 145 - 400 K/uL Final  . Neutrophils Relative % 07/13/2017 45  % Final  . Neutro Abs 07/13/2017 0.5* 1.5 - 6.5 K/uL Final   CRITICAL RESULT CALLED TO, READ BACK BY AND VERIFIED WITH: VAL DODD AT 1615.RB   . Lymphocytes Relative 07/13/2017 31  % Final  . Lymphs Abs 07/13/2017 0.3* 0.9 - 3.3 K/uL Final  . Monocytes Relative 07/13/2017 19  % Final  . Monocytes Absolute 07/13/2017 0.2  0.1 - 0.9 K/uL Final  . Eosinophils Relative 07/13/2017 1  % Final  . Eosinophils Absolute 07/13/2017 0.0  0.0 - 0.5 K/uL Final  . Basophils Relative 07/13/2017 4  % Final  . Basophils Absolute 07/13/2017 0.0  0.0 - 0.1 K/uL Final   Performed at [facility], [place] [address], [place], [place] [address]  . Blood Bank Specimen 07/13/2017 SAMPLE AVAILABLE FOR TESTING   Final  . Sample Expiration 07/13/2017    Final                   Value:07/16/2017 Performed at [facility], [place] [address], [place], [place] [address]   Appointment on 07/12/2017  Component Date Value Ref Range Status  . Vitamin B-12 07/12/2017 6,996* 180 - 914 pg/mL Final   Comment: (NOTE) This assay is not  validated for testing neonatal or myeloproliferative syndrome specimens for Vitamin B12 levels. Performed at [facility], [place] [address] [place], [place] [address]   . Ferritin 07/12/2017 685* 9 - 269 ng/mL Final   Performed at [facility], [place] [address] [place], [place] [address]  . Iron 07/12/2017 170* 41 - 142 ug/dL Final  . TIBC 07/12/2017 303  236 - 444 ug/dL Final  . Saturation Ratios 07/12/2017 56  21 - 57 % Final  . UIBC 07/12/2017 133  ug/dL Final   Performed at [facility], [place]  [address], [place], [place] [address]  . Retic Ct Pct 07/12/2017 <0.4* 0.7 - 2.1 % Final  . RBC. 07/12/2017 3.27* 3.70 - 5.45 MIL/uL Final  . Retic Count, Absolute 07/12/2017 NOT CALCULATED  33.7 - 90.7 K/uL Final   Performed at [facility], [place] [address], [place], [place] [address]  . WBC Count 07/12/2017 1.2* 3.9 - 10.3 K/uL Final  . RBC 07/12/2017 3.27* 3.70 - 5.45 MIL/uL Final  . Hemoglobin 07/12/2017 8.0* 11.6 - 15.9 g/dL Final  . HCT 07/12/2017 24.8* 34.8 - 46.6 % Final  . MCV 07/12/2017 75.8* 79.5 - 101.0 fL Final  . MCH 07/12/2017 24.5* 25.1 - 34.0 pg Final  . MCHC 07/12/2017 32.3  31.5 - 36.0 g/dL Final  . RDW 07/12/2017 15.6* 11.2 - 14.5 % Final  . Platelet Count 07/12/2017 115* 145 - 400 K/uL Final  . Neutrophils Relative % 07/12/2017 66  % Final  . Neutro Abs 07/12/2017 0.8* 1.5 - 6.5 K/uL Final  . Lymphocytes Relative 07/12/2017 22  % Final  . Lymphs Abs 07/12/2017 0.3* 0.9 - 3.3 K/uL Final  . Monocytes Relative 07/12/2017 8  % Final  . Monocytes Absolute 07/12/2017 0.1  0.1 - 0.9 K/uL Final  . Eosinophils Relative 07/12/2017 1  % Final  . Eosinophils Absolute 07/12/2017 0.0  0.0 - 0.5 K/uL Final  . Basophils Relative 07/12/2017 3  % Final  . Basophils Absolute 07/12/2017 0.0  0.0 - 0.1 K/uL Final   Performed at [facility], [place] [address] [place], [place] [address]    (this displays the last labs from the last 3 days)  No results found for: TOTALPROTELP, ALBUMINELP, A1GS, A2GS, BETS, BETA2SER, GAMS, MSPIKE, SPEI (this displays SPEP labs)  No results found for: KPAFRELGTCHN, LAMBDASER, KAPLAMBRATIO (kappa/lambda light chains)  No results found for: HGBA, HGBA2QUANT, HGBFQUANT, HGBSQUAN (Hemoglobinopathy evaluation)   No results found for: LDH  Lab Results  Component Value Date   IRON 170 (H) 07/12/2017   TIBC 303 07/12/2017   IRONPCTSAT 56 07/12/2017   (Iron and TIBC)  Lab Results  Component Value Date    FERRITIN 685 (H) 07/12/2017    Urinalysis No results found for: COLORURINE, APPEARANCEUR, LABSPEC, PHURINE, GLUCOSEU, HGBUR, BILIRUBINUR, KETONESUR, PROTEINUR, UROBILINOGEN, NITRITE, LEUKOCYTESUR   STUDIES: No results found.  ELIGIBLE FOR AVAILABLE RESEARCH PROTOCOL: [place] [facility] [phone]; not a candidate for breast MRI study given history of claustrophobia  ASSESSMENT: 62 y.o. [place] Woman status post right breast upper outer quadrant biopsy 05/02/2017 for a clinical T2 N1-2, stage IIIB invasive ductal carcinoma, grade 2-3, triple negative, with an MIB-180%  (a) breast MRI 05/18/2017 shows T3 N1 disease with possible involvement of the internal mammary nodes  (1) neoadjuvant chemotherapy to consist of doxorubicin and cyclophosphamide in dose dense fashion x4 starting 05/24/2017 to be  followed by carboplatin and paclitaxel weekly x12  (  2) definitive surgery to follow  (3) consider SWOG [phone] depending on final pathology results  (4) adjuvant radiation to follow  (5) genetics testing 06/22/2017, results pending  PLAN:  Emily Perez is doing moderately well today.  Her anemia is worsening. She will receive two units of PRBCs today.  She is aware of risks and benefits.  She is also neutropenic.  I reviewed neutropenic precautions with her in detail.  I also called in Cipro BID x 5 days for her to take.  She will return next week for labs, f/u and possibly chemotherapy.    Emily Perez knows to call for any questions or concerns prior to her next appointment with [place].    A total of (20) minutes of face-to-face time was spent with this patient with greater than 50% of that time in counseling and care-coordination.  Wilber Bihari, NP  07/14/17 9:05 AM Medical Oncology and Hematology [facility] [address] [place], [place] [address] Tel. [phone]    Fax. [phone]

## 2017-07-14 NOTE — Patient Instructions (Addendum)
Blood Transfusion, Care After This sheet gives you information about how to care for yourself after your procedure. Your doctor may also give you more specific instructions. If you have problems or questions, contact your doctor. Follow these instructions at home:  Take over-the-counter and prescription medicines only as told by your doctor.  Go back to your normal activities as told by your doctor.  Follow instructions from your doctor about how to take care of the area where an IV tube was put into your vein (insertion site). Make sure you: ? Wash your hands with soap and water before you change your bandage (dressing). If there is no soap and water, use hand sanitizer. ? Change your bandage as told by your doctor.  Check your IV insertion site every day for signs of infection. Check for: ? More redness, swelling, or pain. ? More fluid or blood. ? Warmth. ? Pus or a bad smell. Contact a doctor if:  You have more redness, swelling, or pain around the IV insertion site..  You have more fluid or blood coming from the IV insertion site.  Your IV insertion site feels warm to the touch.  You have pus or a bad smell coming from the IV insertion site.  Your pee (urine) turns pink, red, or brown.  You feel weak after doing your normal activities. Get help right away if:  You have signs of a serious allergic or body defense (immune) system reaction, including: ? Itchiness. ? Hives. ? Trouble breathing. ? Anxiety. ? Pain in your chest or lower back. ? Fever, flushing, and chills. ? Fast pulse. ? Rash. ? Watery poop (diarrhea). ? Throwing up (vomiting). ? Dark pee. ? Serious headache. ? Dizziness. ? Stiff neck. ? Yellow color in your face or the white parts of your eyes (jaundice). Summary  After a blood transfusion, return to your normal activities as told by your doctor.  Every day, check for signs of infection where the IV tube was put into your vein.  Some signs of  infection are warm skin, more redness and pain, more fluid or blood, and pus or a bad smell where the needle went in.  Contact your doctor if you feel weak or have any unusual symptoms. This information is not intended to replace advice given to you by your health care provider. Make sure you discuss any questions you have with your health care provider. Document Released: 03/29/2014 Document Revised: 10/31/2015 Document Reviewed: 10/31/2015 Elsevier Interactive Patient Education  2017 [place] [employer].    Anemia Anemia is a condition in which you do not have enough red blood cells or hemoglobin. Hemoglobin is a substance in red blood cells that carries oxygen. When you do not have enough red blood cells or hemoglobin (are anemic), your body cannot get enough oxygen and your organs may not work properly. As a result, you may feel very tired or have other problems. What are the causes? Common causes of anemia include:  Excessive bleeding. Anemia can be caused by excessive bleeding inside or outside the body, including bleeding from the intestine or from periods in women.  Poor nutrition.  Long-lasting (chronic) kidney, thyroid, and liver disease.  Bone marrow disorders.  Cancer and treatments for cancer.  HIV (human immunodeficiency virus) and AIDS (acquired immunodeficiency syndrome).  Treatments for HIV and AIDS.  Spleen problems.  Blood disorders.  Infections, medicines, and autoimmune disorders that destroy red blood cells.  What are the signs or symptoms? Symptoms of this condition include:  Minor weakness.  Dizziness.  Headache.  Feeling heartbeats that are irregular or faster than normal (palpitations).  Shortness of breath, especially with exercise.  Paleness.  Cold sensitivity.  Indigestion.  Nausea.  Difficulty sleeping.  Difficulty concentrating.  Symptoms may occur suddenly or develop slowly. If your anemia is mild, you may not have symptoms. How is  this diagnosed? This condition is diagnosed based on:  Blood tests.  Your medical history.  A physical exam.  Bone marrow biopsy.  Your health care provider may also check your stool (feces) for blood and may do additional testing to look for the cause of your bleeding. You may also have other tests, including:  Imaging tests, such as a CT scan or MRI.  Endoscopy.  Colonoscopy.  How is this treated? Treatment for this condition depends on the cause. If you continue to lose a lot of blood, you may need to be treated at a hospital. Treatment may include:  Taking supplements of iron, vitamin C05, or folic acid.  Taking a hormone medicine (erythropoietin) that can help to stimulate red blood cell growth.  Having a blood transfusion. This may be needed if you lose a lot of blood.  Making changes to your diet.  Having surgery to remove your spleen.  Follow these instructions at home:  Take over-the-counter and prescription medicines only as told by your health care provider.  Take supplements only as told by your health care provider.  Follow any diet instructions that you were given.  Keep all follow-up visits as told by your health care provider. This is important. Contact a health care provider if:  You develop new bleeding anywhere in the body. Get help right away if:  You are very weak.  You are short of breath.  You have pain in your abdomen or chest.  You are dizzy or feel faint.  You have trouble concentrating.  You have bloody or black, tarry stools.  You vomit repeatedly or you vomit up blood. Summary  Anemia is a condition in which you do not have enough red blood cells or enough of a substance in your red blood cells that carries oxygen (hemoglobin).  Symptoms may occur suddenly or develop slowly.  If your anemia is mild, you may not have symptoms.  This condition is diagnosed with blood tests as well as a medical history and physical exam.  Other tests may be needed.  Treatment for this condition depends on the cause of the anemia. This information is not intended to replace advice given to you by your health care provider. Make sure you discuss any questions you have with your health care provider. Document Released: 04/15/2004 Document Revised: 04/09/2016 Document Reviewed: 04/09/2016 Elsevier Interactive Patient Education  [employer].

## 2017-07-15 ENCOUNTER — Telehealth: Payer: Self-pay | Admitting: Adult Health

## 2017-07-15 ENCOUNTER — Telehealth: Payer: Self-pay

## 2017-07-15 ENCOUNTER — Other Ambulatory Visit: Payer: Self-pay

## 2017-07-15 ENCOUNTER — Encounter (HOSPITAL_COMMUNITY): Payer: BC Managed Care – PPO

## 2017-07-15 ENCOUNTER — Telehealth: Payer: Self-pay | Admitting: *Deleted

## 2017-07-15 DIAGNOSIS — T451X5A Adverse effect of antineoplastic and immunosuppressive drugs, initial encounter: Secondary | ICD-10-CM

## 2017-07-15 DIAGNOSIS — C50511 Malignant neoplasm of lower-outer quadrant of right female breast: Secondary | ICD-10-CM

## 2017-07-15 DIAGNOSIS — D701 Agranulocytosis secondary to cancer chemotherapy: Secondary | ICD-10-CM

## 2017-07-15 DIAGNOSIS — Z171 Estrogen receptor negative status [ER-]: Secondary | ICD-10-CM

## 2017-07-15 LAB — BPAM RBC
Blood Product Expiration Date: 201905242359
Blood Product Expiration Date: 201905302359
ISSUE DATE / TIME: 201904250915
ISSUE DATE / TIME: 201904250915
Unit Type and Rh: 7300
Unit Type and Rh: 7300

## 2017-07-15 LAB — TYPE AND SCREEN
ABO/RH(D): B POS
Antibody Screen: NEGATIVE
Unit division: 0
Unit division: 0

## 2017-07-15 MED ORDER — MAGIC MOUTHWASH W/LIDOCAINE: 5.0000 mL | Freq: Three times a day (TID) | ORAL | 0 refills | Status: DC | PRN

## 2017-07-15 NOTE — Telephone Encounter (Signed)
Pt left VM for refill of her magic mouthwash.  Called pt back and she reports she has a sore throat and sore gums for day and a half.  Denies fever, open sores, or cough.  Terance Hart NP looked in her mouth at recent visit and pt didn't see any need to see symptom management at this time.  She will call us if symptoms worsen.  Notified Wilber Bihari NP and she said best to call refill for the magic mouth wash refill to her pharmacy- done so at this time to [facility] on [address].  No other needs per pt at this time.

## 2017-07-15 NOTE — Telephone Encounter (Signed)
Per 4/25 no los 

## 2017-07-15 NOTE — Telephone Encounter (Signed)
Call received from [name] "Is Dr. Virgie Dad nurse available?"  Declines need for triage.  "This is something I can leave a message.  Call transferred to [phone] for assistance and further communication.

## 2017-07-18 ENCOUNTER — Other Ambulatory Visit: Payer: Self-pay | Admitting: *Deleted

## 2017-07-18 DIAGNOSIS — Z171 Estrogen receptor negative status [ER-]: Secondary | ICD-10-CM

## 2017-07-18 DIAGNOSIS — C50511 Malignant neoplasm of lower-outer quadrant of right female breast: Secondary | ICD-10-CM

## 2017-07-19 ENCOUNTER — Inpatient Hospital Stay: Payer: BC Managed Care – PPO

## 2017-07-19 ENCOUNTER — Encounter: Payer: Self-pay | Admitting: *Deleted

## 2017-07-19 ENCOUNTER — Inpatient Hospital Stay (HOSPITAL_BASED_OUTPATIENT_CLINIC_OR_DEPARTMENT_OTHER): Payer: BC Managed Care – PPO | Admitting: Adult Health

## 2017-07-19 ENCOUNTER — Encounter: Payer: Self-pay | Admitting: Adult Health

## 2017-07-19 VITALS — BP 136/62 | HR 77 | Temp 98.8°F | Resp 18 | Ht 63.5 in | Wt 186.5 lb

## 2017-07-19 DIAGNOSIS — C50511 Malignant neoplasm of lower-outer quadrant of right female breast: Secondary | ICD-10-CM

## 2017-07-19 DIAGNOSIS — G629 Polyneuropathy, unspecified: Secondary | ICD-10-CM | POA: Diagnosis not present

## 2017-07-19 DIAGNOSIS — C773 Secondary and unspecified malignant neoplasm of axilla and upper limb lymph nodes: Secondary | ICD-10-CM | POA: Diagnosis not present

## 2017-07-19 DIAGNOSIS — C50411 Malignant neoplasm of upper-outer quadrant of right female breast: Secondary | ICD-10-CM

## 2017-07-19 DIAGNOSIS — Z171 Estrogen receptor negative status [ER-]: Secondary | ICD-10-CM | POA: Diagnosis not present

## 2017-07-19 DIAGNOSIS — Z95828 Presence of other vascular implants and grafts: Secondary | ICD-10-CM

## 2017-07-19 LAB — CBC WITH DIFFERENTIAL (CANCER CENTER ONLY)
Basophils Absolute: 0 10*3/uL (ref 0.0–0.1)
Basophils Relative: 0 %
Eosinophils Absolute: 0 10*3/uL (ref 0.0–0.5)
Eosinophils Relative: 0 %
HCT: 30.1 % — ABNORMAL LOW (ref 34.8–46.6)
Hemoglobin: 9.8 g/dL — ABNORMAL LOW (ref 11.6–15.9)
Lymphocytes Relative: 5 %
Lymphs Abs: 0.4 10*3/uL — ABNORMAL LOW (ref 0.9–3.3)
MCH: 25.7 pg (ref 25.1–34.0)
MCHC: 32.4 g/dL (ref 31.5–36.0)
MCV: 79.6 fL (ref 79.5–101.0)
Monocytes Absolute: 1.9 10*3/uL — ABNORMAL HIGH (ref 0.1–0.9)
Monocytes Relative: 23 %
Neutro Abs: 6 10*3/uL (ref 1.5–6.5)
Neutrophils Relative %: 72 %
Platelet Count: 101 10*3/uL — ABNORMAL LOW (ref 145–400)
RBC: 3.79 MIL/uL (ref 3.70–5.45)
RDW: 17.3 % — ABNORMAL HIGH (ref 11.2–14.5)
WBC Count: 8.4 10*3/uL (ref 3.9–10.3)

## 2017-07-19 LAB — CMP (CANCER CENTER ONLY)
ALT: 9 U/L (ref 0–55)
AST: 15 U/L (ref 5–34)
Albumin: 3.6 g/dL (ref 3.5–5.0)
Alkaline Phosphatase: 82 U/L (ref 40–150)
Anion gap: 7 (ref 3–11)
BUN: 15 mg/dL (ref 7–26)
CO2: 27 mmol/L (ref 22–29)
Calcium: 9.3 mg/dL (ref 8.4–10.4)
Chloride: 108 mmol/L (ref 98–109)
Creatinine: 1.08 mg/dL (ref 0.60–1.10)
GFR, Est AFR Am: >60 mL/min (ref >60)
GFR, Estimated: 53 mL/min — ABNORMAL LOW (ref >60)
Glucose, Bld: 100 mg/dL (ref 70–140)
Potassium: 4 mmol/L (ref 3.5–5.1)
Sodium: 142 mmol/L (ref 136–145)
Total Bilirubin: 0.2 mg/dL (ref 0.2–1.2)
Total Protein: 6.7 g/dL (ref 6.4–8.3)

## 2017-07-19 MED ORDER — SODIUM CHLORIDE 0.9% FLUSH
10.0000 mL | INTRAVENOUS | Status: DC | PRN
  Administered 2017-07-19: 10 mL via INTRAVENOUS
  Filled 2017-07-19: qty 10

## 2017-07-19 NOTE — Patient Instructions (Signed)
Implanted Port Home Guide An implanted port is a type of central line that is placed under the skin. Central lines are used to provide IV access when treatment or nutrition needs to be given through a person's veins. Implanted ports are used for long-term IV access. An implanted port may be placed because:  You need IV medicine that would be irritating to the small veins in your hands or arms.  You need long-term IV medicines, such as antibiotics.  You need IV nutrition for a long period.  You need frequent blood draws for lab tests.  You need dialysis.  Implanted ports are usually placed in the chest area, but they can also be placed in the upper arm, the abdomen, or the leg. An implanted port has two main parts:  Reservoir. The reservoir is round and will appear as a small, raised area under your skin. The reservoir is the part where a needle is inserted to give medicines or draw blood.  Catheter. The catheter is a thin, flexible tube that extends from the reservoir. The catheter is placed into a large vein. Medicine that is inserted into the reservoir goes into the catheter and then into the vein.  How will I care for my incision site? Do not get the incision site wet. Bathe or shower as directed by your health care provider. How is my port accessed? Special steps must be taken to access the port:  Before the port is accessed, a numbing cream can be placed on the skin. This helps numb the skin over the port site.  Your health care provider uses a sterile technique to access the port. ? Your health care provider must put on a mask and sterile gloves. ? The skin over your port is cleaned carefully with an antiseptic and allowed to dry. ? The port is gently pinched between sterile gloves, and a needle is inserted into the port.  Only "non-coring" port needles should be used to access the port. Once the port is accessed, a blood return should be checked. This helps ensure that the port  is in the vein and is not clogged.  If your port needs to remain accessed for a constant infusion, a clear (transparent) bandage will be placed over the needle site. The bandage and needle will need to be changed every week, or as directed by your health care provider.  Keep the bandage covering the needle clean and dry. Do not get it wet. Follow your health care provider's instructions on how to take a shower or bath while the port is accessed.  If your port does not need to stay accessed, no bandage is needed over the port.  What is flushing? Flushing helps keep the port from getting clogged. Follow your health care provider's instructions on how and when to flush the port. Ports are usually flushed with saline solution or a medicine called heparin. The need for flushing will depend on how the port is used.  If the port is used for intermittent medicines or blood draws, the port will need to be flushed: ? After medicines have been given. ? After blood has been drawn. ? As part of routine maintenance.  If a constant infusion is running, the port may not need to be flushed.  How long will my port stay implanted? The port can stay in for as long as your health care provider thinks it is needed. When it is time for the port to come out, surgery will be   done to remove it. The procedure is similar to the one performed when the port was put in. When should I seek immediate medical care? When you have an implanted port, you should seek immediate medical care if:  You notice a bad smell coming from the incision site.  You have swelling, redness, or drainage at the incision site.  You have more swelling or pain at the port site or the surrounding area.  You have a fever that is not controlled with medicine.  This information is not intended to replace advice given to you by your health care provider. Make sure you discuss any questions you have with your health care provider. Document  Released: 03/08/2005 Document Revised: 08/14/2015 Document Reviewed: 11/13/2012 Elsevier Interactive Patient Education  2017 [employer]  

## 2017-07-19 NOTE — Progress Notes (Addendum)
[facility]  [phone] [phone]     ID: [name] DOB: [birth year]  MR#: [id number]  JQB#:[id number]  Patient Care Team: Shirline Frees, MD as PCP - General (Family Medicine) Magrinat, Virgie Dad, MD as Consulting Physician (Oncology) Rolm Bookbinder, MD as Consulting Physician (General Surgery) Gery Pray, MD as Consulting Physician (Radiation Oncology) Allyn Kenner, MD (Dermatology) Thurnell Lose, MD as Consulting Physician (Obstetrics and Gynecology) Marchia Bond, MD as Consulting Physician (Orthopedic Surgery) Christene Slates, MD as Physician Assistant (Radiology) OTHER MD:  CHIEF COMPLAINT: Triple negative breast cancer  CURRENT TREATMENT: Neoadjuvant chemotherapy   INTERVAL HISTORY: [name] returns today for follow up and treatment of her triple negative breast cancer accompanied by her daughter. She continues on neoadjuvant doxorubicin and cyclophosphamide dose dense--even every 14 days x 4, to be followed by weekly paclitaxel x12.  She is due to start Paclitaxel and Carboplatin today.   She was anemic after receiving her fourth cycle of her treatment, and has subsequently received a blood transfusion of 2 units.  Her hemoglobin has come up to 9.8 today.    REVIEW OF SYSTEMS: Matisyn is doing better today.  She continues to be fatigued, however it is better since her blood transfusion.  She is experiencing numbness in the tips and PIP joints of her third, fourth, and fifth digit of her right hand and in the tips of her toes.  She is concerned about receiving more chemotherapy that can cause neuropathy, since she already has it.  She denies any other issues today such as pain, fevers, chills, nausea, vomiting, constipation, diarrhea, or any other concerns.  A detailed ROS was otherwise non contributory.    HISTORY OF CURRENT ILLNESS: Emily Perez had routine bilateral screening mammography at [facility] on 04/19/2017 showing a  possible abnormality in the right breast. She underwent unilateral right diagnostic mammography with tomography and right breast ultrasonography at [facility] on 04/25/2017 showing: breast density category B.  In the right breast at the 6:00 radiant 2 cm from the nipple there was a 2 cm oval mass which by ultrasound measured 2.5 cm and was irregular and hypoechoic.  There were also at least 3 abnormal appearing lymph nodes in the right axilla.  Accordingly on 05/02/2017 she proceeded to biopsy of the right breast mass in question and lymph node sampling. The pathology from this procedure showed ([id number]): Invasive ductal carcinoma grade II. One lymph node positive for metastatic carcinoma (1/1). Prognostic indicators significant for: estrogen receptor, 0% negative and progesterone receptor, 0% negative. Proliferation marker Ki67 at 80%. HER2 not amplified with ratios HER2/CEP17 signals 1.29 and average copies per cell 2.25  The patient's subsequent history is as detailed below.    PAST MEDICAL HISTORY: Past Medical History:  Diagnosis Date  . Arthritis   . Family history of breast cancer   . Family history of prostate cancer   . Headache    migraines in the past  . Hypertension   . PONV (postoperative nausea and vomiting)    after 1st colonoscopy  Migraines in the past.   PAST SURGICAL HISTORY: Past Surgical History:  Procedure Laterality Date  . COLONOSCOPY    . FRACTURE SURGERY Left    wrist  . PORTACATH PLACEMENT Right 05/19/2017   Procedure: INSERTION PORT-A-CATH WITH ULTRASOUND;  Surgeon: Rolm Bookbinder, MD;  Location: [facility];  Service: General;  Laterality: Right;    Wrist Fracture. Torn Rotator cuff and torn meniscus.    FAMILY HISTORY Family History  Problem Relation Age of Onset  . Heart disease Mother   . Cancer Father   . Diabetes Father   . Hypertension Father   . Hyperlipidemia Father   . Heart disease Brother   . Hypertension Brother   . Hyperlipidemia Brother    . Diabetes Brother   . Prostate cancer Brother 35  . Breast cancer Sister 15  . Breast cancer Sister 22  The patient's father died at age 3 due to heart disease and emphysema. The patient's mother died at age 67 due to heart disease. The patient has 6 brothers and 8 sisters. She notes 2 sisters with breast cancer. The 1st sister was diagnosed at age 60, and the 2nd sister was diagnosed at age 24. She notes that both sisters are alive. She also notes a paternal first cousin with breast cancer diagnosed in her 24's. The patient notes that her father also had bone cancer, but she's unsure if he had myeloma. She denies a family history of ovarian cancer.   GYNECOLOGIC HISTORY:  No LMP recorded. Patient is postmenopausal. 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 She is divorced. She lives by herself with no pets. The patient's daughter, Emily Perez, works in Therapist, art for [employer]. The patient's son, Emily Perez,  works for [employer]. The patient has  2 grandchildren. She belongs to [facility] [employer].      ADVANCED DIRECTIVES: Not in place.  At the 05/19/2017 visit the patient was given the appropriate documents to complete and notarized at her discretion   HEALTH MAINTENANCE: Social History   Tobacco Use  . Smoking status: Never Smoker  . Smokeless tobacco: Never Used  Substance Use Topics  . Alcohol use: No  . Drug use: No     Colonoscopy:   PAP: September 2018 normal  Bone density: none   Allergies  Allergen Reactions  . Latex Hives and Rash    Current Outpatient Medications  Medication Sig Dispense Refill  . acetaminophen (TYLENOL) 160 MG/5ML elixir Take 15 mg/kg by mouth every 4 (four) hours as needed for fever.    Marland Kitchen aspirin 81 MG tablet Take 81 mg by mouth daily.    . ciprofloxacin  (CIPRO) 500 MG tablet Take 1 tablet (500 mg total) by mouth 2 (two) times daily. 10 tablet 0  . dexamethasone (DECADRON) 4 MG tablet Take 2 tablets by mouth once a day on the day after chemotherapy and then take 2 tablets two times a day for 2 days. Take with food. (Patient not taking: Reported on 06/28/2017) 30 tablet 1  . diphenhydramine-acetaminophen (TYLENOL PM) 25-500 MG TABS tablet Take 1 tablet by mouth at bedtime as needed.    . famotidine (PEPCID) 20 MG tablet Take 1 tablet (20 mg total) by mouth 2 (two) times daily. 60 tablet 3  . fluconazole (DIFLUCAN) 200 MG tablet Take 1 tablet (200 mg total) by mouth daily. (Patient not taking: Reported on 06/28/2017) 3 tablet 5  . fluocinonide cream (LIDEX) 8.50 % Apply 1 application topically 2 (two) times daily as needed (rash).    . halobetasol (ULTRAVATE) 0.05 % cream Apply 1 application topically 2 (two) times daily as needed (rash).    Marland Kitchen lidocaine-prilocaine (EMLA) cream Apply to affected area once (Patient not taking: Reported on 06/28/2017) 30 g 3  .  LORazepam (ATIVAN) 0.5 MG tablet Take 1 tablet (0.5 mg total) by mouth at bedtime as needed (Nausea or vomiting). (Patient not taking: Reported on 06/28/2017) 30 tablet 0  . magic mouthwash w/lidocaine SOLN Take 5 mLs by mouth 3 (three) times daily as needed for mouth pain. 300 mL 0  . meclizine (ANTIVERT) 12.5 MG tablet Take 6.25 mg by mouth 3 (three) times daily as needed for dizziness.    . naproxen sodium (ALEVE) 220 MG tablet Take 110-220 mg by mouth daily as needed (pain). Takes 0.5-1 tablet as needed for pain    . neomycin-polymyxin-hydrocortisone (CORTISPORIN) 3.5-10000-1 OTIC suspension   0  . ondansetron (ZOFRAN-ODT) 8 MG disintegrating tablet Take 8 mg by mouth every 8 (eight) hours as needed for nausea or vomiting.    Marland Kitchen OVER THE COUNTER MEDICATION Apply 1 application topically at bedtime. Pain Relief Balm    . prochlorperazine (COMPAZINE) 10 MG tablet Take 1 tablet (10 mg total) by mouth every 6  (six) hours as needed (Nausea or vomiting). (Patient not taking: Reported on 06/28/2017) 30 tablet 1  . triamterene-hydrochlorothiazide (DYAZIDE) 37.5-25 MG capsule Take 1 capsule by mouth daily.  3  . vitamin B-12 (CYANOCOBALAMIN) 1000 MCG tablet Take 500 mcg by mouth daily. Takes 0.5 tablet     No current facility-administered medications for this visit.     OBJECTIVE:   Vitals:   07/19/17 0914  BP: 136/62  Pulse: 77  Resp: 18  Temp: 98.8 F (37.1 C)  SpO2: 100%     Body mass index is 32.52 kg/m.   Wt Readings from Last 3 Encounters:  07/19/17 186 lb 8 oz (84.6 kg)  07/12/17 182 lb 6.4 oz (82.7 kg)  07/05/17 188 lb 12.8 oz (85.6 kg)  ECOG FS:2 - Symptomatic, <50% confined to bed GENERAL: Patient is a tired appearing female in no acute distress HEENT:  Sclerae anicteric.  Oropharynx clear and moist. No ulcerations or evidence of oropharyngeal candidiasis. Neck is supple.  NODES:  No cervical, supraclavicular, or axillary lymphadenopathy palpated.  BREAST EXAM:  Deferred. LUNGS:  Clear to auscultation bilaterally.  No wheezes or rhonchi. HEART:  Regular rate and rhythm. No murmur appreciated. ABDOMEN:  Soft, nontender.  Positive, normoactive bowel sounds. No organomegaly palpated. MSK:  No focal spinal tenderness to palpation. Full range of motion bilaterally in the upper extremities. EXTREMITIES:  No peripheral edema.   SKIN:  Clear with no obvious rashes or skin changes. No nail dyscrasia. NEURO:  Nonfocal. Well oriented.  Appropriate affect.    LAB RESULTS:  CMP     Component Value Date/Time   NA 142 07/05/2017 0912   K 3.6 07/05/2017 0912   CL 107 07/05/2017 0912   CO2 26 07/05/2017 0912   GLUCOSE 99 07/05/2017 0912   BUN 11 07/05/2017 0912   CREATININE 1.08 07/05/2017 0912   CALCIUM 9.4 07/05/2017 0912   PROT 6.8 07/05/2017 0912   ALBUMIN 3.5 07/05/2017 0912   AST 13 07/05/2017 0912   ALT 7 07/05/2017 0912   ALKPHOS 90 07/05/2017 0912   BILITOT <0.2 (L)  07/05/2017 0912   GFRNONAA 53 (L) 07/05/2017 0912   GFRAA >60 07/05/2017 0912    No results found for: TOTALPROTELP, ALBUMINELP, A1GS, A2GS, BETS, BETA2SER, GAMS, MSPIKE, SPEI  No results found for: KPAFRELGTCHN, LAMBDASER, KAPLAMBRATIO  Lab Results  Component Value Date   WBC 8.4 07/19/2017   NEUTROABS 6.0 07/19/2017   HGB 9.8 (L) 07/19/2017   HCT 30.1 (L) 07/19/2017  MCV 79.6 07/19/2017   PLT 101 (L) 07/19/2017    @LASTCHEMISTRY @  No results found for: LABCA2  No components found for: TMHDQQ229  No results for input(s): INR in the last 168 hours.  No results found for: LABCA2  No results found for: NLG921  No results found for: JHE174  No results found for: YCX448  No results found for: CA2729  No components found for: HGQUANT  No results found for: CEA1 / No results found for: CEA1   No results found for: AFPTUMOR  No results found for: CHROMOGRNA  No results found for: PSA1  Appointment on 07/19/2017  Component Date Value Ref Range Status  . WBC Count 07/19/2017 8.4  3.9 - 10.3 K/uL Final  . RBC 07/19/2017 3.79  3.70 - 5.45 MIL/uL Final  . Hemoglobin 07/19/2017 9.8* 11.6 - 15.9 g/dL Final  . HCT 07/19/2017 30.1* 34.8 - 46.6 % Final  . MCV 07/19/2017 79.6  79.5 - 101.0 fL Final  . MCH 07/19/2017 25.7  25.1 - 34.0 pg Final  . MCHC 07/19/2017 32.4  31.5 - 36.0 g/dL Final  . RDW 07/19/2017 17.3* 11.2 - 14.5 % Final  . Platelet Count 07/19/2017 101* 145 - 400 K/uL Final  . Neutrophils Relative % 07/19/2017 72  % Final  . Neutro Abs 07/19/2017 6.0  1.5 - 6.5 K/uL Final  . Lymphocytes Relative 07/19/2017 5  % Final  . Lymphs Abs 07/19/2017 0.4* 0.9 - 3.3 K/uL Final  . Monocytes Relative 07/19/2017 23  % Final  . Monocytes Absolute 07/19/2017 1.9* 0.1 - 0.9 K/uL Final  . Eosinophils Relative 07/19/2017 0  % Final  . Eosinophils Absolute 07/19/2017 0.0  0.0 - 0.5 K/uL Final  . Basophils Relative 07/19/2017 0  % Final  . Basophils Absolute 07/19/2017 0.0   0.0 - 0.1 K/uL Final   Performed at [facility], [place] [address], [place], [place] [address]    (this displays the last labs from the last 3 days)  No results found for: TOTALPROTELP, ALBUMINELP, A1GS, A2GS, BETS, BETA2SER, GAMS, MSPIKE, SPEI (this displays SPEP labs)  No results found for: KPAFRELGTCHN, LAMBDASER, KAPLAMBRATIO (kappa/lambda light chains)  No results found for: HGBA, HGBA2QUANT, HGBFQUANT, HGBSQUAN (Hemoglobinopathy evaluation)   No results found for: LDH  Lab Results  Component Value Date   IRON 170 (H) 07/12/2017   TIBC 303 07/12/2017   IRONPCTSAT 56 07/12/2017   (Iron and TIBC)  Lab Results  Component Value Date   FERRITIN 685 (H) 07/12/2017    Urinalysis No results found for: COLORURINE, APPEARANCEUR, LABSPEC, PHURINE, GLUCOSEU, HGBUR, BILIRUBINUR, KETONESUR, PROTEINUR, UROBILINOGEN, NITRITE, LEUKOCYTESUR   STUDIES: No results found.  ELIGIBLE FOR AVAILABLE RESEARCH PROTOCOL: [facility] [phone]; not a candidate for breast MRI study given history of claustrophobia  ASSESSMENT: 62 y.o. [place] Woman status post right breast upper outer quadrant biopsy 05/02/2017 for a clinical T2 N1-2, stage IIIB invasive ductal carcinoma, grade 2-3, triple negative, with an MIB-180%  (a) breast MRI 05/18/2017 shows T3 N1 disease with possible involvement of the internal mammary nodes  (1) neoadjuvant chemotherapy to consist of doxorubicin and cyclophosphamide in dose dense fashion x4 starting 05/24/2017 to be  followed by carboplatin and paclitaxel weekly x12  (2) definitive surgery to follow  (3) consider SWOG [id number] depending on final pathology results  (4) adjuvant radiation to follow  (5) genetics testing 06/22/2017, results pending  PLAN:  Codie is doing moderately well today.  She met with Dr. Jana Hakim to  discuss her numbness in her fingertips and how she is feeling and has tolerated her first four cycles of Doxorubicin and  Cyclophosphamide.  After the discussion, it was decided that she will not receive any further chemotherapy.  I will order her breast MRI and she will f/u with Dr. Jana Hakim, 2 days later, and Dr. Donne Hazel, her surgeon afterward too.  I reviewed this plan with her navigator, Bary Castilla RN who will be her point of contact during this transition period and helping [place] operationalize it smoothly.    Vinnie knows to call for any questions or concerns prior to her next appointment with [place].    Wilber Bihari, NP  07/19/17 9:50 AM Medical Oncology and Hematology [facility] [address] [place], [place] [address] Tel. [phone]    Fax. [phone]   ADDENDUM: Ammarie's complaints are not exactly classic for neuropathy but they are certainly close enough that we cannot give her any further taxane treatment at this point, especially since the symptoms have not improved by holding therapy temporarily.  Accordingly she is done with chemotherapy for now.  She is ready to proceed to surgery.  Depending on the final surgical results she may be a candidate for capecitabine, but certainly no further chemotherapy IV.  Accordingly her port may be removed at the time of surgery.  I personally saw this patient and performed a substantive portion of this encounter with the listed APP documented above.   Chauncey Cruel, MD Medical Oncology and Hematology [facility] [address] [place],  [address] Tel. [phone]    Fax. [phone]

## 2017-07-20 ENCOUNTER — Telehealth: Payer: Self-pay | Admitting: Adult Health

## 2017-07-20 NOTE — Telephone Encounter (Signed)
Per 4/30 no los 

## 2017-07-21 ENCOUNTER — Ambulatory Visit: Payer: BC Managed Care – PPO

## 2017-07-24 ENCOUNTER — Ambulatory Visit
Admission: RE | Admit: 2017-07-24 | Discharge: 2017-07-24 | Disposition: A | Discharge status: Home / Self Care | Payer: BC Managed Care – PPO | Source: Ambulatory Visit | Attending: Adult Health | Admitting: Adult Health

## 2017-07-24 DIAGNOSIS — Z171 Estrogen receptor negative status [ER-]: Secondary | ICD-10-CM

## 2017-07-24 DIAGNOSIS — C50511 Malignant neoplasm of lower-outer quadrant of right female breast: Secondary | ICD-10-CM

## 2017-07-24 MED ORDER — GADOBENATE DIMEGLUMINE 529 MG/ML IV SOLN
17.0000 mL | Freq: Once | INTRAVENOUS | Status: AC | PRN
  Administered 2017-07-24: 17 mL via INTRAVENOUS

## 2017-07-25 ENCOUNTER — Other Ambulatory Visit: Payer: Self-pay | Admitting: Oncology

## 2017-07-25 NOTE — Progress Notes (Unsigned)
[place]  [phone] [phone]     ID: Emily Perez DOB: 06/13/1955  MR#: [id number]  MCN#:[id number]  Patient Care Team: Shirline Frees, MD as PCP - General (Family Medicine) Amdrew Oboyle, Virgie Dad, MD as Consulting Physician (Oncology) Rolm Bookbinder, MD as Consulting Physician (General Surgery) Gery Pray, MD as Consulting Physician (Radiation Oncology) Allyn Kenner, MD (Dermatology) Thurnell Lose, MD as Consulting Physician (Obstetrics and Gynecology) Marchia Bond, MD as Consulting Physician (Orthopedic Surgery) Christene Slates, MD as Physician Assistant (Radiology) OTHER MD:  CHIEF COMPLAINT: Triple negative breast cancer  CURRENT TREATMENT: Neoadjuvant chemotherapy   INTERVAL HISTORY: Emily Perez returns today for follow up and treatment of her triple negative breast cancer accompanied by her daughter. She continues on neoadjuvant doxorubicin and cyclophosphamide dose dense--even every 14 days x 4, to be followed by weekly paclitaxel x12.  She is due to start Paclitaxel and Carboplatin today.   She was anemic after receiving her fourth cycle of her treatment, and has subsequently received a blood transfusion of 2 units.  Her hemoglobin has come up to 9.8 today.    REVIEW OF SYSTEMS: Lashaya is doing better today.  She continues to be fatigued, however it is better since her blood transfusion.  She is experiencing numbness in the tips and PIP joints of her third, fourth, and fifth digit of her right hand and in the tips of her toes.  She is concerned about receiving more chemotherapy that can cause neuropathy, since she already has it.  She denies any other issues today such as pain, fevers, chills, nausea, vomiting, constipation, diarrhea, or any other concerns.  A detailed ROS was otherwise non contributory.    HISTORY OF CURRENT ILLNESS: Emily Perez had routine bilateral screening mammography at [facility] on 04/19/2017 showing a  possible abnormality in the right breast. She underwent unilateral right diagnostic mammography with tomography and right breast ultrasonography at [facility] on 04/25/2017 showing: breast density category B.  In the right breast at the 6:00 radiant 2 cm from the nipple there was a 2 cm oval mass which by ultrasound measured 2.5 cm and was irregular and hypoechoic.  There were also at least 3 abnormal appearing lymph nodes in the right axilla.  Accordingly on 05/02/2017 she proceeded to biopsy of the right breast mass in question and lymph node sampling. The pathology from this procedure showed ([id number]): Invasive ductal carcinoma grade II. One lymph node positive for metastatic carcinoma (1/1). Prognostic indicators significant for: estrogen receptor, 0% negative and progesterone receptor, 0% negative. Proliferation marker Ki67 at 80%. HER2 not amplified with ratios HER2/CEP17 signals 1.29 and average copies per cell 2.25  The patient's subsequent history is as detailed below.    PAST MEDICAL HISTORY: Past Medical History:  Diagnosis Date  . Arthritis   . Family history of breast cancer   . Family history of prostate cancer   . Headache    migraines in the past  . Hypertension   . PONV (postoperative nausea and vomiting)    after 1st colonoscopy  Migraines in the past.   PAST SURGICAL HISTORY: Past Surgical History:  Procedure Laterality Date  . COLONOSCOPY    . FRACTURE SURGERY Left    wrist  . PORTACATH PLACEMENT Right 05/19/2017   Procedure: INSERTION PORT-A-CATH WITH ULTRASOUND;  Surgeon: Rolm Bookbinder, MD;  Location: [place];  Service: General;  Laterality: Right;    Wrist Fracture. Torn Rotator cuff and torn meniscus.    FAMILY HISTORY Family History  Problem Relation Age of Onset  . Heart disease Mother   . Cancer Father   . Diabetes Father   . Hypertension Father   . Hyperlipidemia Father   . Heart disease Brother   . Hypertension Brother   . Hyperlipidemia Brother    . Diabetes Brother   . Prostate cancer Brother 27  . Breast cancer Sister 59  . Breast cancer Sister 24  The patient's father died at age 64 due to heart disease and emphysema. The patient's mother died at age 64 due to heart disease. The patient has 6 brothers and 8 sisters. She notes 2 sisters with breast cancer. The 1st sister was diagnosed at age 26, and the 2nd sister was diagnosed at age 37. She notes that both sisters are alive. She also notes a paternal first cousin with breast cancer diagnosed in her 16's. The patient notes that her father also had bone cancer, but she's unsure if he had myeloma. She denies a family history of ovarian cancer.   GYNECOLOGIC HISTORY:  No LMP recorded. Patient is postmenopausal. 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 She is divorced. She lives by herself with no pets. The patient's daughter, Emily Perez, works in Therapist, art for [employer]. The patient's son, Emily Perez,  works for [employer]. The patient has  2 grandchildren. She belongs to [facility].      ADVANCED DIRECTIVES: Not in place.  At the 05/19/2017 visit the patient was given the appropriate documents to complete and notarized at her discretion   HEALTH MAINTENANCE: Social History   Tobacco Use  . Smoking status: Never Smoker  . Smokeless tobacco: Never Used  Substance Use Topics  . Alcohol use: No  . Drug use: No     Colonoscopy:   PAP: September 2018 normal  Bone density: none   Allergies  Allergen Reactions  . Latex Hives and Rash    Current Outpatient Medications  Medication Sig Dispense Refill  . acetaminophen (TYLENOL) 160 MG/5ML elixir Take 15 mg/kg by mouth every 4 (four) hours as needed for fever.    Marland Kitchen aspirin 81 MG tablet Take 81 mg by mouth daily.    . ciprofloxacin  (CIPRO) 500 MG tablet Take 1 tablet (500 mg total) by mouth 2 (two) times daily. 10 tablet 0  . dexamethasone (DECADRON) 4 MG tablet Take 2 tablets by mouth once a day on the day after chemotherapy and then take 2 tablets two times a day for 2 days. Take with food. (Patient not taking: Reported on 06/28/2017) 30 tablet 1  . diphenhydramine-acetaminophen (TYLENOL PM) 25-500 MG TABS tablet Take 1 tablet by mouth at bedtime as needed.    . famotidine (PEPCID) 20 MG tablet Take 1 tablet (20 mg total) by mouth 2 (two) times daily. 60 tablet 3  . fluconazole (DIFLUCAN) 200 MG tablet Take 1 tablet (200 mg total) by mouth daily. (Patient not taking: Reported on 06/28/2017) 3 tablet 5  . fluocinonide cream (LIDEX) 4.91 % Apply 1 application topically 2 (two) times daily as needed (rash).    . halobetasol (ULTRAVATE) 0.05 % cream Apply 1 application topically 2 (two) times daily as needed (rash).    Marland Kitchen lidocaine-prilocaine (EMLA) cream Apply to affected area once (Patient not taking: Reported on 06/28/2017) 30 g 3  .  LORazepam (ATIVAN) 0.5 MG tablet Take 1 tablet (0.5 mg total) by mouth at bedtime as needed (Nausea or vomiting). (Patient not taking: Reported on 06/28/2017) 30 tablet 0  . magic mouthwash w/lidocaine SOLN Take 5 mLs by mouth 3 (three) times daily as needed for mouth pain. 300 mL 0  . meclizine (ANTIVERT) 12.5 MG tablet Take 6.25 mg by mouth 3 (three) times daily as needed for dizziness.    . naproxen sodium (ALEVE) 220 MG tablet Take 110-220 mg by mouth daily as needed (pain). Takes 0.5-1 tablet as needed for pain    . neomycin-polymyxin-hydrocortisone (CORTISPORIN) 3.5-10000-1 OTIC suspension   0  . ondansetron (ZOFRAN-ODT) 8 MG disintegrating tablet Take 8 mg by mouth every 8 (eight) hours as needed for nausea or vomiting.    Marland Kitchen OVER THE COUNTER MEDICATION Apply 1 application topically at bedtime. Pain Relief Balm    . prochlorperazine (COMPAZINE) 10 MG tablet Take 1 tablet (10 mg total) by mouth every 6  (six) hours as needed (Nausea or vomiting). (Patient not taking: Reported on 06/28/2017) 30 tablet 1  . triamterene-hydrochlorothiazide (DYAZIDE) 37.5-25 MG capsule Take 1 capsule by mouth daily.  3  . vitamin B-12 (CYANOCOBALAMIN) 1000 MCG tablet Take 500 mcg by mouth daily. Takes 0.5 tablet     No current facility-administered medications for this visit.     OBJECTIVE:   There were no vitals filed for this visit.   There is no height or weight on file to calculate BMI.   Wt Readings from Last 3 Encounters:  07/19/17 186 lb 8 oz (84.6 kg)  07/12/17 182 lb 6.4 oz (82.7 kg)  07/05/17 188 lb 12.8 oz (85.6 kg)  ECOG FS:2 - Symptomatic, <50% confined to bed GENERAL: Patient is a tired appearing female in no acute distress HEENT:  Sclerae anicteric.  Oropharynx clear and moist. No ulcerations or evidence of oropharyngeal candidiasis. Neck is supple.  NODES:  No cervical, supraclavicular, or axillary lymphadenopathy palpated.  BREAST EXAM:  Deferred. LUNGS:  Clear to auscultation bilaterally.  No wheezes or rhonchi. HEART:  Regular rate and rhythm. No murmur appreciated. ABDOMEN:  Soft, nontender.  Positive, normoactive bowel sounds. No organomegaly palpated. MSK:  No focal spinal tenderness to palpation. Full range of motion bilaterally in the upper extremities. EXTREMITIES:  No peripheral edema.   SKIN:  Clear with no obvious rashes or skin changes. No nail dyscrasia. NEURO:  Nonfocal. Well oriented.  Appropriate affect.    LAB RESULTS:  CMP     Component Value Date/Time   NA 142 07/19/2017 0905   K 4.0 07/19/2017 0905   CL 108 07/19/2017 0905   CO2 27 07/19/2017 0905   GLUCOSE 100 07/19/2017 0905   BUN 15 07/19/2017 0905   CREATININE 1.08 07/19/2017 0905   CALCIUM 9.3 07/19/2017 0905   PROT 6.7 07/19/2017 0905   ALBUMIN 3.6 07/19/2017 0905   AST 15 07/19/2017 0905   ALT 9 07/19/2017 0905   ALKPHOS 82 07/19/2017 0905   BILITOT 0.2 07/19/2017 0905   GFRNONAA 53 (L) 07/19/2017  0905   GFRAA >60 07/19/2017 0905    No results found for: TOTALPROTELP, ALBUMINELP, A1GS, A2GS, BETS, BETA2SER, GAMS, MSPIKE, SPEI  No results found for: KPAFRELGTCHN, LAMBDASER, KAPLAMBRATIO  Lab Results  Component Value Date   WBC 8.4 07/19/2017   NEUTROABS 6.0 07/19/2017   HGB 9.8 (L) 07/19/2017   HCT 30.1 (L) 07/19/2017   MCV 79.6 07/19/2017   PLT 101 (L) 07/19/2017    @LASTCHEMISTRY @  No results found for: LABCA2  No components found for: ONGEXB284  No results for input(s): INR in the last 168 hours.  No results found for: LABCA2  No results found for: XLK440  No results found for: NUU725  No results found for: DGU440  No results found for: CA2729  No components found for: HGQUANT  No results found for: CEA1 / No results found for: CEA1   No results found for: AFPTUMOR  No results found for: CHROMOGRNA  No results found for: PSA1  No visits with results within 3 Day(s) from this visit.  Latest known visit with results is:  Appointment on 07/19/2017  Component Date Value Ref Range Status  . WBC Count 07/19/2017 8.4  3.9 - 10.3 K/uL Final  . RBC 07/19/2017 3.79  3.70 - 5.45 MIL/uL Final  . Hemoglobin 07/19/2017 9.8* 11.6 - 15.9 g/dL Final  . HCT 07/19/2017 30.1* 34.8 - 46.6 % Final  . MCV 07/19/2017 79.6  79.5 - 101.0 fL Final  . MCH 07/19/2017 25.7  25.1 - 34.0 pg Final  . MCHC 07/19/2017 32.4  31.5 - 36.0 g/dL Final  . RDW 07/19/2017 17.3* 11.2 - 14.5 % Final  . Platelet Count 07/19/2017 101* 145 - 400 K/uL Final  . Neutrophils Relative % 07/19/2017 72  % Final  . Neutro Abs 07/19/2017 6.0  1.5 - 6.5 K/uL Final  . Lymphocytes Relative 07/19/2017 5  % Final  . Lymphs Abs 07/19/2017 0.4* 0.9 - 3.3 K/uL Final  . Monocytes Relative 07/19/2017 23  % Final  . Monocytes Absolute 07/19/2017 1.9* 0.1 - 0.9 K/uL Final  . Eosinophils Relative 07/19/2017 0  % Final  . Eosinophils Absolute 07/19/2017 0.0  0.0 - 0.5 K/uL Final  . Basophils Relative 07/19/2017  0  % Final  . Basophils Absolute 07/19/2017 0.0  0.0 - 0.1 K/uL Final   Performed at [facility], [place] [address] [place], [place] [address]  . Sodium 07/19/2017 142  136 - 145 mmol/L Final  . Potassium 07/19/2017 4.0  3.5 - 5.1 mmol/L Final  . Chloride 07/19/2017 108  98 - 109 mmol/L Final  . CO2 07/19/2017 27  22 - 29 mmol/L Final  . Glucose, Bld 07/19/2017 100  70 - 140 mg/dL Final  . BUN 07/19/2017 15  7 - 26 mg/dL Final  . Creatinine 07/19/2017 1.08  0.60 - 1.10 mg/dL Final  . Calcium 07/19/2017 9.3  8.4 - 10.4 mg/dL Final  . Total Protein 07/19/2017 6.7  6.4 - 8.3 g/dL Final  . Albumin 07/19/2017 3.6  3.5 - 5.0 g/dL Final  . AST 07/19/2017 15  5 - 34 U/L Final  . ALT 07/19/2017 9  0 - 55 U/L Final  . Alkaline Phosphatase 07/19/2017 82  40 - 150 U/L Final  . Total Bilirubin 07/19/2017 0.2  0.2 - 1.2 mg/dL Final  . GFR, Est Non Af Am 07/19/2017 53* >60 mL/min Final  . GFR, Est AFR Am 07/19/2017 >60  >60 mL/min Final   Comment: (NOTE) The eGFR has been calculated using the CKD EPI equation. This calculation has not been validated in all clinical situations. eGFR's persistently <60 mL/min signify possible Chronic Kidney Disease.   [name] Hahn gap 07/19/2017 7  3 - 11 Final   Performed at [facility], [place] [address] [place], [place] [address]    (this displays the last labs from the last 3 days)  No results found for: TOTALPROTELP, ALBUMINELP, A1GS,  A2GS, BETS, BETA2SER, GAMS, MSPIKE, SPEI (this displays SPEP labs)  No results found for: KPAFRELGTCHN, LAMBDASER, KAPLAMBRATIO (kappa/lambda light chains)  No results found for: HGBA, HGBA2QUANT, HGBFQUANT, HGBSQUAN (Hemoglobinopathy evaluation)   No results found for: LDH  Lab Results  Component Value Date   IRON 170 (H) 07/12/2017   TIBC 303 07/12/2017   IRONPCTSAT 56 07/12/2017   (Iron and TIBC)  Lab Results  Component Value Date   FERRITIN 685 (H) 07/12/2017     Urinalysis No results found for: COLORURINE, APPEARANCEUR, LABSPEC, PHURINE, GLUCOSEU, HGBUR, BILIRUBINUR, KETONESUR, PROTEINUR, UROBILINOGEN, NITRITE, LEUKOCYTESUR   STUDIES: Mr Breast Bilateral W Wo Contrast Inc Cad  Result Date: 07/25/2017 CLINICAL DATA:  62 year old female with triple negative right breast cancer metastatic to the right axilla. She was initially diagnosed in January of 2019, and presents today to evaluate for response to neoadjuvant chemotherapy. She has family history of breast cancer in 2 sisters diagnosed at the ages of 68 and 8. LABS:  Labs drawn on 07/19/2017 indicate creatinine is 1.08 mg/dL and GFR is 53. EXAM: BILATERAL BREAST MRI WITH AND WITHOUT CONTRAST TECHNIQUE: Multiplanar, multisequence MR images of both breasts were obtained prior to and following the intravenous administration of ml of 17 THREE-DIMENSIONAL MR IMAGE RENDERING ON INDEPENDENT WORKSTATION: Three-dimensional MR images were rendered by post-processing of the original MR data on an independent workstation. The three-dimensional MR images were interpreted, and findings are reported in the following complete MRI report for this study. Three dimensional images were evaluated at the independent DynaCad workstation COMPARISON:  Previous exam(s). FINDINGS: Breast composition: b. Scattered fibroglandular tissue. Background parenchymal enhancement: Minimal. Right breast: There is complete resolution of the abnormal enhancement in the right breast, previously measuring up to 6.8 cm. A port has been placed on the upper inner quadrant of the right breast. Left breast: No mass or abnormal enhancement. Lymph nodes: The enlarged right axillary lymph node has return to normal size. No enlarged lymph nodes are identified in either axilla. The structures presumed to represent small bilateral intramammary lymph nodes are no longer visualized. Ancillary findings:  None. IMPRESSION: 1. There is complete imaging response  status post neoadjuvant chemotherapy of the known cancer in the right breast. No abnormal enhancement is seen in the right breast. 2. The probable bilateral intramammary lymph nodes described on the prior MRI report are no longer visualized 3.  No MRI evidence of left breast malignancy. RECOMMENDATION: Continue treatment plan. BI-RADS CATEGORY  6: Known biopsy-proven malignancy. Electronically Signed   By: Ammie Ferrier M.D.   On: 07/25/2017 11:46    ELIGIBLE FOR AVAILABLE RESEARCH PROTOCOL: [facility] SW[facility] [phone]; not a candidate for breast MRI study given history of claustrophobia  ASSESSMENT: 62 y.o. [place] Woman status post right breast upper outer quadrant biopsy 05/02/2017 for a clinical T2 N1-2, stage IIIB invasive ductal carcinoma, grade 2-3, triple negative, with an MIB-180%  (a) breast MRI 05/18/2017 shows T3 N1 disease with possible involvement of the internal mammary nodes  (1) neoadjuvant chemotherapy to consist of doxorubicin and cyclophosphamide in dose dense fashion x4 starting 05/24/2017 to be  followed by carboplatin and paclitaxel weekly x12  (2) definitive surgery to follow  (3) consider SWOG G4010 depending on final pathology results  (4) adjuvant radiation to follow  (5) genetics testing 06/22/2017, results pending  PLAN:  Lodie is doing moderately well today.  She met with Dr. Jana Hakim to discuss her numbness in her fingertips and how she is feeling and has tolerated her first four  cycles of Doxorubicin and Cyclophosphamide.  After the discussion, it was decided that she will not receive any further chemotherapy.  I will order her breast MRI and she will f/u with Dr. Jana Hakim, 2 days later, and Dr. Donne Hazel, her surgeon afterward too.  I reviewed this plan with her navigator, Bary Castilla RN who will be her point of contact during this transition period and helping [place] operationalize it smoothly.    Farran knows to call for any questions or concerns prior to her next  appointment with [place].    Wilber Bihari, NP  07/25/17 6:26 PM Medical Oncology and Hematology [facility] [address] [place], [place] [address] Tel. [phone]    Fax. [phone]   ADDENDUM: Jeniya's complaints are not exactly classic for neuropathy but they are certainly close enough that we cannot give her any further taxane treatment at this point, especially since the symptoms have not improved by holding therapy temporarily.  Accordingly she is done with chemotherapy for now.  She is ready to proceed to surgery.  Depending on the final surgical results she may be a candidate for capecitabine, but certainly no further chemotherapy IV.  Accordingly her port may be removed at the time of surgery.  I personally saw this patient and performed a substantive portion of this encounter with the listed APP documented above.   Chauncey Cruel, MD Medical Oncology and Hematology [facility] [address] [place], [place] [address] Tel. [phone]    Fax. [phone]

## 2017-07-26 ENCOUNTER — Other Ambulatory Visit: Payer: BC Managed Care – PPO

## 2017-07-26 ENCOUNTER — Ambulatory Visit: Payer: BC Managed Care – PPO

## 2017-07-26 ENCOUNTER — Ambulatory Visit: Payer: BC Managed Care – PPO | Admitting: Adult Health

## 2017-07-27 ENCOUNTER — Inpatient Hospital Stay: Payer: BC Managed Care – PPO | Attending: Oncology | Admitting: Oncology

## 2017-07-27 VITALS — BP 131/85 | HR 101 | Temp 98.7°F | Resp 18 | Ht 63.5 in | Wt 181.2 lb

## 2017-07-27 DIAGNOSIS — Z8042 Family history of malignant neoplasm of prostate: Secondary | ICD-10-CM | POA: Insufficient documentation

## 2017-07-27 DIAGNOSIS — G629 Polyneuropathy, unspecified: Secondary | ICD-10-CM | POA: Diagnosis not present

## 2017-07-27 DIAGNOSIS — Z7982 Long term (current) use of aspirin: Secondary | ICD-10-CM | POA: Insufficient documentation

## 2017-07-27 DIAGNOSIS — Z9221 Personal history of antineoplastic chemotherapy: Secondary | ICD-10-CM | POA: Insufficient documentation

## 2017-07-27 DIAGNOSIS — C50511 Malignant neoplasm of lower-outer quadrant of right female breast: Secondary | ICD-10-CM | POA: Diagnosis not present

## 2017-07-27 DIAGNOSIS — C50411 Malignant neoplasm of upper-outer quadrant of right female breast: Secondary | ICD-10-CM | POA: Diagnosis present

## 2017-07-27 DIAGNOSIS — Z803 Family history of malignant neoplasm of breast: Secondary | ICD-10-CM | POA: Diagnosis not present

## 2017-07-27 DIAGNOSIS — C773 Secondary and unspecified malignant neoplasm of axilla and upper limb lymph nodes: Secondary | ICD-10-CM | POA: Diagnosis not present

## 2017-07-27 DIAGNOSIS — Z171 Estrogen receptor negative status [ER-]: Secondary | ICD-10-CM | POA: Insufficient documentation

## 2017-07-27 DIAGNOSIS — Z79899 Other long term (current) drug therapy: Secondary | ICD-10-CM | POA: Insufficient documentation

## 2017-07-27 NOTE — Progress Notes (Signed)
[place]  [phone]) [phone] Fax:(336) [phone]     ID: [name] DOB: 04-Jun-1955  MR#: [id number]  AQT#:[id number]  Patient Care Team: Shirline Frees, MD as PCP - General (Family Medicine) Hiroto Saltzman, Virgie Dad, MD as Consulting Physician (Oncology) Rolm Bookbinder, MD as Consulting Physician (General Surgery) Gery Pray, MD as Consulting Physician (Radiation Oncology) Allyn Kenner, MD (Dermatology) Thurnell Lose, MD as Consulting Physician (Obstetrics and Gynecology) Marchia Bond, MD as Consulting Physician (Orthopedic Surgery) Christene Slates, MD as Physician Assistant (Radiology) OTHER MD:  CHIEF COMPLAINT: Triple negative breast cancer  CURRENT TREATMENT: Neoadjuvant chemotherapy   INTERVAL HISTORY: Emily Perez returns today for follow up and treatment of her triple negative breast cancer accompanied by her daughter. She is also here to discuss her recent MRI results.   She continues on neoadjuvant doxorubicin and cyclophosphamide dose dense -- even every 14 days x 4, to be followed by weekly paclitaxel x 12.   Since her last visit, she underwent a bilateral breast MRI with and without contrast, breast density category b, showing complete imaging response status post neoadjuvant chemotherapy of the known cancer in the right breast . No abnormal enhancement is seen in the right breast. The probable bilateral intramammary lymph nodes described on the prior MRI report are no longer visualized. No MRI evidence of left breast malignancy.     REVIEW OF SYSTEMS: [name] is doing well, overall. Her energy levels have improved. She has been walking the last few days. Her appetitie has improved by about 5%. She has a mild cough. The numbness has improved, however, it is still worse in her left foot. She denies unusual headaches, visual changes, nausea, vomiting, or dizziness. There has been no phlegm production, or pleurisy. This been no change in bowel or bladder  habits. She denies unexplained fatigue or unexplained weight loss, bleeding, rash, or fever. A detailed review of systems was otherwise noncontributory.    HISTORY OF CURRENT ILLNESS: Emily Perez had routine bilateral screening mammography at [facility] on 04/19/2017 showing a possible abnormality in the right breast. She underwent unilateral right diagnostic mammography with tomography and right breast ultrasonography at [facility] on 04/25/2017 showing: breast density category B.  In the right breast at the 6:00 radiant 2 cm from the nipple there was a 2 cm oval mass which by ultrasound measured 2.5 cm and was irregular and hypoechoic.  There were also at least 3 abnormal appearing lymph nodes in the right axilla.  Accordingly on 05/02/2017 she proceeded to biopsy of the right breast mass in question and lymph node sampling. The pathology from this procedure showed ([id number]): Invasive ductal carcinoma grade II. One lymph node positive for metastatic carcinoma (1/1). Prognostic indicators significant for: estrogen receptor, 0% negative and progesterone receptor, 0% negative. Proliferation marker Ki67 at 80%. HER2 not amplified with ratios HER2/CEP17 signals 1.29 and average copies per cell 2.25  The patient's subsequent history is as detailed below.    PAST MEDICAL HISTORY: Past Medical History:  Diagnosis Date  . Arthritis   . Family history of breast cancer   . Family history of prostate cancer   . Headache    migraines in the past  . Hypertension   . PONV (postoperative nausea and vomiting)    after 1st colonoscopy  Migraines in the past.   PAST SURGICAL HISTORY: Past Surgical History:  Procedure Laterality Date  . COLONOSCOPY    . FRACTURE SURGERY Left    wrist  . PORTACATH PLACEMENT Right 05/19/2017  Procedure: INSERTION PORT-A-CATH WITH ULTRASOUND;  Surgeon: Rolm Bookbinder, MD;  Location: [place];  Service: General;  Laterality: Right;    Wrist Fracture. Torn Rotator cuff  and torn meniscus.    FAMILY HISTORY Family History  Problem Relation Age of Onset  . Heart disease Mother   . Cancer Father   . Diabetes Father   . Hypertension Father   . Hyperlipidemia Father   . Heart disease Brother   . Hypertension Brother   . Hyperlipidemia Brother   . Diabetes Brother   . Prostate cancer Brother 59  . Breast cancer Sister 9  . Breast cancer Sister 76  The patient's father died at age 8 due to heart disease and emphysema. The patient's mother died at age 58 due to heart disease. The patient has 6 brothers and 8 sisters. She notes 2 sisters with breast cancer. The 1st sister was diagnosed at age 81, and the 2nd sister was diagnosed at age 26. She notes that both sisters are alive. She also notes a paternal first cousin with breast cancer diagnosed in her 25's. The patient notes that her father also had bone cancer, but she's unsure if he had myeloma. She denies a family history of ovarian cancer.   GYNECOLOGIC HISTORY:  No LMP recorded. Patient is postmenopausal. 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 She is divorced. She lives by herself with no pets. The patient's daughter, Emily Perez, works in Therapist, art for [employer]. The patient's son, Emily Perez,  works for [employer]. The patient has  2 grandchildren. She belongs to [facility].      ADVANCED DIRECTIVES: Not in place.  At the 05/19/2017 visit the patient was given the appropriate documents to complete and notarized at her discretion   HEALTH MAINTENANCE: Social History   Tobacco Use  . Smoking status: Never Smoker  . Smokeless tobacco: Never Used  Substance Use Topics  . Alcohol use: No  . Drug use: No     Colonoscopy:   PAP: September 2018 normal  Bone density: none   Allergies  Allergen  Reactions  . Latex Hives and Rash    Current Outpatient Medications  Medication Sig Dispense Refill  . acetaminophen (TYLENOL) 160 MG/5ML elixir Take 15 mg/kg by mouth every 4 (four) hours as needed for fever.    Marland Kitchen aspirin 81 MG tablet Take 81 mg by mouth daily.    . ciprofloxacin (CIPRO) 500 MG tablet Take 1 tablet (500 mg total) by mouth 2 (two) times daily. 10 tablet 0  . dexamethasone (DECADRON) 4 MG tablet Take 2 tablets by mouth once a day on the day after chemotherapy and then take 2 tablets two times a day for 2 days. Take with food. (Patient not taking: Reported on 06/28/2017) 30 tablet 1  . diphenhydramine-acetaminophen (TYLENOL PM) 25-500 MG TABS tablet Take 1 tablet by mouth at bedtime as needed.    . famotidine (PEPCID) 20 MG tablet Take 1 tablet (20 mg total) by mouth 2 (two) times daily. 60 tablet 3  . fluconazole (DIFLUCAN) 200 MG tablet Take 1 tablet (200 mg total) by mouth daily. (Patient not taking: Reported on 06/28/2017) 3 tablet 5  . fluocinonide cream (LIDEX) 4.25 % Apply 1 application topically 2 (two) times daily as needed (rash).    Marland Kitchen  halobetasol (ULTRAVATE) 0.05 % cream Apply 1 application topically 2 (two) times daily as needed (rash).    Marland Kitchen lidocaine-prilocaine (EMLA) cream Apply to affected area once (Patient not taking: Reported on 06/28/2017) 30 g 3  . LORazepam (ATIVAN) 0.5 MG tablet Take 1 tablet (0.5 mg total) by mouth at bedtime as needed (Nausea or vomiting). (Patient not taking: Reported on 06/28/2017) 30 tablet 0  . magic mouthwash w/lidocaine SOLN Take 5 mLs by mouth 3 (three) times daily as needed for mouth pain. 300 mL 0  . meclizine (ANTIVERT) 12.5 MG tablet Take 6.25 mg by mouth 3 (three) times daily as needed for dizziness.    . naproxen sodium (ALEVE) 220 MG tablet Take 110-220 mg by mouth daily as needed (pain). Takes 0.5-1 tablet as needed for pain    . neomycin-polymyxin-hydrocortisone (CORTISPORIN) 3.5-10000-1 OTIC suspension   0  . ondansetron  (ZOFRAN-ODT) 8 MG disintegrating tablet Take 8 mg by mouth every 8 (eight) hours as needed for nausea or vomiting.    Marland Kitchen OVER THE COUNTER MEDICATION Apply 1 application topically at bedtime. Pain Relief Balm    . prochlorperazine (COMPAZINE) 10 MG tablet Take 1 tablet (10 mg total) by mouth every 6 (six) hours as needed (Nausea or vomiting). (Patient not taking: Reported on 06/28/2017) 30 tablet 1  . triamterene-hydrochlorothiazide (DYAZIDE) 37.5-25 MG capsule Take 1 capsule by mouth daily.  3  . vitamin B-12 (CYANOCOBALAMIN) 1000 MCG tablet Take 500 mcg by mouth daily. Takes 0.5 tablet     No current facility-administered medications for this visit.     OBJECTIVE: Middle-aged African-American woman in no acute distress  Vitals:   07/27/17 1139  BP: 131/85  Pulse: (!) 101  Resp: 18  Temp: 98.7 F (37.1 C)  SpO2: 100%     Body mass index is 31.59 kg/m.   Wt Readings from Last 3 Encounters:  07/27/17 181 lb 3.2 oz (82.2 kg)  07/19/17 186 lb 8 oz (84.6 kg)  07/12/17 182 lb 6.4 oz (82.7 kg)   ECOG FS:1 - Symptomatic but completely ambulatory   Sclerae unicteric, EOMs intact Oropharynx clear and moist No cervical or supraclavicular adenopathy Lungs no rales or rhonchi Heart regular rate and rhythm Abd soft, nontender, positive bowel sounds MSK no focal spinal tenderness, no upper extremity lymphedema Neuro: nonfocal, well oriented, appropriate affect Breasts: I did not palpate a mass in either breast or either axilla.   LAB RESULTS:  CMP     Component Value Date/Time   NA 142 07/19/2017 0905   K 4.0 07/19/2017 0905   CL 108 07/19/2017 0905   CO2 27 07/19/2017 0905   GLUCOSE 100 07/19/2017 0905   BUN 15 07/19/2017 0905   CREATININE 1.08 07/19/2017 0905   CALCIUM 9.3 07/19/2017 0905   PROT 6.7 07/19/2017 0905   ALBUMIN 3.6 07/19/2017 0905   AST 15 07/19/2017 0905   ALT 9 07/19/2017 0905   ALKPHOS 82 07/19/2017 0905   BILITOT 0.2 07/19/2017 0905   GFRNONAA 53 (L)  07/19/2017 0905   GFRAA >60 07/19/2017 0905    No results found for: TOTALPROTELP, ALBUMINELP, A1GS, A2GS, BETS, BETA2SER, GAMS, MSPIKE, SPEI  No results found for: KPAFRELGTCHN, LAMBDASER, KAPLAMBRATIO  Lab Results  Component Value Date   WBC 8.4 07/19/2017   NEUTROABS 6.0 07/19/2017   HGB 9.8 (L) 07/19/2017   HCT 30.1 (L) 07/19/2017   MCV 79.6 07/19/2017   PLT 101 (L) 07/19/2017    _0 @  No results found for: LABCA2  No components found for: [id number]  No results for input(s): INR in the last 168 hours.  No results found for: LABCA2  No results found for: GNF621  No results found for: HYQ657  No results found for: QIO962  No results found for: CA2729  No components found for: HGQUANT  No results found for: CEA1 / No results found for: CEA1   No results found for: AFPTUMOR  No results found for: CHROMOGRNA  No results found for: PSA1  No visits with results within 3 Day(s) from this visit.  Latest known visit with results is:  Appointment on 07/19/2017  Component Date Value Ref Range Status  . WBC Count 07/19/2017 8.4  3.9 - 10.3 K/uL Final  . RBC 07/19/2017 3.79  3.70 - 5.45 MIL/uL Final  . Hemoglobin 07/19/2017 9.8* 11.6 - 15.9 g/dL Final  . HCT 07/19/2017 30.1* 34.8 - 46.6 % Final  . MCV 07/19/2017 79.6  79.5 - 101.0 fL Final  . MCH 07/19/2017 25.7  25.1 - 34.0 pg Final  . MCHC 07/19/2017 32.4  31.5 - 36.0 g/dL Final  . RDW 07/19/2017 17.3* 11.2 - 14.5 % Final  . Platelet Count 07/19/2017 101* 145 - 400 K/uL Final  . Neutrophils Relative % 07/19/2017 72  % Final  . Neutro Abs 07/19/2017 6.0  1.5 - 6.5 K/uL Final  . Lymphocytes Relative 07/19/2017 5  % Final  . Lymphs Abs 07/19/2017 0.4* 0.9 - 3.3 K/uL Final  . Monocytes Relative 07/19/2017 23  % Final  . Monocytes Absolute 07/19/2017 1.9* 0.1 - 0.9 K/uL Final  . Eosinophils Relative 07/19/2017 0  % Final  . Eosinophils Absolute 07/19/2017 0.0  0.0 - 0.5 K/uL Final  . Basophils Relative  07/19/2017 0  % Final  . Basophils Absolute 07/19/2017 0.0  0.0 - 0.1 K/uL Final   Performed at [facility], [place] [address] [place], [place] [address]  . Sodium 07/19/2017 142  136 - 145 mmol/L Final  . Potassium 07/19/2017 4.0  3.5 - 5.1 mmol/L Final  . Chloride 07/19/2017 108  98 - 109 mmol/L Final  . CO2 07/19/2017 27  22 - 29 mmol/L Final  . Glucose, Bld 07/19/2017 100  70 - 140 mg/dL Final  . BUN 07/19/2017 15  7 - 26 mg/dL Final  . Creatinine 07/19/2017 1.08  0.60 - 1.10 mg/dL Final  . Calcium 07/19/2017 9.3  8.4 - 10.4 mg/dL Final  . Total Protein 07/19/2017 6.7  6.4 - 8.3 g/dL Final  . Albumin 07/19/2017 3.6  3.5 - 5.0 g/dL Final  . AST 07/19/2017 15  5 - 34 U/L Final  . ALT 07/19/2017 9  0 - 55 U/L Final  . Alkaline Phosphatase 07/19/2017 82  40 - 150 U/L Final  . Total Bilirubin 07/19/2017 0.2  0.2 - 1.2 mg/dL Final  . GFR, Est Non Af Am 07/19/2017 53* >60 mL/min Final  . GFR, Est AFR Am 07/19/2017 >60  >60 mL/min Final   Comment: (NOTE) The eGFR has been calculated using the CKD EPI equation. This calculation has not been validated in all clinical situations. eGFR's persistently <60 mL/min signify possible Chronic Kidney Disease.   [name] Hahn gap 07/19/2017 7  3 - 11 Final   Performed at [facility], [place] [address] [place], [place] [address]    (this displays the last labs from the last 3 days)  No results found for: TOTALPROTELP, ALBUMINELP, A1GS, A2GS, BETS, BETA2SER, GAMS, MSPIKE, SPEI (  this displays SPEP labs)  No results found for: KPAFRELGTCHN, LAMBDASER, KAPLAMBRATIO (kappa/lambda light chains)  No results found for: HGBA, HGBA2QUANT, HGBFQUANT, HGBSQUAN (Hemoglobinopathy evaluation)   No results found for: LDH  Lab Results  Component Value Date   IRON 170 (H) 07/12/2017   TIBC 303 07/12/2017   IRONPCTSAT 56 07/12/2017   (Iron and TIBC)  Lab Results  Component Value Date   FERRITIN 685 (H)  07/12/2017    Urinalysis No results found for: COLORURINE, APPEARANCEUR, LABSPEC, PHURINE, GLUCOSEU, HGBUR, BILIRUBINUR, KETONESUR, PROTEINUR, UROBILINOGEN, NITRITE, LEUKOCYTESUR   STUDIES: Mr Breast Bilateral W Wo Contrast Inc Cad  Result Date: 07/25/2017 CLINICAL DATA:  62 year old female with triple negative right breast cancer metastatic to the right axilla. She was initially diagnosed in January of 2019, and presents today to evaluate for response to neoadjuvant chemotherapy. She has family history of breast cancer in 2 sisters diagnosed at the ages of 33 and 52. LABS:  Labs drawn on 07/19/2017 indicate creatinine is 1.08 mg/dL and GFR is 53. EXAM: BILATERAL BREAST MRI WITH AND WITHOUT CONTRAST TECHNIQUE: Multiplanar, multisequence MR images of both breasts were obtained prior to and following the intravenous administration of ml of 17 THREE-DIMENSIONAL MR IMAGE RENDERING ON INDEPENDENT WORKSTATION: Three-dimensional MR images were rendered by post-processing of the original MR data on an independent workstation. The three-dimensional MR images were interpreted, and findings are reported in the following complete MRI report for this study. Three dimensional images were evaluated at the independent DynaCad workstation COMPARISON:  Previous exam(s). FINDINGS: Breast composition: b. Scattered fibroglandular tissue. Background parenchymal enhancement: Minimal. Right breast: There is complete resolution of the abnormal enhancement in the right breast, previously measuring up to 6.8 cm. A port has been placed on the upper inner quadrant of the right breast. Left breast: No mass or abnormal enhancement. Lymph nodes: The enlarged right axillary lymph node has return to normal size. No enlarged lymph nodes are identified in either axilla. The structures presumed to represent small bilateral intramammary lymph nodes are no longer visualized. Ancillary findings:  None. IMPRESSION: 1. There is complete imaging  response status post neoadjuvant chemotherapy of the known cancer in the right breast. No abnormal enhancement is seen in the right breast. 2. The probable bilateral intramammary lymph nodes described on the prior MRI report are no longer visualized 3.  No MRI evidence of left breast malignancy. RECOMMENDATION: Continue treatment plan. BI-RADS CATEGORY  6: Known biopsy-proven malignancy. Electronically Signed   By: Ammie Ferrier M.D.   On: 07/25/2017 11:46    ELIGIBLE FOR AVAILABLE RESEARCH PROTOCOL: [facility] SWOG [phone]; not a candidate for breast MRI study given history of claustrophobia  ASSESSMENT: 62 y.o. [place] Woman status post right breast upper outer quadrant biopsy 05/02/2017 for a clinical T2 N1-2, stage IIIB invasive ductal carcinoma, grade 2-3, triple negative, with an MIB-180%  (a) breast MRI 05/18/2017 shows T3 N1 disease with possible involvement of the internal mammary nodes  (1) neoadjuvant chemotherapy consisting of doxorubicin and cyclophosphamide in dose dense fashion x4 starting 05/24/2017, completed 07/05/2017  (a) planned carboplatin/paclitaxel x12 omitted secondary to peripheral neuropathy concerns  (2) definitive surgery pending  (3) consider SWOG [id number] depending on final pathology results  (4) adjuvant radiation to follow.   (5) genetics testing offered 06/22/2017, patient opted against  PLAN: Hannan has had a very good radiologic response to her chemotherapy.  The correlation between radiology and pathology is good, and 90% of patients will have a complete radiologic response will also have  a complete pathologic response.  This is obviously very good news for her.  We discussed her case in conference today and she will see Dr. Donne Hazel today to plan her surgery.  We are going to keep the port in place in case she qualifies for swog as 1418, although that seems unlikely at this point.  She is going to return to see me in about 5 weeks.  She should be ready  to start radiation around that time  She knows to call for any other issues that may develop before the next visit.   Naaman Curro, Virgie Dad, MD  07/27/17 11:53 AM Medical Oncology and Hematology [facility] [address] [place], [place] [address] Tel. [phone]    Fax. [phone]  This document serves as a record of services personally performed by Chauncey Cruel, MD. It was created on his behalf by Margit Banda, a trained medical scribe. The creation of this record is based on the scribe's personal observations and the provider's statements to them.

## 2017-07-28 ENCOUNTER — Ambulatory Visit: Payer: BC Managed Care – PPO

## 2017-07-28 ENCOUNTER — Other Ambulatory Visit: Payer: Self-pay | Admitting: General Surgery

## 2017-07-28 ENCOUNTER — Telehealth: Payer: Self-pay | Admitting: Oncology

## 2017-07-28 DIAGNOSIS — C50111 Malignant neoplasm of central portion of right female breast: Secondary | ICD-10-CM

## 2017-07-28 NOTE — Telephone Encounter (Signed)
Per 5/8 no los °

## 2017-08-02 ENCOUNTER — Other Ambulatory Visit: Payer: BC Managed Care – PPO

## 2017-08-02 ENCOUNTER — Ambulatory Visit: Payer: BC Managed Care – PPO

## 2017-08-02 ENCOUNTER — Telehealth: Payer: Self-pay | Admitting: *Deleted

## 2017-08-02 NOTE — Telephone Encounter (Signed)
This RN spoke with pt per return VM call- Rhyann wanted to inquire about genetic testing and pending surgery.  " would the genetic test results influence or change my surgery "  [name] states she is scheduled for lumpectomy with lymph node dissection next week.  This RN reviewed above with MD - and informed her of conversation  .As far as her current breast cancer diagnosis - currently planned surgery is appropriate.  If she has a genetic predisposition - she would be high risk and  it would affect her life time occurrence of having a new breast cancer occurrence. Pt's who are at high risk are counseled on having bilateral mastectomies OR having intensified screening which would include breast MRI's.  If she knew ahead of surgery that she was high risk she could then decide on having double mastectomies ( and not have intensified screening ) instead of lumpectomy.  Khristian verbalized understanding.  Of note [name] stated concern due to miscommunication regarding scheduling of genetic testing " at my meeting with the genetic counselor - I said I would pursue if I knew my insurance would cover the cost - I asked if I should contact my insurance company - and was told they would and call me - but I didn't get a call from them until recently "  She would like Dr Jannifer Rodney to know of the above incident.  Note [name] verbalized understanding that per her current diagnosis planned surgery is appropriate - if she wants to wait until she gets genetic test results she should call Dr Donne Hazel and discuss with him.  No other needs at this time.

## 2017-08-03 ENCOUNTER — Inpatient Hospital Stay: Payer: BC Managed Care – PPO

## 2017-08-03 ENCOUNTER — Inpatient Hospital Stay (HOSPITAL_BASED_OUTPATIENT_CLINIC_OR_DEPARTMENT_OTHER): Payer: BC Managed Care – PPO | Admitting: Oncology

## 2017-08-03 ENCOUNTER — Telehealth: Payer: Self-pay | Admitting: Oncology

## 2017-08-03 ENCOUNTER — Inpatient Hospital Stay (HOSPITAL_COMMUNITY)
Admission: RE | Admit: 2017-08-03 | Discharge: 2017-08-03 | Disposition: A | Discharge status: Home / Self Care | Payer: BC Managed Care – PPO | Source: Ambulatory Visit

## 2017-08-03 VITALS — BP 124/76 | HR 83 | Temp 98.3°F | Resp 18 | Ht 63.5 in | Wt 178.7 lb

## 2017-08-03 DIAGNOSIS — G629 Polyneuropathy, unspecified: Secondary | ICD-10-CM

## 2017-08-03 DIAGNOSIS — Z171 Estrogen receptor negative status [ER-]: Secondary | ICD-10-CM

## 2017-08-03 DIAGNOSIS — Z8042 Family history of malignant neoplasm of prostate: Secondary | ICD-10-CM | POA: Diagnosis not present

## 2017-08-03 DIAGNOSIS — C50511 Malignant neoplasm of lower-outer quadrant of right female breast: Secondary | ICD-10-CM

## 2017-08-03 DIAGNOSIS — C50411 Malignant neoplasm of upper-outer quadrant of right female breast: Secondary | ICD-10-CM

## 2017-08-03 DIAGNOSIS — C773 Secondary and unspecified malignant neoplasm of axilla and upper limb lymph nodes: Secondary | ICD-10-CM

## 2017-08-03 DIAGNOSIS — Z7982 Long term (current) use of aspirin: Secondary | ICD-10-CM | POA: Diagnosis not present

## 2017-08-03 DIAGNOSIS — Z803 Family history of malignant neoplasm of breast: Secondary | ICD-10-CM | POA: Diagnosis not present

## 2017-08-03 LAB — CMP (CANCER CENTER ONLY)
ALT: 31 U/L (ref 0–55)
AST: 30 U/L (ref 5–34)
Albumin: 4.1 g/dL (ref 3.5–5.0)
Alkaline Phosphatase: 76 U/L (ref 40–150)
Anion gap: 7 (ref 3–11)
BUN: 22 mg/dL (ref 7–26)
CO2: 32 mmol/L — ABNORMAL HIGH (ref 22–29)
Calcium: 9.6 mg/dL (ref 8.4–10.4)
Chloride: 102 mmol/L (ref 98–109)
Creatinine: 1.38 mg/dL — ABNORMAL HIGH (ref 0.60–1.10)
GFR, Est AFR Am: 46 mL/min — ABNORMAL LOW (ref >60)
GFR, Estimated: 40 mL/min — ABNORMAL LOW (ref >60)
Glucose, Bld: 86 mg/dL (ref 70–140)
Potassium: 3.4 mmol/L — ABNORMAL LOW (ref 3.5–5.1)
Sodium: 141 mmol/L (ref 136–145)
Total Bilirubin: 0.5 mg/dL (ref 0.2–1.2)
Total Protein: 7.7 g/dL (ref 6.4–8.3)

## 2017-08-03 LAB — CBC WITH DIFFERENTIAL (CANCER CENTER ONLY)
Basophils Absolute: 0.1 10*3/uL (ref 0.0–0.1)
Basophils Relative: 1 %
Eosinophils Absolute: 0.3 10*3/uL (ref 0.0–0.5)
Eosinophils Relative: 4 %
HCT: 31.3 % — ABNORMAL LOW (ref 34.8–46.6)
Hemoglobin: 10.4 g/dL — ABNORMAL LOW (ref 11.6–15.9)
Lymphocytes Relative: 10 %
Lymphs Abs: 0.7 10*3/uL — ABNORMAL LOW (ref 0.9–3.3)
MCH: 26.5 pg (ref 25.1–34.0)
MCHC: 33.2 g/dL (ref 31.5–36.0)
MCV: 80 fL (ref 79.5–101.0)
Monocytes Absolute: 1.2 10*3/uL — ABNORMAL HIGH (ref 0.1–0.9)
Monocytes Relative: 19 %
Neutro Abs: 4.5 10*3/uL (ref 1.5–6.5)
Neutrophils Relative %: 66 %
Platelet Count: 240 10*3/uL (ref 145–400)
RBC: 3.92 MIL/uL (ref 3.70–5.45)
RDW: 19.6 % — ABNORMAL HIGH (ref 11.2–14.5)
WBC Count: 6.7 10*3/uL (ref 3.9–10.3)

## 2017-08-03 NOTE — Pre-Procedure Instructions (Signed)
NINFA GIANNELLI  08/03/2017      [facility] [address] [place] [address] Phone: [phone] Fax: [phone]    Your procedure is scheduled on May 20  Report to [facility] Admitting at 1200 P.M.  Call this number if you have problems the morning of surgery:  [phone]   Remember:   Do not eat food after midnight.  Only Clear Liquids after Midnight until 1100 am  NOTHING TO DRINK AFTER 1100 am  Drink PRE-SURGERY ENSURE BEFORE 1100 am  Continue all medications as directed by your physician except follow these medication instructions before surgery below   Take these medicines the morning of surgery with A SIP OF WATER  famotidine (PEPCID)  7 days prior to surgery STOP taking any Aspirin(unless otherwise instructed by your surgeon), Aleve, Naproxen, Ibuprofen, Motrin, Advil, Goody's, BC's, all herbal medications, fish oil, and all vitamins  Follow your doctors instructions regarding your Aspirin.  If no instructions were given by your doctor, then you will need to call the prescribing office office to get instructions.      Do not wear jewelry, make-up or nail polish.  Do not wear lotions, powders, or perfumes, or deodorant.  Do not shave 48 hours prior to surgery.    Do not bring valuables to the hospital.  [facility] is not responsible for any belongings or valuables.  Contacts, dentures or bridgework may not be worn into surgery.  Leave your suitcase in the car.  After surgery it may be brought to your room.  For patients admitted to the hospital, discharge time will be determined by your treatment team.  Patients discharged the day of surgery will not be allowed to drive home.    Special instructions:   [place]- Preparing For Surgery  Before surgery, you can play an important role. Because skin is not sterile, your skin needs to be  as free of germs as possible. You can reduce the number of germs on your skin by washing with CHG (chlorahexidine gluconate) Soap before surgery.  CHG is an antiseptic cleaner which kills germs and bonds with the skin to continue killing germs even after washing.  Oral Hygiene is also important to reduce your risk of infection.  Remember - BRUSH YOUR TEETH THE MORNING OF SURGERY  Please do not use if you have an allergy to CHG or antibacterial soaps. If your skin becomes reddened/irritated stop using the CHG.  Do not shave (including legs and underarms) for at least 48 hours prior to first CHG shower. It is OK to shave your face.  Please follow these instructions carefully.   1. Shower the NIGHT BEFORE SURGERY and the MORNING OF SURGERY with CHG.   2. If you chose to wash your hair, wash your hair first as usual with your normal shampoo.  3. After you shampoo, rinse your hair and body thoroughly to remove the shampoo.  4. Use CHG as you would any other liquid soap. You can apply CHG directly to the skin and wash gently with a scrungie or a clean washcloth.   5. Apply the CHG Soap to your body ONLY FROM THE NECK DOWN.  Do not use on open wounds or open sores. Avoid contact with your eyes, ears, mouth and genitals (private parts). Wash Face and genitals (private parts)  with your normal soap.  6.  Wash thoroughly, paying special attention to the area where your surgery will be performed.  7. Thoroughly rinse your body with warm water from the neck down.  8. DO NOT shower/wash with your normal soap after using and rinsing off the CHG Soap.  9. Pat yourself dry with a CLEAN TOWEL.  10. Wear CLEAN PAJAMAS to bed the night before surgery, wear comfortable clothes the morning of surgery  11. Place CLEAN SHEETS on your bed the night of your first shower and DO NOT SLEEP WITH PETS.    Day of Surgery:  Do not apply any deodorants/lotions.  Please wear clean clothes to the hospital/surgery  center.   Remember to brush your teeth.      Please read over the following fact sheets that you were given.

## 2017-08-03 NOTE — Telephone Encounter (Signed)
Gave patient AVS and calendar of upcoming June appointments.  °

## 2017-08-03 NOTE — Progress Notes (Signed)
[place]  Telephone:([phone]) [phone] Fax:(336) [phone]     ID: Emily Perez DOB: 10/21/1964  MR#: [id number]  GLO#:[id number]  Patient Care Team: Shirline Frees, MD as PCP - General (Family Medicine) Tynia Wiers, Virgie Dad, MD as Consulting Physician (Oncology) Rolm Bookbinder, MD as Consulting Physician (General Surgery) Gery Pray, MD as Consulting Physician (Radiation Oncology) Allyn Kenner, MD (Dermatology) Thurnell Lose, MD as Consulting Physician (Obstetrics and Gynecology) Marchia Bond, MD as Consulting Physician (Orthopedic Surgery) Christene Slates, MD as Physician Assistant (Radiology) OTHER MD:  CHIEF COMPLAINT: Triple negative breast cancer  CURRENT TREATMENT: Awaiting definitive surgery  INTERVAL HISTORY: [name] returns today for follow up and treatment of her triple negative breast cancer accompanied by her sister, [name]. She completed cycle 4 of neoadjuvant doxorubicin and cyclophosphamide.   Siboney was supposed to have had genetics counseling sometime ago, but threw a slip up on our part that was not done.  She did have her blood drawn today.  It will be 2 weeks before we have results and she is here today to discuss options in case she is positive.   REVIEW OF SYSTEMS: [name] reports that she was prepared to have her lumpectomy on 08/09/2062, Her sister is a breast cancer survivor, but she has not been genetically tested. She denies unusual headaches, visual changes, nausea, vomiting, or dizziness. There has been no unusual cough, phlegm production, or pleurisy. This been no change in bowel or bladder habits. She denies unexplained fatigue or unexplained weight loss, bleeding, rash, or fever. A detailed review of systems was otherwise stable.    HISTORY OF CURRENT ILLNESS: [name] had routine bilateral screening mammography at [facility] on 04/19/2017 showing a possible abnormality in the right breast. She underwent unilateral right  diagnostic mammography with tomography and right breast ultrasonography at [facility] on 04/25/2017 showing: breast density category B.  In the right breast at the 6:00 radiant 2 cm from the nipple there was a 2 cm oval mass which by ultrasound measured 2.5 cm and was irregular and hypoechoic.  There were also at least 3 abnormal appearing lymph nodes in the right axilla.  Accordingly on 05/02/2017 she proceeded to biopsy of the right breast mass in question and lymph node sampling. The pathology from this procedure showed ([id number]): Invasive ductal carcinoma grade II. One lymph node positive for metastatic carcinoma (1/1). Prognostic indicators significant for: estrogen receptor, 0% negative and progesterone receptor, 0% negative. Proliferation marker Ki67 at 80%. HER2 not amplified with ratios HER2/CEP17 signals 1.29 and average copies per cell 2.25  The patient's subsequent history is as detailed below.    PAST MEDICAL HISTORY: Past Medical History:  Diagnosis Date  . Arthritis   . Family history of breast cancer   . Family history of prostate cancer   . Headache    migraines in the past  . Hypertension   . PONV (postoperative nausea and vomiting)    after 1st colonoscopy  Migraines in the past.   PAST SURGICAL HISTORY: Past Surgical History:  Procedure Laterality Date  . COLONOSCOPY    . FRACTURE SURGERY Left    wrist  . PORTACATH PLACEMENT Right 05/19/2017   Procedure: INSERTION PORT-A-CATH WITH ULTRASOUND;  Surgeon: Rolm Bookbinder, MD;  Location: [place];  Service: General;  Laterality: Right;    Wrist Fracture. Torn Rotator cuff and torn meniscus.    FAMILY HISTORY Family History  Problem Relation Age of Onset  . Heart disease Mother   . Cancer Father   .  Diabetes Father   . Hypertension Father   . Hyperlipidemia Father   . Heart disease Brother   . Hypertension Brother   . Hyperlipidemia Brother   . Diabetes Brother   . Prostate cancer Brother 70  . Breast cancer  Sister 49  . Breast cancer Sister 36  The patient's father died at age 64 due to heart disease and emphysema. The patient's mother died at age 64 due to heart disease. The patient has 6 brothers and 8 sisters. She notes 2 sisters with breast cancer. The 1st sister was diagnosed at age 24, and the 2nd sister was diagnosed at age 38. She notes that both sisters are alive. She also notes a paternal first cousin with breast cancer diagnosed in her 23's. The patient notes that her father also had bone cancer, but she's unsure if he had myeloma. She denies a family history of ovarian cancer.   GYNECOLOGIC HISTORY:  No LMP recorded. Patient is postmenopausal. 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 She is divorced. She lives by herself with no pets. The patient's daughter, [name], works in Therapist, art for [employer]. The patient's son, [name],  works for [employer]. The patient has  2 grandchildren. She belongs to [facility].      ADVANCED DIRECTIVES: Not in place.  At the 05/19/2017 visit the patient was given the appropriate documents to complete and notarized at her discretion   HEALTH MAINTENANCE: Social History   Tobacco Use  . Smoking status: Never Smoker  . Smokeless tobacco: Never Used  Substance Use Topics  . Alcohol use: No  . Drug use: No     Colonoscopy:   PAP: September 2018 normal  Bone density: none   Allergies  Allergen Reactions  . Latex Hives and Rash    Current Outpatient Medications  Medication Sig Dispense Refill  . aspirin 81 MG tablet Take 81 mg by mouth 3 (three) times a week.     . famotidine (PEPCID) 20 MG tablet Take 1 tablet (20 mg total) by mouth 2 (two) times daily. (Patient taking differently: Take 20 mg by mouth daily. ) 60 tablet 3  . fluocinonide  cream (LIDEX) 7.34 % Apply 1 application topically 2 (two) times daily as needed (rash).    . meclizine (ANTIVERT) 12.5 MG tablet Take 6.25 mg by mouth 3 (three) times daily as needed for dizziness.    . Multiple Vitamins-Minerals (MULTIVITAMIN WITH MINERALS) tablet Take 1 tablet by mouth daily.    . naproxen sodium (ANAPROX) 550 MG tablet Take 550 mg by mouth daily as needed (pain). Takes 0.5-1 tablet as needed for pain     . OVER THE COUNTER MEDICATION Apply 1 application topically at bedtime as needed. Pain Relief Balm     . triamterene-hydrochlorothiazide (DYAZIDE) 37.5-25 MG capsule Take 1 capsule by mouth daily.  3  . vitamin B-12 (CYANOCOBALAMIN) 1000 MCG tablet Take 500 mcg by mouth daily as needed. Takes 0.5 tablet      No current facility-administered medications for this visit.     OBJECTIVE: Middle-aged African-American woman who appears well  Vitals:   08/03/17 1130  BP: 124/76  Pulse: 83  Resp: 18  Temp: 98.3 F (36.8 C)  SpO2: 98%     Body mass index is 31.16 kg/m.  Wt Readings from Last 3 Encounters:  08/03/17 178 lb 11.2 oz (81.1 kg)  07/27/17 181 lb 3.2 oz (82.2 kg)  07/19/17 186 lb 8 oz (84.6 kg)   ECOG FS:1 - Symptomatic but completely ambulatory   LAB RESULTS:  CMP     Component Value Date/Time   NA 142 07/19/2017 0905   K 4.0 07/19/2017 0905   CL 108 07/19/2017 0905   CO2 27 07/19/2017 0905   GLUCOSE 100 07/19/2017 0905   BUN 15 07/19/2017 0905   CREATININE 1.08 07/19/2017 0905   CALCIUM 9.3 07/19/2017 0905   PROT 6.7 07/19/2017 0905   ALBUMIN 3.6 07/19/2017 0905   AST 15 07/19/2017 0905   ALT 9 07/19/2017 0905   ALKPHOS 82 07/19/2017 0905   BILITOT 0.2 07/19/2017 0905   GFRNONAA 53 (L) 07/19/2017 0905   GFRAA >60 07/19/2017 0905    No results found for: TOTALPROTELP, ALBUMINELP, A1GS, A2GS, BETS, BETA2SER, GAMS, MSPIKE, SPEI  No results found for: KPAFRELGTCHN, LAMBDASER, KAPLAMBRATIO  Lab Results  Component Value Date   WBC 6.7  08/03/2017   NEUTROABS 4.5 08/03/2017   HGB 10.4 (L) 08/03/2017   HCT 31.3 (L) 08/03/2017   MCV 80.0 08/03/2017   PLT 240 08/03/2017    _0 @  No results found for: LABCA2  No components found for: YNWGNF621  No results for input(s): INR in the last 168 hours.  No results found for: LABCA2  No results found for: HYQ657  No results found for: QIO962  No results found for: XBM841  No results found for: CA2729  No components found for: HGQUANT  No results found for: CEA1 / No results found for: CEA1   No results found for: AFPTUMOR  No results found for: CHROMOGRNA  No results found for: PSA1  Appointment on 08/03/2017  Component Date Value Ref Range Status  . WBC Count 08/03/2017 6.7  3.9 - 10.3 K/uL Final  . RBC 08/03/2017 3.92  3.70 - 5.45 MIL/uL Final  . Hemoglobin 08/03/2017 10.4* 11.6 - 15.9 g/dL Final  . HCT 08/03/2017 31.3* 34.8 - 46.6 % Final  . MCV 08/03/2017 80.0  79.5 - 101.0 fL Final  . MCH 08/03/2017 26.5  25.1 - 34.0 pg Final  . MCHC 08/03/2017 33.2  31.5 - 36.0 g/dL Final  . RDW 08/03/2017 19.6* 11.2 - 14.5 % Final  . Platelet Count 08/03/2017 240  145 - 400 K/uL Final  . Neutrophils Relative % 08/03/2017 66  % Final  . Neutro Abs 08/03/2017 4.5  1.5 - 6.5 K/uL Final  . Lymphocytes Relative 08/03/2017 10  % Final  . Lymphs Abs 08/03/2017 0.7* 0.9 - 3.3 K/uL Final  . Monocytes Relative 08/03/2017 19  % Final  . Monocytes Absolute 08/03/2017 1.2* 0.1 - 0.9 K/uL Final  . Eosinophils Relative 08/03/2017 4  % Final  . Eosinophils Absolute 08/03/2017 0.3  0.0 - 0.5 K/uL Final  . Basophils Relative 08/03/2017 1  % Final  . Basophils Absolute 08/03/2017 0.1  0.0 - 0.1 K/uL Final   Performed at [facility], [place] [address], [place], [place] [address]    (this displays the last labs from the last 3 days)  No results found for: TOTALPROTELP, ALBUMINELP, A1GS, A2GS, BETS, BETA2SER, GAMS, MSPIKE, SPEI (this displays SPEP  labs)  No results found for: KPAFRELGTCHN, LAMBDASER, KAPLAMBRATIO (kappa/lambda light chains)  No results found for: HGBA, HGBA2QUANT, HGBFQUANT, HGBSQUAN (Hemoglobinopathy evaluation)   No results found for: LDH  Lab Results  Component Value Date   IRON 170 (H) 07/12/2017   TIBC 303 07/12/2017   IRONPCTSAT 56 07/12/2017   (Iron and TIBC)  Lab Results  Component Value Date   FERRITIN 685 (H) 07/12/2017    Urinalysis No results found for: COLORURINE, APPEARANCEUR, LABSPEC, PHURINE, GLUCOSEU, HGBUR, BILIRUBINUR, KETONESUR, PROTEINUR, UROBILINOGEN, NITRITE, LEUKOCYTESUR   STUDIES: Mr Breast Bilateral W Wo Contrast Inc Cad  Result Date: 07/25/2017 CLINICAL DATA:  62 year old female with triple negative right breast cancer metastatic to the right axilla. She was initially diagnosed in January of 2019, and presents today to evaluate for response to neoadjuvant chemotherapy. She has family history of breast cancer in 2 sisters diagnosed at the ages of 54 and 35. LABS:  Labs drawn on 07/19/2017 indicate creatinine is 1.08 mg/dL and GFR is 53. EXAM: BILATERAL BREAST MRI WITH AND WITHOUT CONTRAST TECHNIQUE: Multiplanar, multisequence MR images of both breasts were obtained prior to and following the intravenous administration of ml of 17 THREE-DIMENSIONAL MR IMAGE RENDERING ON INDEPENDENT WORKSTATION: Three-dimensional MR images were rendered by post-processing of the original MR data on an independent workstation. The three-dimensional MR images were interpreted, and findings are reported in the following complete MRI report for this study. Three dimensional images were evaluated at the independent DynaCad workstation COMPARISON:  Previous exam(s). FINDINGS: Breast composition: b. Scattered fibroglandular tissue. Background parenchymal enhancement: Minimal. Right breast: There is complete resolution of the abnormal enhancement in the right breast, previously measuring up to 6.8 cm. A port has  been placed on the upper inner quadrant of the right breast. Left breast: No mass or abnormal enhancement. Lymph nodes: The enlarged right axillary lymph node has return to normal size. No enlarged lymph nodes are identified in either axilla. The structures presumed to represent small bilateral intramammary lymph nodes are no longer visualized. Ancillary findings:  None. IMPRESSION: 1. There is complete imaging response status post neoadjuvant chemotherapy of the known cancer in the right breast. No abnormal enhancement is seen in the right breast. 2. The probable bilateral intramammary lymph nodes described on the prior MRI report are no longer visualized 3.  No MRI evidence of left breast malignancy. RECOMMENDATION: Continue treatment plan. BI-RADS CATEGORY  6: Known biopsy-proven malignancy. Electronically Signed   By: Ammie Ferrier M.D.   On: 07/25/2017 11:46    ELIGIBLE FOR AVAILABLE RESEARCH PROTOCOL: [facility] [phone]; not a candidate for breast MRI study given history of claustrophobia  ASSESSMENT: 62 y.o. [place] Woman status post right breast upper outer quadrant biopsy 05/02/2017 for a clinical T2 N1-2, stage IIIB invasive ductal carcinoma, grade 2-3, triple negative, with an MIB-180%  (a) breast MRI 05/18/2017 shows T3 N1 disease with possible involvement of the internal mammary nodes  (1) neoadjuvant chemotherapy consisting of doxorubicin and cyclophosphamide in dose dense fashion x4 starting 05/24/2017, completed 07/05/2017  (a) planned carboplatin/paclitaxel x12 omitted secondary to peripheral neuropathy concerns  (2) definitive surgery pending  (3) consider SWOG [id number] depending on final pathology results  (4) adjuvant radiation to follow.  (5) genetics testing 08/03/2017, results pending  PLAN: I apologized to Fadia for our not having scheduled her genetics testing earlier.  It is important to get this done and get the results before she has her definitive surgery.  Today  we did discuss her options.  In patients who carry a deleterious mutation [for example in a  BRCA gene], the risk of a new breast cancer developing in the future may be sufficiently great that the patient may choose  bilateral mastectomies. However if she wishes to keep her breasts in that situation it is safe to do so. That would require intensified screening, which generally means not only yearly mammography but a yearly breast MRI as well. Of course, if there is a deleterious mutation bilateral oophorectomy would be necessary as there is no standard screening protocol for ovarian cancer.  After this discussion Shaniyah thinks that if she does carry a deleterious mutation that puts her at high risk of developing another breast cancer in the future she would probably want bilateral mastectomies.  In that case of course she would need to meet with plastics first as well as discussed the options with Dr. Donne Hazel.  At this point we are canceling the surgery next week, waiting on the genetics results, and then taking it from there  She knows to call for any other issues that may develop before her next visit.   Chaia Ikard, Virgie Dad, MD  08/03/17 11:48 AM Medical Oncology and Hematology [facility] [address] [place], [place] [address] Tel. [phone]    Fax. [phone]  This document serves as a record of services personally performed by Lurline Del, MD. It was created on his behalf by Sheron Nightingale, a trained medical scribe. The creation of this record is based on the scribe's personal observations and the provider's statements to them.   I have reviewed the above documentation for accuracy and completeness, and I agree with the above.

## 2017-08-03 NOTE — Progress Notes (Signed)
Patient called and her surgery has been postponed until a later date.  That date is unknown.  Patient is canceling her pre-op appointment for now.  I gave patient the preadmission scheduling phone number to call when she gets her new surgery date

## 2017-08-04 ENCOUNTER — Ambulatory Visit: Payer: BC Managed Care – PPO | Admitting: Oncology

## 2017-08-04 ENCOUNTER — Inpatient Hospital Stay: Payer: BC Managed Care – PPO | Admitting: Oncology

## 2017-08-04 ENCOUNTER — Ambulatory Visit: Payer: BC Managed Care – PPO

## 2017-08-08 ENCOUNTER — Other Ambulatory Visit (HOSPITAL_COMMUNITY): Payer: BC Managed Care – PPO

## 2017-08-09 ENCOUNTER — Other Ambulatory Visit: Payer: Self-pay | Admitting: *Deleted

## 2017-08-09 DIAGNOSIS — Z171 Estrogen receptor negative status [ER-]: Secondary | ICD-10-CM

## 2017-08-09 DIAGNOSIS — C50511 Malignant neoplasm of lower-outer quadrant of right female breast: Secondary | ICD-10-CM

## 2017-08-16 ENCOUNTER — Encounter: Payer: Self-pay | Admitting: Genetic Counselor

## 2017-08-16 ENCOUNTER — Telehealth: Payer: Self-pay | Admitting: Genetic Counselor

## 2017-08-16 DIAGNOSIS — Z1379 Encounter for other screening for genetic and chromosomal anomalies: Secondary | ICD-10-CM | POA: Insufficient documentation

## 2017-08-16 NOTE — Pre-Procedure Instructions (Signed)
Emily Perez  08/16/2017      [facility] - [place], [place] [address] [place] [address] Phone: [phone] Fax: [phone]    Your procedure is scheduled on June 11  Report to [facility] at 0800 A.M.  Call this number if you have problems the morning of surgery:  [phone]   Remember:  No food after midnight.  You may drink clear liquids until 0700am .  Clear liquids allowed are:                    Water and Gatorade   DRINK THE PRESURGICAL ENSURE BY 0700am    Take these medicines the morning of surgery with A SIP OF WATER  famotidine (PEPCID)   7 days prior to surgery STOP taking any Aspirin(unless otherwise instructed by your surgeon), Aleve, Naproxen, Ibuprofen, Motrin, Advil, Goody's, BC's, all herbal medications, fish oil, and all vitamins  Follow your doctors instructions regarding your Aspirin.  If no instructions were given by your doctor, then you will need to call the prescribing office office to get instructions.       Do not wear jewelry, make-up or nail polish.  Do not wear lotions, powders, or perfumes, or deodorant.  Do not shave 48 hours prior to surgery.    Do not bring valuables to the hospital.  [facility] is not responsible for any belongings or valuables.  Contacts, dentures or bridgework may not be worn into surgery.  Leave your suitcase in the car.  After surgery it may be brought to your room.  For patients admitted to the hospital, discharge time will be determined by your treatment team.  Patients discharged the day of surgery will not be allowed to drive home.    Special instructions:   [facility]- Preparing For Surgery  Before surgery, you can play an important role. Because skin is not sterile, your skin needs to be as free of germs as possible. You can reduce the number of germs on your skin by washing with CHG  (chlorahexidine gluconate) Soap before surgery.  CHG is an antiseptic cleaner which kills germs and bonds with the skin to continue killing germs even after washing.    Oral Hygiene is also important to reduce your risk of infection.  Remember - BRUSH YOUR TEETH THE MORNING OF SURGERY WITH YOUR REGULAR TOOTHPASTE  Please do not use if you have an allergy to CHG or antibacterial soaps. If your skin becomes reddened/irritated stop using the CHG.  Do not shave (including legs and underarms) for at least 48 hours prior to first CHG shower. It is OK to shave your face.  Please follow these instructions carefully.   1. Shower the NIGHT BEFORE SURGERY and the MORNING OF SURGERY with CHG.   2. If you chose to wash your hair, wash your hair first as usual with your normal shampoo.  3. After you shampoo, rinse your hair and body thoroughly to remove the shampoo.  4. Use CHG as you would any other liquid soap. You can apply CHG directly to the skin and wash gently with a scrungie or a clean washcloth.   5. Apply the CHG Soap to your body ONLY FROM THE NECK DOWN.  Do not use on open wounds or open sores. Avoid contact with your eyes, ears, mouth and genitals (private parts). Wash Face and  genitals (private parts)  with your normal soap.  6. Wash thoroughly, paying special attention to the area where your surgery will be performed.  7. Thoroughly rinse your body with warm water from the neck down.  8. DO NOT shower/wash with your normal soap after using and rinsing off the CHG Soap.  9. Pat yourself dry with a CLEAN TOWEL.  10. Wear CLEAN PAJAMAS to bed the night before surgery, wear comfortable clothes the morning of surgery  11. Place CLEAN SHEETS on your bed the night of your first shower and DO NOT SLEEP WITH PETS.    Day of Surgery:  Do not apply any deodorants/lotions.  Please wear clean clothes to the hospital/surgery center.   Remember to brush your teeth WITH YOUR REGULAR  TOOTHPASTE.    Please read over the following fact sheets that you were given.

## 2017-08-16 NOTE — Telephone Encounter (Signed)
Revealed a positive test result on PALB2. Discussed that this could explain her sisters dx of breast cancer and her brother's dx of prostate cancer.  She is coming from [place], [place] today and will call tomorrow to set up an appointment to discuss result.  I will forward this call to Drs. Magrinat and Donne Hazel.

## 2017-08-17 ENCOUNTER — Encounter (HOSPITAL_COMMUNITY)
Admission: RE | Admit: 2017-08-17 | Discharge: 2017-08-17 | Disposition: A | Discharge status: Home / Self Care | Payer: BC Managed Care – PPO | Source: Ambulatory Visit | Attending: General Surgery | Admitting: General Surgery

## 2017-08-17 ENCOUNTER — Other Ambulatory Visit: Payer: Self-pay

## 2017-08-17 ENCOUNTER — Encounter (HOSPITAL_COMMUNITY): Payer: Self-pay

## 2017-08-17 DIAGNOSIS — Z01812 Encounter for preprocedural laboratory examination: Secondary | ICD-10-CM | POA: Diagnosis present

## 2017-08-17 LAB — CBC
HCT: 31.4 % — ABNORMAL LOW (ref 36.0–46.0)
Hemoglobin: 10 g/dL — ABNORMAL LOW (ref 12.0–15.0)
MCH: 25.8 pg — ABNORMAL LOW (ref 26.0–34.0)
MCHC: 31.8 g/dL (ref 30.0–36.0)
MCV: 81.1 fL (ref 78.0–100.0)
Platelets: 226 10*3/uL (ref 150–400)
RBC: 3.87 MIL/uL (ref 3.87–5.11)
RDW: 18.7 % — ABNORMAL HIGH (ref 11.5–15.5)
WBC: 6.5 10*3/uL (ref 4.0–10.5)

## 2017-08-17 LAB — BASIC METABOLIC PANEL
Anion gap: 8 (ref 5–15)
BUN: 23 mg/dL — ABNORMAL HIGH (ref 6–20)
CO2: 28 mmol/L (ref 22–32)
Calcium: 9.3 mg/dL (ref 8.9–10.3)
Chloride: 104 mmol/L (ref 101–111)
Creatinine, Ser: 1.22 mg/dL — ABNORMAL HIGH (ref 0.44–1.00)
GFR calc Af Amer: 53 mL/min — ABNORMAL LOW (ref >60)
GFR calc non Af Amer: 46 mL/min — ABNORMAL LOW (ref >60)
Glucose, Bld: 93 mg/dL (ref 65–99)
Potassium: 3.7 mmol/L (ref 3.5–5.1)
Sodium: 140 mmol/L (ref 135–145)

## 2017-08-17 NOTE — Progress Notes (Signed)
PCP - Shirline Frees Cardiologist - denies  Chest x-ray - not needed EKG - 05/19/17 Stress Test - denies ECHO - 2019 Cardiac Cath - denies   Aspirin Instructions: last dose 5/29  Anesthesia review: NO  Patient denies shortness of breath, fever, cough and chest pain at PAT appointment   Patient verbalized understanding of instructions that were given to them at the PAT appointment. Patient was also instructed that they will need to review over the PAT instructions again at home before surgery.

## 2017-08-18 ENCOUNTER — Encounter: Payer: Self-pay | Admitting: General Practice

## 2017-08-18 NOTE — Progress Notes (Signed)
[place] CSW Progress Notes  Call from patient, wants to complete Advance Directives while at [facility] tomorrow.  Attempted to call patient back to schedule appt; however, mailbox was full and unable to accept messages.  CSW Wallis Bamberg is available to notarize document tomorrow at 10:30 or noon depending on patient availability.  Edwyna Shell, LCSW Clinical Social Worker Phone:  [phone]

## 2017-08-19 ENCOUNTER — Encounter: Payer: Self-pay | Admitting: Genetic Counselor

## 2017-08-19 ENCOUNTER — Encounter: Payer: Self-pay | Admitting: General Practice

## 2017-08-19 ENCOUNTER — Inpatient Hospital Stay (HOSPITAL_BASED_OUTPATIENT_CLINIC_OR_DEPARTMENT_OTHER): Payer: BC Managed Care – PPO | Admitting: Genetic Counselor

## 2017-08-19 DIAGNOSIS — Z171 Estrogen receptor negative status [ER-]: Secondary | ICD-10-CM

## 2017-08-19 DIAGNOSIS — Z1379 Encounter for other screening for genetic and chromosomal anomalies: Secondary | ICD-10-CM

## 2017-08-19 DIAGNOSIS — C50511 Malignant neoplasm of lower-outer quadrant of right female breast: Secondary | ICD-10-CM

## 2017-08-19 DIAGNOSIS — Z803 Family history of malignant neoplasm of breast: Secondary | ICD-10-CM

## 2017-08-19 DIAGNOSIS — Z8042 Family history of malignant neoplasm of prostate: Secondary | ICD-10-CM

## 2017-08-19 NOTE — Progress Notes (Signed)
GENETIC TEST RESULTS   Patient Name: Emily Perez Patient Age: 62 y.o. Encounter Date: 08/19/2017  Referring Provider: Lurline Del, MD    Ms. Haffey was seen in the [place] clinic on Aug 19, 2017 due to a personal and family history of cancer and concern regarding a hereditary predisposition to cancer in the family. Please refer to the prior Genetics clinic note for more information regarding Ms. Weldin's medical and family histories and our assessment at the time.   FAMILY HISTORY:  We obtained a detailed, 4-generation family history.  Significant diagnoses are listed below: Family History  Problem Relation Age of Onset  . Heart disease Mother   . Cancer Father   . Diabetes Father   . Hypertension Father   . Hyperlipidemia Father   . Heart disease Brother   . Hypertension Brother   . Hyperlipidemia Brother   . Diabetes Brother   . Prostate cancer Brother 16  . Breast cancer Sister 87  . Breast cancer Sister 30  . Cancer Paternal Aunt        unsure what type of cancer she had  . Prostate cancer Paternal Uncle     The patient has two children who are cancer free.  She has seven sisters and six brothers.  Two sisters were diagnosed with breast cancer, one at 52 and the other at 65.  One brother had prostate cancer at 75.  The patient's parents are both deceased.  The patient's mother died from heart disease at 61.  She had 6-7 siblings who did not have cancer.  The maternal grandparents are both deceased.  The patient's father died of bone cancer.  It is unknown if this was a primary cancer or a metastasis.  He had three sisters and two brothers.  One brother had prostate cancer and a sister had an unknown cancer.  Both paternal grandparents are deceased.  Ms. Hascall is unaware of previous family history of genetic testing for hereditary cancer risks. Patient's maternal ancestors are of African American descent, and paternal ancestors are of  African American descent. There is no reported Ashkenazi Jewish ancestry. There is no known consanguinity.  GENETIC TESTING: At the time of Ms. Kovacevic's visit, we recommended she pursue genetic testing of the Multi cancer panel. 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 There were no deleterious mutations in ALK, APC, ATM, AXIN2,BAP1,  BARD1, BLM, BMPR1A, BRCA1, BRCA2, BRIP1, CASR, CDC73, CDH1, CDK4, CDKN1B, CDKN1C, CDKN2A (p14ARF), CDKN2A (p16INK4a), CEBPA, CHEK2, CTNNA1, DICER1, DIS3L2, EGFR (c.2369C>T, p.Thr790Met variant only), EPCAM (Deletion/duplication testing only), FH, FLCN, GATA2, GPC3, GREM1 (Promoter region deletion/duplication testing only), HOXB13 (c.251G>A, p.Gly84Glu), HRAS, KIT, MAX, MEN1, MET, MITF (c.952G>A, p.Glu318Lys variant only), MLH1, MSH2, MSH3, MSH6, MUTYH, NBN, NF1, NF2, NTHL1, PDGFRA, PHOX2B, PMS2, POLD1, POLE, POT1, PRKAR1A, PTCH1, PTEN, RAD50, RAD51C, RAD51D, RB1, RECQL4, RET, RUNX1, SDHAF2, SDHA (sequence changes only), SDHB, SDHC, SDHD, SMAD4, SMARCA4, SMARCB1, SMARCE1, STK11, SUFU, TERT, TERT, TMEM127, TP53, TSC1, TSC2, VHL, WRN and WT1.  .  Genetic testing did detect a Variant of Unknown Significance in the CDH1 gene called c.2069G>A (p.Gly690Glu). At this time, it is unknown if this variant is associated with increased cancer risk or if this is a normal finding, but most variants such as this get reclassified to being inconsequential. It should not be used to make medical management decisions. With time, we suspect the  lab will determine the significance of this variant, if any. If we do learn more about it, we will try to contact Ms. Monreal to discuss it further. However, it is important to stay in touch with [place] periodically and keep the address and phone number up to date.   Clinical condition The  risk of breast cancer in women with a single pathogenic PALB2 variant is 33-58% by age 88, with higher risks among those with a greater number of relatives with breast cancer (PMID: 66599357, 01779390, 30092330, 07622633). One study found the risk of developing contralateral breast cancer is approximately 10% within five years after the initial diagnosis of breast cancer among individuals with a pathogenic variant in [place] (PMID: 35456256).  For both men and women, there is also an increased risk for pancreatic cancer, however, specific risk figures are not yet established (PMID: 38937342, 87681157, 26203559). Additional data suggests an increased risk of ovarian cancer (PMID: 74163845, 36468032) and female breast cancer (PMID: 12248250, 03704888, 91694503), although this evidence is limited and emerging.  Gene information PALB2 is a tumor-suppressor gene, meaning its function is to help control the rate of growth and cell division in the body. The protein product plays a critical role in homologous recombination repair (HRR) through its ability to recruit BRCA2 and RAD51 to DNA breaks (Uniprot: PALB2_HUMAN,Q86YC2 ReportMortgages.tn. Accessed January 2017). If there is a pathogenic variant in this gene that prevents it from functioning normally, the risk of developing certain types of cancers may be increased.  Inheritance Hereditary predisposition to cancer due to pathogenic variants in the PALB2 gene has autosomal dominant inheritance. This means that an individual with a pathogenic variant has a 50% chance of passing the condition on to their offspring. Once a pathogenic mutation is detected in an individual, it is possible to identify at-risk relatives who can pursue testing for this specific familial variant. Many cases are inherited from a parent, but some cases may occur spontaneously (i.e., an individual with a pathogenic variant who has parents who do not have it).  Individuals  with a single pathogenic PALB2 variant are also carriers of autosomal recessive Fanconi anemia type N. Fanconi anemia is characterized by bone marrow failure with variable additional anomalies, which often include short stature, abnormal skin pigmentation, abnormal thumbs, malformations of the skeletal and central nervous systems, and developmental delay (PMID: 8882800, 34917915). Risk of leukemia and early onset solid tumors is significantly elevated with this disorder (PMID: 05697948, 01655374, 82707867). For there to be a risk of Fanconi anemia in offspring, both the patient and their partner would each have to carry a pathogenic variant in [place]; in this case, the risk to have an affected child is 25%.  MEDICAL MANAGEMENT: The [place] (NCCN) has published screening and surveillance guidelines for women with a single pathogenic variant in [place] (NCCN. Genetic/Familial High-Risk Assessment: Breast and Ovarian. Version 1.2018):  - Annual mammography with consideration of tomosynthesis beginning at age 4 ---Consider annual breast MRI with contrast starting at age 67, with modification as appropriate based on family history ---Prophylactic risk-reducing mastectomy: consider based on family history (evidence insufficient; manage based on family history)  - NCCN cites insufficient evidence to warrant screening for ovarian and pancreatic cancer (NCCN. Genetic/Familial High-Risk Assessment: Breast and Ovarian. Version 3.2019). In contrast, the [employer] of Gastroenterology Clinical Guidelines recommend pancreatic cancer screening in PALB2 carriers be limited to those with a first- or second-degree relative affected with pancreatic cancer. Ideally, screening should be performed in experienced  centers utilizing a multidisciplinary approach under research conditions. Recommended screening includes annual endoscopic ultrasound and/or MRI of the pancreas starting at age 48 or 76  years younger than the earliest age of pancreatic cancer diagnosis in the family (PMID: 55015868).  Overall cancer risk assessment incorporates additional factors including personal medical history, family history, and any available genetic information that may result in a personalized plan for cancer prevention and surveillance.  FAMILY MEMBERS: It is important that all of Ms. Hoselton's relatives (both men and women) know of the presence of this gene mutation. Site-specific genetic testing can sort out who in the family is at risk and who is not. Knowing if a PALB2 pathogenic variant is present is advantageous. At-risk relatives can be identified, enabling pursuit of a diagnostic evaluation. Further, the available information regarding hereditary cancer susceptibility genes is constantly evolving and more clinically relevant data regarding PALB2 are likely to become available in the near future. Awareness of this cancer predisposition encourages patients and their providers to inform at-risk family members, to diligently follow recommended screening protocols, and to be vigilant in maintaining close and regular contact with their local genetics clinic in anticipation of new information.  Ms. Fede children and siblings have a 50% chance to have inherited this mutation. We recommend they have genetic testing for this same mutation, as identifying the presence of this mutation would allow them to also take advantage of risk-reducing measures.   SUPPORT AND RESOURCES: If Ms. Peltzer is interested in PALB2-information and support, there are two groups, Facing Our Risk (www.facingourrisk.com) and Bright Pink (www.brightpink.org) which some people have found useful. They provide opportunities to speak with other individuals from high-risk families. To locate genetic counselors in other cities, visit the website of the [employer] (ArtistMovie.se) and Secretary/administrator for a Social worker by  zip code.  We encouraged Ms. Marter to remain in contact with [place] on an annual basis so we can update her personal and family histories, and let her know of advances in cancer genetics that may benefit the family. Our contact number was provided. Ms. Ellerbe questions were answered to her satisfaction today, and she knows she is welcome to call anytime with additional questions.   Karen P. Florene Glen, [place], [facility] Certified Genetic Counselor [email or web address] phone: [phone]

## 2017-08-19 NOTE — Progress Notes (Signed)
[place] Social Work  Clinical Social Work was referred by patient to review and complete healthcare advance directives.  Clinical Social Worker met with patient and sisters in [place] office.  The patient designated Delaney Meigs as their primary healthcare agent and Arville Go as their secondary agent.  Patient did not complete the healthcare living will.    Clinical Social Worker notarized documents and made copies for patient/family. Clinical Social Worker will send documents to medical records to be scanned into patient's chart. Clinical Social Worker encouraged patient/family to contact with any additional questions or concerns.  Edwyna Shell, LCSW Clinical Social Worker Phone:  [phone]

## 2017-08-20 ENCOUNTER — Other Ambulatory Visit: Payer: Self-pay | Admitting: Oncology

## 2017-08-29 ENCOUNTER — Encounter: Payer: Self-pay | Admitting: Oncology

## 2017-08-29 NOTE — Progress Notes (Addendum)
Addendum:  I called and left voicemail for pt. Willeen Cass, FNP-BC [facility]Anesthesiology Phone: [phone] 08/29/2017 4:19 PM   Patient called this morning wanted to talk to an anesthesiologist about the type of medications that they will use during her surgery.  I explained that I would send a message to the anesthesia APPs and that they will be in contact with her later today.  A message has been forwarded to Wrenna Saks and allison about the patients question.

## 2017-08-30 ENCOUNTER — Ambulatory Visit (HOSPITAL_COMMUNITY): Payer: BC Managed Care – PPO | Admitting: Emergency Medicine

## 2017-08-30 ENCOUNTER — Encounter (HOSPITAL_COMMUNITY)
Admission: RE | Disposition: A | Discharge status: Home / Self Care | Payer: Self-pay | Source: Ambulatory Visit | Attending: General Surgery

## 2017-08-30 ENCOUNTER — Ambulatory Visit (HOSPITAL_COMMUNITY)
Admission: RE | Admit: 2017-08-30 | Discharge: 2017-08-30 | Disposition: A | Discharge status: Home / Self Care | Payer: BC Managed Care – PPO | Source: Ambulatory Visit | Attending: General Surgery | Admitting: General Surgery

## 2017-08-30 ENCOUNTER — Encounter (HOSPITAL_COMMUNITY): Payer: Self-pay | Admitting: Urology

## 2017-08-30 DIAGNOSIS — N6489 Other specified disorders of breast: Secondary | ICD-10-CM | POA: Insufficient documentation

## 2017-08-30 DIAGNOSIS — Z9889 Other specified postprocedural states: Secondary | ICD-10-CM | POA: Insufficient documentation

## 2017-08-30 DIAGNOSIS — Z1501 Genetic susceptibility to malignant neoplasm of breast: Secondary | ICD-10-CM | POA: Diagnosis not present

## 2017-08-30 DIAGNOSIS — Z791 Long term (current) use of non-steroidal anti-inflammatories (NSAID): Secondary | ICD-10-CM | POA: Insufficient documentation

## 2017-08-30 DIAGNOSIS — Z9221 Personal history of antineoplastic chemotherapy: Secondary | ICD-10-CM | POA: Diagnosis not present

## 2017-08-30 DIAGNOSIS — D241 Benign neoplasm of right breast: Secondary | ICD-10-CM | POA: Diagnosis not present

## 2017-08-30 DIAGNOSIS — Z853 Personal history of malignant neoplasm of breast: Secondary | ICD-10-CM | POA: Diagnosis not present

## 2017-08-30 DIAGNOSIS — Z91040 Latex allergy status: Secondary | ICD-10-CM | POA: Insufficient documentation

## 2017-08-30 DIAGNOSIS — I10 Essential (primary) hypertension: Secondary | ICD-10-CM | POA: Insufficient documentation

## 2017-08-30 DIAGNOSIS — Z8249 Family history of ischemic heart disease and other diseases of the circulatory system: Secondary | ICD-10-CM | POA: Diagnosis not present

## 2017-08-30 DIAGNOSIS — Z7982 Long term (current) use of aspirin: Secondary | ICD-10-CM | POA: Insufficient documentation

## 2017-08-30 DIAGNOSIS — C50111 Malignant neoplasm of central portion of right female breast: Secondary | ICD-10-CM

## 2017-08-30 DIAGNOSIS — Z79899 Other long term (current) drug therapy: Secondary | ICD-10-CM | POA: Diagnosis not present

## 2017-08-30 DIAGNOSIS — Z803 Family history of malignant neoplasm of breast: Secondary | ICD-10-CM | POA: Diagnosis not present

## 2017-08-30 DIAGNOSIS — C50911 Malignant neoplasm of unspecified site of right female breast: Secondary | ICD-10-CM | POA: Diagnosis present

## 2017-08-30 HISTORY — PX: BREAST LUMPECTOMY WITH RADIOACTIVE SEED AND SENTINEL LYMPH NODE BIOPSY: SHX6550

## 2017-08-30 SURGERY — BREAST LUMPECTOMY WITH RADIOACTIVE SEED AND SENTINEL LYMPH NODE BIOPSY
Anesthesia: Regional | Site: Breast | Laterality: Right

## 2017-08-30 MED ORDER — FENTANYL CITRATE (PF) 100 MCG/2ML IJ SOLN: 25.0000 ug | INTRAMUSCULAR | Status: DC | PRN

## 2017-08-30 MED ORDER — ONDANSETRON HCL 4 MG/2ML IJ SOLN
INTRAMUSCULAR | Status: AC
  Filled 2017-08-30: qty 2

## 2017-08-30 MED ORDER — GABAPENTIN 300 MG PO CAPS
ORAL_CAPSULE | ORAL | Status: AC
  Administered 2017-08-30: 300 mg via ORAL
  Filled 2017-08-30: qty 1

## 2017-08-30 MED ORDER — ACETAMINOPHEN 500 MG PO TABS
ORAL_TABLET | ORAL | Status: AC
  Administered 2017-08-30: 1000 mg via ORAL
  Filled 2017-08-30: qty 2

## 2017-08-30 MED ORDER — MIDAZOLAM HCL 2 MG/2ML IJ SOLN
INTRAMUSCULAR | Status: AC
  Filled 2017-08-30: qty 2

## 2017-08-30 MED ORDER — FENTANYL CITRATE (PF) 250 MCG/5ML IJ SOLN
INTRAMUSCULAR | Status: AC
  Filled 2017-08-30: qty 5

## 2017-08-30 MED ORDER — BUPIVACAINE-EPINEPHRINE 0.25% -1:200000 IJ SOLN
INTRAMUSCULAR | Status: DC | PRN
  Administered 2017-08-30: 5 mL

## 2017-08-30 MED ORDER — KETOROLAC TROMETHAMINE 15 MG/ML IJ SOLN
INTRAMUSCULAR | Status: AC
  Administered 2017-08-30: 15 mg via INTRAVENOUS
  Filled 2017-08-30: qty 1

## 2017-08-30 MED ORDER — DEXTROSE 5 % IV SOLN
INTRAVENOUS | Status: DC | PRN
  Administered 2017-08-30: 20 ug/min via INTRAVENOUS

## 2017-08-30 MED ORDER — ACETAMINOPHEN 325 MG PO TABS: 325.0000 mg | ORAL_TABLET | ORAL | Status: DC | PRN

## 2017-08-30 MED ORDER — PROPOFOL 10 MG/ML IV BOLUS
INTRAVENOUS | Status: DC | PRN
  Administered 2017-08-30: 30 mg via INTRAVENOUS
  Administered 2017-08-30: 120 mg via INTRAVENOUS

## 2017-08-30 MED ORDER — BUPIVACAINE-EPINEPHRINE (PF) 0.5% -1:200000 IJ SOLN
INTRAMUSCULAR | Status: DC | PRN
  Administered 2017-08-30: 30 mL via PERINEURAL

## 2017-08-30 MED ORDER — OXYCODONE HCL 5 MG PO TABS: 5.0000 mg | ORAL_TABLET | Freq: Four times a day (QID) | ORAL | 0 refills | Status: DC | PRN

## 2017-08-30 MED ORDER — KETOROLAC TROMETHAMINE 15 MG/ML IJ SOLN
15.0000 mg | INTRAMUSCULAR | Status: AC
  Administered 2017-08-30: 15 mg via INTRAVENOUS

## 2017-08-30 MED ORDER — OXYCODONE HCL 5 MG PO TABS: 5.0000 mg | ORAL_TABLET | Freq: Once | ORAL | Status: DC | PRN

## 2017-08-30 MED ORDER — CEFAZOLIN SODIUM-DEXTROSE 2-4 GM/100ML-% IV SOLN
2.0000 g | INTRAVENOUS | Status: AC
  Administered 2017-08-30: 2 g via INTRAVENOUS
  Filled 2017-08-30: qty 100

## 2017-08-30 MED ORDER — ACETAMINOPHEN 500 MG PO TABS
1000.0000 mg | ORAL_TABLET | ORAL | Status: AC
  Administered 2017-08-30: 1000 mg via ORAL

## 2017-08-30 MED ORDER — LACTATED RINGERS IV SOLN
INTRAVENOUS | Status: DC
  Administered 2017-08-30: 10:00:00 via INTRAVENOUS

## 2017-08-30 MED ORDER — SODIUM CHLORIDE 0.9 % IJ SOLN
INTRAVENOUS | Status: DC | PRN
  Administered 2017-08-30: 11:00:00

## 2017-08-30 MED ORDER — DEXAMETHASONE SODIUM PHOSPHATE 10 MG/ML IJ SOLN
INTRAMUSCULAR | Status: DC | PRN
  Administered 2017-08-30: 10 mg via INTRAVENOUS

## 2017-08-30 MED ORDER — METHYLENE BLUE 0.5 % INJ SOLN
INTRAVENOUS | Status: AC
  Filled 2017-08-30: qty 10

## 2017-08-30 MED ORDER — TECHNETIUM TC 99M SULFUR COLLOID FILTERED
1.0000 | Freq: Once | INTRAVENOUS | Status: AC | PRN
  Administered 2017-08-30: 1 via INTRADERMAL

## 2017-08-30 MED ORDER — SUGAMMADEX SODIUM 200 MG/2ML IV SOLN
INTRAVENOUS | Status: DC | PRN
  Administered 2017-08-30: 162.2 mg via INTRAVENOUS

## 2017-08-30 MED ORDER — SODIUM CHLORIDE 0.9 % IJ SOLN
INTRAMUSCULAR | Status: AC
  Filled 2017-08-30: qty 10

## 2017-08-30 MED ORDER — BUPIVACAINE-EPINEPHRINE (PF) 0.25% -1:200000 IJ SOLN
INTRAMUSCULAR | Status: AC
  Filled 2017-08-30: qty 30

## 2017-08-30 MED ORDER — ENSURE PRE-SURGERY PO LIQD
296.0000 mL | Freq: Once | ORAL | Status: DC
  Filled 2017-08-30: qty 296

## 2017-08-30 MED ORDER — PROPOFOL 500 MG/50ML IV EMUL
INTRAVENOUS | Status: DC | PRN
  Administered 2017-08-30: 50 ug/kg/min via INTRAVENOUS

## 2017-08-30 MED ORDER — 0.9 % SODIUM CHLORIDE (POUR BTL) OPTIME
TOPICAL | Status: DC | PRN
  Administered 2017-08-30: 1000 mL

## 2017-08-30 MED ORDER — CHLORHEXIDINE GLUCONATE CLOTH 2 % EX PADS: 6.0000 | MEDICATED_PAD | Freq: Once | CUTANEOUS | Status: DC

## 2017-08-30 MED ORDER — ACETAMINOPHEN 160 MG/5ML PO SOLN: 325.0000 mg | ORAL | Status: DC | PRN

## 2017-08-30 MED ORDER — ONDANSETRON HCL 4 MG/2ML IJ SOLN
INTRAMUSCULAR | Status: DC | PRN
  Administered 2017-08-30: 4 mg via INTRAVENOUS

## 2017-08-30 MED ORDER — DEXAMETHASONE SODIUM PHOSPHATE 10 MG/ML IJ SOLN
INTRAMUSCULAR | Status: AC
  Filled 2017-08-30: qty 1

## 2017-08-30 MED ORDER — FENTANYL CITRATE (PF) 100 MCG/2ML IJ SOLN
INTRAMUSCULAR | Status: AC
  Filled 2017-08-30: qty 2

## 2017-08-30 MED ORDER — ROCURONIUM BROMIDE 10 MG/ML (PF) SYRINGE
PREFILLED_SYRINGE | INTRAVENOUS | Status: AC
  Filled 2017-08-30: qty 5

## 2017-08-30 MED ORDER — MIDAZOLAM HCL 5 MG/5ML IJ SOLN
INTRAMUSCULAR | Status: DC | PRN
  Administered 2017-08-30: 2 mg via INTRAVENOUS

## 2017-08-30 MED ORDER — PROPOFOL 10 MG/ML IV BOLUS
INTRAVENOUS | Status: AC
  Filled 2017-08-30: qty 20

## 2017-08-30 MED ORDER — ROCURONIUM BROMIDE 10 MG/ML (PF) SYRINGE
PREFILLED_SYRINGE | INTRAVENOUS | Status: DC | PRN
  Administered 2017-08-30: 50 mg via INTRAVENOUS

## 2017-08-30 MED ORDER — FENTANYL CITRATE (PF) 250 MCG/5ML IJ SOLN
INTRAMUSCULAR | Status: DC | PRN
  Administered 2017-08-30: 100 ug via INTRAVENOUS

## 2017-08-30 MED ORDER — PHENYLEPHRINE 40 MCG/ML (10ML) SYRINGE FOR IV PUSH (FOR BLOOD PRESSURE SUPPORT)
PREFILLED_SYRINGE | INTRAVENOUS | Status: AC
  Filled 2017-08-30: qty 10

## 2017-08-30 MED ORDER — GABAPENTIN 300 MG PO CAPS
300.0000 mg | ORAL_CAPSULE | ORAL | Status: AC
  Administered 2017-08-30: 300 mg via ORAL

## 2017-08-30 MED ORDER — OXYCODONE HCL 5 MG/5ML PO SOLN
5.0000 mg | Freq: Once | ORAL | Status: DC | PRN
Start: 2017-08-30 — End: 2017-08-30

## 2017-08-30 MED ORDER — SODIUM CHLORIDE 0.9 % IV SOLN: INTRAVENOUS | Status: DC

## 2017-08-30 SURGICAL SUPPLY — 53 items
ADH SKN CLS APL DERMABOND .7 (GAUZE/BANDAGES/DRESSINGS) ×1
APPLIER CLIP 9.375 MED OPEN (MISCELLANEOUS) ×2
APR CLP MED 9.3 20 MLT OPN (MISCELLANEOUS) ×1
BINDER BREAST LRG (GAUZE/BANDAGES/DRESSINGS) IMPLANT
BINDER BREAST XLRG (GAUZE/BANDAGES/DRESSINGS) ×1 IMPLANT
BLADE SURG 15 STRL LF DISP TIS (BLADE) IMPLANT
BLADE SURG 15 STRL SS (BLADE)
CANISTER SUCT 3000ML PPV (MISCELLANEOUS) ×2 IMPLANT
CHLORAPREP W/TINT 26ML (MISCELLANEOUS) ×2 IMPLANT
CLIP APPLIE 9.375 MED OPEN (MISCELLANEOUS) ×1 IMPLANT
COVER PROBE W GEL 5X96 (DRAPES) ×2 IMPLANT
COVER SURGICAL LIGHT HANDLE (MISCELLANEOUS) ×2 IMPLANT
DERMABOND ADVANCED (GAUZE/BANDAGES/DRESSINGS) ×1
DERMABOND ADVANCED .7 DNX12 (GAUZE/BANDAGES/DRESSINGS) ×1 IMPLANT
DEVICE DUBIN SPECIMEN MAMMOGRA (MISCELLANEOUS) ×2 IMPLANT
DRAPE CHEST BREAST 15X10 FENES (DRAPES) ×2 IMPLANT
DRAPE UTILITY XL STRL (DRAPES) ×1 IMPLANT
ELECT COATED BLADE 2.86 ST (ELECTRODE) ×2 IMPLANT
ELECT REM PT RETURN 9FT ADLT (ELECTROSURGICAL) ×2
ELECTRODE REM PT RTRN 9FT ADLT (ELECTROSURGICAL) ×1 IMPLANT
GLOVE BIO SURGEON STRL SZ7 (GLOVE) ×2 IMPLANT
GLOVE BIOGEL PI IND STRL 7.5 (GLOVE) ×1 IMPLANT
GLOVE BIOGEL PI INDICATOR 7.5 (GLOVE) ×1
GOWN STRL REUS W/ TWL LRG LVL3 (GOWN DISPOSABLE) ×2 IMPLANT
GOWN STRL REUS W/TWL LRG LVL3 (GOWN DISPOSABLE) ×4
ILLUMINATOR WAVEGUIDE N/F (MISCELLANEOUS) IMPLANT
KIT BASIN OR (CUSTOM PROCEDURE TRAY) ×2 IMPLANT
KIT MARKER MARGIN INK (KITS) ×2 IMPLANT
MARKER SKIN DUAL TIP RULER LAB (MISCELLANEOUS) ×2 IMPLANT
NDL HYPO 25GX1X1/2 BEV (NEEDLE) ×1 IMPLANT
NDL SAFETY ECLIPSE 18X1.5 (NEEDLE) ×1 IMPLANT
NEEDLE FILTER BLUNT 18X 1/2SAF (NEEDLE) ×1
NEEDLE FILTER BLUNT 18X1 1/2 (NEEDLE) ×1 IMPLANT
NEEDLE HYPO 18GX1.5 SHARP (NEEDLE) ×2
NEEDLE HYPO 25GX1X1/2 BEV (NEEDLE) ×2 IMPLANT
NS IRRIG 1000ML POUR BTL (IV SOLUTION) ×2 IMPLANT
PACK GENERAL/GYN (CUSTOM PROCEDURE TRAY) ×2 IMPLANT
PACK SURGICAL SETUP 50X90 (CUSTOM PROCEDURE TRAY) ×1 IMPLANT
PENCIL BUTTON HOLSTER BLD 10FT (ELECTRODE) IMPLANT
SPONGE LAP 18X18 X RAY DECT (DISPOSABLE) ×1 IMPLANT
STRIP CLOSURE SKIN 1/2X4 (GAUZE/BANDAGES/DRESSINGS) ×2 IMPLANT
SUT MNCRL AB 4-0 PS2 18 (SUTURE) ×4 IMPLANT
SUT SILK 2 0 SH (SUTURE) IMPLANT
SUT VIC AB 2-0 SH 27 (SUTURE) ×4
SUT VIC AB 2-0 SH 27XBRD (SUTURE) ×2 IMPLANT
SUT VIC AB 3-0 SH 27 (SUTURE) ×4
SUT VIC AB 3-0 SH 27X BRD (SUTURE) ×2 IMPLANT
SYR BULB 3OZ (MISCELLANEOUS) IMPLANT
SYR CONTROL 10ML LL (SYRINGE) ×3 IMPLANT
TOWEL OR 17X24 6PK STRL BLUE (TOWEL DISPOSABLE) ×2 IMPLANT
TOWEL OR 17X26 10 PK STRL BLUE (TOWEL DISPOSABLE) ×2 IMPLANT
TUBE CONNECTING 12X1/4 (SUCTIONS) ×1 IMPLANT
YANKAUER SUCT BULB TIP NO VENT (SUCTIONS) IMPLANT

## 2017-08-30 NOTE — Anesthesia Procedure Notes (Signed)
Anesthesia Regional Block: Pectoralis block   Pre-Anesthetic Checklist: ,, timeout performed, Correct Patient, Correct Site, Correct Laterality, Correct Procedure, Correct Position, site marked, Risks and benefits discussed,  Surgical consent,  Pre-op evaluation,  At surgeon's request and post-op pain management  Laterality: Right  Prep: chloraprep       Needles:  Injection technique: Single-shot  Needle Type: Echogenic Stimulator Needle          Additional Needles:   Procedures:,,,, ultrasound used (permanent image in chart),,,,  Narrative:  Start time: 08/30/2017 9:52 AM End time: 08/30/2017 9:57 AM Injection made incrementally with aspirations every 5 mL.  Performed by: Personally  Anesthesiologist: Oleta Mouse, MD  Additional Notes: H+P and labs reviewed, risks and benefits discussed with patient, procedure tolerated well without complications

## 2017-08-30 NOTE — H&P (Signed)
Emily Perez is an 62 y.o. female.   Chief Complaint: breast cancer HPI: 21 yof I saw previoulsy with new diagnosis of right breast cancer she has no prior breast history. she lives in [place]. she did not have a mass or dc. she has fh of 2 sisters with breast cancer at ages 20 and 20. she has recently retired. she is here with her daughter today. she was scheduled for rc repair in a couple weeks previously she was noted to have screening detected right breast mass. she has 2.5 cm mass in retroareolar position on mm and [place]. there are 3 abnl nodes in right axillay. the node is positive on core and the breast is an at least grade II IDC that is TN she has undergone primary chemotherapy that was completed in last couple weeks. she has done well with this. her repeat mri shows her to have a complete imaging response with normal appearing nodes both in axilla and mammary nodes. she has gone to genetics and has now noted to have palb2 mutation  Past Medical History:  Diagnosis Date  . Arthritis   . Family history of breast cancer   . Family history of prostate cancer   . Headache    migraines in the past  . Hypertension   . PONV (postoperative nausea and vomiting)    after 1st colonoscopy    Past Surgical History:  Procedure Laterality Date  . COLONOSCOPY    . FOOT SURGERY Bilateral   . FRACTURE SURGERY Left    wrist  . PORTACATH PLACEMENT Right 05/19/2017   Procedure: INSERTION PORT-A-CATH WITH ULTRASOUND;  Surgeon: Rolm Bookbinder, MD;  Location: [facility];  Service: General;  Laterality: Right;  . TUBAL LIGATION    . WISDOM TOOTH EXTRACTION      Family History  Problem Relation Age of Onset  . Heart disease Mother   . Cancer Father   . Diabetes Father   . Hypertension Father   . Hyperlipidemia Father   . Heart disease Brother   . Hypertension Brother   . Hyperlipidemia Brother   . Diabetes Brother   . Prostate cancer Brother 15  . Breast cancer Sister 2  .  Breast cancer Sister 23  . Cancer Paternal Aunt        unsure what type of cancer she had  . Prostate cancer Paternal Uncle    Social History:  reports that she has never smoked. She has never used smokeless tobacco. She reports that she does not drink alcohol or use drugs.  Allergies:  Allergies  Allergen Reactions  . Latex Hives and Rash    Medications Prior to Admission  Medication Sig Dispense Refill  . aspirin 81 MG tablet Take 81 mg by mouth 3 (three) times a week.     . famotidine (PEPCID) 20 MG tablet Take 1 tablet (20 mg total) by mouth 2 (two) times daily. (Patient taking differently: Take 20 mg by mouth daily. ) 60 tablet 3  . fluocinonide cream (LIDEX) 2.99 % Apply 1 application topically 2 (two) times daily as needed (rash).    . Multiple Vitamins-Minerals (MULTIVITAMIN WITH MINERALS) tablet Take 1 tablet by mouth daily.    . naproxen sodium (ANAPROX) 550 MG tablet Take 550 mg by mouth daily as needed (pain). Takes 0.5-1 tablet as needed for pain     . OVER THE COUNTER MEDICATION Apply 1 application topically at bedtime as needed. Pain Relief Balm     . triamterene-hydrochlorothiazide (  DYAZIDE) 37.5-25 MG capsule Take 1 capsule by mouth daily.  3  . vitamin B-12 (CYANOCOBALAMIN) 1000 MCG tablet Take 500 mcg by mouth daily as needed. Takes 0.5 tablet     . meclizine (ANTIVERT) 12.5 MG tablet Take 6.25 mg by mouth 3 (three) times daily as needed for dizziness.      No results found for this or any previous visit (from the past 48 hour(s)). Nm Sentinel Node Inj-no Rpt (breast)  Result Date: 08/30/2017 Sulfur colloid was injected by the nuclear medicine technologist for melanoma sentinel node.    ROS General Present- Appetite Loss. Not Present- Chills, Fatigue, Fever, Night Sweats, Weight Gain and Weight Loss. Skin Present- Dryness and Rash. Not Present- Change in Wart/Mole, Hives, Jaundice, New Lesions, Non-Healing Wounds and Ulcer. HEENT Present- Earache, Nose Bleed and  Wears glasses/contact lenses. Not Present- Hearing Loss, Hoarseness, Oral Ulcers, Ringing in the Ears, Seasonal Allergies, Sinus Pain, Sore Throat, Visual Disturbances and Yellow Eyes. Respiratory Present- Snoring. Not Present- Bloody sputum, Chronic Cough, Difficulty Breathing and Wheezing. Breast Not Present- Breast Mass, Breast Pain, Nipple Discharge and Skin Changes. Cardiovascular Not Present- Chest Pain, Difficulty Breathing Lying Down, Leg Cramps, Palpitations, Rapid Heart Rate, Shortness of Breath and Swelling of Extremities. Gastrointestinal Not Present- Abdominal Pain, Bloating, Bloody Stool, Change in Bowel Habits, Chronic diarrhea, Constipation, Difficulty Swallowing, Excessive gas, Gets full quickly at meals, Hemorrhoids, Indigestion, Nausea, Rectal Pain and Vomiting. Female Genitourinary Present- Frequency and Urgency. Not Present- Nocturia, Painful Urination and Pelvic Pain. Musculoskeletal Present- Back Pain, Joint Pain, Joint Stiffness, Muscle Pain and Muscle Weakness. Not Present- Swelling of Extremities. Neurological Present- Trouble walking and Weakness. Not Present- Decreased Memory, Fainting, Headaches, Numbness, Seizures, Tingling and Tremor. Psychiatric Not Present- Anxiety, Bipolar, Change in Sleep Pattern, Depression, Fearful and Frequent crying. Endocrine Not Present- Cold Intolerance, Excessive Hunger, Hair Changes, Heat Intolerance, Hot flashes and New Diabetes. Hematology Present- Blood Thinners and Easy Bruising. Not Present- Excessive bleeding, Gland problems, HIV and Persistent Infections.  Blood pressure 123/66, pulse 79, temperature 98 F (36.7 C), temperature source Oral, resp. rate 17, height 5' 3.5" (1.613 m), weight 81.1 kg (178 lb 12.7 oz), SpO2 100 %. Physical Exam  General Mental Status-Alert. Orientation-Oriented X3. Chest and Lung Exam Chest and lung exam reveals -quiet, even and easy respiratory effort with no use of accessory muscles and on  auscultation, normal breath sounds, no adventitious sounds and normal vocal resonance. Note: right ij port in place Breast Nipples-No Discharge. Breast Lump-No Palpable Breast Mass. Cardiovascular Cardiovascular examination reveals -normal heart sounds, regular rate and rhythm with no murmurs. Lymphatic Head & Neck General Head & Neck Lymphatics: Bilateral - Description - Normal. Axillary General Axillary Region: Bilateral - Description - Normal. Note: no [place] adenopathy  Assessment/Plan CANCER OF CENTRAL PORTION OF BREAST (C50.119) Story: Right breast seed guided lumpectomy, right targeted axillary node dissection we discussed nodal surgery. she had 3 abnl at dx and now are clinically and radiologically normal. she would like to consider tad. will place seed in prior biopsied node and do sentinel node. I will check in OR and if path sure they are positive I will proceed with alnd. she knows drain will be placed and she will stay overnight I think with this response she is candidate for attempt at lumpectomy. I dont think it invovled her nac before so this can remain. discussed seed guided lumpectomy. we discussed that she has chance of pos margin requiring second surgery. discussed palb2 and she has also discussed with  genetics and oncology. will do intensive screening not risk reducing surgery  Rolm Bookbinder, MD 08/30/2017, 9:57 AM

## 2017-08-30 NOTE — Anesthesia Preprocedure Evaluation (Signed)
Anesthesia Evaluation  Patient identified by MRN, date of birth, ID band Patient awake    Reviewed: Allergy & Precautions, NPO status , Patient's Chart, lab work & pertinent test results  History of Anesthesia Complications (+) PONV and history of anesthetic complications  Airway Mallampati: I  TM Distance: >3 FB Neck ROM: Full    Dental  (+) Dental Advisory Given   Pulmonary neg pulmonary ROS,    breath sounds clear to auscultation       Cardiovascular hypertension, Pt. on medications + Peripheral Vascular Disease   Rhythm:Regular Rate:Normal  ECG: NSR, rate 73  ECHO: LV EF: 55% - 60%   Neuro/Psych  Headaches, negative psych ROS   GI/Hepatic negative GI ROS, Neg liver ROS,   Endo/Other  negative endocrine ROS  Renal/GU negative Renal ROS     Musculoskeletal  (+) Arthritis ,   Abdominal (+) + obese,   Peds  Hematology negative hematology ROS (+)   Anesthesia Other Findings breast cancer  Reproductive/Obstetrics                             Anesthesia Physical Anesthesia Plan  ASA: II  Anesthesia Plan: General and Regional   Post-op Pain Management:  Regional for Post-op pain   Induction:   PONV Risk Score and Plan: 4 or greater and Ondansetron, Dexamethasone and Propofol infusion  Airway Management Planned: LMA and Oral ETT  Additional Equipment: None  Intra-op Plan:   Post-operative Plan: Extubation in OR  Informed Consent: I have reviewed the patients History and Physical, chart, labs and discussed the procedure including the risks, benefits and alternatives for the proposed anesthesia with the patient or authorized representative who has indicated his/her understanding and acceptance.   Dental advisory given  Plan Discussed with: CRNA and Surgeon  Anesthesia Plan Comments:         Anesthesia Quick Evaluation

## 2017-08-30 NOTE — Transfer of Care (Signed)
Immediate Anesthesia Transfer of Care Note  Patient: Emily Perez  Procedure(s) Performed: RIGHT BREAST RADIOACTIVE SEED GUIDED LUMPECTOMY WITH RIGHT RADIOACTIVE SEED TARGETED AXILLARY LYMPH NODE EXCISION AND RIGHT SENTINEL LYMPH NODE BIOPSY (Right Breast)  Patient Location: PACU  Anesthesia Type:General and GA combined with regional for post-op pain  Level of Consciousness: awake, alert  and drowsy  Airway & Oxygen Therapy: Patient Spontanous Breathing and Patient connected to nasal cannula oxygen  Post-op Assessment: Report given to RN and Post -op Vital signs reviewed and stable  Post vital signs: Reviewed and stable  Last Vitals:  Vitals Value Taken Time  BP 120/69 08/30/2017 12:02 PM  Temp 36.5 C 08/30/2017 12:02 PM  Pulse 82 08/30/2017 12:02 PM  Resp 18 08/30/2017 12:02 PM  SpO2 98 % 08/30/2017 12:02 PM    Last Pain:  Vitals:   08/30/17 0749  TempSrc: Oral  PainSc: 0-No pain         Complications: No apparent anesthesia complications

## 2017-08-30 NOTE — Interval H&P Note (Signed)
History and Physical Interval Note:  08/30/2017 9:59 AM  Emily Perez  has presented today for surgery, with the diagnosis of RIGHT BREAST CANCER  The various methods of treatment have been discussed with the patient and family. After consideration of risks, benefits and other options for treatment, the patient has consented to  Procedure(s): RIGHT BREAST RADIOACTIVE SEED GUIDED LUMPECTOMY WITH RIGHT RADIOACTIVE SEED TARGETED AXILLARY LYMPH NODE EXCISION AND RIGHT SENTINEL LYMPH NODE BIOPSY (Right) as a surgical intervention .  The patient's history has been reviewed, patient examined, no change in status, stable for surgery.  I have reviewed the patient's chart and labs.  Questions were answered to the patient's satisfaction.     Rolm Bookbinder

## 2017-08-30 NOTE — Anesthesia Procedure Notes (Signed)
Procedure Name: Intubation Date/Time: 08/30/2017 10:15 AM Performed by: Renato Shin, CRNA Pre-anesthesia Checklist: Patient identified, Emergency Drugs available, Suction available and Patient being monitored Patient Re-evaluated:Patient Re-evaluated prior to induction Oxygen Delivery Method: Circle system utilized Preoxygenation: Pre-oxygenation with 100% oxygen Induction Type: IV induction Ventilation: Mask ventilation without difficulty Laryngoscope Size: Miller and 2 Grade View: Grade I Tube type: Oral Tube size: 7.0 mm Number of attempts: 1 Airway Equipment and Method: Stylet Placement Confirmation: ETT inserted through vocal cords under direct vision,  positive ETCO2,  CO2 detector and breath sounds checked- equal and bilateral Secured at: 21 cm Tube secured with: Tape Dental Injury: Teeth and Oropharynx as per pre-operative assessment

## 2017-08-30 NOTE — Discharge Instructions (Signed)
[facility] Office Phone Number [phone]  POST OP INSTRUCTIONS Take tylenol 650 mg every six hours or ibuprofen 400 mg every 8 hours for next 3 days then as needed. Apply ice to breast and under arm today and daily for next 3 days.  Always review your discharge instruction sheet given to you by the facility where your surgery was performed.  IF YOU HAVE DISABILITY OR FAMILY LEAVE FORMS, YOU MUST BRING THEM TO THE OFFICE FOR PROCESSING.  DO NOT GIVE THEM TO YOUR DOCTOR.  1. A prescription for pain medication may be given to you upon discharge.  Take your pain medication as prescribed, if needed.  If narcotic pain medicine is not needed, then you may take acetaminophen (Tylenol), naprosyn (Alleve) or ibuprofen (Advil) as needed. 2. Take your usually prescribed medications unless otherwise directed 3. If you need a refill on your pain medication, please contact your pharmacy.  They will contact our office to request authorization.  Prescriptions will not be filled after 5pm or on week-ends. 4. You should eat very light the first 24 hours after surgery, such as soup, crackers, pudding, etc.  Resume your normal diet the day after surgery. 5. Most patients will experience some swelling and bruising in the breast.  Ice packs and a good support bra will help.  Wear the breast binder provided or a sports bra for 72 hours day and night.  After that wear a sports bra during the day until you return to the office. Swelling and bruising can take several days to resolve.  6. It is common to experience some constipation if taking pain medication after surgery.  Increasing fluid intake and taking a stool softener will usually help or prevent this problem from occurring.  A mild laxative (Milk of Magnesia or Miralax) should be taken according to package directions if there are no bowel movements after 48 hours. 7. Unless discharge instructions indicate otherwise, you may remove your bandages 48  hours after surgery and you may shower at that time.  You may have steri-strips (small skin tapes) in place directly over the incision.  These strips should be left on the skin for 7-10 days and will come off on their own.  If your surgeon used skin glue on the incision, you may shower in 24 hours.  The glue will flake off over the next 2-3 weeks.  Any sutures or staples will be removed at the office during your follow-up visit. 8. ACTIVITIES:  You may resume regular daily activities (gradually increasing) beginning the next day.  Wearing a good support bra or sports bra minimizes pain and swelling.  You may have sexual intercourse when it is comfortable. a. You may drive when you no longer are taking prescription pain medication, you can comfortably wear a seatbelt, and you can safely maneuver your car and apply brakes. b. RETURN TO WORK:  ______________________________________________________________________________________ 9. You should see your doctor in the office for a follow-up appointment approximately two weeks after your surgery.  Your doctors nurse will typically make your follow-up appointment when she calls you with your pathology report.  Expect your pathology report 3-4 business days after your surgery.  You may call to check if you do not hear from [place] after three days. 10. OTHER INSTRUCTIONS: _______________________________________________________________________________________________ _____________________________________________________________________________________________________________________________________ _____________________________________________________________________________________________________________________________________ _____________________________________________________________________________________________________________________________________  WHEN TO CALL DR Bertrand Vowels: 1. Fever over 101.0 2. Nausea and/or vomiting. 3. Extreme swelling or  bruising. 4. Continued bleeding from incision. 5. Increased pain, redness, or drainage from the  incision.  The clinic staff is available to answer your questions during regular business hours.  Please dont hesitate to call and ask to speak to one of the nurses for clinical concerns.  If you have a medical emergency, go to the nearest emergency room or call 911.  A surgeon from [facility] is always on call at the hospital.  For further questions, please visit [email or web address]

## 2017-08-30 NOTE — Anesthesia Postprocedure Evaluation (Signed)
Anesthesia Post Note  Patient: Emily Perez  Procedure(s) Performed: RIGHT BREAST RADIOACTIVE SEED GUIDED LUMPECTOMY WITH RIGHT RADIOACTIVE SEED TARGETED AXILLARY LYMPH NODE EXCISION AND RIGHT SENTINEL LYMPH NODE BIOPSY (Right Breast)     Patient location during evaluation: PACU Anesthesia Type: Regional and General Level of consciousness: awake and alert, awake and oriented Pain management: pain level controlled Vital Signs Assessment: post-procedure vital signs reviewed and stable Respiratory status: spontaneous breathing, nonlabored ventilation and respiratory function stable Cardiovascular status: blood pressure returned to baseline and stable Postop Assessment: no apparent nausea or vomiting Anesthetic complications: no    Last Vitals:  Vitals:   08/30/17 1230 08/30/17 1245  BP: 130/76 (!) 146/76  Pulse: 69 64  Resp: 15 17  Temp: 36.6 C   SpO2: 99% 96%    Last Pain:  Vitals:   08/30/17 1245  TempSrc:   PainSc: 0-No pain                 Catalina Gravel

## 2017-08-30 NOTE — Op Note (Addendum)
Preoperative diagnosis:Right breast cancer s/p primary chemotherapy Postoperative diagnosis: same as above Procedure:  RIght breast seed guided lumpectomy Rightdeep axillary sentinel node biopsy Right seed guided axillary node excision (right axillary targeted node dissection) Injection blue dye for sentinel node identification Surgeon: Dr Serita Grammes RFX:[id number] Anes: general  Specimens  1.rightbreast tissue marked with paint 2.right axillary sentinel nodes with highest count 324 3. right axillary seed containing tissue Complications none Drains none Sponge count correct Dispo to pacu stable  Indications: This is a26 yof who had primary chemotherapy for node positive right breast cancer. Her repeat mri shows complete resolution in both the breast and nodes. 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 We discussed all of her options and have elected to proceed with intensive screening and surgically will proceed with lumpectomy and TAD, will check nodes intraop and if positive proceed with ALND.  Procedure: After informed consent was obtained the patient was taken to the operating room. She first was given technetium in standard periareolar fashion. She had a pectoral block. She was given antibiotics. Sequential compression devices were on her legs. She was then placed under general anesthesia with an LMA. Then she was prepped and draped in the standard sterile surgical fashion. Surgical timeout was then performed.  I infiltrated a mixture of blue dye and saline in the periareolar tissue and massaged this. This was done for sentinel node identification. I then located the seed in thelower outer quadrant very near the areolar border. I infiltrated marcaine and made a periareolar incision to hide the scar. I then used the neoprobe to remove the seed and the surrounding tissue with attempt to get clear margins. I marked this with paint. MM confirmed removal of seed and  theclip.I placed clips in the cavity.I then obtained hemostasis. This was marked as above.I then closed with 2-0 vicryl, 3-0 vicryl and 4-0 monocryl. dermabond and steristrips were applied. I infiltrated marcaine and made a curvilinear incision below the axillary hairling.Icarried this through the axillary fascia via the same incision. I identified the radioactive seed and removed this tissue. This appeared to just be in the soft tissue.  There was radioactivity present that was easily noted I removed the radioactive nodes that had technetium. a couple of these appeared to have some blue dye as well. The background radioactivity was minimal.I sent all of this tissue to pathology. They located 7 nodes and all these were negative.  I did not identify the prior clip but feel satisfied that I retrieved an appropriate specimen and there clearly treated nodes in the specimen. I then obtained hemostasis. I closed the axillary fascia with 2-0 vicryl.The skin was closed with 3-0 vicryl and 4-0 monocryl. Glue and steristrips were placed. She was extubated in or and transferred to pacu stable.

## 2017-08-31 ENCOUNTER — Encounter (HOSPITAL_COMMUNITY): Payer: Self-pay | Admitting: General Surgery

## 2017-09-04 NOTE — Progress Notes (Signed)
No show

## 2017-09-05 ENCOUNTER — Encounter: Payer: Self-pay | Admitting: Oncology

## 2017-09-05 ENCOUNTER — Inpatient Hospital Stay: Payer: BC Managed Care – PPO | Admitting: Oncology

## 2017-09-06 ENCOUNTER — Telehealth: Payer: Self-pay | Admitting: Oncology

## 2017-09-06 NOTE — Telephone Encounter (Signed)
Patient called to reschedule  °

## 2017-09-06 NOTE — Telephone Encounter (Signed)
Pt called in to r/s - called patient back and no answer - left message for pt to call back and r/s

## 2017-09-07 ENCOUNTER — Telehealth: Payer: Self-pay | Admitting: Oncology

## 2017-09-07 NOTE — Progress Notes (Signed)
Location of Breast Cancer: Right breast  Histology per Pathology Report: 05/02/17:  1. Breast, right, needle core biopsy - INVASIVE DUCTAL CARCINOMA. - SEE COMMENT. 2. Lymph node, needle/core biopsy, right - METASTATIC CARCINOMA IN 1 OF 1 LYMPH NODE (1/1). - SEE COMMENT. Microscopic Comment 1. and 2. The carcinoma appears at least grade II.  Receptor Status: ER(0%), PR (0%), Her2-neu (negative)  Did patient present with symptoms (if so, please note symptoms) or was this found on screening mammography?: had routine bilateral screening mammography at [facility] on 04/19/2017 showing a possible abnormality in the right breast    Past/Anticipated interventions by surgeon, if any: 08/30/2017:Procedure: RIght breast seed guided lumpectomy Rightdeep axillary sentinel node biopsy Right seed guided axillary node excision (right axillary targeted node dissection) Injection blue dye for sentinel node identification Surgeon: Dr Serita Grammes     Past/Anticipated interventions by medical oncology, if any: Chemotherapy Dr. Jana Hakim: (1) neoadjuvant chemotherapy consisting of doxorubicin and cyclophosphamide in dose dense fashion x4 starting 05/24/2017, completed 07/05/2017             (a) planned carboplatin/paclitaxel x12 omitted secondary to peripheral neuropathy concerns    Lymphedema issues, if any:  Pt is complaining of discomfort in lymph node area from surgical removal.    Pain issues, if any:  Pt describes pain as "discomfort" in area of lymph node removal. Pt endorses mild swelling.   SAFETY ISSUES:  Prior radiation? No  Pacemaker/ICD? No  Possible current pregnancy? No  Is the patient on methotrexate? No  Current Complaints / other details:  Pt has experienced a loss of energy recently and a lack of interest in daily tasks. SW consult has been ordered.  Pt is here for initial consult for Radiation Oncology with Dr. Sondra Come. Pt is unaccompanied. Role of SW has been explained to  pt. Pt verbalized understanding.     Loma Sousa, RN 09/14/2017,2:53 PM

## 2017-09-07 NOTE — Telephone Encounter (Signed)
Called pt re appts that were added per 6/18 sch msg - spoke w/ pt re appts.

## 2017-09-13 ENCOUNTER — Other Ambulatory Visit: Payer: BC Managed Care – PPO

## 2017-09-13 ENCOUNTER — Other Ambulatory Visit: Payer: Self-pay

## 2017-09-13 DIAGNOSIS — Z171 Estrogen receptor negative status [ER-]: Secondary | ICD-10-CM

## 2017-09-13 DIAGNOSIS — C50511 Malignant neoplasm of lower-outer quadrant of right female breast: Secondary | ICD-10-CM

## 2017-09-14 ENCOUNTER — Ambulatory Visit
Admission: RE | Admit: 2017-09-14 | Discharge: 2017-09-14 | Disposition: A | Discharge status: Home / Self Care | Payer: BC Managed Care – PPO | Source: Ambulatory Visit | Attending: Radiation Oncology | Admitting: Radiation Oncology

## 2017-09-14 ENCOUNTER — Encounter: Payer: Self-pay | Admitting: Radiation Oncology

## 2017-09-14 ENCOUNTER — Other Ambulatory Visit: Payer: Self-pay

## 2017-09-14 VITALS — BP 126/84 | HR 87 | Temp 98.5°F | Resp 20 | Ht 63.5 in | Wt 175.4 lb

## 2017-09-14 DIAGNOSIS — C50511 Malignant neoplasm of lower-outer quadrant of right female breast: Secondary | ICD-10-CM

## 2017-09-14 DIAGNOSIS — Z171 Estrogen receptor negative status [ER-]: Secondary | ICD-10-CM

## 2017-09-14 NOTE — Progress Notes (Signed)
[place]  [phone] [phone]) [phone]     ID: TANIJAH MORAIS DOB: 09/30/1955  MR#: [id number]  VEH#:[id number]  Patient Care Team: Shirline Frees, MD as PCP - General (Family Medicine) Luisa Louk, Virgie Dad, MD as Consulting Physician (Oncology) Rolm Bookbinder, MD as Consulting Physician (General Surgery) Gery Pray, MD as Consulting Physician (Radiation Oncology) Allyn Kenner, MD (Dermatology) Thurnell Lose, MD as Consulting Physician (Obstetrics and Gynecology) Marchia Bond, MD as Consulting Physician (Orthopedic Surgery) Christene Slates, MD as Physician Assistant (Radiology) OTHER MD:  CHIEF COMPLAINT: Triple negative breast cancer; PALB2 mutation  CURRENT TREATMENT: Adjuvant radiation pending  INTERVAL HISTORY: Lyrik returns today for follow up and treatment of her triple negative breast cancer. Since her last visit, she underwent right lumpectomy with sentinel lymph node sampling ([id number]) on 08/30/2017 showing benign breast parenchyma with treatment related changes. There was no evidence of malignancy. 5 right axillary sentinel lymph nodes were negative for carcinoma.  She denies any bleeding or fever. She took tylenol for pain as needed. She feels some discomfort in the right breast. She met with Dr. Sondra Come on 09/14/2017, and the plan is to start radiation approximately 6 weeks after surgery.   She also completed genetic testing on 08/03/2017 showing a pathogenic variant identified in [place], and a VUS identified in [place].   REVIEW OF SYSTEMS: Imojean reports that she has been running errands and cleaning her home. She is taking it easy. She denies unusual headaches, visual changes, nausea, vomiting, or dizziness. There has been no unusual cough, phlegm production, or pleurisy. This been no change in bowel or bladder habits. She denies unexplained fatigue or unexplained weight loss, bleeding, rash, or fever. A detailed review of systems  was otherwise stable.    HISTORY OF CURRENT ILLNESS: IISHA SOYARS had routine bilateral screening mammography at [facility] on 04/19/2017 showing a possible abnormality in the right breast. She underwent unilateral right diagnostic mammography with tomography and right breast ultrasonography at [facility] on 04/25/2017 showing: breast density category B.  In the right breast at the 6:00 radiant 2 cm from the nipple there was a 2 cm oval mass which by ultrasound measured 2.5 cm and was irregular and hypoechoic.  There were also at least 3 abnormal appearing lymph nodes in the right axilla.  Accordingly on 05/02/2017 she proceeded to biopsy of the right breast mass in question and lymph node sampling. The pathology from this procedure showed ([id number]): Invasive ductal carcinoma grade II. One lymph node positive for metastatic carcinoma (1/1). Prognostic indicators significant for: estrogen receptor, 0% negative and progesterone receptor, 0% negative. Proliferation marker Ki67 at 80%. HER2 not amplified with ratios HER2/CEP17 signals 1.29 and average copies per cell 2.25  The patient's subsequent history is as detailed below.    PAST MEDICAL HISTORY: Past Medical History:  Diagnosis Date  . Arthritis   . Family history of breast cancer   . Family history of prostate cancer   . Headache    migraines in the past  . Hypertension   . PONV (postoperative nausea and vomiting)    after 1st colonoscopy  Migraines in the past.   PAST SURGICAL HISTORY: Past Surgical History:  Procedure Laterality Date  . BREAST LUMPECTOMY WITH RADIOACTIVE SEED AND SENTINEL LYMPH NODE BIOPSY Right 08/30/2017   Procedure: RIGHT BREAST RADIOACTIVE SEED GUIDED LUMPECTOMY WITH RIGHT RADIOACTIVE SEED TARGETED AXILLARY LYMPH NODE EXCISION AND RIGHT SENTINEL LYMPH NODE BIOPSY;  Surgeon: Rolm Bookbinder, MD;  Location: [place];  Service: General;  Laterality: Right;  . COLONOSCOPY    . FOOT SURGERY Bilateral   . FRACTURE  SURGERY Left    wrist  . PORTACATH PLACEMENT Right 05/19/2017   Procedure: INSERTION PORT-A-CATH WITH ULTRASOUND;  Surgeon: Rolm Bookbinder, MD;  Location: [place];  Service: General;  Laterality: Right;  . TUBAL LIGATION    . WISDOM TOOTH EXTRACTION      Wrist Fracture. Torn Rotator cuff and torn meniscus.    FAMILY HISTORY Family History  Problem Relation Age of Onset  . Heart disease Mother   . Cancer Father   . Diabetes Father   . Hypertension Father   . Hyperlipidemia Father   . Heart disease Brother   . Hypertension Brother   . Hyperlipidemia Brother   . Diabetes Brother   . Prostate cancer Brother 74  . Breast cancer Sister 80  . Breast cancer Sister 23  . Cancer Paternal Aunt        unsure what type of cancer she had  . Prostate cancer Paternal Uncle   The patient's father died at age 68 due to heart disease and emphysema. The patient's mother died at age 24 due to heart disease. The patient has 6 brothers and 8 sisters. She notes 2 sisters with breast cancer. The 1st sister was diagnosed at age 52, and the 2nd sister was diagnosed at age 25. She notes that both sisters are alive. She also notes a paternal first cousin with breast cancer diagnosed in her 68's. The patient notes that her father also had bone cancer, but she's unsure if he had myeloma. She denies a family history of ovarian cancer.   GYNECOLOGIC HISTORY:  No LMP recorded. Patient is postmenopausal. 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 She is divorced. She lives by herself with no pets. The patient's daughter, [name], works in Therapist, art for [employer]. The patient's son, [name],  works for [employer]. The patient has  2 grandchildren. She belongs to [facility].      ADVANCED DIRECTIVES: Not in place.   At the 05/19/2017 visit the patient was given the appropriate documents to complete and notarized at her discretion   HEALTH MAINTENANCE: Social History   Tobacco Use  . Smoking status: Never Smoker  . Smokeless tobacco: Never Used  Substance Use Topics  . Alcohol use: No  . Drug use: No     Colonoscopy:   PAP: September 2018 normal  Bone density: none   Allergies  Allergen Reactions  . Latex Hives and Rash    Current Outpatient Medications  Medication Sig Dispense Refill  . aspirin 81 MG tablet Take 81 mg by mouth 3 (three) times a week.     . famotidine (PEPCID) 20 MG tablet Take 1 tablet (20 mg total) by mouth 2 (two) times daily. (Patient taking differently: Take 20 mg by mouth daily. ) 60 tablet 3  . fluocinonide cream (LIDEX) 0.24 % Apply 1 application topically 2 (two) times daily as needed (rash).    . meclizine (ANTIVERT) 12.5 MG tablet Take 6.25 mg by mouth 3 (three) times daily as needed for dizziness.    . Multiple Vitamins-Minerals (MULTIVITAMIN WITH MINERALS) tablet Take 1 tablet by mouth daily.    . naproxen sodium (ANAPROX) 550 MG tablet Take 550 mg by mouth  daily as needed (pain). Takes 0.5-1 tablet as needed for pain     . OVER THE COUNTER MEDICATION Apply 1 application topically at bedtime as needed. Pain Relief Balm     . oxyCODONE (OXY IR/ROXICODONE) 5 MG immediate release tablet Take 1 tablet (5 mg total) by mouth every 6 (six) hours as needed for moderate pain, severe pain or breakthrough pain. (Patient not taking: Reported on 09/14/2017) 10 tablet 0  . oxyCODONE (OXY IR/ROXICODONE) 5 MG immediate release tablet Take 1 tablet (5 mg total) by mouth every 6 (six) hours as needed for moderate pain, severe pain or breakthrough pain. (Patient not taking: Reported on 09/14/2017) 10 tablet 0  . triamterene-hydrochlorothiazide (DYAZIDE) 37.5-25 MG capsule Take 1 capsule by mouth daily.  3  . vitamin B-12 (CYANOCOBALAMIN) 1000 MCG tablet Take 500 mcg by mouth daily as  needed. Takes 0.5 tablet      No current facility-administered medications for this visit.     OBJECTIVE: Middle-aged African-American woman in no acute distress  There were no vitals filed for this visit.   There is no height or weight on file to calculate BMI.   Wt Readings from Last 3 Encounters:  09/14/17 175 lb 6.4 oz (79.6 kg)  08/30/17 178 lb 12.7 oz (81.1 kg)  08/17/17 178 lb 12.7 oz (81.1 kg)   ECOG FS:0 - Asymptomatic   Sclerae unicteric, pupils round and equal Oropharynx clear and moist No cervical or supraclavicular adenopathy Lungs no rales or rhonchi Heart regular rate and rhythm Abd soft, nontender, positive bowel sounds MSK no focal spinal tenderness, no upper extremity lymphedema Neuro: nonfocal, well oriented, appropriate affect Breasts: Status post right lumpectomy.  The cosmetic result is excellent.  There is no dehiscence, or erythema.  There is no suspicious finding.  Left breast is benign.  Both axillae are benign.    LAB RESULTS:  CMP     Component Value Date/Time   NA 140 08/17/2017 1329   K 3.7 08/17/2017 1329   CL 104 08/17/2017 1329   CO2 28 08/17/2017 1329   GLUCOSE 93 08/17/2017 1329   BUN 23 (H) 08/17/2017 1329   CREATININE 1.22 (H) 08/17/2017 1329   CREATININE 1.38 (H) 08/03/2017 1114   CALCIUM 9.3 08/17/2017 1329   PROT 7.7 08/03/2017 1114   ALBUMIN 4.1 08/03/2017 1114   AST 30 08/03/2017 1114   ALT 31 08/03/2017 1114   ALKPHOS 76 08/03/2017 1114   BILITOT 0.5 08/03/2017 1114   GFRNONAA 46 (L) 08/17/2017 1329   GFRNONAA 40 (L) 08/03/2017 1114   GFRAA 53 (L) 08/17/2017 1329   GFRAA 46 (L) 08/03/2017 1114    No results found for: TOTALPROTELP, ALBUMINELP, A1GS, A2GS, BETS, BETA2SER, GAMS, MSPIKE, SPEI  No results found for: KPAFRELGTCHN, LAMBDASER, KAPLAMBRATIO  Lab Results  Component Value Date   WBC 6.1 09/16/2017   NEUTROABS 3.7 09/16/2017   HGB 10.1 (L) 09/16/2017   HCT 31.3 (L) 09/16/2017   MCV 82.9 09/16/2017   PLT  206 09/16/2017    _0 @  No results found for: LABCA2  No components found for: KWIOXB353  No results for input(s): INR in the last 168 hours.  No results found for: LABCA2  No results found for: GDJ242  No results found for: AST419  No results found for: QQI297  No results found for: CA2729  No components found for: HGQUANT  No results found for: CEA1 / No results found for: CEA1   No results found for: AFPTUMOR  No  results found for: CHROMOGRNA  No results found for: PSA1  Appointment on 09/16/2017  Component Date Value Ref Range Status  . WBC Count 09/16/2017 6.1  3.9 - 10.3 K/uL Final  . RBC 09/16/2017 3.78  3.70 - 5.45 MIL/uL Final  . Hemoglobin 09/16/2017 10.1* 11.6 - 15.9 g/dL Final  . HCT 09/16/2017 31.3* 34.8 - 46.6 % Final  . MCV 09/16/2017 82.9  79.5 - 101.0 fL Final  . MCH 09/16/2017 26.8  25.1 - 34.0 pg Final  . MCHC 09/16/2017 32.3  31.5 - 36.0 g/dL Final  . RDW 09/16/2017 15.9* 11.2 - 14.5 % Final  . Platelet Count 09/16/2017 206  145 - 400 K/uL Final  . Neutrophils Relative % 09/16/2017 61  % Final  . Neutro Abs 09/16/2017 3.7  1.5 - 6.5 K/uL Final  . Lymphocytes Relative 09/16/2017 25  % Final  . Lymphs Abs 09/16/2017 1.5  0.9 - 3.3 K/uL Final  . Monocytes Relative 09/16/2017 10  % Final  . Monocytes Absolute 09/16/2017 0.6  0.1 - 0.9 K/uL Final  . Eosinophils Relative 09/16/2017 3  % Final  . Eosinophils Absolute 09/16/2017 0.2  0.0 - 0.5 K/uL Final  . Basophils Relative 09/16/2017 1  % Final  . Basophils Absolute 09/16/2017 0.0  0.0 - 0.1 K/uL Final   Performed at [facility], [place] [address], [place], [place] [id number]    (this displays the last labs from the last 3 days)  No results found for: TOTALPROTELP, ALBUMINELP, A1GS, A2GS, BETS, BETA2SER, GAMS, MSPIKE, SPEI (this displays SPEP labs)  No results found for: KPAFRELGTCHN, LAMBDASER, KAPLAMBRATIO (kappa/lambda light chains)  No results found for:  HGBA, HGBA2QUANT, HGBFQUANT, HGBSQUAN (Hemoglobinopathy evaluation)   No results found for: LDH  Lab Results  Component Value Date   IRON 170 (H) 07/12/2017   TIBC 303 07/12/2017   IRONPCTSAT 56 07/12/2017   (Iron and TIBC)  Lab Results  Component Value Date   FERRITIN 685 (H) 07/12/2017    Urinalysis No results found for: COLORURINE, APPEARANCEUR, LABSPEC, PHURINE, GLUCOSEU, HGBUR, BILIRUBINUR, KETONESUR, PROTEINUR, UROBILINOGEN, NITRITE, LEUKOCYTESUR   STUDIES: Nm Sentinel Node Inj-no Rpt (breast)  Result Date: 08/30/2017 Sulfur colloid was injected by the nuclear medicine technologist for melanoma sentinel node.    ELIGIBLE FOR AVAILABLE RESEARCH PROTOCOL: no  ASSESSMENT: 62 y.o. [place] Woman status post right breast upper outer quadrant biopsy 05/02/2017 for a clinical T2 N1-2, stage IIIB invasive ductal carcinoma, grade 2-3, triple negative, with an MIB-1 80%  (a) breast MRI 05/18/2017 shows T3 N1 disease with possible involvement of the internal mammary nodes  (1) neoadjuvant chemotherapy consisting of doxorubicin and cyclophosphamide in dose dense fashion x4 starting 05/24/2017, completed 07/05/2017  (a) planned carboplatin/paclitaxel x12 omitted secondary to peripheral neuropathy concerns  (2) status post right lumpectomy 08/30/2017 showing a complete pathologic response (ypT0 ypN0)  (a) a total of 5 lymph nodes were removed  (3) adjuvant radiation to follow.  (4) Genetics testing offered through [facility] Multi-cancer Panel on 08/03/2017 showed a pathogenic variant in [place] ([id number] (p.[id number]*7)  (a) there was a VUS identified in [facility]1  (b) no additional deleterious mutations were noted in ALK, APC, ATM, AXIN2, BAP1, BARD1, BLM, BMPR1A, BRCA1, BRCA2, BRIP1, CASR, CDC73, CDH1, CDK4, CDKN1B, CDKN1C, CDKN2A (p14ARF), CDKN2A (p16INK4a), CEBPA, CHEK2, CTNNA1, DICER1, DIS3L2, EPCAM*, FH, FLCN, GATA2, GPC3, GREM1*, HRAS, KIT, MAX, MEN1, MET, MLH1, MSH2, MSH3,  MSH6, MUTYH, NBN, NF1, NF2, PALB2, PDGFRA, PHOX2B*, PMS2, POLD1, POLE, POT1, PRKAR1A, PTCH1, PTEN,  RAD50, RAD51C, RAD51D, RB1, RECQL4, RET, RUNX1, SDHAF2, SDHB, SDHC, SDHD, SMAD4, SMARCA4, SMARCB1, SMARCE1, STK11, SUFU, TERC, TERT, TMEM127, TP53, TSC1, TSC2, VHL, WRN*, WT1. The following genes were evaluated for sequence changes only: EGFR*, HOXB13*, MITF*, NTHL1*, SDHA Results are negative unless otherwise indicated  (c) per NCCN guidelines, PALB2 mutations are not associated with increased ovarian cancer risk  (5) PALB2: intensified screening:  (a) mammography with tomography every November  (b) breast MRI every May  (6) PALB2: breast cancer prophylaxis: To start October 2019  PLAN: I reviewed the pathology with Kellin and she understands patients like her to have a complete pathologic response also have a very good long-term prognosis.  This is a very favorable outcome.  She understands the need for adjuvant radiation.  This likely will start mid to late July and go through most of August.  We discussed lymphedema prevention.  She has already been referred to physical therapy.  She understands she needs to avoid using the right breast for blood pressure blood draws and other possible minor traumatic procedures  We then discussed the PALB 2 mutation.  She understands that increases her risk of developing another breast cancer in the future.  She can cut the risk in half by taking antiestrogens.  Today I gave her information on the difference between tamoxifen and anastrozole and we will review this when she returns to see me in October at which time she will tell me which one she would like to start.  Her next mammogram will be in November.  I have suggested that she participate in our next "finding her new normal" program  She knows to call for any other issues that may develop before the next visit.   I, Lurline Del MD, have reviewed the above documentation for accuracy and  completeness, and I agree with the above.

## 2017-09-14 NOTE — Progress Notes (Signed)
Radiation Oncology         [phone] ________________________________  Initial Outpatient Re-Consultation  Name: Emily Perez MRN: [id number]  Date: 09/14/2017  DOB: 11/02/1955  OJ:[id number], Emily Saxon, Emily Perez  Magrinat, Emily Dad, Emily Perez   REFERRING PHYSICIAN: Magrinat, Emily Dad, Emily Perez  DIAGNOSIS: 62 year-old woman with Stage clinical T2N0 triple negative invasive ductal carcinoma of the right breast. (ypT0, ypN0)    HISTORY OF PRESENT ILLNESS::Emily Perez is a 62 y.o. female who was originally seen for consultation in our multidisciplinary breast clinic on 05/11/2017. Since that time, the patient received neoadjuvant chemotherapy with Dr. Jana Hakim consisting of doxorubicin and cyclophosphamide in dose dense fashion x4 starting 05/24/2017, completed 07/05/2017. Accordingly she underwent right breast seed-guided lumpectomy, right deep axillary sentinel node biopsy, and right axillary targeted node dissection on 08/30/2017 with Dr. Donne Hazel. Final pathology showed benign breast parenchyma with treatment-related changes. No evidence of malignancy found in lymph nodes that were sampled.   On review of systems, the patient reports that she is doing well overall. She is unaccompanied today. She reports discomfort in lymph node area from surgical removal. She describes pain as "discomfort" in area of lymph node removal with associated mild swelling. She also reports a loss of energy recently and a lack of interest in daly tasks. A complete review of systems is obtained and is otherwise negative.  The patient is here for further evaluation and discussion of radiation treatment options in the management of her disease.  PREVIOUS RADIATION THERAPY: No  PAST MEDICAL HISTORY:  has a past medical history of Arthritis, Family history of breast cancer, Family history of prostate cancer, Headache, Hypertension, and PONV (postoperative nausea and vomiting).    PAST SURGICAL HISTORY: Past Surgical History:    Procedure Laterality Date  . BREAST LUMPECTOMY WITH RADIOACTIVE SEED AND SENTINEL LYMPH NODE BIOPSY Right 08/30/2017   Procedure: RIGHT BREAST RADIOACTIVE SEED GUIDED LUMPECTOMY WITH RIGHT RADIOACTIVE SEED TARGETED AXILLARY LYMPH NODE EXCISION AND RIGHT SENTINEL LYMPH NODE BIOPSY;  Surgeon: Rolm Bookbinder, Emily Perez;  Location: [place];  Service: General;  Laterality: Right;  . COLONOSCOPY    . FOOT SURGERY Bilateral   . FRACTURE SURGERY Left    wrist  . PORTACATH PLACEMENT Right 05/19/2017   Procedure: INSERTION PORT-A-CATH WITH ULTRASOUND;  Surgeon: Rolm Bookbinder, Emily Perez;  Location: [place];  Service: General;  Laterality: Right;  . TUBAL LIGATION    . WISDOM TOOTH EXTRACTION      FAMILY HISTORY: family history includes Breast cancer (age of onset: 63) in her sister; Breast cancer (age of onset: 58) in her sister; Cancer in her father and paternal aunt; Diabetes in her brother and father; Heart disease in her brother and mother; Hyperlipidemia in her brother and father; Hypertension in her brother and father; Prostate cancer in her paternal uncle; Prostate cancer (age of onset: 28) in her brother.  SOCIAL HISTORY:  reports that she has never smoked. She has never used smokeless tobacco. She reports that she does not drink alcohol or use drugs.  ALLERGIES: Latex  MEDICATIONS:  Current Outpatient Medications  Medication Sig Dispense Refill  . aspirin 81 MG tablet Take 81 mg by mouth 3 (three) times a week.     . famotidine (PEPCID) 20 MG tablet Take 1 tablet (20 mg total) by mouth 2 (two) times daily. (Patient taking differently: Take 20 mg by mouth daily. ) 60 tablet 3  . fluocinonide cream (LIDEX) 1.82 % Apply 1 application topically 2 (two) times daily as needed (  rash).    . meclizine (ANTIVERT) 12.5 MG tablet Take 6.25 mg by mouth 3 (three) times daily as needed for dizziness.    . Multiple Vitamins-Minerals (MULTIVITAMIN WITH MINERALS) tablet Take 1 tablet by mouth daily.    . naproxen  sodium (ANAPROX) 550 MG tablet Take 550 mg by mouth daily as needed (pain). Takes 0.5-1 tablet as needed for pain     . OVER THE COUNTER MEDICATION Apply 1 application topically at bedtime as needed. Pain Relief Balm     . oxyCODONE (OXY IR/ROXICODONE) 5 MG immediate release tablet Take 1 tablet (5 mg total) by mouth every 6 (six) hours as needed for moderate pain, severe pain or breakthrough pain. 10 tablet 0  . oxyCODONE (OXY IR/ROXICODONE) 5 MG immediate release tablet Take 1 tablet (5 mg total) by mouth every 6 (six) hours as needed for moderate pain, severe pain or breakthrough pain. 10 tablet 0  . triamterene-hydrochlorothiazide (DYAZIDE) 37.5-25 MG capsule Take 1 capsule by mouth daily.  3  . vitamin B-12 (CYANOCOBALAMIN) 1000 MCG tablet Take 500 mcg by mouth daily as needed. Takes 0.5 tablet      No current facility-administered medications for this encounter.     REVIEW OF SYSTEMS:  REVIEW OF SYSTEMS: A 10+ POINT REVIEW OF SYSTEMS WAS OBTAINED including neurology, dermatology, psychiatry, cardiac, respiratory, lymph, extremities, GI, GU, musculoskeletal, constitutional, reproductive, HEENT. All pertinent positives are noted in the HPI. All others are negative.   PHYSICAL EXAM:  height is 5' 3.5" (1.613 m) and weight is 175 lb 6.4 oz (79.6 kg). Her oral temperature is 98.5 F (36.9 C). Her blood pressure is 126/84 and her pulse is 87. Her respiration is 20 and oxygen saturation is 99%.   Lungs are clear to auscultation bilaterally. Heart has regular rate and rhythm. No palpable cervical, supraclavicular, or axillary adenopathy. Abdomen soft, non-tender, normal bowel sounds. Periareolar scar in the right breast which is healing well without signs of drainage or infection. She also has a small separate scar in the right axilla without signs of drainage or infection with one steri-strip remaining.  ECOG = 1  LABORATORY DATA:  Lab Results  Component Value Date   WBC 6.5 08/17/2017   HGB  10.0 (L) 08/17/2017   HCT 31.4 (L) 08/17/2017   MCV 81.1 08/17/2017   PLT 226 08/17/2017   NEUTROABS 4.5 08/03/2017   Lab Results  Component Value Date   NA 140 08/17/2017   K 3.7 08/17/2017   CL 104 08/17/2017   CO2 28 08/17/2017   GLUCOSE 93 08/17/2017   CREATININE 1.22 (H) 08/17/2017   CALCIUM 9.3 08/17/2017      RADIOGRAPHY: Nm Sentinel Node Inj-no Rpt (breast)  Result Date: 08/30/2017 Sulfur colloid was injected by the nuclear medicine technologist for melanoma sentinel node.      IMPRESSION: 61 year-old woman with clinical Stage T2N0 triple negative invasive ductal carcinoma of the right breast (ypT0, ypN0)  The patient has had an excellent response to her neoadjuvant chemotherapy with no residual cancer found within the breast specimen or within the nodal areas. The patient is quite pleased to see this result as this portends a better prognosis.  The patient would be a good candidate for breast conservation with radiation therapy directed at the right breast and high axilla / supraclavicular region. We will also check if there are any indications for coverage of the internal mammary nodal region; on the pre-treatment MRI the patient was felt to have 1 cm  internal mammary node along the right side with possible involvement of the contralateral mammary nodal chain on the left side at the same level.  Today, I talked with the patient about the findings and work-up thus far.  We discussed the natural history of right breast cancer and general treatment, highlighting the role of radiotherapy in the management.  We discussed the available radiation techniques, and focused on the details of logistics and delivery.  We reviewed the anticipated acute and late sequelae associated with radiation in this setting.  The patient was encouraged to ask questions that I answered to the best of my ability.  PLAN: Plan for simulation approximately 5 weeks post-surgery and treatment to begin  approximately 6 weeks post-surgery. Anticipate approximately 5 1/2 to 6 weeks radiation therapy.      ------------------------------------------------  Blair Promise, PhD, Emily Perez  This document serves as a record of services personally performed by Gery Pray, Emily Perez. It was created on his behalf by Arlyce Harman, a trained medical scribe. The creation of this record is based on the scribe's personal observations and the provider's statements to them. This document has been checked and approved by the attending provider.

## 2017-09-15 ENCOUNTER — Encounter: Payer: Self-pay | Admitting: General Practice

## 2017-09-15 NOTE — Progress Notes (Signed)
[facility] Psychosocial Distress Screening Clinical Social Work  Clinical Social Work was referred by distress screening protocol.  The patient scored a 8 on the Psychosocial Distress Thermometer which indicates moderate distress. Clinical Social Worker contacted patient by phone to assess for distress and other psychosocial needs.   ONCBCN DISTRESS SCREENING 09/14/2017  Screening Type Initial Screening  Distress experienced in past week (1-10) 8  Practical problem type Housing;Insurance  Emotional problem type Nervousness/Anxiety;Adjusting to illness;Boredom;Adjusting to appearance changes  Spiritual/Religous concerns type   Information Concerns Type Lack of info about maintaining fitness  Physical Problem type Pain;Sleep/insomnia;Breathing;Loss of appetitie;Skin dry/itchy  Physician notified of physical symptoms   Referral to clinical psychology   Referral to clinical social work   Referral to dietition   Referral to support programs     Clinical Social Worker follow up needed: Yes.   unable to reach by phone, will recontact.  Left VM requesting call back.    If yes, follow up plan:  Beverely Pace, [place], LCSW Clinical Social Worker Phone:  [phone]

## 2017-09-16 ENCOUNTER — Encounter: Payer: Self-pay | Admitting: General Practice

## 2017-09-16 ENCOUNTER — Inpatient Hospital Stay: Payer: BC Managed Care – PPO

## 2017-09-16 ENCOUNTER — Inpatient Hospital Stay: Payer: BC Managed Care – PPO | Attending: Oncology | Admitting: Oncology

## 2017-09-16 ENCOUNTER — Telehealth: Payer: Self-pay | Admitting: Oncology

## 2017-09-16 VITALS — BP 124/72 | HR 73 | Temp 98.9°F | Ht 63.5 in | Wt 175.4 lb

## 2017-09-16 DIAGNOSIS — C50511 Malignant neoplasm of lower-outer quadrant of right female breast: Secondary | ICD-10-CM | POA: Diagnosis not present

## 2017-09-16 DIAGNOSIS — Z1501 Genetic susceptibility to malignant neoplasm of breast: Secondary | ICD-10-CM | POA: Insufficient documentation

## 2017-09-16 DIAGNOSIS — Z171 Estrogen receptor negative status [ER-]: Secondary | ICD-10-CM | POA: Diagnosis not present

## 2017-09-16 DIAGNOSIS — C50411 Malignant neoplasm of upper-outer quadrant of right female breast: Secondary | ICD-10-CM | POA: Insufficient documentation

## 2017-09-16 DIAGNOSIS — Z9221 Personal history of antineoplastic chemotherapy: Secondary | ICD-10-CM | POA: Diagnosis not present

## 2017-09-16 LAB — COMPREHENSIVE METABOLIC PANEL
ALT: 19 U/L (ref 0–44)
AST: 24 U/L (ref 15–41)
Albumin: 4.2 g/dL (ref 3.5–5.0)
Alkaline Phosphatase: 81 U/L (ref 38–126)
Anion gap: 9 (ref 5–15)
BUN: 29 mg/dL — ABNORMAL HIGH (ref 8–23)
CO2: 28 mmol/L (ref 22–32)
Calcium: 9.3 mg/dL (ref 8.9–10.3)
Chloride: 103 mmol/L (ref 98–111)
Creatinine, Ser: 1.34 mg/dL — ABNORMAL HIGH (ref 0.44–1.00)
GFR calc Af Amer: 48 mL/min — ABNORMAL LOW (ref >60)
GFR calc non Af Amer: 41 mL/min — ABNORMAL LOW (ref >60)
Glucose, Bld: 115 mg/dL — ABNORMAL HIGH (ref 70–99)
Potassium: 3.6 mmol/L (ref 3.5–5.1)
Sodium: 140 mmol/L (ref 135–145)
Total Bilirubin: 0.4 mg/dL (ref 0.3–1.2)
Total Protein: 7.9 g/dL (ref 6.5–8.1)

## 2017-09-16 LAB — CBC WITH DIFFERENTIAL (CANCER CENTER ONLY)
Basophils Absolute: 0 10*3/uL (ref 0.0–0.1)
Basophils Relative: 1 %
Eosinophils Absolute: 0.2 10*3/uL (ref 0.0–0.5)
Eosinophils Relative: 3 %
HCT: 31.3 % — ABNORMAL LOW (ref 34.8–46.6)
Hemoglobin: 10.1 g/dL — ABNORMAL LOW (ref 11.6–15.9)
Lymphocytes Relative: 25 %
Lymphs Abs: 1.5 10*3/uL (ref 0.9–3.3)
MCH: 26.8 pg (ref 25.1–34.0)
MCHC: 32.3 g/dL (ref 31.5–36.0)
MCV: 82.9 fL (ref 79.5–101.0)
Monocytes Absolute: 0.6 10*3/uL (ref 0.1–0.9)
Monocytes Relative: 10 %
Neutro Abs: 3.7 10*3/uL (ref 1.5–6.5)
Neutrophils Relative %: 61 %
Platelet Count: 206 10*3/uL (ref 145–400)
RBC: 3.78 MIL/uL (ref 3.70–5.45)
RDW: 15.9 % — ABNORMAL HIGH (ref 11.2–14.5)
WBC Count: 6.1 10*3/uL (ref 3.9–10.3)

## 2017-09-16 NOTE — Telephone Encounter (Signed)
Gave patient avs and calendar of upcoming October appointments.

## 2017-09-16 NOTE — Progress Notes (Signed)
[facility] CSW Progress Note  Second call to patient to follow up on Distress Screen.  Unable to reach by phone, left VM.  Will also attempt to contact during scheduled appt today.  Edwyna Shell, LCSW Clinical Social Worker Phone:  [phone]

## 2017-09-20 ENCOUNTER — Telehealth: Payer: Self-pay

## 2017-09-20 ENCOUNTER — Other Ambulatory Visit: Payer: Self-pay | Admitting: Oncology

## 2017-09-20 NOTE — Telephone Encounter (Signed)
Spoke with patient regarding wanting port removed.  Dr. Jana Hakim voiced agreement.  Message sent to Dr. Donne Hazel.  Patient aware and voiced understanding and agreement.

## 2017-09-29 ENCOUNTER — Inpatient Hospital Stay: Payer: BC Managed Care – PPO | Attending: Oncology | Admitting: Medical

## 2017-09-29 ENCOUNTER — Telehealth: Payer: Self-pay

## 2017-09-29 ENCOUNTER — Ambulatory Visit: Payer: BC Managed Care – PPO | Admitting: Oncology

## 2017-09-29 ENCOUNTER — Ambulatory Visit: Payer: BC Managed Care – PPO | Admitting: Rehabilitation

## 2017-09-29 VITALS — BP 127/71 | HR 81 | Temp 98.6°F | Resp 20 | Ht 63.5 in | Wt 177.1 lb

## 2017-09-29 DIAGNOSIS — L509 Urticaria, unspecified: Secondary | ICD-10-CM | POA: Diagnosis not present

## 2017-09-29 DIAGNOSIS — Z8042 Family history of malignant neoplasm of prostate: Secondary | ICD-10-CM | POA: Insufficient documentation

## 2017-09-29 DIAGNOSIS — Z803 Family history of malignant neoplasm of breast: Secondary | ICD-10-CM | POA: Insufficient documentation

## 2017-09-29 DIAGNOSIS — L299 Pruritus, unspecified: Secondary | ICD-10-CM | POA: Insufficient documentation

## 2017-09-29 DIAGNOSIS — C50511 Malignant neoplasm of lower-outer quadrant of right female breast: Secondary | ICD-10-CM

## 2017-09-29 DIAGNOSIS — Z170 Estrogen receptor positive status [ER+]: Secondary | ICD-10-CM | POA: Diagnosis not present

## 2017-09-29 MED ORDER — CETIRIZINE HCL 10 MG PO TABS: 10.0000 mg | ORAL_TABLET | Freq: Every day | ORAL | 1 refills | Status: DC

## 2017-09-29 MED ORDER — FEXOFENADINE HCL 60 MG PO TABS: 60.0000 mg | ORAL_TABLET | Freq: Two times a day (BID) | ORAL | 1 refills | Status: DC

## 2017-09-29 MED ORDER — PREDNISONE 5 MG PO TABS: ORAL_TABLET | ORAL | 0 refills | Status: DC

## 2017-09-29 NOTE — Patient Instructions (Signed)
Pruritus  Pruritus is an itching feeling. There are many different conditions and factors that can make your skin itchy. Dry skin is one of the most common causes of itching. Most cases of itching do not require medical attention. Itchy skin can turn into a rash.  Follow these instructions at home:  Watch your pruritus for any changes. Take these steps to help with your condition:  Skin Care  · Moisturize your skin as needed. A moisturizer that contains petroleum jelly is best for keeping moisture in your skin.  · Take or apply medicines only as directed by your health care provider. This may include:  ? Corticosteroid cream.  ? Anti-itch lotions.  ? Oral anti-histamines.  · Apply cool compresses to the affected areas.  · Try taking a bath with:  ? Epsom salts. Follow the instructions on the packaging. You can get these at your local pharmacy or grocery store.  ? Baking soda. Pour a small amount into the bath as directed by your health care provider.  ? Colloidal oatmeal. Follow the instructions on the packaging. You can get this at your local pharmacy or grocery store.  · Try applying baking soda paste to your skin. Stir water into baking soda until it reaches a paste-like consistency.  · Do not scratch your skin.  · Avoid hot showers or baths, which can make itching worse. A cold shower may help with itching as long as you use a moisturizer after.  · Avoid scented soaps, detergents, and perfumes. Use gentle soaps, detergents, perfumes, and other cosmetic products.  General instructions  · Avoid wearing tight clothes.  · Keep a journal to help track what causes your itch. Write down:  ? What you eat.  ? What cosmetic products you use.  ? What you drink.  ? What you wear. This includes jewelry.  · Use a humidifier. This keeps the air moist, which helps to prevent dry skin.  Contact a health care provider if:  · The itching does not go away after several days.  · You sweat at night.  · You have weight loss.  · You  are unusually thirsty.  · You urinate more than normal.  · You are more tired than normal.  · You have abdominal pain.  · Your skin tingles.  · You feel weak.  · Your skin or the whites of your eyes look yellow (jaundice).  · Your skin feels numb.  This information is not intended to replace advice given to you by your health care provider. Make sure you discuss any questions you have with your health care provider.  Document Released: 11/18/2010 Document Revised: 08/14/2015 Document Reviewed: 03/04/2014  Elsevier Interactive Patient Education © 2018 Elsevier Inc.

## 2017-09-29 NOTE — Telephone Encounter (Signed)
Returned patient's call.  Patient c/o severe itching to back and buttock X 2 weeks.  Patient has tried OTC benadryl and OTC anti itch creams with no improvement.  Patient stated, "I'm unable to sleep because of it, I just need some relief."  Patient was provided an appointment to symptoms management today at 3 pm.  Patient voiced understanding and agreement.

## 2017-09-30 NOTE — Progress Notes (Signed)
Symptoms Management Clinic Progress Note   Emily Perez [id number] 1955/05/06 62 y.o.  MARELIN TAT is managed by Dr. Jana Hakim  Actively treated with chemotherapy/immunotherapy: no  Assessment: Plan:    Urticaria - Plan: fexofenadine (ALLEGRA ALLERGY) 60 MG tablet, cetirizine (ZYRTEC ALLERGY) 10 MG tablet, predniSONE (DELTASONE) 5 MG tablet  Urticaria: The patient was instructed to begin using Allegra 60 mg p.o. twice daily and Zyrtec 10 mg once daily.  She was also given a prescription for a 6-day prednisone taper should these not be effective.  Please see After Visit Summary for patient specific instructions.  Future Appointments  Date Time Provider [place]  10/04/2017  1:00 PM Gery Pray, MD [facility] None  10/12/2017  1:00 PM Tevis, Adrian Prince, PT [facility] None  12/20/2017  1:30 PM [facility] None  12/20/2017  2:00 PM Magrinat, Virgie Dad, MD [facility] None    No orders of the defined types were placed in this encounter.      Subjective:   Patient ID:  Emily Perez is a 62 y.o. (DOB [birth year]) female.  Chief Complaint:  Chief Complaint  Patient presents with  . Pruritis    HPI JNIYAH Perez is a 62 year old female with a history of an ER positive malignant neoplasm of the right breast who is followed by Dr. Jana Hakim.  She is status post recent right lumpectomy on 08/30/2017.  She has had generalized pruritus since that time.  This is localized over her back shoulders and buttock.  She denies any rash.  She reports that her itching began around 1 week after her surgery.  She has tried over-the-counter lotions, creams, and hydrocortisone without relief.  She denies any shortness of breath, throat tightness, chest tightness, or burning in her mouth.  Medications: I have reviewed the patient's current medications.  Allergies:  Allergies  Allergen Reactions  . Latex Hives and Rash    Past Medical History:  Diagnosis  Date  . Arthritis   . Family history of breast cancer   . Family history of prostate cancer   . Headache    migraines in the past  . Hypertension   . PONV (postoperative nausea and vomiting)    after 1st colonoscopy    Past Surgical History:  Procedure Laterality Date  . BREAST LUMPECTOMY WITH RADIOACTIVE SEED AND SENTINEL LYMPH NODE BIOPSY Right 08/30/2017   Procedure: RIGHT BREAST RADIOACTIVE SEED GUIDED LUMPECTOMY WITH RIGHT RADIOACTIVE SEED TARGETED AXILLARY LYMPH NODE EXCISION AND RIGHT SENTINEL LYMPH NODE BIOPSY;  Surgeon: Rolm Bookbinder, MD;  Location: [place];  Service: General;  Laterality: Right;  . COLONOSCOPY    . FOOT SURGERY Bilateral   . FRACTURE SURGERY Left    wrist  . PORTACATH PLACEMENT Right 05/19/2017   Procedure: INSERTION PORT-A-CATH WITH ULTRASOUND;  Surgeon: Rolm Bookbinder, MD;  Location: [place];  Service: General;  Laterality: Right;  . TUBAL LIGATION    . WISDOM TOOTH EXTRACTION      Family History  Problem Relation Age of Onset  . Heart disease Mother   . Cancer Father   . Diabetes Father   . Hypertension Father   . Hyperlipidemia Father   . Heart disease Brother   . Hypertension Brother   . Hyperlipidemia Brother   . Diabetes Brother   . Prostate cancer Brother 11  . Breast cancer Sister 75  . Breast cancer Sister 80  . Cancer Paternal Aunt        unsure what  type of cancer she had  . Prostate cancer Paternal Uncle     Social History   Socioeconomic History  . Marital status: Legally Separated    Spouse name: Not on file  . Number of children: Not on file  . Years of education: Not on file  . Highest education level: Not on file  Occupational History  . Not on file  Social Needs  . Financial resource strain: Not on file  . Food insecurity:    Worry: Not on file    Inability: Not on file  . Transportation needs:    Medical: Not on file    Non-medical: Not on file  Tobacco Use  . Smoking status: Never Smoker  . Smokeless  tobacco: Never Used  Substance and Sexual Activity  . Alcohol use: No  . Drug use: No  . Sexual activity: Not on file  Lifestyle  . Physical activity:    Days per week: Not on file    Minutes per session: Not on file  . Stress: Not on file  Relationships  . Social connections:    Talks on phone: Not on file    Gets together: Not on file    Attends religious service: Not on file    Active member of club or organization: Not on file    Attends meetings of clubs or organizations: Not on file    Relationship status: Not on file  . Intimate partner violence:    Fear of current or ex partner: Not on file    Emotionally abused: Not on file    Physically abused: Not on file    Forced sexual activity: Not on file  Other Topics Concern  . Not on file  Social History Narrative  . Not on file    Past Medical History, Surgical history, Social history, and Family history were reviewed and updated as appropriate.   Please see review of systems for further details on the patient's review from today.   Review of Systems:  Review of Systems  Constitutional: Negative for chills, diaphoresis and fever.  HENT: Negative for facial swelling and trouble swallowing.   Respiratory: Negative for cough, chest tightness and shortness of breath.   Cardiovascular: Negative for chest pain.  Skin: Negative for rash.       Pruritus    Objective:   Physical Exam:  BP 127/71 (BP Location: Left Arm, Patient Position: Sitting)   Pulse 81   Temp 98.6 F (37 C) (Oral)   Resp 20   Ht 5' 3.5" (1.613 m)   Wt 177 lb 1.6 oz (80.3 kg)   SpO2 100%   BMI 30.88 kg/m  ECOG: 0  Physical Exam  Constitutional: No distress.  Neurological: She is alert. Coordination normal.  Skin: Skin is warm and dry. No rash noted. She is not diaphoretic. No erythema.  Psychiatric: She has a normal mood and affect. Her behavior is normal. Judgment and thought content normal.    Lab Review:     Component Value Date/Time    NA 140 09/16/2017 1349   K 3.6 09/16/2017 1349   CL 103 09/16/2017 1349   CO2 28 09/16/2017 1349   GLUCOSE 115 (H) 09/16/2017 1349   BUN 29 (H) 09/16/2017 1349   CREATININE 1.34 (H) 09/16/2017 1349   CREATININE 1.38 (H) 08/03/2017 1114   CALCIUM 9.3 09/16/2017 1349   PROT 7.9 09/16/2017 1349   ALBUMIN 4.2 09/16/2017 1349   AST 24 09/16/2017 1349   AST  30 08/03/2017 1114   ALT 19 09/16/2017 1349   ALT 31 08/03/2017 1114   ALKPHOS 81 09/16/2017 1349   BILITOT 0.4 09/16/2017 1349   BILITOT 0.5 08/03/2017 1114   GFRNONAA 41 (L) 09/16/2017 1349   GFRNONAA 40 (L) 08/03/2017 1114   GFRAA 48 (L) 09/16/2017 1349   GFRAA 46 (L) 08/03/2017 1114       Component Value Date/Time   WBC 6.1 09/16/2017 1349   WBC 6.5 08/17/2017 1329   RBC 3.78 09/16/2017 1349   HGB 10.1 (L) 09/16/2017 1349   HCT 31.3 (L) 09/16/2017 1349   PLT 206 09/16/2017 1349   MCV 82.9 09/16/2017 1349   MCH 26.8 09/16/2017 1349   MCHC 32.3 09/16/2017 1349   RDW 15.9 (H) 09/16/2017 1349   LYMPHSABS 1.5 09/16/2017 1349   MONOABS 0.6 09/16/2017 1349   EOSABS 0.2 09/16/2017 1349   BASOSABS 0.0 09/16/2017 1349   -------------------------------  Imaging from last 24 hours (if applicable):  Radiology interpretation: No results found.

## 2017-10-04 ENCOUNTER — Ambulatory Visit
Admission: RE | Admit: 2017-10-04 | Discharge: 2017-10-04 | Disposition: A | Discharge status: Home / Self Care | Payer: BC Managed Care – PPO | Source: Ambulatory Visit | Attending: Radiation Oncology | Admitting: Radiation Oncology

## 2017-10-04 DIAGNOSIS — Z510 Encounter for antineoplastic radiation therapy: Secondary | ICD-10-CM | POA: Insufficient documentation

## 2017-10-04 DIAGNOSIS — C50511 Malignant neoplasm of lower-outer quadrant of right female breast: Secondary | ICD-10-CM | POA: Diagnosis present

## 2017-10-04 DIAGNOSIS — Z171 Estrogen receptor negative status [ER-]: Secondary | ICD-10-CM | POA: Diagnosis not present

## 2017-10-04 NOTE — Progress Notes (Signed)
  Radiation Oncology         [phone] ________________________________  Name: Emily Perez MRN: [id number]  Date: 10/04/2017  DOB: July 10, 1955  SIMULATION AND TREATMENT PLANNING NOTE    ICD-10-CM   1. Malignant neoplasm of lower-outer quadrant of right breast of female, estrogen receptor negative ([place]) C50.511    Z17.1     DIAGNOSIS:  Clinical stage III-B invasive ductal carcinoma the right breast (cT2, cN1, M0), triple negative (ypT0, ypN0)  NARRATIVE:  The patient was brought to the [facility].  Identity was confirmed.  All relevant records and images related to the planned course of therapy were reviewed.  The patient freely provided informed written consent to proceed with treatment after reviewing the details related to the planned course of therapy. The consent form was witnessed and verified by the simulation staff.  Then, the patient was set-up in a stable reproducible  supine position for radiation therapy.  CT images were obtained.  Surface markings were placed.  The CT images were loaded into the planning software.  Then the target and avoidance structures were contoured.  Treatment planning then occurred.  The radiation prescription was entered and confirmed.  Then, I designed and supervised the construction of a total of 5 medically necessary complex treatment devices.  I have requested : 3D Simulation  I have requested a DVH of the following structures: lumpectomy cavity lungs, heart.  I have ordered:dose calc.  PLAN:  The patient will receive 50.4 Gy in 28 fractions directed at the right breast. The axillary area will receive 45 gray in 25 fractions. The upper ipsilateral internal mammary node chain will be covered with her treatment given the possible involvement prior to chemotherapy.  The lumpectomy cavity will be boosted 10 gray in 5 fractions.  -----------------------------------  Blair Promise, PhD, MD

## 2017-10-04 NOTE — Progress Notes (Signed)
  Radiation Oncology         [phone] ________________________________  Name: Emily Perez MRN: [id number]  Date: 10/04/2017  DOB: 1955-11-09  Optical Surface Tracking Plan:  Since intensity modulated radiotherapy (IMRT) and 3D conformal radiation treatment methods are predicated on accurate and precise positioning for treatment, intrafraction motion monitoring is medically necessary to ensure accurate and safe treatment delivery.  The ability to quantify intrafraction motion without excessive ionizing radiation dose can only be performed with optical surface tracking. Accordingly, surface imaging offers the opportunity to obtain 3D measurements of patient position throughout IMRT and 3D treatments without excessive radiation exposure.  I am ordering optical surface tracking for this patient's upcoming course of radiotherapy. ________________________________  Gery Pray, MD 10/04/2017 8:20 PM    Reference:   Particia Jasper, et al. Surface imaging-based analysis of intrafraction motion for breast radiotherapy patients.Journal of [place], n. 6, nov. 2014. ISSN [id number].   Available at: <http://www.jacmp.org/index.php/jacmp/article/view/4957>.

## 2017-10-10 DIAGNOSIS — C50511 Malignant neoplasm of lower-outer quadrant of right female breast: Secondary | ICD-10-CM | POA: Diagnosis not present

## 2017-10-11 ENCOUNTER — Ambulatory Visit
Admission: RE | Admit: 2017-10-11 | Discharge: 2017-10-11 | Disposition: A | Discharge status: Home / Self Care | Payer: BC Managed Care – PPO | Source: Ambulatory Visit | Attending: Radiation Oncology | Admitting: Radiation Oncology

## 2017-10-11 ENCOUNTER — Telehealth: Payer: Self-pay

## 2017-10-11 DIAGNOSIS — C50511 Malignant neoplasm of lower-outer quadrant of right female breast: Secondary | ICD-10-CM | POA: Diagnosis not present

## 2017-10-11 NOTE — Telephone Encounter (Signed)
Called pt and lvm with call back number to get more details about her itching in her back/ buttucks area.

## 2017-10-12 ENCOUNTER — Telehealth: Payer: Self-pay | Admitting: Emergency Medicine

## 2017-10-12 ENCOUNTER — Ambulatory Visit: Payer: BC Managed Care – PPO | Attending: General Surgery | Admitting: Rehabilitation

## 2017-10-12 ENCOUNTER — Ambulatory Visit
Admission: RE | Admit: 2017-10-12 | Discharge: 2017-10-12 | Disposition: A | Discharge status: Home / Self Care | Payer: BC Managed Care – PPO | Source: Ambulatory Visit | Attending: Radiation Oncology | Admitting: Radiation Oncology

## 2017-10-12 ENCOUNTER — Other Ambulatory Visit: Payer: Self-pay | Admitting: Medical

## 2017-10-12 ENCOUNTER — Telehealth: Payer: Self-pay

## 2017-10-12 DIAGNOSIS — C50511 Malignant neoplasm of lower-outer quadrant of right female breast: Secondary | ICD-10-CM | POA: Diagnosis not present

## 2017-10-12 MED ORDER — TRIAMCINOLONE ACETONIDE 0.025 % EX LOTN: 1.0000 "application " | TOPICAL_LOTION | Freq: Three times a day (TID) | CUTANEOUS | 1 refills | Status: DC

## 2017-10-12 NOTE — Telephone Encounter (Signed)
Called pt to address her complaint about her back itching. Pt reports that its coming from inside her back and it all started after her recent surgery and anesthesia. She is having a lot of discomfort from her mid back area and don't know what to do. Suggested that pt contact the surgeon's office to ask if it is possible that anesthesia can cause itching and discomfort from the inside where anesthesia was given. Discussed with Dr.Magrinat and feels if it is skin related, she will need to see a dermatogist. Pt denies any sores, drainage or open area on the affected site. Pt has no further needs at this time. She will call our office if she has any more questions.

## 2017-10-12 NOTE — Telephone Encounter (Signed)
Returning's pt's VM from yesterday afternoon.  Pt stated that she can't take prednisone because of the side effects which includes lack of sleep.  PA Lucianne Lei aware.  Pt to start TA lotion sent in to [place] for itching.  Pt verbalized understanding of usage directions and to call with any issues.

## 2017-10-13 ENCOUNTER — Ambulatory Visit
Admission: RE | Admit: 2017-10-13 | Discharge: 2017-10-13 | Disposition: A | Discharge status: Home / Self Care | Payer: BC Managed Care – PPO | Source: Ambulatory Visit | Attending: Radiation Oncology | Admitting: Radiation Oncology

## 2017-10-13 DIAGNOSIS — C50511 Malignant neoplasm of lower-outer quadrant of right female breast: Secondary | ICD-10-CM | POA: Diagnosis not present

## 2017-10-14 ENCOUNTER — Ambulatory Visit
Admission: RE | Admit: 2017-10-14 | Discharge: 2017-10-14 | Disposition: A | Discharge status: Home / Self Care | Payer: BC Managed Care – PPO | Source: Ambulatory Visit | Attending: Radiation Oncology | Admitting: Radiation Oncology

## 2017-10-14 DIAGNOSIS — C50511 Malignant neoplasm of lower-outer quadrant of right female breast: Secondary | ICD-10-CM | POA: Diagnosis not present

## 2017-10-17 ENCOUNTER — Ambulatory Visit
Admission: RE | Admit: 2017-10-17 | Discharge: 2017-10-17 | Disposition: A | Discharge status: Home / Self Care | Payer: BC Managed Care – PPO | Source: Ambulatory Visit | Attending: Radiation Oncology | Admitting: Radiation Oncology

## 2017-10-17 DIAGNOSIS — C50511 Malignant neoplasm of lower-outer quadrant of right female breast: Secondary | ICD-10-CM | POA: Diagnosis not present

## 2017-10-18 ENCOUNTER — Ambulatory Visit
Admission: RE | Admit: 2017-10-18 | Discharge: 2017-10-18 | Disposition: A | Discharge status: Home / Self Care | Payer: BC Managed Care – PPO | Source: Ambulatory Visit | Attending: Radiation Oncology | Admitting: Radiation Oncology

## 2017-10-18 DIAGNOSIS — Z171 Estrogen receptor negative status [ER-]: Secondary | ICD-10-CM

## 2017-10-18 DIAGNOSIS — C50511 Malignant neoplasm of lower-outer quadrant of right female breast: Secondary | ICD-10-CM | POA: Diagnosis not present

## 2017-10-18 MED ORDER — ALRA NON-METALLIC DEODORANT (RAD-ONC)
1.0000 "application " | Freq: Once | TOPICAL | Status: AC
  Administered 2017-10-18: 1 via TOPICAL

## 2017-10-18 MED ORDER — RADIAPLEXRX EX GEL
Freq: Two times a day (BID) | CUTANEOUS | Status: DC
  Administered 2017-10-18: 17:00:00 via TOPICAL

## 2017-10-19 ENCOUNTER — Ambulatory Visit
Admission: RE | Admit: 2017-10-19 | Discharge: 2017-10-19 | Disposition: A | Discharge status: Home / Self Care | Payer: BC Managed Care – PPO | Source: Ambulatory Visit | Attending: Radiation Oncology | Admitting: Radiation Oncology

## 2017-10-19 DIAGNOSIS — C50511 Malignant neoplasm of lower-outer quadrant of right female breast: Secondary | ICD-10-CM | POA: Diagnosis not present

## 2017-10-20 ENCOUNTER — Ambulatory Visit
Admission: RE | Admit: 2017-10-20 | Discharge: 2017-10-20 | Disposition: A | Discharge status: Home / Self Care | Payer: BC Managed Care – PPO | Source: Ambulatory Visit | Attending: Radiation Oncology | Admitting: Radiation Oncology

## 2017-10-20 DIAGNOSIS — C50511 Malignant neoplasm of lower-outer quadrant of right female breast: Secondary | ICD-10-CM | POA: Insufficient documentation

## 2017-10-20 DIAGNOSIS — Z510 Encounter for antineoplastic radiation therapy: Secondary | ICD-10-CM | POA: Insufficient documentation

## 2017-10-21 ENCOUNTER — Ambulatory Visit
Admission: RE | Admit: 2017-10-21 | Discharge: 2017-10-21 | Disposition: A | Discharge status: Home / Self Care | Payer: BC Managed Care – PPO | Source: Ambulatory Visit | Attending: Radiation Oncology | Admitting: Radiation Oncology

## 2017-10-21 DIAGNOSIS — C50511 Malignant neoplasm of lower-outer quadrant of right female breast: Secondary | ICD-10-CM | POA: Diagnosis not present

## 2017-10-24 ENCOUNTER — Ambulatory Visit
Admission: RE | Admit: 2017-10-24 | Discharge: 2017-10-24 | Disposition: A | Discharge status: Home / Self Care | Payer: BC Managed Care – PPO | Source: Ambulatory Visit | Attending: Radiation Oncology | Admitting: Radiation Oncology

## 2017-10-24 DIAGNOSIS — C50511 Malignant neoplasm of lower-outer quadrant of right female breast: Secondary | ICD-10-CM | POA: Diagnosis not present

## 2017-10-25 ENCOUNTER — Ambulatory Visit
Admission: RE | Admit: 2017-10-25 | Discharge: 2017-10-25 | Disposition: A | Discharge status: Home / Self Care | Payer: BC Managed Care – PPO | Source: Ambulatory Visit | Attending: Radiation Oncology | Admitting: Radiation Oncology

## 2017-10-25 DIAGNOSIS — C50511 Malignant neoplasm of lower-outer quadrant of right female breast: Secondary | ICD-10-CM | POA: Diagnosis not present

## 2017-10-25 DIAGNOSIS — Z171 Estrogen receptor negative status [ER-]: Secondary | ICD-10-CM

## 2017-10-25 MED ORDER — RADIAPLEXRX EX GEL
Freq: Once | CUTANEOUS | Status: AC
  Administered 2017-10-25: 12:00:00 via TOPICAL

## 2017-10-26 ENCOUNTER — Ambulatory Visit
Admission: RE | Admit: 2017-10-26 | Discharge: 2017-10-26 | Disposition: A | Discharge status: Home / Self Care | Payer: BC Managed Care – PPO | Source: Ambulatory Visit | Attending: Radiation Oncology | Admitting: Radiation Oncology

## 2017-10-26 DIAGNOSIS — C50511 Malignant neoplasm of lower-outer quadrant of right female breast: Secondary | ICD-10-CM | POA: Diagnosis not present

## 2017-10-27 ENCOUNTER — Ambulatory Visit
Admission: RE | Admit: 2017-10-27 | Discharge: 2017-10-27 | Disposition: A | Discharge status: Home / Self Care | Payer: BC Managed Care – PPO | Source: Ambulatory Visit | Attending: Radiation Oncology | Admitting: Radiation Oncology

## 2017-10-27 DIAGNOSIS — C50511 Malignant neoplasm of lower-outer quadrant of right female breast: Secondary | ICD-10-CM | POA: Diagnosis not present

## 2017-10-28 ENCOUNTER — Ambulatory Visit
Admission: RE | Admit: 2017-10-28 | Discharge: 2017-10-28 | Disposition: A | Discharge status: Home / Self Care | Payer: BC Managed Care – PPO | Source: Ambulatory Visit | Attending: Radiation Oncology | Admitting: Radiation Oncology

## 2017-10-28 DIAGNOSIS — C50511 Malignant neoplasm of lower-outer quadrant of right female breast: Secondary | ICD-10-CM | POA: Diagnosis not present

## 2017-10-28 NOTE — Telephone Encounter (Signed)
error 

## 2017-10-31 ENCOUNTER — Ambulatory Visit
Admission: RE | Admit: 2017-10-31 | Discharge: 2017-10-31 | Disposition: A | Discharge status: Home / Self Care | Payer: BC Managed Care – PPO | Source: Ambulatory Visit | Attending: Radiation Oncology | Admitting: Radiation Oncology

## 2017-10-31 DIAGNOSIS — C50511 Malignant neoplasm of lower-outer quadrant of right female breast: Secondary | ICD-10-CM | POA: Diagnosis not present

## 2017-11-01 ENCOUNTER — Ambulatory Visit
Admission: RE | Admit: 2017-11-01 | Discharge: 2017-11-01 | Disposition: A | Discharge status: Home / Self Care | Payer: BC Managed Care – PPO | Source: Ambulatory Visit | Attending: Radiation Oncology | Admitting: Radiation Oncology

## 2017-11-01 DIAGNOSIS — C50511 Malignant neoplasm of lower-outer quadrant of right female breast: Secondary | ICD-10-CM | POA: Diagnosis not present

## 2017-11-02 ENCOUNTER — Ambulatory Visit
Admission: RE | Admit: 2017-11-02 | Discharge: 2017-11-02 | Disposition: A | Discharge status: Home / Self Care | Payer: BC Managed Care – PPO | Source: Ambulatory Visit | Attending: Radiation Oncology | Admitting: Radiation Oncology

## 2017-11-02 DIAGNOSIS — C50511 Malignant neoplasm of lower-outer quadrant of right female breast: Secondary | ICD-10-CM | POA: Diagnosis not present

## 2017-11-03 ENCOUNTER — Ambulatory Visit
Admission: RE | Admit: 2017-11-03 | Discharge: 2017-11-03 | Disposition: A | Discharge status: Home / Self Care | Payer: BC Managed Care – PPO | Source: Ambulatory Visit | Attending: Radiation Oncology | Admitting: Radiation Oncology

## 2017-11-03 DIAGNOSIS — C50511 Malignant neoplasm of lower-outer quadrant of right female breast: Secondary | ICD-10-CM | POA: Diagnosis not present

## 2017-11-04 ENCOUNTER — Ambulatory Visit
Admission: RE | Admit: 2017-11-04 | Discharge: 2017-11-04 | Disposition: A | Discharge status: Home / Self Care | Payer: BC Managed Care – PPO | Source: Ambulatory Visit | Attending: Radiation Oncology | Admitting: Radiation Oncology

## 2017-11-04 DIAGNOSIS — C50511 Malignant neoplasm of lower-outer quadrant of right female breast: Secondary | ICD-10-CM | POA: Diagnosis not present

## 2017-11-06 ENCOUNTER — Ambulatory Visit: Admission: RE | Admit: 2017-11-06 | Payer: BC Managed Care – PPO | Source: Ambulatory Visit

## 2017-11-07 ENCOUNTER — Ambulatory Visit
Admission: RE | Admit: 2017-11-07 | Discharge: 2017-11-07 | Disposition: A | Discharge status: Home / Self Care | Payer: BC Managed Care – PPO | Source: Ambulatory Visit | Attending: Radiation Oncology | Admitting: Radiation Oncology

## 2017-11-07 DIAGNOSIS — C50511 Malignant neoplasm of lower-outer quadrant of right female breast: Secondary | ICD-10-CM | POA: Diagnosis not present

## 2017-11-08 ENCOUNTER — Ambulatory Visit
Admission: RE | Admit: 2017-11-08 | Discharge: 2017-11-08 | Disposition: A | Discharge status: Home / Self Care | Payer: BC Managed Care – PPO | Source: Ambulatory Visit | Attending: Radiation Oncology | Admitting: Radiation Oncology

## 2017-11-08 DIAGNOSIS — Z171 Estrogen receptor negative status [ER-]: Secondary | ICD-10-CM

## 2017-11-08 DIAGNOSIS — C50511 Malignant neoplasm of lower-outer quadrant of right female breast: Secondary | ICD-10-CM | POA: Diagnosis not present

## 2017-11-08 MED ORDER — RADIAPLEXRX EX GEL
Freq: Once | CUTANEOUS | Status: AC
  Administered 2017-11-08: 12:00:00 via TOPICAL

## 2017-11-08 MED ORDER — SONAFINE EX EMUL
1.0000 "application " | Freq: Two times a day (BID) | CUTANEOUS | Status: DC
  Administered 2017-11-08: 1 via TOPICAL

## 2017-11-09 ENCOUNTER — Ambulatory Visit
Admission: RE | Admit: 2017-11-09 | Discharge: 2017-11-09 | Disposition: A | Discharge status: Home / Self Care | Payer: BC Managed Care – PPO | Source: Ambulatory Visit | Attending: Radiation Oncology | Admitting: Radiation Oncology

## 2017-11-09 DIAGNOSIS — C50511 Malignant neoplasm of lower-outer quadrant of right female breast: Secondary | ICD-10-CM | POA: Diagnosis not present

## 2017-11-10 ENCOUNTER — Ambulatory Visit
Admission: RE | Admit: 2017-11-10 | Discharge: 2017-11-10 | Disposition: A | Discharge status: Home / Self Care | Payer: BC Managed Care – PPO | Source: Ambulatory Visit | Attending: Radiation Oncology | Admitting: Radiation Oncology

## 2017-11-10 DIAGNOSIS — C50511 Malignant neoplasm of lower-outer quadrant of right female breast: Secondary | ICD-10-CM | POA: Diagnosis not present

## 2017-11-11 ENCOUNTER — Ambulatory Visit
Admission: RE | Admit: 2017-11-11 | Discharge: 2017-11-11 | Disposition: A | Discharge status: Home / Self Care | Payer: BC Managed Care – PPO | Source: Ambulatory Visit | Attending: Radiation Oncology | Admitting: Radiation Oncology

## 2017-11-11 DIAGNOSIS — C50511 Malignant neoplasm of lower-outer quadrant of right female breast: Secondary | ICD-10-CM | POA: Diagnosis not present

## 2017-11-14 ENCOUNTER — Ambulatory Visit
Admission: RE | Admit: 2017-11-14 | Discharge: 2017-11-14 | Disposition: A | Discharge status: Home / Self Care | Payer: BC Managed Care – PPO | Source: Ambulatory Visit | Attending: Radiation Oncology | Admitting: Radiation Oncology

## 2017-11-14 DIAGNOSIS — C50511 Malignant neoplasm of lower-outer quadrant of right female breast: Secondary | ICD-10-CM | POA: Diagnosis not present

## 2017-11-15 ENCOUNTER — Ambulatory Visit: Payer: BC Managed Care – PPO | Admitting: Radiation Oncology

## 2017-11-15 ENCOUNTER — Ambulatory Visit
Admission: RE | Admit: 2017-11-15 | Discharge: 2017-11-15 | Disposition: A | Discharge status: Home / Self Care | Payer: BC Managed Care – PPO | Source: Ambulatory Visit | Attending: Radiation Oncology | Admitting: Radiation Oncology

## 2017-11-15 DIAGNOSIS — C50511 Malignant neoplasm of lower-outer quadrant of right female breast: Secondary | ICD-10-CM | POA: Diagnosis not present

## 2017-11-15 DIAGNOSIS — Z171 Estrogen receptor negative status [ER-]: Secondary | ICD-10-CM

## 2017-11-15 MED ORDER — SONAFINE EX EMUL
1.0000 "application " | Freq: Two times a day (BID) | CUTANEOUS | Status: DC
  Administered 2017-11-15: 1 via TOPICAL

## 2017-11-16 ENCOUNTER — Ambulatory Visit
Admission: RE | Admit: 2017-11-16 | Discharge: 2017-11-16 | Disposition: A | Discharge status: Home / Self Care | Payer: BC Managed Care – PPO | Source: Ambulatory Visit | Attending: Radiation Oncology | Admitting: Radiation Oncology

## 2017-11-16 DIAGNOSIS — C50511 Malignant neoplasm of lower-outer quadrant of right female breast: Secondary | ICD-10-CM | POA: Diagnosis not present

## 2017-11-17 ENCOUNTER — Ambulatory Visit
Admission: RE | Admit: 2017-11-17 | Discharge: 2017-11-17 | Disposition: A | Discharge status: Home / Self Care | Payer: BC Managed Care – PPO | Source: Ambulatory Visit | Attending: Radiation Oncology | Admitting: Radiation Oncology

## 2017-11-17 DIAGNOSIS — Z171 Estrogen receptor negative status [ER-]: Secondary | ICD-10-CM

## 2017-11-17 DIAGNOSIS — C50511 Malignant neoplasm of lower-outer quadrant of right female breast: Secondary | ICD-10-CM

## 2017-11-17 NOTE — Progress Notes (Signed)
.  Simulation verification  The patient was brought to the treatment machine and placed in the plan treatment position.  Clinical set up was verified to ensure that the target region is appropriately covered for the patient's upcoming electron boost treatment.  The targeted volume of tissue is appropriately covered by the radiation field.  Based on my personal review, I approve the simulation verification.  The patient's treatment will proceed as planned.  ------------------------------------------------  -----------------------------------  Blake Goya D. Kimbree Casanas, PhD, MD  

## 2017-11-18 ENCOUNTER — Ambulatory Visit
Admission: RE | Admit: 2017-11-18 | Discharge: 2017-11-18 | Disposition: A | Discharge status: Home / Self Care | Payer: BC Managed Care – PPO | Source: Ambulatory Visit | Attending: Radiation Oncology | Admitting: Radiation Oncology

## 2017-11-18 DIAGNOSIS — C50511 Malignant neoplasm of lower-outer quadrant of right female breast: Secondary | ICD-10-CM | POA: Diagnosis not present

## 2017-11-22 ENCOUNTER — Ambulatory Visit
Admission: RE | Admit: 2017-11-22 | Discharge: 2017-11-22 | Disposition: A | Discharge status: Home / Self Care | Payer: BC Managed Care – PPO | Source: Ambulatory Visit | Attending: Radiation Oncology | Admitting: Radiation Oncology

## 2017-11-22 DIAGNOSIS — C50511 Malignant neoplasm of lower-outer quadrant of right female breast: Secondary | ICD-10-CM | POA: Insufficient documentation

## 2017-11-22 DIAGNOSIS — Z510 Encounter for antineoplastic radiation therapy: Secondary | ICD-10-CM | POA: Diagnosis not present

## 2017-11-22 DIAGNOSIS — Z171 Estrogen receptor negative status [ER-]: Secondary | ICD-10-CM

## 2017-11-22 MED ORDER — SONAFINE EX EMUL
1.0000 "application " | Freq: Two times a day (BID) | CUTANEOUS | Status: DC
  Administered 2017-11-22: 1 via TOPICAL

## 2017-11-22 MED ORDER — RADIAPLEXRX EX GEL
Freq: Once | CUTANEOUS | Status: AC
  Administered 2017-11-22: 14:00:00 via TOPICAL

## 2017-11-23 ENCOUNTER — Ambulatory Visit
Admission: RE | Admit: 2017-11-23 | Discharge: 2017-11-23 | Disposition: A | Discharge status: Home / Self Care | Payer: BC Managed Care – PPO | Source: Ambulatory Visit | Attending: Radiation Oncology | Admitting: Radiation Oncology

## 2017-11-23 DIAGNOSIS — C50511 Malignant neoplasm of lower-outer quadrant of right female breast: Secondary | ICD-10-CM | POA: Diagnosis not present

## 2017-11-24 ENCOUNTER — Ambulatory Visit
Admission: RE | Admit: 2017-11-24 | Discharge: 2017-11-24 | Disposition: A | Discharge status: Home / Self Care | Payer: BC Managed Care – PPO | Source: Ambulatory Visit | Attending: Radiation Oncology | Admitting: Radiation Oncology

## 2017-11-24 DIAGNOSIS — C50511 Malignant neoplasm of lower-outer quadrant of right female breast: Secondary | ICD-10-CM | POA: Diagnosis not present

## 2017-11-25 ENCOUNTER — Ambulatory Visit
Admission: RE | Admit: 2017-11-25 | Discharge: 2017-11-25 | Disposition: A | Discharge status: Home / Self Care | Payer: BC Managed Care – PPO | Source: Ambulatory Visit | Attending: Radiation Oncology | Admitting: Radiation Oncology

## 2017-11-25 DIAGNOSIS — C50511 Malignant neoplasm of lower-outer quadrant of right female breast: Secondary | ICD-10-CM | POA: Diagnosis not present

## 2017-11-28 ENCOUNTER — Ambulatory Visit
Admission: RE | Admit: 2017-11-28 | Discharge: 2017-11-28 | Disposition: A | Discharge status: Home / Self Care | Payer: BC Managed Care – PPO | Source: Ambulatory Visit | Attending: Radiation Oncology | Admitting: Radiation Oncology

## 2017-11-28 ENCOUNTER — Encounter: Payer: Self-pay | Admitting: Radiation Oncology

## 2017-11-28 DIAGNOSIS — C50511 Malignant neoplasm of lower-outer quadrant of right female breast: Secondary | ICD-10-CM

## 2017-11-28 DIAGNOSIS — Z171 Estrogen receptor negative status [ER-]: Secondary | ICD-10-CM

## 2017-11-28 MED ORDER — ALRA NON-METALLIC DEODORANT (RAD-ONC)
1.0000 "application " | Freq: Once | TOPICAL | Status: AC
  Administered 2017-11-28: 1 via TOPICAL

## 2017-11-28 MED ORDER — ALRA NON-METALLIC DEODORANT (RAD-ONC): 1.0000 "application " | Freq: Once | TOPICAL | Status: DC

## 2017-11-28 MED ORDER — SONAFINE EX EMUL: 1.0000 "application " | Freq: Two times a day (BID) | CUTANEOUS | Status: DC

## 2017-11-28 MED ORDER — SONAFINE EX EMUL
1.0000 "application " | Freq: Two times a day (BID) | CUTANEOUS | Status: DC
  Administered 2017-11-28: 1 via TOPICAL

## 2017-11-28 NOTE — Progress Notes (Signed)
  Radiation Oncology         [phone] ________________________________  Name: Emily Perez MRN: [id number]  Date: 11/28/2017  DOB: 11-Dec-1955  End of Treatment Note  DIAGNOSIS:  Clinical stage III-B invasive ductal carcinoma the right breast (cT2, cN1, M0), triple negative (ypT0, ypN0)     Indication for treatment:  Curative       Radiation treatment dates:   10/12/2017-11/28/2017  Site/dose:   1. Right breast, Ax_SCV, 1.8 Gy in 25 fractions for a total dose of 45 Gy.            2. Right breast, 1.8 Gy in 28 fractions for a total dose of 50.4 Gy             3. Boost, 2 Gy in 5 fractions for a total dose of 10 Gy  Beams/energy:   1. 3D, 10X//6X         2. 3D, 6X//10X                    3. Electron, 18E  Narrative: The patient tolerated radiation treatment relatively well. At the beginning of her treatment, she noted mild fatigue and mild hyperpigmentation. She denied pain at the time. She was given Radiaplex and instructed on its use. On PE, it was noted that the right breast area had slight hyperpigmentation changes. Towards the end of her treatment, she noted mild fatigue and skin hyperpigmentation/itching. She denied any other symptoms. On PE, it was noted Skin (right breast, right upper back, and right SCV region) shows hyperpigmentation changes. Mild dry desquamation. No moist desquamation.    Plan: The patient has completed radiation treatment. The patient will return to radiation oncology clinic for routine followup in one month. I advised them to call or return sooner if they have any questions or concerns related to their recovery or treatment.  -----------------------------------  Blair Promise, PhD, MD  This document serves as a record of services personally performed by Gery Pray, MD. It was created on his behalf by [facility], a trained medical scribe. The creation of this record is based on the scribe's personal observations and the provider's statements to  them. This document has been checked and approved by the attending provider.

## 2017-12-19 NOTE — Progress Notes (Signed)
Emily Perez  [phone] [phone]     ID: Emily Perez DOB: 03-29-1955  MR#: [id number]  RCV#:[id number]  Patient Care Team: Shirline Frees, MD as PCP - General (Family Medicine) Magrinat, Virgie Dad, MD as Consulting Physician (Oncology) Rolm Bookbinder, MD as Consulting Physician (General Surgery) Gery Pray, MD as Consulting Physician (Radiation Oncology) Allyn Kenner, MD (Dermatology) Thurnell Lose, MD as Consulting Physician (Obstetrics and Gynecology) Marchia Bond, MD as Consulting Physician (Orthopedic Surgery) Christene Slates, MD as Physician Assistant (Radiology) OTHER MD:  CHIEF COMPLAINT: Triple negative breast cancer; PALB2 mutation  CURRENT TREATMENT: Observation  INTERVAL HISTORY: Emily Perez returns today for follow up and treatment of her triple negative breast cancer. She completed adjuvant radiant treatments on 11/28/2017. She tolerated this well.   REVIEW OF SYSTEMS: Emily Perez reports that she has been resting. For exercise, she was walking to her radiation appointments, but now she voluntarily picks a far away parking spot to walk to the store. She likes her hair. She denies unusual headaches, visual changes, nausea, vomiting, or dizziness. There has been no unusual cough, phlegm production, or pleurisy. There has been no change in bowel or bladder habits. She denies unexplained fatigue or unexplained weight loss, bleeding, rash, or fever. A detailed review of systems was otherwise stable.    HISTORY OF CURRENT ILLNESS: Emily Perez had routine bilateral screening mammography at [facility] on 04/19/2017 showing a possible abnormality in the right breast. She underwent unilateral right diagnostic mammography with tomography and right breast ultrasonography at [facility] on 04/25/2017 showing: breast density category B.  In the right breast at the 6:00 radiant 2 cm from the nipple there was a 2 cm oval mass which by ultrasound measured  2.5 cm and was irregular and hypoechoic.  There were also at least 3 abnormal appearing lymph nodes in the right axilla.  Accordingly on 05/02/2017 she proceeded to biopsy of the right breast mass in question and lymph node sampling. The pathology from this procedure showed ([id number]): Invasive ductal carcinoma grade II. One lymph node positive for metastatic carcinoma (1/1). Prognostic indicators significant for: estrogen receptor, 0% negative and progesterone receptor, 0% negative. Proliferation marker Ki67 at 80%. HER2 not amplified with ratios HER2/CEP17 signals 1.29 and average copies per cell 2.25  The patient's subsequent history is as detailed below.    PAST MEDICAL HISTORY: Past Medical History:  Diagnosis Date  . Arthritis   . Family history of breast cancer   . Family history of prostate cancer   . Headache    migraines in the past  . Hypertension   . PONV (postoperative nausea and vomiting)    after 1st colonoscopy  Migraines in the past.   PAST SURGICAL HISTORY: Past Surgical History:  Procedure Laterality Date  . BREAST LUMPECTOMY WITH RADIOACTIVE SEED AND SENTINEL LYMPH NODE BIOPSY Right 08/30/2017   Procedure: RIGHT BREAST RADIOACTIVE SEED GUIDED LUMPECTOMY WITH RIGHT RADIOACTIVE SEED TARGETED AXILLARY LYMPH NODE EXCISION AND RIGHT SENTINEL LYMPH NODE BIOPSY;  Surgeon: Rolm Bookbinder, MD;  Location: [place];  Service: General;  Laterality: Right;  . COLONOSCOPY    . FOOT SURGERY Bilateral   . FRACTURE SURGERY Left    wrist  . PORTACATH PLACEMENT Right 05/19/2017   Procedure: INSERTION PORT-A-CATH WITH ULTRASOUND;  Surgeon: Rolm Bookbinder, MD;  Location: [place];  Service: General;  Laterality: Right;  . TUBAL LIGATION    . WISDOM TOOTH EXTRACTION      Wrist Fracture. Torn Rotator cuff and torn meniscus.  FAMILY HISTORY Family History  Problem Relation Age of Onset  . Heart disease Mother   . Cancer Father   . Diabetes Father   . Hypertension Father     . Hyperlipidemia Father   . Heart disease Brother   . Hypertension Brother   . Hyperlipidemia Brother   . Diabetes Brother   . Prostate cancer Brother 34  . Breast cancer Sister 1  . Breast cancer Sister 25  . Cancer Paternal Aunt        unsure what type of cancer she had  . Prostate cancer Paternal Uncle   The patient's father died at age 69 due to heart disease and emphysema. The patient's mother died at age 47 due to heart disease. The patient has 6 brothers and 8 sisters. She notes 2 sisters with breast cancer. The 1st sister was diagnosed at age 55, and the 2nd sister was diagnosed at age 85. She notes that both sisters are alive. She also notes a paternal first cousin with breast cancer diagnosed in her 69's. The patient notes that her father also had bone cancer, but she's unsure if he had myeloma. She denies a family history of ovarian cancer.   GYNECOLOGIC HISTORY:  No LMP recorded. Patient is postmenopausal. 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 She is divorced. She lives by herself with no pets. The patient's daughter, [name], works in Therapist, art for [employer]. The patient's son, [name],  works for [employer]. The patient has  2 grandchildren. She belongs to [employer].      ADVANCED DIRECTIVES: Not in place.  At the 05/19/2017 visit the patient was given the appropriate documents to complete and notarized at her discretion   HEALTH MAINTENANCE: Social History   Tobacco Use  . Smoking status: Never Smoker  . Smokeless tobacco: Never Used  Substance Use Topics  . Alcohol use: No  . Drug use: No     Colonoscopy:   PAP: September 2018 normal  Bone density: none   Allergies  Allergen Reactions  . Latex Hives and Rash    Current Outpatient Medications   Medication Sig Dispense Refill  . aspirin 81 MG tablet Take 81 mg by mouth 3 (three) times a week.     . cetirizine (ZYRTEC ALLERGY) 10 MG tablet Take 1 tablet (10 mg total) by mouth daily. 30 tablet 1  . famotidine (PEPCID) 20 MG tablet Take 1 tablet (20 mg total) by mouth 2 (two) times daily. (Patient taking differently: Take 20 mg by mouth daily. ) 60 tablet 3  . fexofenadine (ALLEGRA ALLERGY) 60 MG tablet Take 1 tablet (60 mg total) by mouth 2 (two) times daily. 60 tablet 1  . fluocinonide cream (LIDEX) 3.66 % Apply 1 application topically 2 (two) times daily as needed (rash).    . meclizine (ANTIVERT) 12.5 MG tablet Take 6.25 mg by mouth 3 (three) times daily as needed for dizziness.    . Multiple Vitamins-Minerals (MULTIVITAMIN WITH MINERALS) tablet Take 1 tablet by mouth daily.    . naproxen sodium (ANAPROX) 550 MG tablet Take 550 mg by mouth daily as needed (pain). Takes 0.5-1 tablet as needed for pain     . OVER THE COUNTER MEDICATION Apply 1 application topically at bedtime as needed. Pain Relief Balm     .  oxyCODONE (OXY IR/ROXICODONE) 5 MG immediate release tablet Take 1 tablet (5 mg total) by mouth every 6 (six) hours as needed for moderate pain, severe pain or breakthrough pain. (Patient not taking: Reported on 09/14/2017) 10 tablet 0  . oxyCODONE (OXY IR/ROXICODONE) 5 MG immediate release tablet Take 1 tablet (5 mg total) by mouth every 6 (six) hours as needed for moderate pain, severe pain or breakthrough pain. (Patient not taking: Reported on 09/14/2017) 10 tablet 0  . predniSONE (DELTASONE) 5 MG tablet 6 tab x 1 day, 5 tab x 1 day, 4 tab x 1 day, 3 tab x 1 day, 2 tab x 1 day, 1 tab x 1 day, stop 21 tablet 0  . Triamcinolone Acetonide 0.025 % LOTN Apply 1 application topically 3 (three) times daily. 60 mL 1  . triamterene-hydrochlorothiazide (DYAZIDE) 37.5-25 MG capsule Take 1 capsule by mouth daily.  3  . vitamin B-12 (CYANOCOBALAMIN) 1000 MCG tablet Take 500 mcg by mouth daily as  needed. Takes 0.5 tablet      No current facility-administered medications for this visit.     OBJECTIVE: Middle-aged African-American woman who appears well  Vitals:   12/20/17 1316  BP: 127/79  Pulse: 81  Resp: 18  Temp: 98.3 F (36.8 C)  SpO2: 100%     Body mass index is 29.26 kg/m.   Wt Readings from Last 3 Encounters:  12/20/17 167 lb 12.8 oz (76.1 kg)  09/29/17 177 lb 1.6 oz (80.3 kg)  09/16/17 175 lb 6.4 oz (79.6 kg)   ECOG FS:0 - Asymptomatic   Sclerae unicteric, EOMs intact Oropharynx clear and moist No cervical or supraclavicular adenopathy Lungs no rales or rhonchi Heart regular rate and rhythm Abd soft, nontender, positive bowel sounds MSK no focal spinal tenderness, no upper extremity lymphedema Neuro: nonfocal, well oriented, appropriate affect Breasts: Status post right lumpectomy and radiation, with no evidence of disease recurrence.  Left breast is benign.  Both axillae are benign.   LAB RESULTS:  CMP     Component Value Date/Time   NA 141 12/20/2017 1258   K 3.9 12/20/2017 1258   CL 106 12/20/2017 1258   CO2 26 12/20/2017 1258   GLUCOSE 100 (H) 12/20/2017 1258   BUN 22 12/20/2017 1258   CREATININE 1.25 (H) 12/20/2017 1258   CREATININE 1.38 (H) 08/03/2017 1114   CALCIUM 9.5 12/20/2017 1258   PROT 7.6 12/20/2017 1258   ALBUMIN 3.8 12/20/2017 1258   AST 24 12/20/2017 1258   AST 30 08/03/2017 1114   ALT 23 12/20/2017 1258   ALT 31 08/03/2017 1114   ALKPHOS 86 12/20/2017 1258   BILITOT 0.4 12/20/2017 1258   BILITOT 0.5 08/03/2017 1114   GFRNONAA 44 (L) 12/20/2017 1258   GFRNONAA 40 (L) 08/03/2017 1114   GFRAA 52 (L) 12/20/2017 1258   GFRAA 46 (L) 08/03/2017 1114    No results found for: TOTALPROTELP, ALBUMINELP, A1GS, A2GS, BETS, BETA2SER, GAMS, MSPIKE, SPEI  No results found for: KPAFRELGTCHN, LAMBDASER, KAPLAMBRATIO  Lab Results  Component Value Date   WBC 4.7 12/20/2017   NEUTROABS 3.6 12/20/2017   HGB 10.5 (L) 12/20/2017   HCT  32.3 (L) 12/20/2017   MCV 80.1 12/20/2017   PLT 162 12/20/2017    @LASTCHEMISTRY @  No results found for: LABCA2  No components found for: [id number]  No results for input(s): INR in the last 168 hours.  No results found for: LABCA2  No results found for: MBW466  No results found for: ZLD357  No results found for: HER740  No results found for: CA2729  No components found for: HGQUANT  No results found for: CEA1 / No results found for: CEA1   No results found for: AFPTUMOR  No results found for: [place]  No results found for: PSA1  Appointment on 12/20/2017  Component Date Value Ref Range Status  . WBC 12/20/2017 4.7  3.9 - 10.3 K/uL Final  . RBC 12/20/2017 4.03  3.70 - 5.45 MIL/uL Final  . Hemoglobin 12/20/2017 10.5* 11.6 - 15.9 g/dL Final  . HCT 12/20/2017 32.3* 34.8 - 46.6 % Final  . MCV 12/20/2017 80.1  79.5 - 101.0 fL Final  . MCH 12/20/2017 26.0  25.1 - 34.0 pg Final  . MCHC 12/20/2017 32.5  31.5 - 36.0 g/dL Final  . RDW 12/20/2017 14.8* 11.2 - 14.5 % Final  . Platelets 12/20/2017 162  145 - 400 K/uL Final  . Neutrophils Relative % 12/20/2017 76  % Final  . Neutro Abs 12/20/2017 3.6  1.5 - 6.5 K/uL Final  . Lymphocytes Relative 12/20/2017 9  % Final  . Lymphs Abs 12/20/2017 0.4* 0.9 - 3.3 K/uL Final  . Monocytes Relative 12/20/2017 13  % Final  . Monocytes Absolute 12/20/2017 0.6  0.1 - 0.9 K/uL Final  . Eosinophils Relative 12/20/2017 1  % Final  . Eosinophils Absolute 12/20/2017 0.1  0.0 - 0.5 K/uL Final  . Basophils Relative 12/20/2017 1  % Final  . Basophils Absolute 12/20/2017 0.0  0.0 - 0.1 K/uL Final   Performed at [facility], [place] [address] [place], [place] [address]  . Sodium 12/20/2017 141  135 - 145 mmol/L Final  . Potassium 12/20/2017 3.9  3.5 - 5.1 mmol/L Final  . Chloride 12/20/2017 106  98 - 111 mmol/L Final  . CO2 12/20/2017 26  22 - 32 mmol/L Final  . Glucose, Bld 12/20/2017 100* 70 - 99 mg/dL Final  . BUN  12/20/2017 22  8 - 23 mg/dL Final  . Creatinine, Ser 12/20/2017 1.25* 0.44 - 1.00 mg/dL Final  . Calcium 12/20/2017 9.5  8.9 - 10.3 mg/dL Final  . Total Protein 12/20/2017 7.6  6.5 - 8.1 g/dL Final  . Albumin 12/20/2017 3.8  3.5 - 5.0 g/dL Final  . AST 12/20/2017 24  15 - 41 U/L Final  . ALT 12/20/2017 23  0 - 44 U/L Final  . Alkaline Phosphatase 12/20/2017 86  38 - 126 U/L Final  . Total Bilirubin 12/20/2017 0.4  0.3 - 1.2 mg/dL Final  . GFR calc non Af Amer 12/20/2017 44* >60 mL/min Final  . GFR calc Af Amer 12/20/2017 52* >60 mL/min Final   Comment: (NOTE) The eGFR has been calculated using the CKD EPI equation. This calculation has not been validated in all clinical situations. eGFR's persistently <60 mL/min signify possible Chronic Kidney Disease.   [name] Hahn gap 12/20/2017 9  5 - 15 Final   Performed at [facility], [place] [address], [place], [place] [address]    (this displays the last labs from the last 3 days)  No results found for: TOTALPROTELP, ALBUMINELP, A1GS, A2GS, BETS, BETA2SER, GAMS, MSPIKE, SPEI (this displays SPEP labs)  No results found for: KPAFRELGTCHN, LAMBDASER, KAPLAMBRATIO (kappa/lambda light chains)  No results found for: HGBA, HGBA2QUANT, HGBFQUANT, HGBSQUAN (Hemoglobinopathy evaluation)   No results found for: LDH  Lab Results  Component Value Date   IRON 170 (H) 07/12/2017   TIBC 303 07/12/2017   IRONPCTSAT 56  07/12/2017   (Iron and TIBC)  Lab Results  Component Value Date   FERRITIN 685 (H) 07/12/2017    Urinalysis No results found for: COLORURINE, APPEARANCEUR, LABSPEC, PHURINE, GLUCOSEU, HGBUR, BILIRUBINUR, KETONESUR, PROTEINUR, UROBILINOGEN, NITRITE, LEUKOCYTESUR   STUDIES: No results found.  ELIGIBLE FOR AVAILABLE RESEARCH PROTOCOL: no  ASSESSMENT: 62 y.o. [place] Woman status post right breast upper outer quadrant biopsy 05/02/2017 for a clinical T2 N1-2, stage IIIB invasive ductal carcinoma, grade  2-3, triple negative, with an MIB-1 80%  (a) breast MRI 05/18/2017 shows T3 N1 disease with possible involvement of the internal mammary nodes  (1) neoadjuvant chemotherapy consisting of doxorubicin and cyclophosphamide in dose dense fashion x4 starting 05/24/2017, completed 07/05/2017  (a) planned carboplatin/paclitaxel x12 omitted secondary to peripheral neuropathy concerns  (2) status post right lumpectomy 08/30/2017 showing a complete pathologic response (ypT0 ypN0)  (a) a total of 5 lymph nodes were removed  (3) adjuvant radiation :10/12/2017-11/28/2017 Site/dose:   1. Right breast, Ax_SCV, 1.8 Gy in 25 fractions for a total dose of 45 Gy.                       2. Right breast, 1.8 Gy in 28 fractions for a total dose of 50.4 Gy                      3. Boost, 2 Gy in 5 fractions for a total dose of 10 Gy  (4) Genetics testing offered through [facility] Multi-cancer Panel on 08/03/2017 showed a pathogenic variant in PALB2 ([id number] (p.Gln60Argfs*7)  (a) there was a VUS identified in CDH1  (b) no additional deleterious mutations were noted in ALK, APC, ATM, AXIN2, BAP1, BARD1, BLM, BMPR1A, BRCA1, BRCA2, BRIP1, CASR, CDC73, CDH1, CDK4, CDKN1B, CDKN1C, CDKN2A (p14ARF), CDKN2A (p16INK4a), CEBPA, CHEK2, CTNNA1, DICER1, DIS3L2, EPCAM*, FH, FLCN, GATA2, GPC3, GREM1*, HRAS, KIT, MAX, MEN1, MET, MLH1, MSH2, MSH3, MSH6, MUTYH, NBN, NF1, NF2, PALB2, PDGFRA, PHOX2B*, PMS2, POLD1, POLE, POT1, PRKAR1A, PTCH1, PTEN, RAD50, RAD51C, RAD51D, RB1, RECQL4, RET, RUNX1, SDHAF2, SDHB, SDHC, SDHD, SMAD4, SMARCA4, SMARCB1, SMARCE1, STK11, SUFU, TERC, TERT, TMEM127, TP53, TSC1, TSC2, VHL, WRN*, WT1. The following genes were evaluated for sequence changes only: EGFR*, HOXB13*, MITF*, NTHL1*, SDHA Results are negative unless otherwise indicated  (c) per NCCN guidelines, PALB2 mutations are not associated with increased ovarian cancer risk  (5) PALB2: intensified screening:  (a) mammography with tomography every  November  (b) breast MRI every May  (c) family is aware and those who wish to be tested have been tested  (6) PALB2: breast cancer prophylaxis: Patient opted against antiestrogens  (7) thalassemia: With MCV of 76.5 and ferritin 685 on April 2019  PLAN: I spent approximately 30 minutes face to face with Emily Perez with more than 50% of that time spent in counseling and coordination of care.  We discussed tamoxifen and anastrozole in detail.  She has a very good understanding of the possible toxicities, side effects and complications of these agents.  She understands she would be taking them for prophylaxis because of the risk of her developing another breast cancer in the future given her a PALB 2 mutation.  She understands this would reduce her risk of developing another breast cancer by half.  With a good understanding of all that she really does not want to take any antiestrogens because of concerns regarding hot flashes.  We are pursuing intensified screening.  She will have her mammography next month and then a breast MRI  in May.  We will continue that pattern indefinitely  I reviewed her MCV is and ferritin.  These results are consistent with a thalassemia trait.  This is not so much a concern for her if 1 of her children does carry the trait and married someone else who also carries the trait it might be significant.  I am going to see her again late May 2020, after her breast MRI, and subsequently on a yearly basis  She knows to call for any other issues that may develop before the next visit.   Magrinat, Virgie Dad, MD  12/20/17 1:49 PM Medical Oncology and Hematology [facility] [address] [place], [place] [address] Tel. [phone]    Fax. [phone]  Alice Rieger, am acting as scribe for Chauncey Cruel MD.  I, Lurline Del MD, have reviewed the above documentation for accuracy and completeness, and I agree with the above.

## 2017-12-20 ENCOUNTER — Inpatient Hospital Stay (HOSPITAL_BASED_OUTPATIENT_CLINIC_OR_DEPARTMENT_OTHER): Payer: BC Managed Care – PPO | Admitting: Oncology

## 2017-12-20 ENCOUNTER — Inpatient Hospital Stay: Payer: BC Managed Care – PPO | Attending: Oncology

## 2017-12-20 VITALS — BP 127/79 | HR 81 | Temp 98.3°F | Resp 18 | Ht 63.5 in | Wt 167.8 lb

## 2017-12-20 DIAGNOSIS — Z803 Family history of malignant neoplasm of breast: Secondary | ICD-10-CM | POA: Insufficient documentation

## 2017-12-20 DIAGNOSIS — Z79899 Other long term (current) drug therapy: Secondary | ICD-10-CM | POA: Insufficient documentation

## 2017-12-20 DIAGNOSIS — Z9221 Personal history of antineoplastic chemotherapy: Secondary | ICD-10-CM | POA: Insufficient documentation

## 2017-12-20 DIAGNOSIS — Z923 Personal history of irradiation: Secondary | ICD-10-CM

## 2017-12-20 DIAGNOSIS — I10 Essential (primary) hypertension: Secondary | ICD-10-CM | POA: Insufficient documentation

## 2017-12-20 DIAGNOSIS — Z171 Estrogen receptor negative status [ER-]: Secondary | ICD-10-CM | POA: Insufficient documentation

## 2017-12-20 DIAGNOSIS — D563 Thalassemia minor: Secondary | ICD-10-CM | POA: Insufficient documentation

## 2017-12-20 DIAGNOSIS — Z853 Personal history of malignant neoplasm of breast: Secondary | ICD-10-CM

## 2017-12-20 DIAGNOSIS — D569 Thalassemia, unspecified: Secondary | ICD-10-CM | POA: Diagnosis not present

## 2017-12-20 DIAGNOSIS — C50919 Malignant neoplasm of unspecified site of unspecified female breast: Secondary | ICD-10-CM | POA: Insufficient documentation

## 2017-12-20 DIAGNOSIS — C50511 Malignant neoplasm of lower-outer quadrant of right female breast: Secondary | ICD-10-CM

## 2017-12-20 DIAGNOSIS — Z7982 Long term (current) use of aspirin: Secondary | ICD-10-CM | POA: Diagnosis not present

## 2017-12-20 DIAGNOSIS — Z8042 Family history of malignant neoplasm of prostate: Secondary | ICD-10-CM | POA: Diagnosis not present

## 2017-12-20 LAB — COMPREHENSIVE METABOLIC PANEL
ALT: 23 U/L (ref 0–44)
AST: 24 U/L (ref 15–41)
Albumin: 3.8 g/dL (ref 3.5–5.0)
Alkaline Phosphatase: 86 U/L (ref 38–126)
Anion gap: 9 (ref 5–15)
BUN: 22 mg/dL (ref 8–23)
CO2: 26 mmol/L (ref 22–32)
Calcium: 9.5 mg/dL (ref 8.9–10.3)
Chloride: 106 mmol/L (ref 98–111)
Creatinine, Ser: 1.25 mg/dL — ABNORMAL HIGH (ref 0.44–1.00)
GFR calc Af Amer: 52 mL/min — ABNORMAL LOW (ref >60)
GFR calc non Af Amer: 44 mL/min — ABNORMAL LOW (ref >60)
Glucose, Bld: 100 mg/dL — ABNORMAL HIGH (ref 70–99)
Potassium: 3.9 mmol/L (ref 3.5–5.1)
Sodium: 141 mmol/L (ref 135–145)
Total Bilirubin: 0.4 mg/dL (ref 0.3–1.2)
Total Protein: 7.6 g/dL (ref 6.5–8.1)

## 2017-12-20 LAB — CBC WITH DIFFERENTIAL/PLATELET
Basophils Absolute: 0 10*3/uL (ref 0.0–0.1)
Basophils Relative: 1 %
Eosinophils Absolute: 0.1 10*3/uL (ref 0.0–0.5)
Eosinophils Relative: 1 %
HCT: 32.3 % — ABNORMAL LOW (ref 34.8–46.6)
Hemoglobin: 10.5 g/dL — ABNORMAL LOW (ref 11.6–15.9)
Lymphocytes Relative: 9 %
Lymphs Abs: 0.4 10*3/uL — ABNORMAL LOW (ref 0.9–3.3)
MCH: 26 pg (ref 25.1–34.0)
MCHC: 32.5 g/dL (ref 31.5–36.0)
MCV: 80.1 fL (ref 79.5–101.0)
Monocytes Absolute: 0.6 10*3/uL (ref 0.1–0.9)
Monocytes Relative: 13 %
Neutro Abs: 3.6 10*3/uL (ref 1.5–6.5)
Neutrophils Relative %: 76 %
Platelets: 162 10*3/uL (ref 145–400)
RBC: 4.03 MIL/uL (ref 3.70–5.45)
RDW: 14.8 % — ABNORMAL HIGH (ref 11.2–14.5)
WBC: 4.7 10*3/uL (ref 3.9–10.3)

## 2017-12-20 NOTE — Addendum Note (Signed)
Addended by: Chauncey Cruel on: 12/20/2017 02:09 PM   Modules accepted: Orders

## 2017-12-21 LAB — VITAMIN D 25 HYDROXY (VIT D DEFICIENCY, FRACTURES): Vit D, 25-Hydroxy: 28 ng/mL — ABNORMAL LOW (ref 30.0–100.0)

## 2017-12-27 ENCOUNTER — Encounter: Payer: Self-pay | Admitting: *Deleted

## 2017-12-29 ENCOUNTER — Encounter: Payer: Self-pay | Admitting: Radiation Oncology

## 2017-12-29 ENCOUNTER — Other Ambulatory Visit: Payer: Self-pay

## 2017-12-29 ENCOUNTER — Ambulatory Visit
Admission: RE | Admit: 2017-12-29 | Discharge: 2017-12-29 | Disposition: A | Discharge status: Home / Self Care | Payer: BC Managed Care – PPO | Source: Ambulatory Visit | Attending: Radiation Oncology | Admitting: Radiation Oncology

## 2017-12-29 VITALS — BP 121/70 | HR 88 | Temp 97.9°F | Resp 18 | Ht 63.5 in | Wt 169.2 lb

## 2017-12-29 DIAGNOSIS — Z923 Personal history of irradiation: Secondary | ICD-10-CM | POA: Diagnosis not present

## 2017-12-29 DIAGNOSIS — Z79899 Other long term (current) drug therapy: Secondary | ICD-10-CM | POA: Diagnosis not present

## 2017-12-29 DIAGNOSIS — C50511 Malignant neoplasm of lower-outer quadrant of right female breast: Secondary | ICD-10-CM | POA: Insufficient documentation

## 2017-12-29 DIAGNOSIS — Z171 Estrogen receptor negative status [ER-]: Secondary | ICD-10-CM | POA: Insufficient documentation

## 2017-12-29 DIAGNOSIS — Z7982 Long term (current) use of aspirin: Secondary | ICD-10-CM | POA: Diagnosis not present

## 2017-12-29 NOTE — Progress Notes (Signed)
Pt presents today for one month f/u with Dr. Sondra Come. Pt is unaccompanied. Pt reports fall at work in September of 2018 and continues to be under the care of orthopedic physician. Pt reports torn rotator cuff and meniscus injury to knee - both RIGHT side.   Pt reports fatigue is slowly improving and reports fatigue as mild to moderate. Pt reports pain has mostly subsided. Describes pain as occasional sharp pain. Pt is using Sonafine on breast. Breast with slight hyperpigmentation and swelling throughout, including nipple area.   BP 121/70 (BP Location: Left Arm, Patient Position: Sitting)   Pulse 88   Temp 97.9 F (36.6 C) (Oral)   Resp 18   Ht 5' 3.5" (1.613 m)   Wt 169 lb 3.2 oz (76.7 kg)   SpO2 100%   BMI 29.50 kg/m   Wt Readings from Last 3 Encounters:  12/29/17 169 lb 3.2 oz (76.7 kg)  12/20/17 167 lb 12.8 oz (76.1 kg)  09/29/17 177 lb 1.6 oz (80.3 kg)   Loma Sousa, RN BSN

## 2017-12-29 NOTE — Progress Notes (Signed)
Radiation Oncology         [phone] ________________________________  Name: Emily Perez MRN: [id number]  Date: 12/29/2017  DOB: Oct 13, 1955  Follow-Up Visit Note  CC: Emily Frees, MD  Emily Frees, MD    ICD-10-CM   1. Malignant neoplasm of lower-outer quadrant of right breast of female, estrogen receptor negative ([place]) C50.511    Z17.1     Diagnosis:   Clinical stage III-B invasive ductal carcinoma the right breast (cT2, cN1, M0), triple negative (ypT0, ypN0)   Interval Since Last Radiation: 1 month and 3 days 10/12/17 - 11/28/17:  1. Right breast, Ax_SCV, 45 Gy in 25 fractions / 3D, 10X//6X 2. Right breast, 50.4 Gy in 28 fractions / 3D, 6X//10X 3. Boost, 10 Gy in 5 fractions / Electron, 18E  Narrative:  The patient returns today for routine follow-up.  She last saw Dr. Jana Hakim on 12/20/17. Per his note, she is currently not on any antiestrogens and will be followed closely with intensified screening.  She reports slowly improving fatigue, notes it's still mild to moderate. She reports pain has mostly subsided, and describes it as occasional and sharp.                               ALLERGIES:  is allergic to latex.  Meds: Current Outpatient Medications  Medication Sig Dispense Refill  . aspirin 81 MG tablet Take 81 mg by mouth 3 (three) times a week.     . famotidine (PEPCID) 20 MG tablet Take 1 tablet (20 mg total) by mouth 2 (two) times daily. (Patient taking differently: Take 20 mg by mouth daily. ) 60 tablet 3  . naproxen sodium (ANAPROX) 550 MG tablet Take 550 mg by mouth daily as needed (pain). Takes 0.5-1 tablet as needed for pain     . OVER THE COUNTER MEDICATION Apply 1 application topically at bedtime as needed. Pain Relief Balm     . scopolamine (TRANSDERM-SCOP) 1 MG/3DAYS UNW AND APP 1 PA TO SKIN PRN  5  . Triamcinolone Acetonide 0.025 % LOTN Apply 1 application topically 3 (three) times daily. 60 mL 1  . triamterene-hydrochlorothiazide (DYAZIDE)  37.5-25 MG capsule Take 1 capsule by mouth daily.  3  . vitamin B-12 (CYANOCOBALAMIN) 1000 MCG tablet Take 500 mcg by mouth daily as needed. Takes 0.5 tablet     . zaleplon (SONATA) 5 MG capsule TK 1 C PO QD HS PRN  5  . Multiple Vitamins-Minerals (MULTIVITAMIN WITH MINERALS) tablet Take 1 tablet by mouth daily.     No current facility-administered medications for this encounter.     Physical Findings: The patient is in no acute distress. Patient is alert and oriented.  height is 5' 3.5" (1.613 m) and weight is 169 lb 3.2 oz (76.7 kg). Her oral temperature is 97.9 F (36.6 C). Her blood pressure is 121/70 and her pulse is 88. Her respiration is 18 and oxygen saturation is 100%.  Lungs are clear to auscultation bilaterally. Heart has regular rate and rhythm. No palpable cervical, supraclavicular, or axillary adenopathy. Abdomen soft, non-tender, normal bowel sounds. Left breast: no palpable masses, nipple discharge or bleeding. Right breast: Patient has hyperpigmentation changes to the skin. Skin is well healed. No palpable or visible signs of recurrence.    Lab Findings: Lab Results  Component Value Date   WBC 4.7 12/20/2017   HGB 10.5 (L) 12/20/2017   HCT 32.3 (L) 12/20/2017  MCV 80.1 12/20/2017   PLT 162 12/20/2017    Radiographic Findings: No results found.  Impression:  Clinical stage III-B invasive ductal carcinoma the right breast (cT2, cN1, M0), triple negative (ypT0, ypN0)  The patient is recovering from the effects of radiation. No evidence of recurrence on clinical exam. The patient's skin has healed well.   Plan:  Patient will follow up with radiation oncology in 3 months.  -----------------------------------  Blair Promise, PhD, MD  This document serves as a record of services personally performed by Gery Pray, MD. It was created on his behalf by Wilburn Mylar, a trained medical scribe. The creation of this record is based on the scribe's personal  observations and the provider's statements to them. This document has been checked and approved by the attending provider.

## 2018-01-02 ENCOUNTER — Ambulatory Visit: Payer: BC Managed Care – PPO | Admitting: Radiation Oncology

## 2018-01-09 ENCOUNTER — Telehealth: Payer: Self-pay | Admitting: *Deleted

## 2018-01-09 ENCOUNTER — Other Ambulatory Visit: Payer: Self-pay | Admitting: *Deleted

## 2018-01-09 NOTE — Telephone Encounter (Signed)
This RN spoke with pt per her VM stating she has multiple concerns -  1. Pt states she has a torn meniscus and is seeing Dr Joyice Faster - she needs to have rotator cuff repair but needs      to know your opinion since it is on the right shoulder.      She is scheduled for visit with ortho on Wednesday and is hoping to have your opinion for that visit.  2. She inquired about future mammos and MRI's - regarding if she should return to [facility] or go to the [facility]     "because when I had my genetic testing since I was positive I was told my mammograms and MRI's would be free for       the rest of my life "        Emily Perez is wanting to verify above.  3. She inquired about obtaining the Shingles Vaccine - which this RN informed her is appropriate due to not under active      Treatment at this time.  4. Emily Perez states " I have been thinking about the 5 year pill he had wanted to put me on and would like more information"     She states she has misplaced the pamphlet Dr Jana Hakim gave her previously and would like to pick up another one.      Emily Perez stated " I am willing to try the one he thinks is best that has the least side effects - primarily hot flashes and is        not too expensive."  This RN informed pt above inquiries will be given to MD for review- RN will return her call later this week.  No other issues or concerns at this time.

## 2018-01-10 ENCOUNTER — Other Ambulatory Visit: Payer: Self-pay | Admitting: Oncology

## 2018-01-10 MED ORDER — ANASTROZOLE 1 MG PO TABS: 1.0000 mg | ORAL_TABLET | Freq: Every day | ORAL | 4 refills | Status: DC

## 2018-01-10 NOTE — Progress Notes (Signed)
Emily Perez called with several questions.  I told her he was fine for her to proceed to surgery in her right shoulder.  I told her it was fine for her to receive the shingles vaccine.  I explained that future mammography and MRI may or may not be free depending on her insurance.  Finally we discussed antiestrogens and she would like to start now.  We are going to go with anastrozole.  She already has an appointment with my survivorship nurse practitioner for March 30, 2017.  If Emily Perez tolerates this well the plan will be to continue that for 5 years.  She developed significant symptoms we will stop and give tamoxifen a try.

## 2018-01-11 ENCOUNTER — Other Ambulatory Visit: Payer: Self-pay | Admitting: Orthopedic Surgery

## 2018-01-11 DIAGNOSIS — G8929 Other chronic pain: Secondary | ICD-10-CM

## 2018-01-11 DIAGNOSIS — M25511 Pain in right shoulder: Principal | ICD-10-CM

## 2018-01-12 ENCOUNTER — Encounter: Payer: Self-pay | Admitting: Genetic Counselor

## 2018-01-12 NOTE — Progress Notes (Signed)
Patient called to get clarification on her testing.  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 She thought I mentioned having free mammograms and MRI for life.  I corrected her and stated that the testing lab, [facility], offered to test her family members for 90 days after her test report, for free.  AFter 90 days there would be a charge, unless someone else had testing, tested positive, and reset the 'clock'.  I mentioned that MRI and mammograms are covered by insurance, and therefore how well they cover testing is based on the policy.  NCCN suggests that she get mammogram and MRI based on her PALB2 pos status.  She will need to talk with Dr. Jana Hakim about how he wants to follow her from here on out.  Patient voiced understanding.

## 2018-01-16 ENCOUNTER — Encounter: Payer: Self-pay | Admitting: Oncology

## 2018-01-19 ENCOUNTER — Other Ambulatory Visit: Payer: Self-pay | Admitting: *Deleted

## 2018-01-19 ENCOUNTER — Other Ambulatory Visit: Payer: BC Managed Care – PPO

## 2018-01-26 ENCOUNTER — Other Ambulatory Visit: Payer: BC Managed Care – PPO

## 2018-01-30 ENCOUNTER — Encounter: Payer: Self-pay | Admitting: Oncology

## 2018-01-30 ENCOUNTER — Other Ambulatory Visit: Payer: Self-pay | Admitting: Oncology

## 2018-01-30 NOTE — Progress Notes (Signed)
[facility]  Telephone:([phone]) [phone] Fax:(336) [phone]     ID: Emily Perez DOB: April 07, 1955  MR#: [id number]  KMM#:[id number]  Patient Care Team: Shirline Frees, MD as PCP - General (Family Medicine) Malacai Grantz, Virgie Dad, MD as Consulting Physician (Oncology) Rolm Bookbinder, MD as Consulting Physician (General Surgery) Gery Pray, MD as Consulting Physician (Radiation Oncology) Allyn Kenner, MD (Dermatology) Thurnell Lose, MD as Consulting Physician (Obstetrics and Gynecology) Marchia Bond, MD as Consulting Physician (Orthopedic Surgery) Christene Slates, MD as Physician Assistant (Radiology) OTHER MD:  CHIEF COMPLAINT: Triple negative breast cancer; PALB2 mutation  CURRENT TREATMENT: Observation  INTERVAL HISTORY: Emily Perez returns today for follow up and treatment of her triple negative breast cancer. She completed adjuvant radiant treatments on 11/28/2017. She tolerated this well.   REVIEW OF SYSTEMS: Emily Perez reports that she has been resting. For exercise, she was walking to her radiation appointments, but now she voluntarily picks a far away parking spot to walk to the store. She likes her hair. She denies unusual headaches, visual changes, nausea, vomiting, or dizziness. There has been no unusual cough, phlegm production, or pleurisy. There has been no change in bowel or bladder habits. She denies unexplained fatigue or unexplained weight loss, bleeding, rash, or fever. A detailed review of systems was otherwise stable.    HISTORY OF CURRENT ILLNESS: Emily Perez had routine bilateral screening mammography at [facility] on 04/19/2017 showing a possible abnormality in the right breast. She underwent unilateral right diagnostic mammography with tomography and right breast ultrasonography at [facility] on 04/25/2017 showing: breast density category B.  In the right breast at the 6:00 radiant 2 cm from the nipple there was a 2 cm oval mass which by ultrasound measured  2.5 cm and was irregular and hypoechoic.  There were also at least 3 abnormal appearing lymph nodes in the right axilla.  Accordingly on 05/02/2017 she proceeded to biopsy of the right breast mass in question and lymph node sampling. The pathology from this procedure showed ([id number]): Invasive ductal carcinoma grade II. One lymph node positive for metastatic carcinoma (1/1). Prognostic indicators significant for: estrogen receptor, 0% negative and progesterone receptor, 0% negative. Proliferation marker Ki67 at 80%. HER2 not amplified with ratios HER2/CEP17 signals 1.29 and average copies per cell 2.25  The patient's subsequent history is as detailed below.    PAST MEDICAL HISTORY: Past Medical History:  Diagnosis Date  . Arthritis   . Family history of breast cancer   . Family history of prostate cancer   . Headache    migraines in the past  . Hypertension   . PONV (postoperative nausea and vomiting)    after 1st colonoscopy  Migraines in the past.   PAST SURGICAL HISTORY: Past Surgical History:  Procedure Laterality Date  . BREAST LUMPECTOMY WITH RADIOACTIVE SEED AND SENTINEL LYMPH NODE BIOPSY Right 08/30/2017   Procedure: RIGHT BREAST RADIOACTIVE SEED GUIDED LUMPECTOMY WITH RIGHT RADIOACTIVE SEED TARGETED AXILLARY LYMPH NODE EXCISION AND RIGHT SENTINEL LYMPH NODE BIOPSY;  Surgeon: Rolm Bookbinder, MD;  Location: [facility];  Service: General;  Laterality: Right;  . COLONOSCOPY    . FOOT SURGERY Bilateral   . FRACTURE SURGERY Left    wrist  . PORTACATH PLACEMENT Right 05/19/2017   Procedure: INSERTION PORT-A-CATH WITH ULTRASOUND;  Surgeon: Rolm Bookbinder, MD;  Location: [place];  Service: General;  Laterality: Right;  . TUBAL LIGATION    . WISDOM TOOTH EXTRACTION      Wrist Fracture. Torn Rotator cuff and torn meniscus.  FAMILY HISTORY Family History  Problem Relation Age of Onset  . Heart disease Mother   . Cancer Father   . Diabetes Father   . Hypertension Father     . Hyperlipidemia Father   . Heart disease Brother   . Hypertension Brother   . Hyperlipidemia Brother   . Diabetes Brother   . Prostate cancer Brother 45  . Breast cancer Sister 32  . Breast cancer Sister 57  . Cancer Paternal Aunt        unsure what type of cancer she had  . Prostate cancer Paternal Uncle   The patient's father died at age 69 due to heart disease and emphysema. The patient's mother died at age 62 due to heart disease. The patient has 6 brothers and 8 sisters. She notes 2 sisters with breast cancer. The 1st sister was diagnosed at age 38, and the 2nd sister was diagnosed at age 14. She notes that both sisters are alive. She also notes a paternal first cousin with breast cancer diagnosed in her 7's. The patient notes that her father also had bone cancer, but she's unsure if he had myeloma. She denies a family history of ovarian cancer.   GYNECOLOGIC HISTORY:  No LMP recorded. Patient is postmenopausal. 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 She is divorced. She lives by herself with no pets. The patient's daughter, [name], works in Therapist, art for [employer]. The patient's son, [name],  works for [employer]. The patient has  2 grandchildren. She belongs to [facility].      ADVANCED DIRECTIVES: Not in place.  At the 05/19/2017 visit the patient was given the appropriate documents to complete and notarized at her discretion   HEALTH MAINTENANCE: Social History   Tobacco Use  . Smoking status: Never Smoker  . Smokeless tobacco: Never Used  Substance Use Topics  . Alcohol use: No  . Drug use: No     Colonoscopy:   PAP: September 2018 normal  Bone density: none   Allergies  Allergen Reactions  . Latex Hives and Rash    Current Outpatient Medications   Medication Sig Dispense Refill  . anastrozole (ARIMIDEX) 1 MG tablet Take 1 tablet (1 mg total) by mouth daily. 90 tablet 4  . aspirin 81 MG tablet Take 81 mg by mouth 3 (three) times a week.     . famotidine (PEPCID) 20 MG tablet Take 1 tablet (20 mg total) by mouth 2 (two) times daily. (Patient taking differently: Take 20 mg by mouth daily. ) 60 tablet 3  . Multiple Vitamins-Minerals (MULTIVITAMIN WITH MINERALS) tablet Take 1 tablet by mouth daily.    . naproxen sodium (ANAPROX) 550 MG tablet Take 550 mg by mouth daily as needed (pain). Takes 0.5-1 tablet as needed for pain     . OVER THE COUNTER MEDICATION Apply 1 application topically at bedtime as needed. Pain Relief Balm     . scopolamine (TRANSDERM-SCOP) 1 MG/3DAYS UNW AND APP 1 PA TO SKIN PRN  5  . Triamcinolone Acetonide 0.025 % LOTN Apply 1 application topically 3 (three) times daily. 60 mL 1  . triamterene-hydrochlorothiazide (DYAZIDE) 37.5-25 MG capsule Take 1 capsule by mouth daily.  3  . vitamin B-12 (CYANOCOBALAMIN) 1000 MCG tablet Take 500 mcg by mouth daily as needed.  Takes 0.5 tablet     . zaleplon (SONATA) 5 MG capsule TK 1 C PO QD HS PRN  5   No current facility-administered medications for this visit.     OBJECTIVE: Middle-aged African-American Perez who appears well  There were no vitals filed for this visit.   There is no height or weight on file to calculate BMI.   Wt Readings from Last 3 Encounters:  12/29/17 169 lb 3.2 oz (76.7 kg)  12/20/17 167 lb 12.8 oz (76.1 kg)  09/29/17 177 lb 1.6 oz (80.3 kg)   ECOG FS:0 - Asymptomatic   Sclerae unicteric, EOMs intact Oropharynx clear and moist No cervical or supraclavicular adenopathy Lungs no rales or rhonchi Heart regular rate and rhythm Abd soft, nontender, positive bowel sounds MSK no focal spinal tenderness, no upper extremity lymphedema Neuro: nonfocal, well oriented, appropriate affect Breasts: Status post right lumpectomy and radiation, with no evidence of  disease recurrence.  Left breast is benign.  Both axillae are benign.   LAB RESULTS:  CMP     Component Value Date/Time   NA 141 12/20/2017 1258   K 3.9 12/20/2017 1258   CL 106 12/20/2017 1258   CO2 26 12/20/2017 1258   GLUCOSE 100 (H) 12/20/2017 1258   BUN 22 12/20/2017 1258   CREATININE 1.25 (H) 12/20/2017 1258   CREATININE 1.38 (H) 08/03/2017 1114   CALCIUM 9.5 12/20/2017 1258   PROT 7.6 12/20/2017 1258   ALBUMIN 3.8 12/20/2017 1258   AST 24 12/20/2017 1258   AST 30 08/03/2017 1114   ALT 23 12/20/2017 1258   ALT 31 08/03/2017 1114   ALKPHOS 86 12/20/2017 1258   BILITOT 0.4 12/20/2017 1258   BILITOT 0.5 08/03/2017 1114   GFRNONAA 44 (L) 12/20/2017 1258   GFRNONAA 40 (L) 08/03/2017 1114   GFRAA 52 (L) 12/20/2017 1258   GFRAA 46 (L) 08/03/2017 1114    No results found for: TOTALPROTELP, ALBUMINELP, A1GS, A2GS, BETS, BETA2SER, GAMS, MSPIKE, SPEI  No results found for: KPAFRELGTCHN, LAMBDASER, KAPLAMBRATIO  Lab Results  Component Value Date   WBC 4.7 12/20/2017   NEUTROABS 3.6 12/20/2017   HGB 10.5 (L) 12/20/2017   HCT 32.3 (L) 12/20/2017   MCV 80.1 12/20/2017   PLT 162 12/20/2017    @LASTCHEMISTRY @  No results found for: LABCA2  No components found for: JGOTLX726  No results for input(s): INR in the last 168 hours.  No results found for: LABCA2  No results found for: OMB559  No results found for: RCB638  No results found for: GTX646  No results found for: CA2729  No components found for: HGQUANT  No results found for: CEA1 / No results found for: CEA1   No results found for: AFPTUMOR  No results found for: CHROMOGRNA  No results found for: PSA1  No visits with results within 3 Day(s) from this visit.  Latest known visit with results is:  Appointment on 12/20/2017  Component Date Value Ref Range Status  . WBC 12/20/2017 4.7  3.9 - 10.3 K/uL Final  . RBC 12/20/2017 4.03  3.70 - 5.45 MIL/uL Final  . Hemoglobin 12/20/2017 10.5* 11.6 - 15.9  g/dL Final  . HCT 12/20/2017 32.3* 34.8 - 46.6 % Final  . MCV 12/20/2017 80.1  79.5 - 101.0 fL Final  . MCH 12/20/2017 26.0  25.1 - 34.0 pg Final  . MCHC 12/20/2017 32.5  31.5 - 36.0 g/dL Final  . RDW 12/20/2017 14.8* 11.2 - 14.5 % Final  . Platelets 12/20/2017  162  145 - 400 K/uL Final  . Neutrophils Relative % 12/20/2017 76  % Final  . Neutro Abs 12/20/2017 3.6  1.5 - 6.5 K/uL Final  . Lymphocytes Relative 12/20/2017 9  % Final  . Lymphs Abs 12/20/2017 0.4* 0.9 - 3.3 K/uL Final  . Monocytes Relative 12/20/2017 13  % Final  . Monocytes Absolute 12/20/2017 0.6  0.1 - 0.9 K/uL Final  . Eosinophils Relative 12/20/2017 1  % Final  . Eosinophils Absolute 12/20/2017 0.1  0.0 - 0.5 K/uL Final  . Basophils Relative 12/20/2017 1  % Final  . Basophils Absolute 12/20/2017 0.0  0.0 - 0.1 K/uL Final   Performed at [facility], [place] [address], [place], [place] [address]  . Sodium 12/20/2017 141  135 - 145 mmol/L Final  . Potassium 12/20/2017 3.9  3.5 - 5.1 mmol/L Final  . Chloride 12/20/2017 106  98 - 111 mmol/L Final  . CO2 12/20/2017 26  22 - 32 mmol/L Final  . Glucose, Bld 12/20/2017 100* 70 - 99 mg/dL Final  . BUN 12/20/2017 22  8 - 23 mg/dL Final  . Creatinine, Ser 12/20/2017 1.25* 0.44 - 1.00 mg/dL Final  . Calcium 12/20/2017 9.5  8.9 - 10.3 mg/dL Final  . Total Protein 12/20/2017 7.6  6.5 - 8.1 g/dL Final  . Albumin 12/20/2017 3.8  3.5 - 5.0 g/dL Final  . AST 12/20/2017 24  15 - 41 U/L Final  . ALT 12/20/2017 23  0 - 44 U/L Final  . Alkaline Phosphatase 12/20/2017 86  38 - 126 U/L Final  . Total Bilirubin 12/20/2017 0.4  0.3 - 1.2 mg/dL Final  . GFR calc non Af Amer 12/20/2017 44* >60 mL/min Final  . GFR calc Af Amer 12/20/2017 52* >60 mL/min Final   Comment: (NOTE) The eGFR has been calculated using the CKD EPI equation. This calculation has not been validated in all clinical situations. eGFR's persistently <60 mL/min signify possible Chronic  Kidney Disease.   [name] Hahn gap 12/20/2017 9  5 - 15 Final   Performed at [facility], [place] [address], [place], [place] [address]  . Vit D, 25-Hydroxy 12/20/2017 28.0* 30.0 - 100.0 ng/mL Final   Comment: (NOTE) Vitamin D deficiency has been defined by the [place] practice guideline as a level of serum 25-OH vitamin D less than 20 ng/mL (1,2). The Endocrine Society went on to further define vitamin D insufficiency as a level between 21 and 29 ng/mL (2). 1. IOM (Institute of Medicine). 2010. Dietary reference   intakes for calcium and D. [place]: The   [employer]. 2. Holick MF, Binkley Fincastle, Bischoff-Ferrari HA, et al.   Evaluation, treatment, and prevention of vitamin D   deficiency: an Endocrine Society clinical practice   guideline. JCEM. 2011 Jul; 96(7):1911-30. Performed At: [facility] [place], [place] [id number] Rush Farmer MD [phone]     (this displays the last labs from the last 3 days)  No results found for: TOTALPROTELP, ALBUMINELP, A1GS, A2GS, BETS, BETA2SER, GAMS, MSPIKE, SPEI (this displays SPEP labs)  No results found for: KPAFRELGTCHN, LAMBDASER, KAPLAMBRATIO (kappa/lambda light chains)  No results found for: HGBA, HGBA2QUANT, HGBFQUANT, HGBSQUAN (Hemoglobinopathy evaluation)   No results found for: LDH  Lab Results  Component Value Date   IRON 170 (H) 07/12/2017   TIBC 303 07/12/2017   IRONPCTSAT 56 07/12/2017   (Iron and TIBC)  Lab Results  Component Value Date   FERRITIN 685 (H) 07/12/2017    Urinalysis No results found for: COLORURINE, APPEARANCEUR, LABSPEC, PHURINE, GLUCOSEU, HGBUR, BILIRUBINUR, KETONESUR, PROTEINUR, UROBILINOGEN, NITRITE, LEUKOCYTESUR   STUDIES: No results found.  ELIGIBLE FOR AVAILABLE RESEARCH PROTOCOL: no  ASSESSMENT: 62 y.o. Emily Perez status post right breast upper outer quadrant biopsy 05/02/2017  for a clinical T2 N1-2, stage IIIB invasive ductal carcinoma, grade 2-3, triple negative, with an MIB-1 80%  (a) breast MRI 05/18/2017 shows T3 N1 disease with possible involvement of the internal mammary nodes  (1) neoadjuvant chemotherapy consisting of doxorubicin and cyclophosphamide in dose dense fashion x4 starting 05/24/2017, completed 07/05/2017  (a) planned carboplatin/paclitaxel x12 omitted secondary to peripheral neuropathy concerns  (2) status post right lumpectomy 08/30/2017 showing a complete pathologic response (ypT0 ypN0)  (a) a total of 5 lymph nodes were removed  (3) adjuvant radiation :10/12/2017-11/28/2017 Site/dose:   1. Right breast, Ax_SCV, 1.8 Gy in 25 fractions for a total dose of 45 Gy.                       2. Right breast, 1.8 Gy in 28 fractions for a total dose of 50.4 Gy                      3. Boost, 2 Gy in 5 fractions for a total dose of 10 Gy  (4) Genetics testing offered through [facility]'s Multi-cancer Panel on 08/03/2017 showed a pathogenic variant in PALB2 ([id number] (p.[id number]*7)  (a) there was a VUS identified in CDH1  (b) no additional deleterious mutations were noted in ALK, APC, ATM, AXIN2, BAP1, BARD1, BLM, BMPR1A, BRCA1, BRCA2, BRIP1, CASR, CDC73, CDH1, CDK4, CDKN1B, CDKN1C, CDKN2A (p14ARF), CDKN2A (p16INK4a), CEBPA, CHEK2, CTNNA1, DICER1, DIS3L2, EPCAM*, FH, FLCN, GATA2, GPC3, GREM1*, HRAS, KIT, MAX, MEN1, MET, MLH1, MSH2, MSH3, MSH6, MUTYH, NBN, NF1, NF2, PALB2, PDGFRA, PHOX2B*, PMS2, POLD1, POLE, POT1, PRKAR1A, PTCH1, PTEN, RAD50, RAD51C, RAD51D, RB1, RECQL4, RET, RUNX1, SDHAF2, SDHB, SDHC, SDHD, SMAD4, SMARCA4, SMARCB1, SMARCE1, STK11, SUFU, TERC, TERT, TMEM127, TP53, TSC1, TSC2, VHL, WRN*, WT1. The following genes were evaluated for sequence changes only: EGFR*, HOXB13*, MITF*, NTHL1*, SDHA Results are negative unless otherwise indicated  (c) per NCCN guidelines, PALB2 mutations are not associated with increased ovarian cancer risk  (5) PALB2:  intensified screening:  (a) mammography with tomography every November  (b) breast MRI every May  (c) family is aware and those who wish to be tested have been tested  (6) PALB2: breast cancer prophylaxis: Patient opted against antiestrogens  (7) thalassemia: With MCV of 76.5 and ferritin 685 on April 2019  PLAN: I spent approximately 30 minutes face to face with Emily Perez with more than 50% of that time spent in counseling and coordination of care.  We discussed tamoxifen and anastrozole in detail.  She has a very good understanding of the possible toxicities, side effects and complications of these agents.  She understands she would be taking them for prophylaxis because of the risk of her developing another breast cancer in the future given her a PALB 2 mutation.  She understands this would reduce her risk of developing another breast cancer by half.  With a good understanding of all that she really does not want to take any antiestrogens because of concerns regarding hot flashes.  We are pursuing intensified screening.  She will have her mammography next month and then a breast MRI in May.  We will continue that pattern indefinitely  I reviewed her MCV is and ferritin.  These results are consistent with a thalassemia trait.  This is not so much a concern for her if 1 of her children does carry the trait and married someone else who also carries the trait it might be significant.  I am going to see her again late May 2020, after her breast MRI, and subsequently on a yearly basis  She knows to call for any other issues that may develop before the next visit.   Braxdon Gappa, Virgie Dad, MD  01/30/18 8:49 AM Medical Oncology and Hematology [facility] [address] [place], [place] [address] Tel. [phone]    Fax. [phone]  Alice Rieger, am acting as scribe for Chauncey Cruel MD.  I, Lurline Del MD, have reviewed the above documentation for accuracy and  completeness, and I agree with the above.

## 2018-02-02 ENCOUNTER — Encounter: Payer: Self-pay | Admitting: Oncology

## 2018-02-03 ENCOUNTER — Telehealth: Payer: Self-pay

## 2018-02-03 NOTE — Telephone Encounter (Signed)
Patient left voicemail for call back regarding dizziness, and weakness for several days.  Nurse returned called, patient has scheduled appointment with PCP for today.  Nurse voiced agreement with appointment.  Patient will have doctor send over any labs etc.  No further needs at this time.

## 2018-03-07 ENCOUNTER — Encounter: Payer: Self-pay | Admitting: Oncology

## 2018-03-30 ENCOUNTER — Other Ambulatory Visit: Payer: Self-pay | Admitting: Adult Health

## 2018-03-30 ENCOUNTER — Telehealth: Payer: Self-pay

## 2018-03-30 ENCOUNTER — Inpatient Hospital Stay: Payer: BC Managed Care – PPO | Attending: Oncology | Admitting: Adult Health

## 2018-03-30 ENCOUNTER — Encounter: Payer: Self-pay | Admitting: Adult Health

## 2018-03-30 VITALS — BP 123/80 | HR 98 | Temp 98.3°F | Resp 18 | Ht 63.5 in | Wt 169.2 lb

## 2018-03-30 DIAGNOSIS — Z171 Estrogen receptor negative status [ER-]: Secondary | ICD-10-CM

## 2018-03-30 DIAGNOSIS — R5383 Other fatigue: Secondary | ICD-10-CM | POA: Insufficient documentation

## 2018-03-30 DIAGNOSIS — C50511 Malignant neoplasm of lower-outer quadrant of right female breast: Secondary | ICD-10-CM | POA: Insufficient documentation

## 2018-03-30 DIAGNOSIS — Z923 Personal history of irradiation: Secondary | ICD-10-CM | POA: Insufficient documentation

## 2018-03-30 DIAGNOSIS — C50919 Malignant neoplasm of unspecified site of unspecified female breast: Secondary | ICD-10-CM

## 2018-03-30 DIAGNOSIS — Z79899 Other long term (current) drug therapy: Secondary | ICD-10-CM | POA: Diagnosis not present

## 2018-03-30 DIAGNOSIS — Z7982 Long term (current) use of aspirin: Secondary | ICD-10-CM | POA: Insufficient documentation

## 2018-03-30 DIAGNOSIS — Z8042 Family history of malignant neoplasm of prostate: Secondary | ICD-10-CM | POA: Insufficient documentation

## 2018-03-30 DIAGNOSIS — E2839 Other primary ovarian failure: Secondary | ICD-10-CM

## 2018-03-30 DIAGNOSIS — Z170 Estrogen receptor positive status [ER+]: Secondary | ICD-10-CM | POA: Insufficient documentation

## 2018-03-30 DIAGNOSIS — Z803 Family history of malignant neoplasm of breast: Secondary | ICD-10-CM | POA: Diagnosis not present

## 2018-03-30 DIAGNOSIS — Z8249 Family history of ischemic heart disease and other diseases of the circulatory system: Secondary | ICD-10-CM | POA: Insufficient documentation

## 2018-03-30 NOTE — Progress Notes (Signed)
CLINIC:  Survivorship   REASON FOR VISIT:  Routine follow-up post-treatment for a recent history of breast cancer.  BRIEF ONCOLOGIC HISTORY:    Malignant neoplasm of lower-outer quadrant of right breast of female, estrogen receptor negative ([place])   05/02/2017 Initial Diagnosis    status post right breast upper outer quadrant biopsy 05/02/2017 for a clinical T2 N1-2, stage IIIB invasive ductal carcinoma, grade 2-3, triple negative, with an MIB-1 80%             (a) breast MRI 05/18/2017 shows T3 N1 disease with possible involvement of the internal mammary nodes    05/24/2017 - 07/05/2017 Neo-Adjuvant Chemotherapy    neoadjuvant chemotherapy consisting of doxorubicin and cyclophosphamide in dose dense fashion x4 starting 05/24/2017, completed 07/05/2017             (a) planned carboplatin/paclitaxel x12 omitted secondary to peripheral neuropathy concerns    08/15/2017 Genetic Testing    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  The STAT Breast cancer panel offered by [facility] includes sequencing and rearrangement analysis for the following 9 genes:  ATM, BRCA1, BRCA2, CDH1, CHEK2, PALB2, PTEN, STK11 and TP53.   The report date is Aug 15, 2017.    08/30/2017 Surgery    status post right lumpectomy 08/30/2017 showing a complete pathologic response (ypT0 ypN0)             (a) a total of 5 lymph nodes were removed    10/12/2017 - 11/28/2017 Radiation Therapy    Site/dose:1. Right breast, Ax_SCV, 1.8 Gy in 25 fractions for a total dose of 45 Gy.  2. Right breast, 1.8 Gy in 28 fractions for a total dose of 50.4 Gy 3. Boost, 2 Gy in 5 fractions for a total dose of 10 Gy     INTERVAL HISTORY:  Ms. Wyss presents to the [facility] today for our initial meeting to review her survivorship care plan detailing her treatment course for breast cancer, as  well as monitoring long-term side effects of that treatment, education regarding health maintenance, screening, and overall wellness and health promotion.     Overall, Ms. Trivedi reports feeling quite well.  Chayil says that her energy level is still low at times, and that there are days that are worse than others.  She is still napping every day and takes it easy at times.  She also hasn't gotten out to do her usual walking.  She previously would walk miles and now is walking short stents.    Bresha has tested positive for PALB2 mutation.  Dr. Jana Hakim had discussed starting anti estrogen therapy to decrease her risk for future breast cancers.     REVIEW OF SYSTEMS:  Review of Systems  Constitutional: Positive for fatigue. Negative for appetite change and chills.  HENT:   Negative for hearing loss, lump/mass and trouble swallowing.   Eyes: Negative for eye problems and icterus.  Respiratory: Negative for chest tightness, cough and shortness of breath.   Cardiovascular: Negative for chest pain, leg swelling and palpitations.  Gastrointestinal: Negative for abdominal distention, abdominal pain, constipation, diarrhea, nausea and vomiting.  Endocrine: Negative for hot flashes.  Musculoskeletal: Negative for arthralgias.  Skin: Negative for itching and rash.  Neurological: Negative for dizziness, headaches and numbness.  Psychiatric/Behavioral: Negative for depression. The patient is not nervous/anxious.    Breast: Denies any new nodularity, masses, tenderness, nipple changes, or nipple discharge.  ONCOLOGY TREATMENT TEAM:  1. Surgeon:  Dr. Donne [facility]el at [facility] 2. Medical Oncologist: Dr. Jana Hakim  3. Radiation Oncologist: Dr. Sondra Come    PAST MEDICAL/SURGICAL HISTORY:  Past Medical History:  Diagnosis Date  . Arthritis   . Family history of breast cancer   . Family history of prostate cancer   . Headache    migraines in the past  . Hypertension   . PONV  (postoperative nausea and vomiting)    after 1st colonoscopy   Past Surgical History:  Procedure Laterality Date  . BREAST LUMPECTOMY WITH RADIOACTIVE SEED AND SENTINEL LYMPH NODE BIOPSY Right 08/30/2017   Procedure: RIGHT BREAST RADIOACTIVE SEED GUIDED LUMPECTOMY WITH RIGHT RADIOACTIVE SEED TARGETED AXILLARY LYMPH NODE EXCISION AND RIGHT SENTINEL LYMPH NODE BIOPSY;  Surgeon: Rolm Bookbinder, MD;  Location: [place];  Service: General;  Laterality: Right;  . COLONOSCOPY    . FOOT SURGERY Bilateral   . FRACTURE SURGERY Left    wrist  . PORTACATH PLACEMENT Right 05/19/2017   Procedure: INSERTION PORT-A-CATH WITH ULTRASOUND;  Surgeon: Rolm Bookbinder, MD;  Location: [place];  Service: General;  Laterality: Right;  . TUBAL LIGATION    . WISDOM TOOTH EXTRACTION       ALLERGIES:  Allergies  Allergen Reactions  . Latex Hives and Rash     CURRENT MEDICATIONS:  Outpatient Encounter Medications as of 03/30/2018  Medication Sig  . anastrozole (ARIMIDEX) 1 MG tablet Take 1 tablet (1 mg total) by mouth daily.  Marland Kitchen aspirin 81 MG tablet Take 81 mg by mouth 3 (three) times a week.   . famotidine (PEPCID) 20 MG tablet Take 1 tablet (20 mg total) by mouth 2 (two) times daily. (Patient taking differently: Take 20 mg by mouth daily. )  . Multiple Vitamins-Minerals (MULTIVITAMIN WITH MINERALS) tablet Take 1 tablet by mouth daily.  . naproxen sodium (ANAPROX) 550 MG tablet Take 550 mg by mouth daily as needed (pain). Takes 0.5-1 tablet as needed for pain   . OVER THE COUNTER MEDICATION Apply 1 application topically at bedtime as needed. Pain Relief Balm   . scopolamine (TRANSDERM-SCOP) 1 MG/3DAYS UNW AND APP 1 PA TO SKIN PRN  . Triamcinolone Acetonide 0.025 % LOTN Apply 1 application topically 3 (three) times daily.  Marland Kitchen triamterene-hydrochlorothiazide (DYAZIDE) 37.5-25 MG capsule Take 1 capsule by mouth daily.  . vitamin B-12 (CYANOCOBALAMIN) 1000 MCG tablet Take 500 mcg by mouth daily as needed. Takes 0.5  tablet   . zaleplon (SONATA) 5 MG capsule TK 1 C PO QD HS PRN   No facility-administered encounter medications on file as of 03/30/2018.      ONCOLOGIC FAMILY HISTORY:  Family History  Problem Relation Age of Onset  . Heart disease Mother   . Cancer Father   . Diabetes Father   . Hypertension Father   . Hyperlipidemia Father   . Heart disease Brother   . Hypertension Brother   . Hyperlipidemia Brother   . Diabetes Brother   . Prostate cancer Brother 74  . Breast cancer Sister 12  . Breast cancer Sister 14  . Cancer Paternal Aunt        unsure what type of cancer she had  . Prostate cancer Paternal Uncle      GENETIC COUNSELING/TESTING: See above, PALB2 positive  SOCIAL HISTORY:  Social History   Socioeconomic History  . Marital status: Legally Separated    Spouse name: Not on file  . Number of children: Not on file  . Years  of education: Not on file  . Highest education level: Not on file  Occupational History  . Not on file  Social Needs  . Financial resource strain: Not on file  . Food insecurity:    Worry: Not on file    Inability: Not on file  . Transportation needs:    Medical: Not on file    Non-medical: Not on file  Tobacco Use  . Smoking status: Never Smoker  . Smokeless tobacco: Never Used  Substance and Sexual Activity  . Alcohol use: No  . Drug use: No  . Sexual activity: Not on file  Lifestyle  . Physical activity:    Days per week: Not on file    Minutes per session: Not on file  . Stress: Not on file  Relationships  . Social connections:    Talks on phone: Not on file    Gets together: Not on file    Attends religious service: Not on file    Active member of club or organization: Not on file    Attends meetings of clubs or organizations: Not on file    Relationship status: Not on file  . Intimate partner violence:    Fear of current or ex partner: Not on file    Emotionally abused: Not on file    Physically abused: Not on file     Forced sexual activity: Not on file  Other Topics Concern  . Not on file  Social History Narrative  . Not on file     PHYSICAL EXAMINATION:  Vital Signs:   Vitals:   03/30/18 1012  BP: 123/80  Pulse: 98  Resp: 18  Temp: 98.3 F (36.8 C)  SpO2: 100%   Filed Weights   03/30/18 1012  Weight: 169 lb 3.2 oz (76.7 kg)   General: Well-nourished, well-appearing female in no acute distress.  She is unaccompanied today.   HEENT: Head is normocephalic.  Pupils equal and reactive to light. Conjunctivae clear without exudate.  Sclerae anicteric. Oral mucosa is pink, moist.  Oropharynx is pink without lesions or erythema.  Lymph: No cervical, supraclavicular, or infraclavicular lymphadenopathy noted on palpation.  Cardiovascular: Regular rate and rhythm.Marland Kitchen Respiratory: Clear to auscultation bilaterally. Chest expansion symmetric; breathing non-labored.  Breast: right breast s/p lumpectomy and radiation, left breast benign GI: Abdomen soft and round; non-tender, non-distended. Bowel sounds normoactive.  GU: Deferred.  Neuro: No focal deficits. Steady gait.  Psych: Mood and affect normal and appropriate for situation.  Extremities: No edema. MSK: No focal spinal tenderness to palpation.  Full range of motion in bilateral upper extremities Skin: Warm and dry.  LABORATORY DATA:  None for this visit.  DIAGNOSTIC IMAGING:  None for this visit.      ASSESSMENT AND PLAN:  Ms.. Barbour is a pleasant 63 y.o. female with Stage IIIB right breast invasive ductal carcinoma, ER+/PR+/HER2-, diagnosed in 04/2017, treated with lumpectomy, adjuvant radiation therapy.  She presents to the Survivorship Clinic for our initial meeting and routine follow-up post-completion of treatment for breast cancer.    1. Stage IIIB right breast cancer:  Ms. Sine is continuing to recover from definitive treatment for breast cancer. She will follow-up with her medical oncologist, Dr. Jana Hakim in 07/2018 with  history and physical exam per surveillance protocol.Today, a comprehensive survivorship care plan and treatment summary was reviewed with the patient today detailing her breast cancer diagnosis, treatment course, potential late/long-term effects of treatment, appropriate follow-up care with recommendations for the future, and patient education  resources.  A copy of this summary, along with a letter will be sent to the patient's primary care provider via mail/fax/In Basket message after today's visit.    2. Fatigue: Recommended she try taking vitamin d supplement daily and exercise regularly.  3. PALB2 positivity: She has prescription for Anastrozole prescribed for risk reduction.  She is planning on taking this after she recovers from right shoulder rotator cuff surgery.  She and I reviewed common adverse effects. She knows to call if she develops issues that she needs help with.  I also reviewed helpful tips and tricks in her SCP that can help with potential adverse effects. I also ordered her Breast MRI at [facility] imaging.    4. Bone health:  Given Ms. Dubach's age/history of breast cancer and her current treatment regimen including anti-estrogen therapy with Anastrozole, she is at risk for bone demineralization.  She tells me that she has not undergone DEXA, so I ordered this for her today.  She was given education on specific activities to promote bone health.  5. Cancer screening:  Due to Ms. Macbeth's history and her age, she should receive screening for skin cancers, colon cancer, and gynecologic cancers.  The information and recommendations are listed on the patient's comprehensive care plan/treatment summary and were reviewed in detail with the patient.    6. Health maintenance and wellness promotion: Ms. Masella was encouraged to consume 5-7 servings of fruits and vegetables per day. We reviewed the "Nutrition Rainbow" handout, as well as the handout "Take Control of Your Health and  Reduce Your Cancer Risk" from the [facility].  She was also encouraged to engage in moderate to vigorous exercise for 30 minutes per day most days of the week. We discussed the [employer] fitness program, which is designed for cancer survivors to help them become more physically fit after cancer treatments.  She was instructed to limit her alcohol consumption and continue to abstain from tobacco use.     7. Support services/counseling: It is not uncommon for this period of the patient's cancer care trajectory to be one of many emotions and stressors.  We discussed an opportunity for her to participate in the next session of [facility] ("Finding Your New Normal") support group series designed for patients after they have completed treatment.   Ms. Bolen was encouraged to take advantage of our many other support services programs, support groups, and/or counseling in coping with her new life as a cancer survivor after completing anti-cancer treatment.  She was offered support today through active listening and expressive supportive counseling.  She was given information regarding our available services and encouraged to contact me with any questions or for help enrolling in any of our support group/programs.    Dispo:   -Return to cancer center 07/2018 for f/u with Dr. Jana Hakim  -Mammogram due in 01/2019 -Breast MRI 07/2018 -Bone density due in next 1-2 months -Follow up with surgery per Dr. Donne Hazel -She is welcome to return back to the Survivorship Clinic at any time; no additional follow-up needed at this time.  -Consider referral back to survivorship as a long-term survivor for continued surveillance  A total of (30) minutes of face-to-face time was spent with this patient with greater than 50% of that time in counseling and care-coordination.   Gardenia Phlegm, [place] [phone]   Note: PRIMARY CARE PROVIDER Shirline Frees, [place] [phone]

## 2018-03-30 NOTE — Telephone Encounter (Signed)
Spoke with patient informing that she is scheduled at [facility] for her MRI on 07/24/2018 at 2:30 pm, arrival time 2:10 pm.  Bone density is scheduled for 04/13/18  2 pm, arrival 1:45 pm.  Patient voiced understanding and had no other questions.

## 2018-03-31 ENCOUNTER — Telehealth: Payer: Self-pay | Admitting: Adult Health

## 2018-03-31 NOTE — Telephone Encounter (Signed)
Per 1/9 no los °

## 2018-04-07 ENCOUNTER — Telehealth: Payer: Self-pay | Admitting: *Deleted

## 2018-04-07 NOTE — Telephone Encounter (Signed)
CALLED PATIENT TO ASK ABOUT RESCHEDLING FU FOR 04-13-18 DUE TO DR. KINARD BEING IN THE OR, RESCHEDULED FOR 2-320 @ 4 PM, LVM FOR A RETURN CALL

## 2018-04-13 ENCOUNTER — Ambulatory Visit: Payer: BC Managed Care – PPO | Admitting: Radiation Oncology

## 2018-04-18 ENCOUNTER — Telehealth: Payer: Self-pay | Admitting: Adult Health

## 2018-04-18 NOTE — Telephone Encounter (Signed)
Called patient to review her bone density test from 04/13/2018 that was consistent with osteoporosis in the AP spine.  Could not reach patient, and receive her answering machine, however her mailbox was full and I was unable to leave a message.   Plan to review her bone density, the fact that she is taking anastrozole and discuss options for bisphosphanate therapy, and reinforce calcium, vitamin d and weight bearing exercises.  Wilber Bihari, NP

## 2018-04-24 ENCOUNTER — Other Ambulatory Visit: Payer: Self-pay

## 2018-04-24 ENCOUNTER — Encounter: Payer: Self-pay | Admitting: Radiation Oncology

## 2018-04-24 ENCOUNTER — Ambulatory Visit
Admission: RE | Admit: 2018-04-24 | Discharge: 2018-04-24 | Disposition: A | Discharge status: Home / Self Care | Payer: BC Managed Care – PPO | Source: Ambulatory Visit | Attending: Radiation Oncology | Admitting: Radiation Oncology

## 2018-04-24 VITALS — BP 124/76 | HR 78 | Temp 97.9°F | Resp 20 | Ht 63.5 in | Wt 162.4 lb

## 2018-04-24 DIAGNOSIS — C50511 Malignant neoplasm of lower-outer quadrant of right female breast: Secondary | ICD-10-CM

## 2018-04-24 DIAGNOSIS — C50911 Malignant neoplasm of unspecified site of right female breast: Secondary | ICD-10-CM | POA: Insufficient documentation

## 2018-04-24 DIAGNOSIS — Z79899 Other long term (current) drug therapy: Secondary | ICD-10-CM | POA: Insufficient documentation

## 2018-04-24 DIAGNOSIS — Z171 Estrogen receptor negative status [ER-]: Secondary | ICD-10-CM | POA: Diagnosis not present

## 2018-04-24 NOTE — Progress Notes (Signed)
Pt presents today for f/u with Dr. Sondra Come. Pt is unaccompanied. Pt reports fatigue is improved since radiation but pt is still experiencing daily fatigue. Pt reports occasional sharp, shooting pains throughout inner, lower aspect of breast which are lessening in frequency. Pt is using OTC moisturizer on skin. Breast is slightly swollen and hyperpigmented.   BP 124/76 (BP Location: Left Arm, Patient Position: Sitting)   Pulse 78   Temp 97.9 F (36.6 C) (Oral)   Resp 20   Ht 5' 3.5" (1.613 m)   Wt 162 lb 6.4 oz (73.7 kg)   SpO2 100%   BMI 28.32 kg/m   Wt Readings from Last 3 Encounters:  04/24/18 162 lb 6.4 oz (73.7 kg)  03/30/18 169 lb 3.2 oz (76.7 kg)  12/29/17 169 lb 3.2 oz (76.7 kg)    Loma Sousa, RN BSN

## 2018-04-24 NOTE — Progress Notes (Signed)
Radiation Oncology         [phone] ________________________________  Name: Emily Perez MRN: [id number]  Date: 04/24/2018  DOB: 11-11-1955  Follow-Up Visit Note  CC: Shirline Frees, MD  Shirline Frees, MD    ICD-10-CM   1. Malignant neoplasm of lower-outer quadrant of right breast of female, estrogen receptor negative ([place]) C50.511    Z17.1     Diagnosis:   Clinical stage III-B invasive ductal carcinoma the right breast (cT2, cN1, M0), triple negative (ypT0, ypN0)   Interval Since Last Radiation:  5 months  Radiation treatment dates:    10/12/2017-11/28/2017  Site/dose:   1. Right breast, Ax_SCV, 1.8 Gy in 25 fractions for a total dose of 45 Gy.                       2. Right breast, 1.8 Gy in 28 fractions for a total dose of 50.4 Gy                      3. Boost, 2 Gy in 5 fractions for a total dose of 10 Gy  Narrative:  The patient returns today for routine follow-up.  she is doing well overall. She had rotator cuff surgery in November. She is in physical therapy now.   Since she was last seen in the office, she had her yearly mammogram on February 09, 2018. Results were negative for malignancy.   She had a bone density scan on April 13, 2018. Based on this scan she has osteoporosis.              On review of systems, she reports fatigue and occasional sharp, shooting pains throughout inner, lower aspect of breast which are lessening in frequency. she denies arm swelling, nipple discharge/bleeding and any other symptoms. Pertinent positives are listed and detailed within the above HPI.  She will have an MRI in May.                  ALLERGIES:  is allergic to latex.  Meds: Current Outpatient Medications  Medication Sig Dispense Refill  . aspirin 81 MG tablet Take 81 mg by mouth 3 (three) times a week.     . meclizine (ANTIVERT) 12.5 MG tablet     . Multiple Vitamins-Minerals (MULTIVITAMIN WITH MINERALS) tablet Take 1 tablet by mouth daily.    . naproxen  sodium (ANAPROX) 550 MG tablet Take 550 mg by mouth daily as needed (pain). Takes 0.5-1 tablet as needed for pain     . ondansetron (ZOFRAN-ODT) 8 MG disintegrating tablet     . OVER THE COUNTER MEDICATION Apply 1 application topically at bedtime as needed. Pain Relief Balm     . scopolamine (TRANSDERM-SCOP) 1 MG/3DAYS UNW AND APP 1 PA TO SKIN PRN  5  . Triamcinolone Acetonide 0.025 % LOTN Apply 1 application topically 3 (three) times daily. 60 mL 1  . triamterene-hydrochlorothiazide (DYAZIDE) 37.5-25 MG capsule Take 1 capsule by mouth daily.  3  . vitamin B-12 (CYANOCOBALAMIN) 1000 MCG tablet Take 500 mcg by mouth daily as needed. Takes 0.5 tablet     . zaleplon (SONATA) 5 MG capsule TK 1 C PO QD HS PRN  5  . anastrozole (ARIMIDEX) 1 MG tablet Take 1 tablet (1 mg total) by mouth daily. (Patient not taking: Reported on 04/24/2018) 90 tablet 4  . famotidine (PEPCID) 20 MG tablet Take 1 tablet (20 mg total) by mouth 2 (two) times  daily. (Patient not taking: Reported on 04/24/2018) 60 tablet 3   No current facility-administered medications for this encounter.     Physical Findings: The patient is in no acute distress. Patient is alert and oriented.  height is 5' 3.5" (1.613 m) and weight is 162 lb 6.4 oz (73.7 kg). Her oral temperature is 97.9 F (36.6 C). Her blood pressure is 124/76 and her pulse is 78. Her respiration is 20 and oxygen saturation is 100%. .  No significant changes. Lungs are clear to auscultation bilaterally. Heart has regular rate and rhythm. No palpable cervical, supraclavicular, or axillary adenopathy. Abdomen soft, non-tender, normal bowel sounds. Left breast with no palpable mass, nipple discharge, or bleeding. Right breast patient continues to have some hyperpigmentation changes. Some edema is noted to the breast. No dominant mass, nipple discharge or bleeding.     Lab Findings: Lab Results  Component Value Date   WBC 4.7 12/20/2017   HGB 10.5 (L) 12/20/2017   HCT 32.3 (L)  12/20/2017   MCV 80.1 12/20/2017   PLT 162 12/20/2017    Radiographic Findings: No results found.  Impression:  No evidence of recurrence on clinical exam.   Plan:  PRN followup in radiation oncology. Patient will continue close follow-up in medical oncology. She has not started her Arimidex as of yet  I have recommended she call to discuss results of her recent bone density study ([facility]) with Dr Jana Hakim.   ____________________________________   Blair Promise, PhD, MD    This document serves as a record of services personally performed by Gery Pray, MD. It was created on his behalf by Mary-Margaret Loma Messing, a trained medical scribe. The creation of this record is based on the scribe's personal observations and the provider's statements to them. This document has been checked and approved by the attending provider.

## 2018-06-30 ENCOUNTER — Other Ambulatory Visit: Payer: Self-pay | Admitting: Adult Health

## 2018-07-06 ENCOUNTER — Other Ambulatory Visit: Payer: BC Managed Care – PPO

## 2018-07-11 ENCOUNTER — Ambulatory Visit
Admission: RE | Admit: 2018-07-11 | Discharge: 2018-07-11 | Disposition: A | Discharge status: Home / Self Care | Payer: BC Managed Care – PPO | Source: Ambulatory Visit | Attending: Adult Health | Admitting: Adult Health

## 2018-07-11 ENCOUNTER — Other Ambulatory Visit: Payer: Self-pay

## 2018-07-11 DIAGNOSIS — Z171 Estrogen receptor negative status [ER-]: Secondary | ICD-10-CM

## 2018-07-11 DIAGNOSIS — C50919 Malignant neoplasm of unspecified site of unspecified female breast: Secondary | ICD-10-CM

## 2018-07-11 DIAGNOSIS — C50511 Malignant neoplasm of lower-outer quadrant of right female breast: Secondary | ICD-10-CM

## 2018-07-11 MED ORDER — GADOBUTROL 1 MMOL/ML IV SOLN
8.0000 mL | Freq: Once | INTRAVENOUS | Status: AC | PRN
  Administered 2018-07-11: 8 mL via INTRAVENOUS

## 2018-07-17 ENCOUNTER — Telehealth: Payer: Self-pay

## 2018-07-17 NOTE — Telephone Encounter (Signed)
-----   Message from Gardenia Phlegm, NP sent at 07/17/2018 10:44 AM EDT ----- Please call patient with results.  No cancer ----- Message ----- From: Interface, Rad Results In Sent: 07/11/2018   4:12 PM EDT To: Gardenia Phlegm, NP

## 2018-07-17 NOTE — Telephone Encounter (Signed)
LVM asking patient to call back so we can give results of MRI.

## 2018-07-18 ENCOUNTER — Encounter: Payer: Self-pay | Admitting: Adult Health

## 2018-07-24 ENCOUNTER — Other Ambulatory Visit: Payer: BC Managed Care – PPO

## 2018-08-07 ENCOUNTER — Other Ambulatory Visit: Payer: BC Managed Care – PPO

## 2018-08-15 ENCOUNTER — Telehealth: Payer: Self-pay | Admitting: *Deleted

## 2018-08-15 ENCOUNTER — Telehealth: Payer: Self-pay | Admitting: Oncology

## 2018-08-15 ENCOUNTER — Encounter: Payer: Self-pay | Admitting: Oncology

## 2018-08-15 NOTE — Telephone Encounter (Signed)
Message  VM and my chart message received per VM regarding web ex visit 08/16/2018 from pt:  I received a voicemail message regarding my appointment on tomorrow. The message included a lot of information, which was spoken in a fast-hurried manner and was not clear. Please have someone call with clear-precise information regarding my office visit on tomorrow, so I will know how to proceed.     Thank you.   Message forwarded to scheduler who left VM.

## 2018-08-15 NOTE — Telephone Encounter (Signed)
Called patient regarding upcoming Webex appointment, left patient a voicemail and e-mail has been sent.

## 2018-08-15 NOTE — Telephone Encounter (Signed)
Called and confirm appt and verify info.

## 2018-08-15 NOTE — Telephone Encounter (Signed)
Called patient regarding upcoming Webex appointment, per patient's request this needs to be a telephone visit. °

## 2018-08-15 NOTE — Telephone Encounter (Signed)
Confirmed appointment for tomorrow and verified information.

## 2018-08-15 NOTE — Progress Notes (Signed)
Emily Perez  [phone] [phone]     ID: Emily Perez DOB: [birth year]  MR#: [id number]  ZJQ#:[id number]  Patient Care Team: Shirline Frees, MD as PCP - General (Family Medicine) Antonietta Lansdowne, Virgie Dad, MD as Consulting Physician (Oncology) Rolm Bookbinder, MD as Consulting Physician (General Surgery) Gery Pray, MD as Consulting Physician (Radiation Oncology) Allyn Kenner, MD (Dermatology) Thurnell Lose, MD as Consulting Physician (Obstetrics and Gynecology) Marchia Bond, MD as Consulting Physician (Orthopedic Surgery) Christene Slates, MD as Physician Assistant (Radiology) OTHER MD:  I connected with Emily Perez on 08/16/18 at  2:00 PM EDT by telephone visit and verified that I am speaking with the correct person using two identifiers.   I discussed the limitations, risks, security and privacy concerns of performing an evaluation and management service by telemedicine and the availability of in-person appointments. I also discussed with the patient that there may be a patient responsible charge related to this service. The patient expressed understanding and agreed to proceed.   Other persons participating in the visit and their role in the encounter: Emily Perez, scribe   Patient's location: home  Provider's location: [place]    CHIEF COMPLAINT: Triple negative breast cancer; PALB2 mutation  CURRENT TREATMENT: Observation   INTERVAL HISTORY: Emily Perez was contacted today for follow up of her triple negative breast cancer.   Since her last visit, she underwent bilateral diagnostic mammography with tomography at [facility] on 02/07/2018 showing: breast density category B; no evidence of malignancy in either breast.  She also underwent bone density testing at [facility] on 04/13/2018. This showed a T-score of -2.5, which is considered osteoporotic.  She also underwent bilateral breast MRI on 07/11/2018. This showed no  MRI evidence of malignancy in either breast. Nonspecific patchy areas of increased T2 signal in the peripheral anterior right lung was also noted.   REVIEW OF SYSTEMS: Emily Perez reports doing well overall. She notes intense pain to her left hand, which has improved with hand exercises. She reports a cough in the last week but denies any fevers. She attributes the cough to allergies. She states she works to keep herself protected when she goes out-- she wears a mask, shops during early hours, keeps her distance. She reports she has gotten back into walking since completing radiation, walking 45-60 minutes 3-4 times a week.   The patient denies unusual headaches, visual changes, nausea, vomiting, stiff neck, dizziness, or gait imbalance. There has been no phlegm production, or pleurisy, no chest pain or pressure, and no change in bowel or bladder habits. The patient denies fever, rash, bleeding, unexplained fatigue or unexplained weight loss. A detailed review of systems was otherwise entirely negative.   HISTORY OF CURRENT ILLNESS: AVI ARCHULETA had routine bilateral screening mammography at [facility] on 04/19/2017 showing a possible abnormality in the right breast. She underwent unilateral right diagnostic mammography with tomography and right breast ultrasonography at [facility] on 04/25/2017 showing: breast density category B.  In the right breast at the 6:00 radiant 2 cm from the nipple there was a 2 cm oval mass which by ultrasound measured 2.5 cm and was irregular and hypoechoic.  There were also at least 3 abnormal appearing lymph nodes in the right axilla.  Accordingly on 05/02/2017 she proceeded to biopsy of the right breast mass in question and lymph node sampling. The pathology from this procedure showed ([id number]): Invasive ductal carcinoma grade II. One lymph node positive for metastatic carcinoma (1/1). Prognostic indicators significant for: estrogen  receptor, 0% negative and progesterone  receptor, 0% negative. Proliferation marker Ki67 at 80%. HER2 not amplified with ratios HER2/CEP17 signals 1.29 and average copies per cell 2.25  The patient's subsequent history is as detailed below.   PAST MEDICAL HISTORY: Past Medical History:  Diagnosis Date  . Arthritis   . Family history of breast cancer   . Family history of prostate cancer   . Headache    migraines in the past  . Hypertension   . PONV (postoperative nausea and vomiting)    after 1st colonoscopy  Migraines in the past.    PAST SURGICAL HISTORY: Past Surgical History:  Procedure Laterality Date  . BREAST LUMPECTOMY WITH RADIOACTIVE SEED AND SENTINEL LYMPH NODE BIOPSY Right 08/30/2017   Procedure: RIGHT BREAST RADIOACTIVE SEED GUIDED LUMPECTOMY WITH RIGHT RADIOACTIVE SEED TARGETED AXILLARY LYMPH NODE EXCISION AND RIGHT SENTINEL LYMPH NODE BIOPSY;  Surgeon: Rolm Bookbinder, MD;  Location: [facility];  Service: General;  Laterality: Right;  . COLONOSCOPY    . FOOT SURGERY Bilateral   . FRACTURE SURGERY Left    wrist  . PORTACATH PLACEMENT Right 05/19/2017   Procedure: INSERTION PORT-A-CATH WITH ULTRASOUND;  Surgeon: Rolm Bookbinder, MD;  Location: [facility];  Service: General;  Laterality: Right;  . TUBAL LIGATION    . WISDOM TOOTH EXTRACTION     Wrist Fracture. Torn Rotator cuff and torn meniscus.    FAMILY HISTORY Family History  Problem Relation Age of Onset  . Heart disease Mother   . Cancer Father   . Diabetes Father   . Hypertension Father   . Hyperlipidemia Father   . Heart disease Brother   . Hypertension Brother   . Hyperlipidemia Brother   . Diabetes Brother   . Prostate cancer Brother 54  . Breast cancer Sister 24  . Breast cancer Sister 3  . Cancer Paternal Aunt        unsure what type of cancer she had  . Prostate cancer Paternal Uncle   The patient's father died at age 32 due to heart disease and emphysema. The patient's mother died at age 38 due to heart disease. The patient has 6  brothers and 8 sisters. She notes 2 sisters with breast cancer. The 1st sister was diagnosed at age 25, and the 2nd sister was diagnosed at age 45. She notes that both sisters are alive. She also notes a paternal first cousin with breast cancer diagnosed in her 62's. The patient notes that her father also had bone cancer, but she's unsure if he had myeloma. She denies a family history of ovarian cancer.    GYNECOLOGIC HISTORY:  No LMP recorded. Patient is postmenopausal. 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 She is divorced. She lives by herself with no pets. The patient's daughter, [name], works in Therapist, art for [employer]. The patient's son, [name],  works for [employer]. The patient has  2 grandchildren. She belongs to [facility].     ADVANCED DIRECTIVES: Not in place.  At the 05/19/2017 visit the patient was given the appropriate documents to complete and notarized at her discretion   HEALTH MAINTENANCE: Social History   Tobacco Use  . Smoking status: Never Smoker  . Smokeless tobacco: Never Used  Substance Use Topics  . Alcohol use: No  .  Drug use: No     Colonoscopy:   PAP: September 2018 normal  Bone density: none   Allergies  Allergen Reactions  . Latex Hives and Rash    Current Outpatient Medications  Medication Sig Dispense Refill  . anastrozole (ARIMIDEX) 1 MG tablet Take 1 tablet (1 mg total) by mouth daily. (Patient not taking: Reported on 04/24/2018) 90 tablet 4  . aspirin 81 MG tablet Take 81 mg by mouth 3 (three) times a week.     . famotidine (PEPCID) 20 MG tablet Take 1 tablet (20 mg total) by mouth 2 (two) times daily. (Patient not taking: Reported on 04/24/2018) 60 tablet 3  . meclizine (ANTIVERT) 12.5 MG tablet     . Multiple Vitamins-Minerals  (MULTIVITAMIN WITH MINERALS) tablet Take 1 tablet by mouth daily.    . naproxen sodium (ANAPROX) 550 MG tablet Take 550 mg by mouth daily as needed (pain). Takes 0.5-1 tablet as needed for pain     . ondansetron (ZOFRAN-ODT) 8 MG disintegrating tablet     . OVER THE COUNTER MEDICATION Apply 1 application topically at bedtime as needed. Pain Relief Balm     . scopolamine (TRANSDERM-SCOP) 1 MG/3DAYS UNW AND APP 1 PA TO SKIN PRN  5  . Triamcinolone Acetonide 0.025 % LOTN Apply 1 application topically 3 (three) times daily. 60 mL 1  . triamterene-hydrochlorothiazide (DYAZIDE) 37.5-25 MG capsule Take 1 capsule by mouth daily.  3  . vitamin B-12 (CYANOCOBALAMIN) 1000 MCG tablet Take 500 mcg by mouth daily as needed. Takes 0.5 tablet     . zaleplon (SONATA) 5 MG capsule TK 1 C PO QD HS PRN  5   No current facility-administered medications for this visit.     OBJECTIVE: Middle-aged African-American Perez   There were no vitals filed for this visit.   There is no height or weight on file to calculate BMI.   Wt Readings from Last 3 Encounters:  04/24/18 162 lb 6.4 oz (73.7 kg)  03/30/18 169 lb 3.2 oz (76.7 kg)  12/29/17 169 lb 3.2 oz (76.7 kg)   ECOG FS:1 - Symptomatic but completely ambulatory    LAB RESULTS:  CMP     Component Value Date/Time   NA 141 12/20/2017 1258   K 3.9 12/20/2017 1258   CL 106 12/20/2017 1258   CO2 26 12/20/2017 1258   GLUCOSE 100 (H) 12/20/2017 1258   BUN 22 12/20/2017 1258   CREATININE 1.25 (H) 12/20/2017 1258   CREATININE 1.38 (H) 08/03/2017 1114   CALCIUM 9.5 12/20/2017 1258   PROT 7.6 12/20/2017 1258   ALBUMIN 3.8 12/20/2017 1258   AST 24 12/20/2017 1258   AST 30 08/03/2017 1114   ALT 23 12/20/2017 1258   ALT 31 08/03/2017 1114   ALKPHOS 86 12/20/2017 1258   BILITOT 0.4 12/20/2017 1258   BILITOT 0.5 08/03/2017 1114   GFRNONAA 44 (L) 12/20/2017 1258   GFRNONAA 40 (L) 08/03/2017 1114   GFRAA 52 (L) 12/20/2017 1258   GFRAA 46 (L) 08/03/2017 1114     No results found for: TOTALPROTELP, ALBUMINELP, A1GS, A2GS, BETS, BETA2SER, GAMS, MSPIKE, SPEI  No results found for: KPAFRELGTCHN, LAMBDASER, KAPLAMBRATIO  Lab Results  Component Value Date   WBC 4.7 12/20/2017   NEUTROABS 3.6 12/20/2017   HGB 10.5 (L) 12/20/2017   HCT 32.3 (L) 12/20/2017   MCV 80.1 12/20/2017   PLT 162 12/20/2017    @LASTCHEMISTRY @  No results found for: LABCA2  No components found for: YIFOYD741  No results for input(s): INR in the last 168 hours.  No results found for: LABCA2  No results found for: YPP509  No results found for: TOI712  No results found for: WPY099  No results found for: CA2729  No components found for: HGQUANT  No results found for: CEA1 / No results found for: CEA1   No results found for: AFPTUMOR  No results found for: CHROMOGRNA  No results found for: PSA1  No visits with results within 3 Day(s) from this visit.  Latest known visit with results is:  Appointment on 12/20/2017  Component Date Value Ref Range Status  . WBC 12/20/2017 4.7  3.9 - 10.3 K/uL Final  . RBC 12/20/2017 4.03  3.70 - 5.45 MIL/uL Final  . Hemoglobin 12/20/2017 10.5* 11.6 - 15.9 g/dL Final  . HCT 12/20/2017 32.3* 34.8 - 46.6 % Final  . MCV 12/20/2017 80.1  79.5 - 101.0 fL Final  . MCH 12/20/2017 26.0  25.1 - 34.0 pg Final  . MCHC 12/20/2017 32.5  31.5 - 36.0 g/dL Final  . RDW 12/20/2017 14.8* 11.2 - 14.5 % Final  . Platelets 12/20/2017 162  145 - 400 K/uL Final  . Neutrophils Relative % 12/20/2017 76  % Final  . Neutro Abs 12/20/2017 3.6  1.5 - 6.5 K/uL Final  . Lymphocytes Relative 12/20/2017 9  % Final  . Lymphs Abs 12/20/2017 0.4* 0.9 - 3.3 K/uL Final  . Monocytes Relative 12/20/2017 13  % Final  . Monocytes Absolute 12/20/2017 0.6  0.1 - 0.9 K/uL Final  . Eosinophils Relative 12/20/2017 1  % Final  . Eosinophils Absolute 12/20/2017 0.1  0.0 - 0.5 K/uL Final  . Basophils Relative 12/20/2017 1  % Final  . Basophils Absolute 12/20/2017 0.0   0.0 - 0.1 K/uL Final   Performed at [facility], [place] [address] [place], [place] [address]  . Sodium 12/20/2017 141  135 - 145 mmol/L Final  . Potassium 12/20/2017 3.9  3.5 - 5.1 mmol/L Final  . Chloride 12/20/2017 106  98 - 111 mmol/L Final  . CO2 12/20/2017 26  22 - 32 mmol/L Final  . Glucose, Bld 12/20/2017 100* 70 - 99 mg/dL Final  . BUN 12/20/2017 22  8 - 23 mg/dL Final  . Creatinine, Ser 12/20/2017 1.25* 0.44 - 1.00 mg/dL Final  . Calcium 12/20/2017 9.5  8.9 - 10.3 mg/dL Final  . Total Protein 12/20/2017 7.6  6.5 - 8.1 g/dL Final  . Albumin 12/20/2017 3.8  3.5 - 5.0 g/dL Final  . AST 12/20/2017 24  15 - 41 U/L Final  . ALT 12/20/2017 23  0 - 44 U/L Final  . Alkaline Phosphatase 12/20/2017 86  38 - 126 U/L Final  . Total Bilirubin 12/20/2017 0.4  0.3 - 1.2 mg/dL Final  . GFR calc non Af Amer 12/20/2017 44* >60 mL/min Final  . GFR calc Af Amer 12/20/2017 52* >60 mL/min Final   Comment: (NOTE) The eGFR has been calculated using the CKD EPI equation. This calculation has not been validated in all clinical situations. eGFR's persistently <60 mL/min signify possible Chronic Kidney Disease.   [name] Hahn gap 12/20/2017 9  5 - 15 Final   Performed at [facility], [place] [address] [place],  [address]  . Vit D, 25-Hydroxy 12/20/2017 28.0* 30.0 - 100.0 ng/mL Final   Comment: (NOTE) Vitamin D deficiency has been defined by the [place] practice guideline as a  level of serum 25-OH vitamin D less than 20 ng/mL (1,2). The Endocrine Society went on to further define vitamin D insufficiency as a level between 21 and 29 ng/mL (2). 1. IOM (Institute of Medicine). 2010. Dietary reference   intakes for calcium and D. [place]: The   [employer]. 2. Holick MF, Binkley [place], Bischoff-Ferrari HA, et al.   Evaluation, treatment, and prevention of vitamin D   deficiency: an Endocrine Society  clinical practice   guideline. JCEM. 2011 Jul; 96(7):1911-30. Performed At: [facility] [place], [place] [id number] Rush Farmer MD [phone]     (this displays the last labs from the last 3 days)  No results found for: TOTALPROTELP, ALBUMINELP, A1GS, A2GS, BETS, BETA2SER, GAMS, MSPIKE, SPEI (this displays SPEP labs)  No results found for: KPAFRELGTCHN, LAMBDASER, KAPLAMBRATIO (kappa/lambda light chains)  No results found for: HGBA, HGBA2QUANT, HGBFQUANT, HGBSQUAN (Hemoglobinopathy evaluation)   No results found for: LDH  Lab Results  Component Value Date   IRON 170 (H) 07/12/2017   TIBC 303 07/12/2017   IRONPCTSAT 56 07/12/2017   (Iron and TIBC)  Lab Results  Component Value Date   FERRITIN 685 (H) 07/12/2017    Urinalysis No results found for: COLORURINE, APPEARANCEUR, LABSPEC, PHURINE, GLUCOSEU, HGBUR, BILIRUBINUR, KETONESUR, PROTEINUR, UROBILINOGEN, NITRITE, LEUKOCYTESUR   STUDIES: No results found.  ELIGIBLE FOR AVAILABLE RESEARCH PROTOCOL: no  ASSESSMENT: 63 y.o. Emily Perez status post right breast upper outer quadrant biopsy 05/02/2017 for a clinical T2 N1-2, stage IIIB invasive ductal carcinoma, grade 2-3, triple negative, with an MIB-1 80%  (a) breast MRI 05/18/2017 shows T3 N1 disease with possible involvement of the internal mammary nodes  (1) neoadjuvant chemotherapy consisting of doxorubicin and cyclophosphamide in dose dense fashion x4 starting 05/24/2017, completed 07/05/2017  (a) planned carboplatin/paclitaxel x12 omitted secondary to peripheral neuropathy concerns  (2) status post right lumpectomy 08/30/2017 showing a complete pathologic response (ypT0 ypN0)  (a) a total of 5 lymph nodes were removed  (3) adjuvant radiation :10/12/2017-11/28/2017 Site/dose:   1. Right breast, Ax_SCV, 1.8 Gy in 25 fractions for a total dose of 45 Gy.                       2. Right breast, 1.8 Gy in 28 fractions for a total dose of  50.4 Gy                      3. Boost, 2 Gy in 5 fractions for a total dose of 10 Gy  (4) Genetics testing offered through [facility] Multi-cancer Panel on 08/03/2017 showed a pathogenic variant in PALB2 ([id number] (p.Gln60Argfs*7)  (a) there was a VUS identified in CDH1  (b) no additional deleterious mutations were noted in ALK, APC, ATM, AXIN2, BAP1, BARD1, BLM, BMPR1A, BRCA1, BRCA2, BRIP1, CASR, CDC73, CDH1, CDK4, CDKN1B, CDKN1C, CDKN2A (p14ARF), CDKN2A (p16INK4a), CEBPA, CHEK2, CTNNA1, DICER1, DIS3L2, EPCAM*, FH, FLCN, GATA2, GPC3, GREM1*, HRAS, KIT, MAX, MEN1, MET, MLH1, MSH2, MSH3, MSH6, MUTYH, NBN, NF1, NF2, PALB2, PDGFRA, PHOX2B*, PMS2, POLD1, POLE, POT1, PRKAR1A, PTCH1, PTEN, RAD50, RAD51C, RAD51D, RB1, RECQL4, RET, RUNX1, SDHAF2, SDHB, SDHC, SDHD, SMAD4, SMARCA4, SMARCB1, SMARCE1, STK11, SUFU, TERC, TERT, TMEM127, TP53, TSC1, TSC2, VHL, WRN*, WT1. The following genes were evaluated for sequence changes only: EGFR*, HOXB13*, MITF*, NTHL1*, SDHA Results are negative unless otherwise indicated  (c) per NCCN guidelines, there is insufficient data to associate PALB2 mutations with increased ovarian cancer risk; this is managed according to family  history  (5) PALB2: intensified screening:  (a) mammography with tomography every November  (b) breast MRI every May  (c) family is aware and those who wish to be tested have been tested  (6) PALB2: breast cancer prophylaxis: Patient opted against antiestrogens  (7) thalassemia: With MCV of 76.5 and ferritin 685 on April 2019  PLAN: Emily Perez is just about a year out from definitive surgery for her breast cancer with no evidence of disease recurrence.  This is favorable.  She opted against antiestrogens so we are following with observation.  Because of her risk of developing a new breast cancer we are doing intensified screening and she is up-to-date with that with her breast MRI in April of this year.  Unfortunately she had to pay almost $2000  out-of-pocket for that test.  Thankfully she will be turning 63 in July hopefully the MRI next year will be more reasonably priced.  She has symptoms suggestive of carpal tunnel on the left hand, although of course this could be focal neuropathy.  I suggested she try a wrist splint and if it does not work let us know and we could give Neurontin a try.  Separately she has a persistent cough not accompanied by fever.  She will try Pepcid for a couple of weeks and if that improves then she has good data to suggest that this is really secondary to reflux, which seems likely.  Otherwise she will see me again in late December after her next mammogram  She knows to call for any other issue that may develop before then.  Emily Perez, Virgie Dad, MD  08/16/18 1:42 PM Medical Oncology and Hematology [facility] [address] [place], [place] [address] Tel. [phone]    Fax. [phone]   I, Emily Perez, am acting as scribe for Dr. Virgie Dad. Emily Perez.  I, Lurline Del MD, have reviewed the above documentation for accuracy and completeness, and I agree with the above.

## 2018-08-16 ENCOUNTER — Other Ambulatory Visit: Payer: BC Managed Care – PPO

## 2018-08-16 ENCOUNTER — Inpatient Hospital Stay: Payer: BC Managed Care – PPO | Attending: Oncology | Admitting: Oncology

## 2018-08-16 DIAGNOSIS — Z923 Personal history of irradiation: Secondary | ICD-10-CM

## 2018-08-16 DIAGNOSIS — C50511 Malignant neoplasm of lower-outer quadrant of right female breast: Secondary | ICD-10-CM

## 2018-08-16 DIAGNOSIS — C50411 Malignant neoplasm of upper-outer quadrant of right female breast: Secondary | ICD-10-CM | POA: Diagnosis not present

## 2018-08-16 DIAGNOSIS — G620 Drug-induced polyneuropathy: Secondary | ICD-10-CM | POA: Insufficient documentation

## 2018-08-16 DIAGNOSIS — C50919 Malignant neoplasm of unspecified site of unspecified female breast: Secondary | ICD-10-CM

## 2018-08-16 DIAGNOSIS — D569 Thalassemia, unspecified: Secondary | ICD-10-CM | POA: Diagnosis not present

## 2018-08-16 DIAGNOSIS — Z7982 Long term (current) use of aspirin: Secondary | ICD-10-CM

## 2018-08-16 DIAGNOSIS — Z171 Estrogen receptor negative status [ER-]: Secondary | ICD-10-CM

## 2018-08-16 DIAGNOSIS — Z9221 Personal history of antineoplastic chemotherapy: Secondary | ICD-10-CM

## 2018-08-16 DIAGNOSIS — M810 Age-related osteoporosis without current pathological fracture: Secondary | ICD-10-CM | POA: Insufficient documentation

## 2018-08-16 DIAGNOSIS — Z79899 Other long term (current) drug therapy: Secondary | ICD-10-CM

## 2018-08-16 DIAGNOSIS — M818 Other osteoporosis without current pathological fracture: Secondary | ICD-10-CM

## 2018-08-16 DIAGNOSIS — T451X5A Adverse effect of antineoplastic and immunosuppressive drugs, initial encounter: Secondary | ICD-10-CM

## 2018-08-17 ENCOUNTER — Telehealth: Payer: Self-pay | Admitting: Oncology

## 2018-08-17 NOTE — Telephone Encounter (Signed)
Tried to reach regarding schedule °

## 2018-09-20 DIAGNOSIS — L308 Other specified dermatitis: Secondary | ICD-10-CM | POA: Diagnosis not present

## 2018-09-28 ENCOUNTER — Inpatient Hospital Stay: Payer: Medicare HMO | Attending: Oncology

## 2018-09-28 ENCOUNTER — Other Ambulatory Visit: Payer: Self-pay

## 2018-09-28 DIAGNOSIS — C50411 Malignant neoplasm of upper-outer quadrant of right female breast: Secondary | ICD-10-CM | POA: Diagnosis not present

## 2018-09-28 DIAGNOSIS — Z171 Estrogen receptor negative status [ER-]: Secondary | ICD-10-CM

## 2018-09-28 DIAGNOSIS — C50511 Malignant neoplasm of lower-outer quadrant of right female breast: Secondary | ICD-10-CM

## 2018-09-28 LAB — CBC WITH DIFFERENTIAL/PLATELET
Abs Immature Granulocytes: 0.01 10*3/uL (ref 0.00–0.07)
Basophils Absolute: 0 10*3/uL (ref 0.0–0.1)
Basophils Relative: 1 %
Eosinophils Absolute: 0.1 10*3/uL (ref 0.0–0.5)
Eosinophils Relative: 2 %
HCT: 33.2 % — ABNORMAL LOW (ref 36.0–46.0)
Hemoglobin: 10.6 g/dL — ABNORMAL LOW (ref 12.0–15.0)
Immature Granulocytes: 0 %
Lymphocytes Relative: 20 %
Lymphs Abs: 1.2 10*3/uL (ref 0.7–4.0)
MCH: 25.2 pg — ABNORMAL LOW (ref 26.0–34.0)
MCHC: 31.9 g/dL (ref 30.0–36.0)
MCV: 78.9 fL — ABNORMAL LOW (ref 80.0–100.0)
Monocytes Absolute: 0.6 10*3/uL (ref 0.1–1.0)
Monocytes Relative: 10 %
Neutro Abs: 4.1 10*3/uL (ref 1.7–7.7)
Neutrophils Relative %: 67 %
Platelets: 194 10*3/uL (ref 150–400)
RBC: 4.21 MIL/uL (ref 3.87–5.11)
RDW: 14.6 % (ref 11.5–15.5)
WBC: 6.1 10*3/uL (ref 4.0–10.5)
nRBC: 0 % (ref 0.0–0.2)

## 2018-09-28 LAB — COMPREHENSIVE METABOLIC PANEL
ALT: 14 U/L (ref 0–44)
AST: 21 U/L (ref 15–41)
Albumin: 3.8 g/dL (ref 3.5–5.0)
Alkaline Phosphatase: 91 U/L (ref 38–126)
Anion gap: 9 (ref 5–15)
BUN: 23 mg/dL (ref 8–23)
CO2: 26 mmol/L (ref 22–32)
Calcium: 8.8 mg/dL — ABNORMAL LOW (ref 8.9–10.3)
Chloride: 105 mmol/L (ref 98–111)
Creatinine, Ser: 1.11 mg/dL — ABNORMAL HIGH (ref 0.44–1.00)
GFR calc Af Amer: >60 mL/min (ref >60)
GFR calc non Af Amer: 52 mL/min — ABNORMAL LOW (ref >60)
Glucose, Bld: 94 mg/dL (ref 70–99)
Potassium: 4.1 mmol/L (ref 3.5–5.1)
Sodium: 140 mmol/L (ref 135–145)
Total Bilirubin: 0.4 mg/dL (ref 0.3–1.2)
Total Protein: 7.4 g/dL (ref 6.5–8.1)

## 2018-11-01 ENCOUNTER — Other Ambulatory Visit: Payer: Self-pay | Admitting: Oncology

## 2018-11-06 NOTE — Progress Notes (Signed)
Subjective:   PATIENT ID: Emily Perez DOB: Aug 28, 1955, MRN: [id number]   HPI  Chief Complaint  Patient presents with  . Consult    chronic cough    Reason for Visit: New consult for chronic cough  [name] is a 63 year old Perez with history of right breast cancer status post lumpectomy/chemotherapy/radiation, hypertension and hyperlipidemia who presents as a new consult for chronic cough.  Records reviewed from 06/12/2018 from PCP Dr. Shirline Perez at [facility].  Summarized as follows: Seen in telehealth for cough and headaches.  Associated with mild sinus drainage.  Treated for seasonal allergic rhinitis with Allegra  She reports an annualc non-productive cough that usually lasts from March to May that is riggered by pollen. This year pollen started in March like usual but her non-productive cough has persisted. She does not cough has frequently after pollen season. Cough is unchanged and is dry and hacky. Cough seems to worsen at night. She has new triggers including when brushing her teeth especially when she senses the [employer].  Coughing can be associated with high level emotions including agitation or laughing. She will have episodes of coughing that can last a minute it. Does not associate with laying down. Denies chest congestion. Denies wheezing and shortness of breath. Denies fevers, chills. Nothing improves the cough. She has not tried any inhalers. She is no longer taking allegra due to headaches. Denies nasal congestion.  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   Environmental exposures: Sat during construction around 15 years ago, for 15 years, chemotherapy  I have personally reviewed patient's past medical/family/social history, allergies, current medications.  Past Medical History:  Diagnosis Date  . Arthritis   . Family history of breast cancer   . Family history of prostate cancer   .  Headache    migraines in the past  . Hypertension   . PONV (postoperative nausea and vomiting)    after 1st colonoscopy     Family History  Problem Relation Age of Onset  . Heart disease Mother   . Cancer Father   . Diabetes Father   . Hypertension Father   . Hyperlipidemia Father   . Heart disease Brother   . Hypertension Brother   . Hyperlipidemia Brother   . Diabetes Brother   . Prostate cancer Brother 75  . Breast cancer Sister 43  . Breast cancer Sister 69  . Cancer Paternal Aunt        unsure what type of cancer she had  . Prostate cancer Paternal Uncle      Social History   Occupational History  . Not on file  Tobacco Use  . Smoking status: Never Smoker  . Smokeless tobacco: Never Used  Substance and Sexual Activity  . Alcohol use: No  . Drug use: No  . Sexual activity: Not on file    Allergies  Allergen Reactions  . Latex Hives and Rash     Outpatient Medications Prior to Visit  Medication Sig Dispense Refill  . anastrozole (ARIMIDEX) 1 MG tablet Take 1 tablet (1 mg total) by mouth daily. (Patient not taking: Reported on 04/24/2018) 90 tablet 4  . aspirin 81 MG tablet Take 81 mg by mouth 3 (three) times a week.     . famotidine (PEPCID) 20 MG tablet Take 1 tablet (20 mg total) by mouth 2 (two) times daily. (Patient not taking: Reported on 04/24/2018) 60 tablet 3  . meclizine (  ANTIVERT) 12.5 MG tablet     . Multiple Vitamins-Minerals (MULTIVITAMIN WITH MINERALS) tablet Take 1 tablet by mouth daily.    . naproxen sodium (ANAPROX) 550 MG tablet Take 550 mg by mouth daily as needed (pain). Takes 0.5-1 tablet as needed for pain     . ondansetron (ZOFRAN-ODT) 8 MG disintegrating tablet     . OVER THE COUNTER MEDICATION Apply 1 application topically at bedtime as needed. Pain Relief Balm     . scopolamine (TRANSDERM-SCOP) 1 MG/3DAYS UNW AND APP 1 PA TO SKIN PRN  5  . Triamcinolone Acetonide 0.025 % LOTN Apply 1 application topically 3 (three) times daily. 60 mL 1   . triamterene-hydrochlorothiazide (DYAZIDE) 37.5-25 MG capsule Take 1 capsule by mouth daily.  3  . vitamin B-12 (CYANOCOBALAMIN) 1000 MCG tablet Take 500 mcg by mouth daily as needed. Takes 0.5 tablet     . zaleplon (SONATA) 5 MG capsule TK 1 C PO QD HS PRN  5   No facility-administered medications prior to visit.     Review of Systems  Constitutional: Negative for chills, diaphoresis, fever, malaise/fatigue and weight loss.  HENT: Negative for congestion, ear pain and sore throat.   Respiratory: Positive for cough. Negative for hemoptysis, sputum production, shortness of breath and wheezing.   Cardiovascular: Negative for chest pain, palpitations and leg swelling.  Gastrointestinal: Negative for abdominal pain, heartburn and nausea.  Genitourinary: Negative for frequency.  Musculoskeletal: Negative for joint pain and myalgias.  Skin: Negative for itching and rash.  Neurological: Negative for dizziness, weakness and headaches.  Endo/Heme/Allergies: Does not bruise/bleed easily.  Psychiatric/Behavioral: Negative for depression. The patient is nervous/anxious.      Objective:   Vitals:   11/07/18 1725  BP: 134/72  Pulse: 71  Temp: 98.5 F (36.9 C)  TempSrc: Oral  SpO2: 98%  Weight: 164 lb (74.4 kg)  Height: 5' 3.5" (1.613 m)      Physical Exam: General: Well-appearing, no acute distress HENT: [place], AT Eyes: EOMI, no scleral icterus Respiratory: Clear to auscultation bilaterally.  No crackles, wheezing or rales Cardiovascular: RRR, -M/R/G, no JVD GI: BS+, soft, nontender Extremities:-Edema,-tenderness Neuro: AAO x4, CNII-XII grossly intact Skin: Intact, no rashes or bruising Psych: Normal mood, normal affect  Data Reviewed:  Imaging: CT chest 05/18/2017-normal parenchyma with no evidence of interstitial thickening/markings, no evidence of infiltrate, effusion or edema.  PFT: None on file  TTE: 05/17/2017 EF 55 to 60%.  Grade 1 diastolic dysfunction present.  No  WMA or valvular abnormalities  Labs:  CBC    Component Value Date/Time   WBC 6.1 09/28/2018 1358   RBC 4.21 09/28/2018 1358   HGB 10.6 (L) 09/28/2018 1358   HGB 10.1 (L) 09/16/2017 1349   HCT 33.2 (L) 09/28/2018 1358   PLT 194 09/28/2018 1358   PLT 206 09/16/2017 1349   MCV 78.9 (L) 09/28/2018 1358   MCH 25.2 (L) 09/28/2018 1358   MCHC 31.9 09/28/2018 1358   RDW 14.6 09/28/2018 1358   LYMPHSABS 1.2 09/28/2018 1358   MONOABS 0.6 09/28/2018 1358   EOSABS 0.1 09/28/2018 1358   BASOSABS 0.0 09/28/2018 1358   BMET    Component Value Date/Time   NA 140 09/28/2018 1358   K 4.1 09/28/2018 1358   CL 105 09/28/2018 1358   CO2 26 09/28/2018 1358   GLUCOSE 94 09/28/2018 1358   BUN 23 09/28/2018 1358   CREATININE 1.11 (H) 09/28/2018 1358   CREATININE 1.38 (H) 08/03/2017 1114   CALCIUM 8.8 (  L) 09/28/2018 1358   GFRNONAA 52 (L) 09/28/2018 1358   GFRNONAA 40 (L) 08/03/2017 1114   GFRAA >60 09/28/2018 1358   GFRAA 46 (L) 08/03/2017 1114    Imaging, labs and tests noted above have been reviewed independently by me.    Assessment & Plan:   Discussion: 63 year old Perez with history of right breast cancer status post lumpectomy/chemotherapy/radiation who presents with chronic cough x 6 months. Etiology is unknown but can be multifactorial. We discussed common causes of cough including upper airway cough syndrome, reflux and undiagnosed asthma. We discussed approach to cough including trying multiple regimens including nasal drip treatment, inhaler and reflux management. She reports she is currently in the middle of moving and would prefer to try one regimen at a time. Will start with PPI trial and arrange for PFTs.  Chronic Cough --Plan for pulmonary function test when next available. I will call with results --Recommend a trial of omeprazole 20 mg twice a day for 6 weeks  We will arrange follow-up in 6 weeks  Health Maintenance - Due for pneumococcal and influenza Immunization  History  Administered Date(s) Administered  . Zoster Recombinat (Shingrix) 01/10/2018   CT Lung Screen - Not qualified  Orders Placed This Encounter  Procedures  . Pulmonary function test    Standing Status:   Future    Standing Expiration Date:   11/07/2019    Scheduling Instructions:     Just a spirometry before and after bronchodilators-ONLY    Order Specific Question:   Where should this test be performed?    Answer:   [facility]  No orders of the defined types were placed in this encounter.   Return in about 6 weeks (around 12/19/2018).  [place], MD [facility] 11/07/2018 3:24 PM  Office Number [phone]

## 2018-11-07 ENCOUNTER — Encounter: Payer: Self-pay | Admitting: Pulmonary Disease

## 2018-11-07 ENCOUNTER — Other Ambulatory Visit: Payer: Self-pay

## 2018-11-07 ENCOUNTER — Ambulatory Visit (INDEPENDENT_AMBULATORY_CARE_PROVIDER_SITE_OTHER): Payer: Medicare HMO | Admitting: Pulmonary Disease

## 2018-11-07 VITALS — BP 134/72 | HR 71 | Temp 98.5°F | Ht 63.5 in | Wt 164.0 lb

## 2018-11-07 DIAGNOSIS — R05 Cough: Secondary | ICD-10-CM | POA: Diagnosis not present

## 2018-11-07 DIAGNOSIS — R053 Chronic cough: Secondary | ICD-10-CM

## 2018-11-07 NOTE — Patient Instructions (Addendum)
Chronic Cough --Plan for pulmonary function test when next available. I will call with results --Recommend a trial of omeprazole 20 mg twice a day for 6 weeks  We will arrange follow-up in 6 weeks   Cough, Adult Coughing is a reflex that clears your throat and your airways (respiratory system). Coughing helps to heal and protect your lungs. It is normal to cough occasionally, but a cough that happens with other symptoms or lasts a long time may be a sign of a condition that needs treatment. An acute cough may only last 2-3 weeks, while a chronic cough may last 8 or more weeks. Coughing is commonly caused by:  Infection of the respiratory systemby viruses or bacteria.  Breathing in substances that irritate your lungs.  Allergies.  Asthma.  Mucus that runs down the back of your throat (postnasal drip).  Smoking.  Acid backing up from the stomach into the esophagus (gastroesophageal reflux).  Certain medicines.  Chronic lung problems.  Other medical conditions such as heart failure or a blood clot in the lung (pulmonary embolism). Follow these instructions at home: Medicines  Take over-the-counter and prescription medicines only as told by your health care provider.  Talk with your health care provider before you take a cough suppressant medicine. Lifestyle   Avoid cigarette smoke. Do not use any products that contain nicotine or tobacco, such as cigarettes, e-cigarettes, and chewing tobacco. If you need help quitting, ask your health care provider.  Drink enough fluid to keep your urine pale yellow.  Avoid caffeine.  Do not drink alcohol if your health care provider tells you not to drink. General instructions   Pay close attention to changes in your cough. Tell your health care provider about them.  Always cover your mouth when you cough.  Avoid things that make you cough, such as perfume, candles, cleaning products, or campfire or tobacco smoke.  If the air is  dry, use a cool mist vaporizer or humidifier in your bedroom or your home to help loosen secretions.  If your cough is worse at night, try to sleep in a semi-upright position.  Rest as needed.  Keep all follow-up visits as told by your health care provider. This is important. Contact a health care provider if you:  Have new symptoms.  Cough up pus.  Have a cough that does not get better after 2-3 weeks or gets worse.  Cannot control your cough with cough suppressant medicines and you are losing sleep.  Have pain that gets worse or pain that is not helped with medicine.  Have a fever.  Have unexplained weight loss.  Have night sweats. Get help right away if:  You cough up blood.  You have difficulty breathing.  Your heartbeat is very fast. These symptoms may represent a serious problem that is an emergency. Do not wait to see if the symptoms will go away. Get medical help right away. Call your local emergency services (911 in the [place] Do not drive yourself to the hospital. Summary  Coughing is a reflex that clears your throat and your airways. It is normal to cough occasionally, but a cough that happens with other symptoms or lasts a long time may be a sign of a condition that needs treatment.  Take over-the-counter and prescription medicines only as told by your health care provider.  Always cover your mouth when you cough.  Contact a health care provider if you have new symptoms or a cough that does not get better  after 2-3 weeks or gets worse. This information is not intended to replace advice given to you by your health care provider. Make sure you discuss any questions you have with your health care provider. Document Released: 09/04/2010 Document Revised: 03/27/2018 Document Reviewed: 03/27/2018 Elsevier Patient Education  [place].

## 2018-11-09 ENCOUNTER — Encounter: Payer: Self-pay | Admitting: Pulmonary Disease

## 2018-11-09 DIAGNOSIS — R053 Chronic cough: Secondary | ICD-10-CM | POA: Insufficient documentation

## 2018-11-09 DIAGNOSIS — R05 Cough: Secondary | ICD-10-CM | POA: Insufficient documentation

## 2018-11-10 ENCOUNTER — Telehealth: Payer: Self-pay | Admitting: Pulmonary Disease

## 2018-11-10 NOTE — Telephone Encounter (Signed)
-----   Message from Amado Coe, RN sent at 11/07/2018  4:45 PM EDT ----- Regarding: PFT week of 9/21 & appt with JE JE wants this done soon as possible. Patient had to cancel the 8/31 appt due to moving and not being able to quarantine for covid swab. This PFT is not a full PFT it is only spirometry before and after bronchodilator  Thank you  Lauren

## 2018-11-15 NOTE — Telephone Encounter (Signed)
Called and spoke to pt - she will call back to r/s pft -pr

## 2018-11-17 NOTE — Telephone Encounter (Signed)
Called and spoke to pt - states that she is moving and will call us back when she has time to schedule - closed encounter -pr

## 2018-11-20 ENCOUNTER — Ambulatory Visit: Payer: BC Managed Care – PPO | Admitting: Pulmonary Disease

## 2018-11-28 DIAGNOSIS — R69 Illness, unspecified: Secondary | ICD-10-CM | POA: Diagnosis not present

## 2018-12-01 DIAGNOSIS — Z853 Personal history of malignant neoplasm of breast: Secondary | ICD-10-CM | POA: Diagnosis not present

## 2018-12-01 DIAGNOSIS — Z0000 Encounter for general adult medical examination without abnormal findings: Secondary | ICD-10-CM | POA: Diagnosis not present

## 2018-12-01 DIAGNOSIS — I10 Essential (primary) hypertension: Secondary | ICD-10-CM | POA: Diagnosis not present

## 2018-12-01 DIAGNOSIS — D569 Thalassemia, unspecified: Secondary | ICD-10-CM | POA: Diagnosis not present

## 2018-12-01 DIAGNOSIS — E7800 Pure hypercholesterolemia, unspecified: Secondary | ICD-10-CM | POA: Diagnosis not present

## 2018-12-01 DIAGNOSIS — R69 Illness, unspecified: Secondary | ICD-10-CM | POA: Diagnosis not present

## 2018-12-04 ENCOUNTER — Telehealth: Payer: Self-pay | Admitting: Pulmonary Disease

## 2018-12-04 NOTE — Telephone Encounter (Signed)
This patient needs to be scheduled for a PFT prior to scheduling a rov with JE.  Pt needs to be scheduled for covid testing per office protocol.  Routing to [place] to schedule this test.

## 2018-12-06 ENCOUNTER — Telehealth: Payer: Self-pay | Admitting: Pulmonary Disease

## 2018-12-06 ENCOUNTER — Other Ambulatory Visit: Payer: Self-pay | Admitting: Pulmonary Disease

## 2018-12-06 NOTE — Telephone Encounter (Signed)
Will fwd to the front desk for scheduling -pr

## 2018-12-06 NOTE — Telephone Encounter (Signed)
Called spoke with patient who reported that she was returning a call to our office about her "breathing test"  PFT is already scheduled for 10.8.2020 and COVID testing is already scheduled for 10.3.2020 @ 10am - both of which patient is aware.  Nothing further was needed; will sign off.

## 2018-12-06 NOTE — Telephone Encounter (Signed)
According to AVS patient to all done in 6 weeks. This puts to beginning of October, patient scheduled for PFT and follow up 12/28/18 patient also scheduled for Covid screen 10/3.   Nothing further needed at this time.

## 2018-12-23 ENCOUNTER — Other Ambulatory Visit (HOSPITAL_COMMUNITY): Payer: Medicare HMO

## 2018-12-23 ENCOUNTER — Other Ambulatory Visit (HOSPITAL_COMMUNITY)
Admission: RE | Admit: 2018-12-23 | Discharge: 2018-12-23 | Disposition: A | Discharge status: Home / Self Care | Payer: Medicare HMO | Source: Ambulatory Visit | Attending: Pulmonary Disease | Admitting: Pulmonary Disease

## 2018-12-23 DIAGNOSIS — Z20828 Contact with and (suspected) exposure to other viral communicable diseases: Secondary | ICD-10-CM | POA: Diagnosis not present

## 2018-12-23 DIAGNOSIS — Z01812 Encounter for preprocedural laboratory examination: Secondary | ICD-10-CM | POA: Insufficient documentation

## 2018-12-25 LAB — NOVEL CORONAVIRUS, NAA (HOSP ORDER, SEND-OUT TO REF LAB; TAT 18-24 HRS): SARS-CoV-2, NAA: NOT DETECTED

## 2018-12-27 DIAGNOSIS — Z23 Encounter for immunization: Secondary | ICD-10-CM | POA: Diagnosis not present

## 2018-12-28 ENCOUNTER — Other Ambulatory Visit: Payer: Self-pay

## 2018-12-28 ENCOUNTER — Ambulatory Visit (INDEPENDENT_AMBULATORY_CARE_PROVIDER_SITE_OTHER): Payer: Medicare HMO | Admitting: Pulmonary Disease

## 2018-12-28 ENCOUNTER — Ambulatory Visit: Payer: Medicare HMO

## 2018-12-28 ENCOUNTER — Encounter: Payer: Self-pay | Admitting: Pulmonary Disease

## 2018-12-28 DIAGNOSIS — R053 Chronic cough: Secondary | ICD-10-CM

## 2018-12-28 DIAGNOSIS — R05 Cough: Secondary | ICD-10-CM | POA: Diagnosis not present

## 2018-12-28 MED ORDER — OMEPRAZOLE 20 MG PO CPDR: 20.0000 mg | DELAYED_RELEASE_CAPSULE | Freq: Two times a day (BID) | ORAL | 1 refills | Status: DC

## 2018-12-28 NOTE — Progress Notes (Signed)
Subjective:   PATIENT ID: Emily Perez GENDER: female DOB: 1955/03/27, MRN: [id number]   HPI  Chief Complaint  Patient presents with  . Follow-up    Reason for Visit: F/u  [name] is a 63 year old female with history of right breast cancer status post lumpectomy/chemotherapy/radiation, hypertension and hyperlipidemia who presents as a new consult for chronic cough.  Previously evaluated for recurrent non-productive hacky cough that usually lasts from March to May that is triggered by pollen that has persisted and worse at night. New triggers including when brushing her teeth especially when she senses the minty flavor, high level emotions including agitation or laughing  Since our last visit, her cough is slightly better but remains persistent. She reports the cough is more frequent at night and noticeable in the morning. Less triggered by brushing her teeth. Denies associated shortness of breath, chest pain or wheezing. She expresses concern that her symptoms may related to interstitial lung disease like her sister. She is unaware of any family hx of autoimmune disease. She was unable to complete PFTs today due to difficulty understanding and communicating with technician during the procedure. She had not started any reflux medication yet. Denies any allergies or nasal/chest congestion.  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 Another sister with ILD  Environmental exposures: Sat during construction around 15 years ago, for 15 years, chemotherapy/radiation  I have personally reviewed patient's past medical/family/social history/allergies/current medications.  Past Medical History:  Diagnosis Date  . Allergic rhinitis   . Arthritis   . Breast cancer ([place])   . Family history of breast cancer   . Family history of prostate cancer   . Headache    migraines in the past  . Hyperlipidemia   . Hypertension   . PONV (postoperative nausea  and vomiting)    after 1st colonoscopy     Family History  Problem Relation Age of Onset  . Heart disease Mother   . Cancer Father   . Diabetes Father   . Hypertension Father   . Hyperlipidemia Father   . Emphysema Father   . Heart disease Brother   . Hypertension Brother   . Hyperlipidemia Brother   . Diabetes Brother   . Prostate cancer Brother 90  . Breast cancer Sister 84  . Breast cancer Sister 26  . Asthma Sister   . Cancer Paternal Aunt        unsure what type of cancer she had  . Prostate cancer Paternal Uncle   . Diabetes Brother   . Gout Brother   . Kidney disease Brother   . Lung disease Sister   . Breast cancer Sister      Social History   Occupational History  . Not on file  Tobacco Use  . Smoking status: Never Smoker  . Smokeless tobacco: Never Used  Substance and Sexual Activity  . Alcohol use: No  . Drug use: No  . Sexual activity: Not on file    Allergies  Allergen Reactions  . Latex Hives and Rash     Outpatient Medications Prior to Visit  Medication Sig Dispense Refill  . anastrozole (ARIMIDEX) 1 MG tablet Take 1 tablet (1 mg total) by mouth daily. (Patient not taking: Reported on 04/24/2018) 90 tablet 4  . aspirin 81 MG tablet Take 81 mg by mouth 3 (three) times a week.     . famotidine (PEPCID) 20 MG tablet Take 1 tablet (20 mg total) by mouth  2 (two) times daily. (Patient not taking: Reported on 04/24/2018) 60 tablet 3  . meclizine (ANTIVERT) 12.5 MG tablet     . Multiple Vitamins-Minerals (MULTIVITAMIN WITH MINERALS) tablet Take 1 tablet by mouth daily.    . naproxen sodium (ANAPROX) 550 MG tablet Take 550 mg by mouth daily as needed (pain). Takes 0.5-1 tablet as needed for pain     . ondansetron (ZOFRAN-ODT) 8 MG disintegrating tablet     . OVER THE COUNTER MEDICATION Apply 1 application topically at bedtime as needed. Pain Relief Balm     . scopolamine (TRANSDERM-SCOP) 1 MG/3DAYS UNW AND APP 1 PA TO SKIN PRN  5  . Triamcinolone Acetonide  0.025 % LOTN Apply 1 application topically 3 (three) times daily. (Patient not taking: Reported on 11/07/2018) 60 mL 1  . triamterene-hydrochlorothiazide (DYAZIDE) 37.5-25 MG capsule Take 1 capsule by mouth daily.  3  . vitamin B-12 (CYANOCOBALAMIN) 1000 MCG tablet Take 500 mcg by mouth daily as needed. Takes 0.5 tablet     . zaleplon (SONATA) 5 MG capsule TK 1 C PO QD HS PRN  5   No facility-administered medications prior to visit.     Review of Systems  Constitutional: Negative for chills, diaphoresis, fever, malaise/fatigue and weight loss.  HENT: Negative for congestion.   Respiratory: Positive for cough. Negative for hemoptysis, sputum production, shortness of breath and wheezing.   Cardiovascular: Negative for chest pain, leg swelling and PND.  Gastrointestinal: Positive for heartburn. Negative for abdominal pain and nausea.  Skin: Negative for rash.  Endo/Heme/Allergies: Negative for environmental allergies.     Objective:   Vitals:   12/28/18 0931 12/28/18 0932  BP:  132/70  Pulse:  74  Temp: (!) 97 F (36.1 C)   TempSrc: Temporal   SpO2:  99%  Weight: 159 lb 12.8 oz (72.5 kg)   Height: 5' 3.5" (1.613 m)    SpO2: 99 % O2 Device: None (Room air)  Physical Exam: General: Well-appearing, no acute distress HENT: [place], AT Eyes: EOMI, no scleral icterus Respiratory: Clear to auscultation bilaterally.  No crackles, wheezing or rales Cardiovascular: RRR, -M/R/G, no JVD GI: BS+, soft, nontender Extremities:-Edema,-tenderness Neuro: AAO x4, CNII-XII grossly intact Skin: Intact, no rashes or bruising Psych: Normal mood, normal affect **NEW  Data Reviewed:  Imaging: CT chest 05/18/2017-normal parenchyma with no evidence of interstitial thickening/markings, no evidence of infiltrate, effusion or edema.  PFT: None on file  TTE: 05/17/2017 EF 55 to 60%.  Grade 1 diastolic dysfunction present.  No WMA or valvular abnormalities  Labs:  CBC    Component Value Date/Time    WBC 6.1 09/28/2018 1358   RBC 4.21 09/28/2018 1358   HGB 10.6 (L) 09/28/2018 1358   HGB 10.1 (L) 09/16/2017 1349   HCT 33.2 (L) 09/28/2018 1358   PLT 194 09/28/2018 1358   PLT 206 09/16/2017 1349   MCV 78.9 (L) 09/28/2018 1358   MCH 25.2 (L) 09/28/2018 1358   MCHC 31.9 09/28/2018 1358   RDW 14.6 09/28/2018 1358   LYMPHSABS 1.2 09/28/2018 1358   MONOABS 0.6 09/28/2018 1358   EOSABS 0.1 09/28/2018 1358   BASOSABS 0.0 09/28/2018 1358   BMET    Component Value Date/Time   NA 140 09/28/2018 1358   K 4.1 09/28/2018 1358   CL 105 09/28/2018 1358   CO2 26 09/28/2018 1358   GLUCOSE 94 09/28/2018 1358   BUN 23 09/28/2018 1358   CREATININE 1.11 (H) 09/28/2018 1358   CREATININE 1.38 (H) 08/03/2017  1114   CALCIUM 8.8 (L) 09/28/2018 1358   GFRNONAA 52 (L) 09/28/2018 1358   GFRNONAA 40 (L) 08/03/2017 1114   GFRAA >60 09/28/2018 1358   GFRAA 46 (L) 08/03/2017 1114    Imaging, labs and tests noted above have been reviewed independently by me.    Assessment & Plan:   Discussion: 63 year old female with hx right breast cancer s/p lumpectomy/chemotherapy/radiation who presents with chronic cough x 6 months. Suspect multifactorial etiology. We discussed common causes of cough including upper airway cough syndrome, reflux and undiagnosed asthma. We discussed approach to cough including trying multiple regimens including nasal drip treatment, inhaler and reflux management. Unable to complete PFTs. Will start empiric PPI trial.  Chronic Cough --Will evaluate with CT Chest to rule out interstitial lung disease --START omeprazole 20 mg twice a day for at least 6 weeks. Prescription sent today  We will arrange follow-up in 6 weeks  Health Maintenance - Due for pneumococcal and influenza Immunization History  Administered Date(s) Administered  . Zoster Recombinat (Shingrix) 01/10/2018   CT Lung Screen - Not qualified  Orders Placed This Encounter  Procedures  . CT Chest High Resolution     Please scheduled 1 month SS. Chantel [id number]/ [employer]    Standing Status:   Future    Standing Expiration Date:   02/27/2020    Order Specific Question:   ** REASON FOR EXAM (FREE TEXT)    Answer:   chronic cough    Order Specific Question:   Preferred imaging location?    Answer:   [place]    Order Specific Question:   Radiology Contrast Protocol - do NOT remove file path    Answer:   [email or web address]   Meds ordered this encounter  Medications  . omeprazole (PRILOSEC) 20 MG capsule    Sig: Take 1 capsule (20 mg total) by mouth 2 (two) times daily before a meal.    Dispense:  60 capsule    Refill:  1    Return in about 6 weeks (around 02/08/2019).  [place], MD [facility] 12/28/2018 9:54 AM  Office Number [phone]

## 2018-12-28 NOTE — Patient Instructions (Addendum)
Chronic Cough --Will evaluate with CT Chest to rule out interstitial lung disease --START omeprazole 20 mg twice a day for at least 6 weeks. Prescription sent today  Please call me for any questions or concerns.

## 2018-12-31 ENCOUNTER — Encounter: Payer: Self-pay | Admitting: Pulmonary Disease

## 2019-01-18 DIAGNOSIS — C50111 Malignant neoplasm of central portion of right female breast: Secondary | ICD-10-CM | POA: Diagnosis not present

## 2019-01-18 IMAGING — NM NM BONE WHOLE BODY
2 series · 2 of 2 positions shown · non-contrast
Comparison: None

Radiographic correlation: CT chest 05/18/2017

CLINICAL DATA: Breast cancer, no bone pain or recent trauma

EXAM:
NUCLEAR MEDICINE WHOLE BODY BONE SCAN
TECHNIQUE: Whole body anterior and posterior images were obtained approximately
3 hours after intravenous injection of radiopharmaceutical.
RADIOPHARMACEUTICALS:  20.7 mCi Oechnetium-WWm MDP IV

[Series 1: wbr_bone_40 whole body · 2.66mm/px · 1 of 1 slices shown (1 of 2)]
[im 1/1]
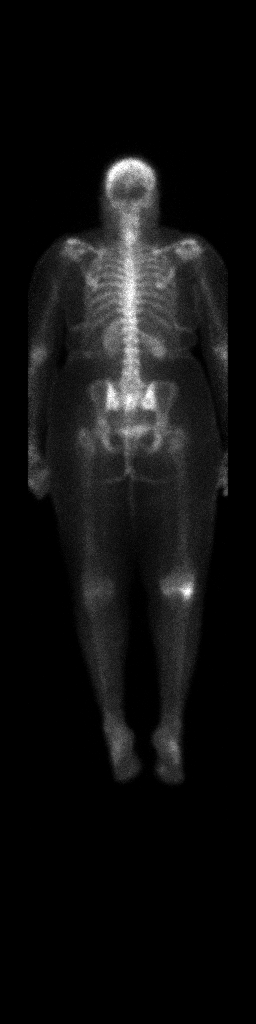

[Series 1: wbr_bone_40 whole body · 2.66mm/px · 1 of 1 slices shown (2 of 2)]
[im 1/1]
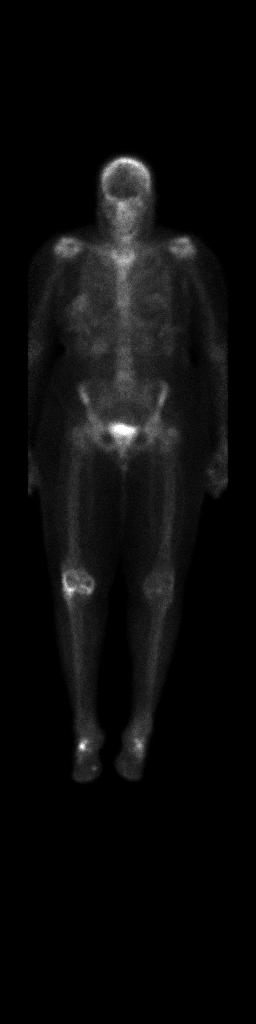

[2 of 2 positions shown; findings below may reference images not displayed]

FINDINGS: Uptake at the shoulders, knees especially RIGHT, and feet, typically
degenerative.

No abnormal osseous tracer accumulation is identified which is
suspicious for osseous metastatic disease.

Minimal nonspecific tracer localization within the breast greater on
RIGHT.

Urinary tract and soft tissue distribution of tracer is otherwise
unremarkable.
IMPRESSION: No scintigraphic evidence of osseous metastatic disease.

## 2019-01-30 ENCOUNTER — Ambulatory Visit (INDEPENDENT_AMBULATORY_CARE_PROVIDER_SITE_OTHER)
Admission: RE | Admit: 2019-01-30 | Discharge: 2019-01-30 | Disposition: A | Discharge status: Home / Self Care | Payer: Medicare HMO | Source: Ambulatory Visit | Attending: Pulmonary Disease | Admitting: Pulmonary Disease

## 2019-01-30 ENCOUNTER — Other Ambulatory Visit: Payer: Self-pay

## 2019-01-30 ENCOUNTER — Other Ambulatory Visit: Payer: Self-pay | Admitting: Obstetrics and Gynecology

## 2019-01-30 ENCOUNTER — Other Ambulatory Visit (HOSPITAL_COMMUNITY)
Admission: RE | Admit: 2019-01-30 | Discharge: 2019-01-30 | Disposition: A | Discharge status: Home / Self Care | Payer: Medicare HMO | Source: Ambulatory Visit | Attending: Obstetrics and Gynecology | Admitting: Obstetrics and Gynecology

## 2019-01-30 DIAGNOSIS — R05 Cough: Secondary | ICD-10-CM | POA: Diagnosis not present

## 2019-01-30 DIAGNOSIS — L309 Dermatitis, unspecified: Secondary | ICD-10-CM | POA: Diagnosis not present

## 2019-01-30 DIAGNOSIS — Z01411 Encounter for gynecological examination (general) (routine) with abnormal findings: Secondary | ICD-10-CM | POA: Diagnosis not present

## 2019-01-30 DIAGNOSIS — R053 Chronic cough: Secondary | ICD-10-CM

## 2019-01-30 DIAGNOSIS — Z124 Encounter for screening for malignant neoplasm of cervix: Secondary | ICD-10-CM | POA: Diagnosis present

## 2019-01-30 DIAGNOSIS — R69 Illness, unspecified: Secondary | ICD-10-CM | POA: Diagnosis not present

## 2019-01-30 DIAGNOSIS — C50919 Malignant neoplasm of unspecified site of unspecified female breast: Secondary | ICD-10-CM | POA: Diagnosis not present

## 2019-01-30 DIAGNOSIS — Z1151 Encounter for screening for human papillomavirus (HPV): Secondary | ICD-10-CM | POA: Insufficient documentation

## 2019-01-30 DIAGNOSIS — N9089 Other specified noninflammatory disorders of vulva and perineum: Secondary | ICD-10-CM | POA: Diagnosis not present

## 2019-01-30 DIAGNOSIS — L259 Unspecified contact dermatitis, unspecified cause: Secondary | ICD-10-CM | POA: Diagnosis not present

## 2019-02-01 ENCOUNTER — Telehealth: Payer: Self-pay | Admitting: Pulmonary Disease

## 2019-02-01 DIAGNOSIS — R053 Chronic cough: Secondary | ICD-10-CM

## 2019-02-01 DIAGNOSIS — R05 Cough: Secondary | ICD-10-CM

## 2019-02-01 LAB — CYTOLOGY - PAP
Comment: NEGATIVE
Diagnosis: NEGATIVE
High risk HPV: NEGATIVE

## 2019-02-01 NOTE — Telephone Encounter (Signed)
Called and spoke with pt who is requesting the results of her CT. Dr. Loanne Drilling, please advise on this for pt. Thanks!

## 2019-02-02 NOTE — Telephone Encounter (Signed)
Unable to sched PFT today will try again on Monday.

## 2019-02-02 NOTE — Telephone Encounter (Signed)
Returned patient call to discuss CT results. Possible mild ILD. Will clinically correlate with PFTs. Pending results will consider further work-up +/- steroids for her symptoms.  Please schedule patient for PFTs as soon as possible and follow-up with me to discuss results.

## 2019-02-02 NOTE — Telephone Encounter (Signed)
PFT's ordered for hospital  Will forward to [facility] to let them know and see if they can please coordinate appt with Dr Loanne Drilling for after PFT, thanks!

## 2019-02-05 ENCOUNTER — Telehealth: Payer: Self-pay | Admitting: Pulmonary Disease

## 2019-02-05 NOTE — Telephone Encounter (Signed)
COVID Test:  11/27 @ 12:30 PFT @ [facility]:  11/30 @ 10:00 ROV w/ Dr. Loanne Drilling:  11/30 @ 11:30  Provided pt appt info & mailed appt info to address on file for pt.  Nothing further needed at this time.

## 2019-02-05 NOTE — Telephone Encounter (Signed)
Is it okay to schedule in January and follow up with an APP or do you want this done at the hospital? Thanks -pr

## 2019-02-06 ENCOUNTER — Encounter: Payer: Self-pay | Admitting: Oncology

## 2019-02-06 DIAGNOSIS — Z853 Personal history of malignant neoplasm of breast: Secondary | ICD-10-CM | POA: Diagnosis not present

## 2019-02-06 NOTE — Telephone Encounter (Signed)
Patient is already scheduled for PFTs and follow-up with me.

## 2019-02-07 ENCOUNTER — Other Ambulatory Visit: Payer: Self-pay

## 2019-02-07 ENCOUNTER — Ambulatory Visit (INDEPENDENT_AMBULATORY_CARE_PROVIDER_SITE_OTHER): Payer: Medicare HMO | Admitting: Pulmonary Disease

## 2019-02-07 ENCOUNTER — Telehealth: Payer: Self-pay | Admitting: Pulmonary Disease

## 2019-02-07 DIAGNOSIS — J849 Interstitial pulmonary disease, unspecified: Secondary | ICD-10-CM | POA: Diagnosis not present

## 2019-02-07 NOTE — Telephone Encounter (Signed)
Spoke with [name]. She stated that she was checking on the status of the surgical clearance form that was faxed yesterday. Patient is scheduled for surgery tomorrow at 730am. She just informed their office that she is seeing Dr. Loanne Drilling. Because of this, they are wanting to know if Dr. Loanne Drilling will sign the clearance letter.   Angelica Chessman that I would see if I could locate the form. She verbalized understanding.   Form has been found and given to [place].

## 2019-02-07 NOTE — Telephone Encounter (Signed)
Pt needs Dr. Loanne Drilling Ander Purpura to call ASAP regarding surgery clearance - CB# [phone]

## 2019-02-07 NOTE — Telephone Encounter (Signed)
Spoke with patient.  Spoke With [employer] scheduled

## 2019-02-07 NOTE — Progress Notes (Signed)
Virtual Visit via Telephone Note  I connected with Emily Perez on 02/07/19 at  4:30 PM EST by telephone and verified that I am speaking with the correct person using two identifiers.  Location: Patient: Home Provider: [facility]   I discussed the limitations, risks, security and privacy concerns of performing an evaluation and management service by telephone and the availability of in person appointments. I also discussed with the patient that there may be a patient responsible charge related to this service. The patient expressed understanding and agreed to proceed.   History of Present Illness:  [name] is a 63 year old female who presents for virtual visit for pre-op evaluation. She is scheduled for left rotator cuff surgery and planned for general anesthesia. She is currently being worked up in Pulmonary clinic for chronic non-productive cough and found on CT chest with findings concerning mild interstitial lung disease. This may scarring however we are planning for pulmonary function tests to evaluate for functional changes. On our last visit, her oxygen saturations was >99% on RA. She is able to perform ADLs without limitation and perform them independently. Denies recent fevers, chills, chest pain, shortness of breath.    Observations/Objective: No apparent distress on phone. No audible wheezing. Intermittent dry cough that can be heard.  Assessment and Plan:  Peri-operative Assessment of Pulmonary Risk for Non-Thoracic Surgery:  For Emily Perez, risk of perioperative pulmonary complications is increased by:  Age greater than 26 years   Respiratory complications generally occur in 1% of ASA Class I patients, 5% of ASA Class II and 10% of ASA Class III-IV patients These complications rarely result in mortality and iclude postoperative pneumonia, atelectasis, pulmonary embolism, ARDS and increased time requiring postoperative mechanical  ventilation.  Overall, I recommend proceeding with the surgery if the risk for respiratory complications are outweighed by the potential benefits. This will need to be discussed between the patient and surgeon.  To reduce risks of respiraotry complications, I recommend: --Pre- and post-operative incentive spirometry performed frequently while awake --Avoiding use of pancuronium during anesthesia. --Pplateau goal <16mmHg intra-operatively  I have discussed the risk factors and recommendations above with the patient.  Follow Up Instructions: Follow-up as needed   I discussed the assessment and treatment plan with the patient. The patient was provided an opportunity to ask questions and all were answered. The patient agreed with the plan and demonstrated an understanding of the instructions.   The patient was advised to call back or seek an in-person evaluation if the symptoms worsen or if the condition fails to improve as anticipated.  I provided 21 minutes of non-face-to-face time during this encounter.   Shawan Tosh Rodman Pickle, MD

## 2019-02-16 ENCOUNTER — Other Ambulatory Visit (HOSPITAL_COMMUNITY)
Admission: RE | Admit: 2019-02-16 | Discharge: 2019-02-16 | Disposition: A | Discharge status: Home / Self Care | Payer: Medicare HMO | Source: Ambulatory Visit | Attending: Pulmonary Disease | Admitting: Pulmonary Disease

## 2019-02-16 DIAGNOSIS — Z20828 Contact with and (suspected) exposure to other viral communicable diseases: Secondary | ICD-10-CM | POA: Insufficient documentation

## 2019-02-16 DIAGNOSIS — Z01812 Encounter for preprocedural laboratory examination: Secondary | ICD-10-CM | POA: Insufficient documentation

## 2019-02-16 LAB — SARS CORONAVIRUS 2 (TAT 6-24 HRS): SARS Coronavirus 2: NEGATIVE

## 2019-02-19 ENCOUNTER — Other Ambulatory Visit: Payer: Self-pay

## 2019-02-19 ENCOUNTER — Encounter: Payer: Self-pay | Admitting: Pulmonary Disease

## 2019-02-19 ENCOUNTER — Ambulatory Visit (HOSPITAL_COMMUNITY)
Admission: RE | Admit: 2019-02-19 | Discharge: 2019-02-19 | Disposition: A | Discharge status: Home / Self Care | Payer: Medicare HMO | Source: Ambulatory Visit | Attending: Pulmonary Disease | Admitting: Pulmonary Disease

## 2019-02-19 ENCOUNTER — Ambulatory Visit (INDEPENDENT_AMBULATORY_CARE_PROVIDER_SITE_OTHER): Payer: Medicare HMO | Admitting: Pulmonary Disease

## 2019-02-19 VITALS — BP 138/82 | HR 82 | Temp 97.4°F | Ht 63.5 in | Wt 166.4 lb

## 2019-02-19 DIAGNOSIS — R053 Chronic cough: Secondary | ICD-10-CM

## 2019-02-19 DIAGNOSIS — J849 Interstitial pulmonary disease, unspecified: Secondary | ICD-10-CM

## 2019-02-19 DIAGNOSIS — R05 Cough: Secondary | ICD-10-CM | POA: Diagnosis not present

## 2019-02-19 LAB — PULMONARY FUNCTION TEST
DL/VA % pred: 92 %
DL/VA: 3.89 ml/min/mmHg/L
DLCO unc % pred: 57 %
DLCO unc: 11.12 ml/min/mmHg
FEF 25-75 Post: 1.78 L/sec
FEF 25-75 Pre: 1.41 L/sec
FEF2575-%Change-Post: 26 %
FEF2575-%Pred-Post: 96 %
FEF2575-%Pred-Pre: 76 %
FEV1-%Change-Post: 0 %
FEV1-%Pred-Post: 84 %
FEV1-%Pred-Pre: 83 %
FEV1-Post: 1.61 L
FEV1-Pre: 1.61 L
FEV1FVC-%Change-Post: 8 %
FEV1FVC-%Pred-Pre: 103 %
FEV6-%Change-Post: -2 %
FEV6-%Pred-Post: 77 %
FEV6-%Pred-Pre: 80 %
FEV6-Post: 1.85 L
FEV6-Pre: 1.9 L
FEV6FVC-%Change-Post: 4 %
FEV6FVC-%Pred-Post: 103 %
FEV6FVC-%Pred-Pre: 99 %
FVC-%Change-Post: -7 %
FVC-%Pred-Post: 74 %
FVC-%Pred-Pre: 80 %
FVC-Post: 1.85 L
FVC-Pre: 1.99 L
Post FEV1/FVC ratio: 87 %
Post FEV6/FVC ratio: 100 %
Pre FEV1/FVC ratio: 81 %
Pre FEV6/FVC Ratio: 95 %

## 2019-02-19 MED ORDER — MOMETASONE FURO-FORMOTEROL FUM 100-5 MCG/ACT IN AERO: 2.0000 | INHALATION_SPRAY | Freq: Two times a day (BID) | RESPIRATORY_TRACT | 0 refills | Status: DC

## 2019-02-19 MED ORDER — ALBUTEROL SULFATE (2.5 MG/3ML) 0.083% IN NEBU
2.5000 mg | INHALATION_SOLUTION | Freq: Once | RESPIRATORY_TRACT | Status: AC
  Administered 2019-02-19: 2.5 mg via RESPIRATORY_TRACT

## 2019-02-19 NOTE — Patient Instructions (Addendum)
Interstitial lung disease (ILD) Based on your CT Chest and pulmonary function tests, you have a diagnosis of interstitial lung disease. At this time, the etiology of your ILD is unknown. There may be a genetic or autoimmune component since you have siblings with a similar condition. We will obtain labs to rule out autoimmune issues. For now, we will only monitor for symptoms and repeat CT Chest in 6 months.  --Patient given ILD questionnaire to fill out --Please obtain labs for evaluation --Trial one month of Dulera TWO PUFFS TWICE A DAY --We will tentatively schedule CT Chest without contrast in 6 months

## 2019-02-19 NOTE — Progress Notes (Signed)
Subjective:   PATIENT ID: Emily Perez GENDER: female DOB: 1955/05/04, MRN: [id number]   HPI  Chief Complaint  Patient presents with  . Follow-up    PFT, cough still the same    Reason for Visit: F/u  Ms. Adalaya Byrnes is a 63 year old female with history of right breast cancer s/p lumpectomy/chemotherapy/radiation, hypertension and hyperlipidemia who presents for PFT follow-up.  Since our last visit, she reports that her cough is unchanged in character. Cough is nonproductive and hacky. Still triggered by brushing her teeth or when taking deep breaths such as during agitation or laughing. Denies any associated shortness of breath or wheezing.  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 Another sister with ILD (unknown etiology with bronchoscopy and surgical biopsy, hx fibromyalgia)  Environmental exposures: Sat during construction around 15 years ago, for 15 years, chemotherapy/radiation  I have personally reviewed patient's past medical/family/social history/allergies/current medications.  Past Medical History:  Diagnosis Date  . Allergic rhinitis   . Arthritis   . Breast cancer ([place])   . Family history of breast cancer   . Family history of prostate cancer   . Headache    migraines in the past  . Hyperlipidemia   . Hypertension   . PONV (postoperative nausea and vomiting)    after 1st colonoscopy     Family History  Problem Relation Age of Onset  . Heart disease Mother   . Cancer Father   . Diabetes Father   . Hypertension Father   . Hyperlipidemia Father   . Emphysema Father   . Heart disease Brother   . Hypertension Brother   . Hyperlipidemia Brother   . Diabetes Brother   . Prostate cancer Brother 52  . Breast cancer Sister 13  . Breast cancer Sister 67  . Asthma Sister   . Cancer Paternal Aunt        unsure what type of cancer she had  . Prostate cancer Paternal Uncle   . Diabetes Brother   . Gout Brother    . Kidney disease Brother   . Lung disease Sister   . Breast cancer Sister      Social History   Occupational History  . Not on file  Tobacco Use  . Smoking status: Never Smoker  . Smokeless tobacco: Never Used  Substance and Sexual Activity  . Alcohol use: No  . Drug use: No  . Sexual activity: Not on file    Allergies  Allergen Reactions  . Latex Hives and Rash     Outpatient Medications Prior to Visit  Medication Sig Dispense Refill  . anastrozole (ARIMIDEX) 1 MG tablet Take 1 tablet (1 mg total) by mouth daily. 90 tablet 4  . aspirin 81 MG tablet Take 81 mg by mouth 3 (three) times a week.     . famotidine (PEPCID) 20 MG tablet Take 1 tablet (20 mg total) by mouth 2 (two) times daily. 60 tablet 3  . meclizine (ANTIVERT) 12.5 MG tablet     . Multiple Vitamins-Minerals (MULTIVITAMIN WITH MINERALS) tablet Take 1 tablet by mouth daily.    . naproxen sodium (ANAPROX) 550 MG tablet Take 550 mg by mouth daily as needed (pain). Takes 0.5-1 tablet as needed for pain     . omeprazole (PRILOSEC) 20 MG capsule Take 1 capsule (20 mg total) by mouth 2 (two) times daily before a meal. 60 capsule 1  . ondansetron (ZOFRAN-ODT) 8 MG disintegrating tablet     .  OVER THE COUNTER MEDICATION Apply 1 application topically at bedtime as needed. Pain Relief Balm     . scopolamine (TRANSDERM-SCOP) 1 MG/3DAYS UNW AND APP 1 PA TO SKIN PRN  5  . Triamcinolone Acetonide 0.025 % LOTN Apply 1 application topically 3 (three) times daily. 60 mL 1  . triamterene-hydrochlorothiazide (DYAZIDE) 37.5-25 MG capsule Take 1 capsule by mouth daily.  3  . vitamin B-12 (CYANOCOBALAMIN) 1000 MCG tablet Take 500 mcg by mouth daily as needed. Takes 0.5 tablet     . zaleplon (SONATA) 5 MG capsule TK 1 C PO QD HS PRN  5   No facility-administered medications prior to visit.     Review of Systems  Constitutional: Negative for chills, diaphoresis, fever, malaise/fatigue and weight loss.  HENT: Negative for  congestion.   Respiratory: Positive for cough. Negative for hemoptysis, sputum production, shortness of breath and wheezing.   Cardiovascular: Negative for chest pain, palpitations and leg swelling.     Objective:   Vitals:   02/19/19 1154  BP: 138/82  Pulse: 82  SpO2: 96%   SpO2: 96 % O2 Device: None (Room air)  Physical Exam: General: Well-appearing, no acute distress HENT: [place], AT Eyes: EOMI, no scleral icterus Respiratory: Clear to auscultation bilaterally.  No crackles, wheezing or rales Cardiovascular: RRR, -M/R/G, no JVD GI: BS+, soft, nontender Extremities:-Edema,-tenderness Neuro: AAO x4, CNII-XII grossly intact Skin: Intact, no rashes or bruising Psych: Normal mood, normal affect  Data Reviewed:  Imaging: CT chest 05/18/2017-normal parenchyma with no evidence of interstitial thickening/markings, no evidence of infiltrate, effusion or edema.  CT Chest 01/30/2019-new bandlike fibrosis in the right upper lung, mild patchy subpleural reticulation and ground glass opacities  PFT: 02/19/19 FVC 1.85 (74%) FEV1 1.61 (84%) Ratio 81  SVC 79% DLCO 57% Interpretation: Mild interstitial lung disease with moderately reduced DLCO  TTE: 05/17/2017 EF 55 to 60%.  Grade 1 diastolic dysfunction present.  No WMA or valvular abnormalities  Labs:  Imaging, labs and test noted above have been reviewed independently by me.    Assessment & Plan:   Discussion: 63 year old female with history of right breast cancer s/p lumpectomy/chemotherapy/radiation, hypertension and hyperlipidemia who presents for follow-up. Reviewed PFTs and CT Chest with patient and addressed her questions in detail. Cough is likely related to ILD. During our visit, patient also contacted her sister to address questions regarding her ILD diagnosis which remains unknown in etiology. She has a second sister with asthma and also undergoing ILD work-up at [facility].  Interstitial lung disease (ILD) Based on your CT  Chest and pulmonary function tests, you have a diagnosis of interstitial lung disease. At this time, the etiology of your ILD is unknown. There may be a genetic or autoimmune component since you have siblings with a similar condition. We will obtain labs to rule out autoimmune issues. For now, we will only monitor for symptoms and repeat CT Chest in 6 months.  --Patient given ILD questionnaire to fill out --Please obtain labs for evaluation --Trial one month of Dulera TWO PUFFS TWICE A DAY --We will tentatively schedule CT Chest without contrast in 6 months  Follow-up telephone visit in 3 weeks.  Health Maintenance - Due for pneumococcal and influenza Immunization History  Administered Date(s) Administered  . Zoster Recombinat (Shingrix) 01/10/2018, 01/30/2019   CT Lung Screen - Not qualified  Orders Placed This Encounter  Procedures  . CT Chest Wo Contrast    Please schedule in 6 months    Standing Status:  Future    Standing Expiration Date:   04/20/2020    Order Specific Question:   Preferred imaging location?    Answer:   [facility]    Order Specific Question:   Radiology Contrast Protocol - do NOT remove file path    Answer:   [email or web address]  . Antinuclear Antib (ANA)    Standing Status:   Future    Number of Occurrences:   1    Standing Expiration Date:   02/19/2020  . Cyclic citrul peptide antibody, IgG    Standing Status:   Future    Number of Occurrences:   1    Standing Expiration Date:   02/19/2020  . HIV antibody (with reflex)    Standing Status:   Future    Number of Occurrences:   1    Standing Expiration Date:   02/19/2020  . Myositis Assessr Plus Jo-1 Autoabs    Standing Status:   Future    Number of Occurrences:   1    Standing Expiration Date:   02/19/2020  . Rheumatoid Factor    Standing Status:   Future    Number of Occurrences:   1    Standing Expiration Date:   02/19/2020   Meds ordered this encounter   Medications  . mometasone-formoterol (DULERA) 100-5 MCG/ACT AERO    Sig: Inhale 2 puffs into the lungs 2 (two) times daily.    Dispense:  8.8 g    Refill:  0   Return in about 3 weeks (around 03/12/2019).  Greater than 50% of this patient 45-minute office visit was spent face-to-face in counseling with the patient/family. We discussed medical diagnosis and treatment plan as noted.  [place], MD [facility] 02/19/2019 11:57 AM  Office Number [phone]

## 2019-02-20 ENCOUNTER — Telehealth: Payer: Self-pay | Admitting: Pulmonary Disease

## 2019-02-20 ENCOUNTER — Other Ambulatory Visit: Payer: Medicare HMO

## 2019-02-20 DIAGNOSIS — J849 Interstitial pulmonary disease, unspecified: Secondary | ICD-10-CM

## 2019-02-20 NOTE — Telephone Encounter (Signed)
Forwarding to Dr. Cordelia Pen nurse for follow up

## 2019-02-21 NOTE — Progress Notes (Signed)
[place]  Telephone:([phone]) [phone] [phone]     ID: Emily Perez DOB: [birth year]  MR#: [id number]  QDI#:[id number]  Patient Care Team: Shirline Frees, MD as PCP - General (Family Medicine) Elnor Renovato, Virgie Dad, MD as Consulting Physician (Oncology) Rolm Bookbinder, MD as Consulting Physician (General Surgery) Gery Pray, MD as Consulting Physician (Radiation Oncology) Allyn Kenner, MD (Dermatology) Thurnell Lose, MD as Consulting Physician (Obstetrics and Gynecology) Marchia Bond, MD as Consulting Physician (Orthopedic Surgery) Christene Slates, MD as Physician Assistant (Radiology) OTHER MD:   CHIEF COMPLAINT: Triple negative breast cancer; PALB2 mutation  CURRENT TREATMENT: Considering anastrozole   INTERVAL HISTORY: Allicia returns today for follow up of her triple negative breast cancer. She continues under observation.  She tells me she took anastrozole 1 time and it knocked her out so she simply stopped.  In fact she threw away the medication.  Since her last visit, she presented to her cardiologist with a several month history of chronic nonproductive cough. She underwent high resolution chest CT on 01/30/2019, which showed: new radiation fibrosis; chronic mild patchy subpleural reticulation and ground-glass attenuation throughout both lungs, unchanged since 04/2017 chest CT; no thoracic adenopathy or other findings of metastatic disease.  She saw Dr. Loanne Drilling who suggested steroids and a bronchial biopsy but Zayneb is not sure she wants to go that route and is considering a second opinion.  She also saw Dr. Simona Huh on 02/09/2019. Pap smear was performed at that time, which was negative for malignancy and for HPV.  She was also noted to have a right labial lesion, which was biopsied in the office. Pathology ([id number]) showed: lichenoid interface dermatitis, irritated; negative for fungal hyphae; differential diagnosis includes a drug eruption  or other hypersensitivity reaction.  Since her last visit, she underwent bilateral diagnostic mammography with tomography at [facility] on 02/06/2019 showing: breast density category B; stable posttreatment changes on the right, no evidence of malignancy in either breast.   REVIEW OF SYSTEMS: Donnah just had left rotator cuff surgery under Dr. Mardelle Matte.  She feels it is going well.  She is going to start rehabbing in January.  Her energy is a little bit down.  She has moderate insomnia.  Her appetite goes up and down.  Right now she is not exercising regularly.  A detailed review of systems was otherwise stable.   HISTORY OF CURRENT ILLNESS: From the original intake note:  YASMEEN MANKA had routine bilateral screening mammography at [facility] on 04/19/2017 showing a possible abnormality in the right breast. She underwent unilateral right diagnostic mammography with tomography and right breast ultrasonography at [facility] on 04/25/2017 showing: breast density category B.  In the right breast at the 6:00 radiant 2 cm from the nipple there was a 2 cm oval mass which by ultrasound measured 2.5 cm and was irregular and hypoechoic.  There were also at least 3 abnormal appearing lymph nodes in the right axilla.  Accordingly on 05/02/2017 she proceeded to biopsy of the right breast mass in question and lymph node sampling. The pathology from this procedure showed ([id number]): Invasive ductal carcinoma grade II. One lymph node positive for metastatic carcinoma (1/1). Prognostic indicators significant for: estrogen receptor, 0% negative and progesterone receptor, 0% negative. Proliferation marker Ki67 at 80%. HER2 not amplified with ratios HER2/CEP17 signals 1.29 and average copies per cell 2.25  The patient's subsequent history is as detailed below.   PAST MEDICAL HISTORY: Past Medical History:  Diagnosis Date   Allergic rhinitis  Arthritis    Breast cancer ([place])    Family history of breast cancer     Family history of prostate cancer    Headache    migraines in the past   Hyperlipidemia    Hypertension    PONV (postoperative nausea and vomiting)    after 1st colonoscopy  Migraines in the past.    PAST SURGICAL HISTORY: Past Surgical History:  Procedure Laterality Date   BREAST LUMPECTOMY WITH RADIOACTIVE SEED AND SENTINEL LYMPH NODE BIOPSY Right 08/30/2017   Procedure: RIGHT BREAST RADIOACTIVE SEED GUIDED LUMPECTOMY WITH RIGHT RADIOACTIVE SEED TARGETED AXILLARY LYMPH NODE EXCISION AND RIGHT SENTINEL LYMPH NODE BIOPSY;  Surgeon: Rolm Bookbinder, MD;  Location: [place];  Service: General;  Laterality: Right;   COLONOSCOPY     FOOT SURGERY Bilateral    FRACTURE SURGERY Left    wrist   PORTACATH PLACEMENT Right 05/19/2017   Procedure: INSERTION PORT-A-CATH WITH ULTRASOUND;  Surgeon: Rolm Bookbinder, MD;  Location: [place];  Service: General;  Laterality: Right;   TUBAL LIGATION     WISDOM TOOTH EXTRACTION     Wrist Fracture. Torn Rotator cuff and torn meniscus.    FAMILY HISTORY Family History  Problem Relation Age of Onset   Heart disease Mother    Cancer Father    Diabetes Father    Hypertension Father    Hyperlipidemia Father    Emphysema Father    Heart disease Brother    Hypertension Brother    Hyperlipidemia Brother    Diabetes Brother    Prostate cancer Brother 20   Breast cancer Sister 19   Breast cancer Sister 74   Asthma Sister    Cancer Paternal Aunt        unsure what type of cancer she had   Prostate cancer Paternal Uncle    Diabetes Brother    Gout Brother    Kidney disease Brother    Lung disease Sister    Breast cancer Sister   The patient's father died at age 32 due to heart disease and emphysema. The patient's mother died at age 59 due to heart disease. The patient has 6 brothers and 8 sisters. She notes 2 sisters with breast cancer. The 1st sister was diagnosed at age 80, and the 2nd sister was diagnosed at age 59.  She notes that both sisters are alive. She also notes a paternal first cousin with breast cancer diagnosed in her 41's. The patient notes that her father also had bone cancer, but she's unsure if he had myeloma. She denies a family history of ovarian cancer.    GYNECOLOGIC HISTORY:  No LMP recorded. Patient is postmenopausal. 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 She is divorced. She lives by herself with no pets. The patient's daughter, [name], works in Therapist, art for [employer]. The patient's son, [name],  works for [employer]. The patient has  2 grandchildren. She belongs to [facility].     ADVANCED DIRECTIVES: Not in place.  At the 05/19/2017 visit the patient was given the appropriate documents to complete and notarized at her discretion   HEALTH MAINTENANCE: Social History   Tobacco Use   Smoking status: Never Smoker   Smokeless tobacco: Never Used  Substance Use Topics   Alcohol use: No   Drug  use: No     Colonoscopy:   PAP: September 2018 normal  Bone density: none   Allergies  Allergen Reactions   Latex Hives and Rash    Current Outpatient Medications  Medication Sig Dispense Refill   anastrozole (ARIMIDEX) 1 MG tablet Take 1 tablet (1 mg total) by mouth daily. 90 tablet 4   aspirin 81 MG tablet Take 81 mg by mouth 3 (three) times a week.      famotidine (PEPCID) 20 MG tablet Take 1 tablet (20 mg total) by mouth 2 (two) times daily. 60 tablet 3   meclizine (ANTIVERT) 12.5 MG tablet      mometasone-formoterol (DULERA) 100-5 MCG/ACT AERO Inhale 2 puffs into the lungs 2 (two) times daily. 8.8 g 0   Multiple Vitamins-Minerals (MULTIVITAMIN WITH MINERALS) tablet Take 1 tablet by mouth daily.     naproxen sodium (ANAPROX) 550 MG tablet Take 550 mg by  mouth daily as needed (pain). Takes 0.5-1 tablet as needed for pain      omeprazole (PRILOSEC) 20 MG capsule Take 1 capsule (20 mg total) by mouth 2 (two) times daily before a meal. 60 capsule 1   ondansetron (ZOFRAN-ODT) 8 MG disintegrating tablet      OVER THE COUNTER MEDICATION Apply 1 application topically at bedtime as needed. Pain Relief Balm      scopolamine (TRANSDERM-SCOP) 1 MG/3DAYS UNW AND APP 1 PA TO SKIN PRN  5   Triamcinolone Acetonide 0.025 % LOTN Apply 1 application topically 3 (three) times daily. 60 mL 1   triamterene-hydrochlorothiazide (DYAZIDE) 37.5-25 MG capsule Take 1 capsule by mouth daily.  3   vitamin B-12 (CYANOCOBALAMIN) 1000 MCG tablet Take 500 mcg by mouth daily as needed. Takes 0.5 tablet      zaleplon (SONATA) 5 MG capsule TK 1 C PO QD HS PRN  5   No current facility-administered medications for this visit.     OBJECTIVE: Middle-aged African-American woman wearing a left arm sling  Vitals:   02/22/19 1301  BP: 128/82  Pulse: 78  Resp: 18  Temp: 97.8 F (36.6 C)  SpO2: 100%     Body mass index is 28.98 kg/m.   Wt Readings from Last 3 Encounters:  02/22/19 166 lb 3.2 oz (75.4 kg)  02/19/19 166 lb 6.4 oz (75.5 kg)  12/28/18 159 lb 12.8 oz (72.5 kg)   ECOG FS:1 - Symptomatic but completely ambulatory   Sclerae unicteric, EOMs intact Wearing a mask No cervical or supraclavicular adenopathy Lungs no rales or rhonchi Heart regular rate and rhythm Abd soft, nontender, positive bowel sounds MSK no focal spinal tenderness, left arm in a sling; no obvious swelling Neuro: nonfocal, well oriented, appropriate affect Breasts: Exam through the shirt because of the sling and the patient not wanting to undress.  No palpable abnormalities were noted   LAB RESULTS:  CMP     Component Value Date/Time   NA 140 09/28/2018 1358   K 4.1 09/28/2018 1358   CL 105 09/28/2018 1358   CO2 26 09/28/2018 1358   GLUCOSE 94 09/28/2018 1358   BUN 23  09/28/2018 1358   CREATININE 1.11 (H) 09/28/2018 1358   CREATININE 1.38 (H) 08/03/2017 1114   CALCIUM 8.8 (L) 09/28/2018 1358   PROT 7.4 09/28/2018 1358   ALBUMIN 3.8 09/28/2018 1358   AST 21 09/28/2018 1358   AST 30 08/03/2017 1114   ALT 14 09/28/2018 1358   ALT 31 08/03/2017 1114   ALKPHOS 91 09/28/2018 1358  BILITOT 0.4 09/28/2018 1358   BILITOT 0.5 08/03/2017 1114   GFRNONAA 52 (L) 09/28/2018 1358   GFRNONAA 40 (L) 08/03/2017 1114   GFRAA >60 09/28/2018 1358   GFRAA 46 (L) 08/03/2017 1114    No results found for: TOTALPROTELP, ALBUMINELP, A1GS, A2GS, BETS, BETA2SER, GAMS, MSPIKE, SPEI  No results found for: KPAFRELGTCHN, LAMBDASER, [facility]  Lab Results  Component Value Date   WBC 7.6 02/22/2019   NEUTROABS 5.5 02/22/2019   HGB 9.9 (L) 02/22/2019   HCT 30.9 (L) 02/22/2019   MCV 79.2 (L) 02/22/2019   PLT 255 02/22/2019    @LASTCHEMISTRY @  No results found for: LABCA2  No components found for: [id number]  No results for input(s): INR in the last 168 hours.  No results found for: LABCA2  No results found for: JJH417  No results found for: EYC144  No results found for: YJE563  No results found for: CA2729  No components found for: HGQUANT  No results found for: CEA1 / No results found for: CEA1   No results found for: AFPTUMOR  No results found for: CHROMOGRNA  No results found for: PSA1  Appointment on 02/22/2019  Component Date Value Ref Range Status   WBC 02/22/2019 7.6  4.0 - 10.5 K/uL Final   RBC 02/22/2019 3.90  3.87 - 5.11 MIL/uL Final   Hemoglobin 02/22/2019 9.9* 12.0 - 15.0 g/dL Final   HCT 02/22/2019 30.9* 36.0 - 46.0 % Final   MCV 02/22/2019 79.2* 80.0 - 100.0 fL Final   MCH 02/22/2019 25.4* 26.0 - 34.0 pg Final   MCHC 02/22/2019 32.0  30.0 - 36.0 g/dL Final   RDW 02/22/2019 15.0  11.5 - 15.5 % Final   Platelets 02/22/2019 255  150 - 400 K/uL Final   nRBC 02/22/2019 0.0  0.0 - 0.2 % Final   Neutrophils Relative %  02/22/2019 72  % Final   Neutro Abs 02/22/2019 5.5  1.7 - 7.7 K/uL Final   Lymphocytes Relative 02/22/2019 15  % Final   Lymphs Abs 02/22/2019 1.1  0.7 - 4.0 K/uL Final   Monocytes Relative 02/22/2019 10  % Final   Monocytes Absolute 02/22/2019 0.7  0.1 - 1.0 K/uL Final   Eosinophils Relative 02/22/2019 3  % Final   Eosinophils Absolute 02/22/2019 0.2  0.0 - 0.5 K/uL Final   Basophils Relative 02/22/2019 0  % Final   Basophils Absolute 02/22/2019 0.0  0.0 - 0.1 K/uL Final   Immature Granulocytes 02/22/2019 0  % Final   Abs Immature Granulocytes 02/22/2019 0.02  0.00 - 0.07 K/uL Final   Performed at [facility], [place] [address], [place], [place] [address]  Appointment on 02/20/2019  Component Date Value Ref Range Status   Rhuematoid fact SerPl-aCnc 02/20/2019 <14  <14 IU/mL Final   HIV 1&2 Ab, 4th Generation 02/20/2019 NON-REACTIVE  NON-REACTI Final   Comment: HIV-1 antigen and HIV-1/HIV-2 antibodies were not detected. There is no laboratory evidence of HIV infection. Marland Kitchen PLEASE NOTE: This information has been disclosed to you from records whose confidentiality may be protected by state law.  If your state requires such protection, then the state law prohibits you from making any further disclosure of the information without the specific written consent of the person to whom it pertains, or as otherwise permitted by law. A general authorization for the release of medical or other information is NOT sufficient for this purpose. . For additional information please refer to http://education.questdiagnostics.com/faq/FAQ106 (This link is being provided for  informational/ educational purposes only.) . Marland Kitchen The performance of this assay has not been clinically validated in patients less than 33 years old. .    Cyclic Citrullin Peptide Ab 02/20/2019 <16  UNITS Final   Comment: Reference Range Negative:            <20 Weak Positive:        20-39 Moderate Positive:   40-59 Strong Positive:     >59 .    Anti Nuclear Antibody (ANA) 02/20/2019 NEGATIVE  NEGATIVE Final   Comment: ANA IFA is a first line screen for detecting the presence of up to approximately 150 autoantibodies in various autoimmune diseases. A negative ANA IFA result suggests an ANA-associated autoimmune disease is not present at this time, but is not definitive. If there is high clinical suspicion for Sjogren's syndrome, testing for anti-SS-A/Ro antibody should be considered. Anti-Jo-1 antibody should be considered for clinically suspected inflammatory myopathies. . AC-0: Negative . International Consensus on ANA Patterns (https://www.hernandez-brewer.com/) . For additional information, please refer to http://education.[facility].com/faq/FAQ177 (This link is being provided for informational/ educational purposes only.) .     (this displays the last labs from the last 3 days)  No results found for: TOTALPROTELP, ALBUMINELP, A1GS, A2GS, BETS, BETA2SER, GAMS, MSPIKE, SPEI (this displays SPEP labs)  No results found for: KPAFRELGTCHN, LAMBDASER, KAPLAMBRATIO (kappa/lambda light chains)  No results found for: HGBA, HGBA2QUANT, HGBFQUANT, HGBSQUAN (Hemoglobinopathy evaluation)   No results found for: LDH  Lab Results  Component Value Date   IRON 170 (H) 07/12/2017   TIBC 303 07/12/2017   IRONPCTSAT 56 07/12/2017   (Iron and TIBC)  Lab Results  Component Value Date   FERRITIN 685 (H) 07/12/2017    Urinalysis No results found for: COLORURINE, APPEARANCEUR, LABSPEC, PHURINE, GLUCOSEU, HGBUR, BILIRUBINUR, KETONESUR, PROTEINUR, UROBILINOGEN, NITRITE, LEUKOCYTESUR   STUDIES: Ct Chest High Resolution  Result Date: 01/30/2019 CLINICAL DATA:  Chronic cough, nonproductive, for several months. Right breast cancer in 2019 with radiation therapy completed 11/28/2017. EXAM: CT CHEST WITHOUT CONTRAST TECHNIQUE: Multidetector CT imaging  of the chest was performed following the standard protocol without intravenous contrast. High resolution imaging of the lungs, as well as inspiratory and expiratory imaging, was performed. COMPARISON:  05/18/2017 chest CT. FINDINGS: Cardiovascular: Normal heart size. No significant pericardial effusion/thickening. Left anterior descending coronary atherosclerosis. Atherosclerotic nonaneurysmal thoracic aorta. Normal caliber pulmonary arteries. Mediastinum/Nodes: No discrete thyroid nodules. Unremarkable esophagus. Surgical clips and fat stranding noted in the right axilla. No axillary adenopathy. No pathologically enlarged mediastinal or discrete hilar nodes on this noncontrast scan. Lungs/Pleura: No pneumothorax. No pleural effusion. No acute consolidative airspace disease, lung masses or significant pulmonary nodules. There is mildly thick and irregular bandlike consolidation throughout the subpleural peripheral mid to upper right lung with associated traction bronchiolectasis and architectural distortion, new since 05/18/2017 chest CT. There is mild patchy subpleural reticulation and ground-glass attenuation throughout the remaining lungs without significant bronchiectasis or architectural distortion in the remaining lungs, not substantially changed from prior chest CT. No frank honeycombing. No significant air trapping or evidence of tracheobronchomalacia on the expiration sequence. Upper abdomen: Mild fullness of the visualized upper left renal collecting system, not appreciably changed from prior chest CT. Musculoskeletal: No aggressive appearing focal osseous lesions. Mild thoracic spondylosis. IMPRESSION: 1. New bandlike radiation fibrosis in the peripheral mid to upper right lung. 2. Chronic mild patchy subpleural reticulation and ground-glass attenuation throughout both lungs without bronchiectasis or honeycombing, unchanged since 05/18/2017 chest CT. Findings may represent a mild nonprogressive underlying  interstitial  lung disease such as nonspecific interstitial pneumonia (NSIP). Usual interstitial pneumonia (UIP) is considered less likely but not entirely excluded. Findings are indeterminate for UIP per consensus guidelines: Diagnosis of Idiopathic Pulmonary Fibrosis: An Official ATS/ERS/JRS/ALAT Clinical Practice Guideline. [place], Iss 5, [phone], Nov 20 2016. 3. One vessel coronary atherosclerosis. 4. No thoracic adenopathy or other findings of metastatic disease in the chest. Aortic Atherosclerosis (ICD10-I70.0). Electronically Signed   By: Ilona Sorrel M.D.   On: 01/30/2019 15:13    ELIGIBLE FOR AVAILABLE RESEARCH PROTOCOL: no  ASSESSMENT: 63 y.o. [place] Woman status post right breast upper outer quadrant biopsy 05/02/2017 for a clinical T2 N1-2, stage IIIB invasive ductal carcinoma, grade 2-3, triple negative, with an MIB-1 80%  (a) breast MRI 05/18/2017 shows T3 N1 disease with possible involvement of the internal mammary nodes  (1) neoadjuvant chemotherapy consisting of doxorubicin and cyclophosphamide in dose dense fashion x4 starting 05/24/2017, completed 07/05/2017  (a) planned carboplatin/paclitaxel x12 omitted secondary to peripheral neuropathy concerns  (2) status post right lumpectomy 08/30/2017 showing a complete pathologic response (ypT0 ypN0)  (a) a total of 5 lymph nodes were removed  (3) adjuvant radiation :10/12/2017-11/28/2017 Site/dose:   1. Right breast, Ax_SCV, 1.8 Gy in 25 fractions for a total dose of 45 Gy.                       2. Right breast, 1.8 Gy in 28 fractions for a total dose of 50.4 Gy                      3. Boost, 2 Gy in 5 fractions for a total dose of 10 Gy  (4) Genetics testing offered through [facility] Multi-cancer Panel on 08/03/2017 showed a pathogenic variant in PALB2 ([id number] (p.Gln60Argfs*7)  (a) there was a VUS identified in CDH1  (b) no additional deleterious mutations were noted in ALK, APC, ATM, AXIN2, BAP1,  BARD1, BLM, BMPR1A, BRCA1, BRCA2, BRIP1, CASR, CDC73, CDH1, CDK4, CDKN1B, CDKN1C, CDKN2A (p14ARF), CDKN2A (p16INK4a), CEBPA, CHEK2, CTNNA1, DICER1, DIS3L2, EPCAM*, FH, FLCN, GATA2, GPC3, GREM1*, HRAS, KIT, MAX, MEN1, MET, MLH1, MSH2, MSH3, MSH6, MUTYH, NBN, NF1, NF2, PALB2, PDGFRA, PHOX2B*, PMS2, POLD1, POLE, POT1, PRKAR1A, PTCH1, PTEN, RAD50, RAD51C, RAD51D, RB1, RECQL4, RET, RUNX1, SDHAF2, SDHB, SDHC, SDHD, SMAD4, SMARCA4, SMARCB1, SMARCE1, STK11, SUFU, TERC, TERT, TMEM127, TP53, TSC1, TSC2, VHL, WRN*, WT1. The following genes were evaluated for sequence changes only: EGFR*, HOXB13*, MITF*, NTHL1*, SDHA Results are negative unless otherwise indicated  (c) per NCCN guidelines, there is insufficient data to associate PALB2 mutations with increased ovarian cancer risk; this is managed according to family history  (5) PALB2: intensified screening:  (a) mammography with tomography every November  (b) breast MRI every May  (c) family is aware and those who wish to be tested have been tested  (6) PALB2: breast cancer prophylaxis: Patient opted against antiestrogens  (7) thalassemia: With MCV of 76.5 and ferritin 685 on April 2019   PLAN: Cash is now a year and a half out from definitive surgery for her breast cancer with no evidence of disease recurrence.  This is favorable.  She tells me she took anastrozole 1 time and threw away the rest of the pills because of side effects.  Since then she has a friend who takes anastrozole at bedtime and tolerates it fine.  Olivette says she would like to give it a try at bedtime and I wrote her a  prescription--she requested a paper prescription so she could shop it around.  She is considering a second opinion at [facility] and we discussed that at length.  Her sister with interstitial lung disease is followed at [facility] and I suggested if she is going to go there she should try to see the same physician so that he can consider whether there may be an inherited issue  involved, although apparently the sister with the interstitial disease is not the sister with the PALB2 mutation  From a breast cancer point of view Kadra is sufficiently stable that I can start seeing her on a once a year basis.  I will see her right after her mammogram next year, and that will be early December.  She knows to call for any other issue that may develop before the next visit.  Jayson Waterhouse, Virgie Dad, MD  02/22/19 1:14 PM Medical Oncology and Hematology [facility] [place], [place] [address] Tel. [phone]    Fax. [phone]   I, Wilburn Mylar, am acting as scribe for Dr. Virgie Dad. Zadia Uhde.  I, Lurline Del MD, have reviewed the above documentation for accuracy and completeness, and I agree with the above.

## 2019-02-22 ENCOUNTER — Inpatient Hospital Stay (HOSPITAL_BASED_OUTPATIENT_CLINIC_OR_DEPARTMENT_OTHER): Payer: Medicare HMO | Admitting: Oncology

## 2019-02-22 ENCOUNTER — Inpatient Hospital Stay: Payer: Medicare HMO | Attending: Oncology

## 2019-02-22 ENCOUNTER — Other Ambulatory Visit: Payer: Self-pay

## 2019-02-22 VITALS — BP 128/82 | HR 78 | Temp 97.8°F | Resp 18 | Ht 63.5 in | Wt 166.2 lb

## 2019-02-22 DIAGNOSIS — D569 Thalassemia, unspecified: Secondary | ICD-10-CM | POA: Diagnosis not present

## 2019-02-22 DIAGNOSIS — Z841 Family history of disorders of kidney and ureter: Secondary | ICD-10-CM | POA: Insufficient documentation

## 2019-02-22 DIAGNOSIS — Z83438 Family history of other disorder of lipoprotein metabolism and other lipidemia: Secondary | ICD-10-CM | POA: Insufficient documentation

## 2019-02-22 DIAGNOSIS — Z79899 Other long term (current) drug therapy: Secondary | ICD-10-CM | POA: Insufficient documentation

## 2019-02-22 DIAGNOSIS — I2510 Atherosclerotic heart disease of native coronary artery without angina pectoris: Secondary | ICD-10-CM | POA: Diagnosis not present

## 2019-02-22 DIAGNOSIS — C50811 Malignant neoplasm of overlapping sites of right female breast: Secondary | ICD-10-CM | POA: Diagnosis not present

## 2019-02-22 DIAGNOSIS — M810 Age-related osteoporosis without current pathological fracture: Secondary | ICD-10-CM | POA: Diagnosis not present

## 2019-02-22 DIAGNOSIS — D563 Thalassemia minor: Secondary | ICD-10-CM

## 2019-02-22 DIAGNOSIS — C50919 Malignant neoplasm of unspecified site of unspecified female breast: Secondary | ICD-10-CM

## 2019-02-22 DIAGNOSIS — I700 Atherosclerosis of aorta: Secondary | ICD-10-CM | POA: Diagnosis not present

## 2019-02-22 DIAGNOSIS — G629 Polyneuropathy, unspecified: Secondary | ICD-10-CM | POA: Insufficient documentation

## 2019-02-22 DIAGNOSIS — Z8042 Family history of malignant neoplasm of prostate: Secondary | ICD-10-CM | POA: Diagnosis not present

## 2019-02-22 DIAGNOSIS — Z836 Family history of other diseases of the respiratory system: Secondary | ICD-10-CM | POA: Insufficient documentation

## 2019-02-22 DIAGNOSIS — Z803 Family history of malignant neoplasm of breast: Secondary | ICD-10-CM | POA: Diagnosis not present

## 2019-02-22 DIAGNOSIS — Z8249 Family history of ischemic heart disease and other diseases of the circulatory system: Secondary | ICD-10-CM | POA: Diagnosis not present

## 2019-02-22 DIAGNOSIS — J479 Bronchiectasis, uncomplicated: Secondary | ICD-10-CM | POA: Diagnosis not present

## 2019-02-22 DIAGNOSIS — C50511 Malignant neoplasm of lower-outer quadrant of right female breast: Secondary | ICD-10-CM

## 2019-02-22 DIAGNOSIS — C779 Secondary and unspecified malignant neoplasm of lymph node, unspecified: Secondary | ICD-10-CM | POA: Diagnosis not present

## 2019-02-22 DIAGNOSIS — Z171 Estrogen receptor negative status [ER-]: Secondary | ICD-10-CM | POA: Diagnosis not present

## 2019-02-22 DIAGNOSIS — Z923 Personal history of irradiation: Secondary | ICD-10-CM | POA: Diagnosis not present

## 2019-02-22 DIAGNOSIS — Z833 Family history of diabetes mellitus: Secondary | ICD-10-CM | POA: Diagnosis not present

## 2019-02-22 DIAGNOSIS — Z79811 Long term (current) use of aromatase inhibitors: Secondary | ICD-10-CM | POA: Diagnosis not present

## 2019-02-22 LAB — COMPREHENSIVE METABOLIC PANEL
ALT: 14 U/L (ref 0–44)
AST: 21 U/L (ref 15–41)
Albumin: 3.6 g/dL (ref 3.5–5.0)
Alkaline Phosphatase: 94 U/L (ref 38–126)
Anion gap: 9 (ref 5–15)
BUN: 21 mg/dL (ref 8–23)
CO2: 27 mmol/L (ref 22–32)
Calcium: 8.8 mg/dL — ABNORMAL LOW (ref 8.9–10.3)
Chloride: 104 mmol/L (ref 98–111)
Creatinine, Ser: 1.1 mg/dL — ABNORMAL HIGH (ref 0.44–1.00)
GFR calc Af Amer: >60 mL/min (ref >60)
GFR calc non Af Amer: 53 mL/min — ABNORMAL LOW (ref >60)
Glucose, Bld: 102 mg/dL — ABNORMAL HIGH (ref 70–99)
Potassium: 4 mmol/L (ref 3.5–5.1)
Sodium: 140 mmol/L (ref 135–145)
Total Bilirubin: 0.3 mg/dL (ref 0.3–1.2)
Total Protein: 7.4 g/dL (ref 6.5–8.1)

## 2019-02-22 LAB — CBC WITH DIFFERENTIAL/PLATELET
Abs Immature Granulocytes: 0.02 10*3/uL (ref 0.00–0.07)
Basophils Absolute: 0 10*3/uL (ref 0.0–0.1)
Basophils Relative: 0 %
Eosinophils Absolute: 0.2 10*3/uL (ref 0.0–0.5)
Eosinophils Relative: 3 %
HCT: 30.9 % — ABNORMAL LOW (ref 36.0–46.0)
Hemoglobin: 9.9 g/dL — ABNORMAL LOW (ref 12.0–15.0)
Immature Granulocytes: 0 %
Lymphocytes Relative: 15 %
Lymphs Abs: 1.1 10*3/uL (ref 0.7–4.0)
MCH: 25.4 pg — ABNORMAL LOW (ref 26.0–34.0)
MCHC: 32 g/dL (ref 30.0–36.0)
MCV: 79.2 fL — ABNORMAL LOW (ref 80.0–100.0)
Monocytes Absolute: 0.7 10*3/uL (ref 0.1–1.0)
Monocytes Relative: 10 %
Neutro Abs: 5.5 10*3/uL (ref 1.7–7.7)
Neutrophils Relative %: 72 %
Platelets: 255 10*3/uL (ref 150–400)
RBC: 3.9 MIL/uL (ref 3.87–5.11)
RDW: 15 % (ref 11.5–15.5)
WBC: 7.6 10*3/uL (ref 4.0–10.5)
nRBC: 0 % (ref 0.0–0.2)

## 2019-02-23 ENCOUNTER — Telehealth: Payer: Self-pay | Admitting: Oncology

## 2019-02-23 NOTE — Telephone Encounter (Signed)
I talk with patient regarding schedule  

## 2019-02-26 NOTE — Telephone Encounter (Signed)
Paperwork has been received and in [place] at front

## 2019-03-02 LAB — MYOSITIS ASSESSR PLUS JO-1 AUTOABS
EJ Autoabs: NOT DETECTED
Jo-1 Autoabs: <1 AI
Ku Autoabs: NOT DETECTED
Mi-2 Autoabs: NOT DETECTED
OJ Autoabs: NOT DETECTED
PL-12 Autoabs: NOT DETECTED
PL-7 Autoabs: NOT DETECTED
SRP Autoabs: NOT DETECTED

## 2019-03-02 LAB — HIV ANTIBODY (ROUTINE TESTING W REFLEX): HIV 1&2 Ab, 4th Generation: NONREACTIVE

## 2019-03-02 LAB — ANA: Anti Nuclear Antibody (ANA): NEGATIVE

## 2019-03-02 LAB — CYCLIC CITRUL PEPTIDE ANTIBODY, IGG: Cyclic Citrullin Peptide Ab: <16 UNITS

## 2019-03-02 LAB — RHEUMATOID FACTOR: Rhuematoid fact SerPl-aCnc: <14 IU/mL (ref <14)

## 2019-03-06 NOTE — Telephone Encounter (Signed)
Lauren, can this be closed?

## 2019-03-07 ENCOUNTER — Other Ambulatory Visit: Payer: Self-pay

## 2019-03-07 ENCOUNTER — Ambulatory Visit (INDEPENDENT_AMBULATORY_CARE_PROVIDER_SITE_OTHER): Payer: Medicare HMO | Admitting: Pulmonary Disease

## 2019-03-07 DIAGNOSIS — J849 Interstitial pulmonary disease, unspecified: Secondary | ICD-10-CM | POA: Diagnosis not present

## 2019-03-07 NOTE — Patient Instructions (Signed)
Interstitial Lung Disease (ILD) Today, we discussed your ILD questionnaire and ILD labs. We discussed that biopsy would be the next step in your work-up. Options include bronchoscopy versus surgery for tissue sampling to determine the cause of your interstitial lung disease. We discussed the risks and benefits of both procedures. We also discussed remaining conservative with only repeating CT scans however with this option we do have a risk of your ILD progressing.  Please take the time to consider each option and discuss with your family the best option for you. I have scheduled a telephone visit on 03/22/19 to make a decision but please feel free to contact me or the office if you make a decision earlier on so we can schedule your procedure.  Follow-up with me on 03/22/19

## 2019-03-07 NOTE — Telephone Encounter (Signed)
Patient has appt with Emily Perez today will close message

## 2019-03-07 NOTE — Progress Notes (Signed)
Virtual Visit via Telephone Note  I connected with Emily Perez on 03/07/19 at 11:30 AM EST by telephone and verified that I am speaking with the correct person using two identifiers.  Location: Patient: Home Provider: [facility]   I discussed the limitations, risks, security and privacy concerns of performing an evaluation and management service by telephone and the availability of in person appointments. I also discussed with the patient that there may be a patient responsible charge related to this service. The patient expressed understanding and agreed to proceed.   I discussed the assessment and treatment plan with the patient. The patient was provided an opportunity to ask questions and all were answered. The patient agreed with the plan and demonstrated an understanding of the instructions.   The patient was advised to call back or seek an in-person evaluation if the symptoms worsen or if the condition fails to improve as anticipated.  I provided 25 minutes of non-face-to-face time during this encounter.   Deirdra Heumann Rodman Pickle, MD    Subjective:   PATIENT ID: Emily Perez GENDER: female DOB: 04/12/1955, MRN: [id number]   HPI  Chief Complaint  Patient presents with  . Follow-up    Cough Interstital Lung Disease    Reason for Visit: F/u  [name] is a 63 year old female with history of right breast cancer s/p lumpectomy/chemotherapy/radiation, hypertension and hyperlipidemia who presents for follow-up for ILD work-up.  Since our last visit, she has unchanged chronic unproductive hacky cough that can be triggered with talking, deep breaths and brushing her teeth. She will have dyspnea on moderate exertion but this is not her main issue.   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 Another sister with ILD (unknown etiology with bronchoscopy and surgical biopsy, hx fibromyalgia). Both are on Ofev  Environmental  exposures: Sat during construction around 15 years ago, for 15 years, chemotherapy/radiation  I have personally reviewed patient's past medical/family/social history/allergies/current medications.  Past Medical History:  Diagnosis Date  . Allergic rhinitis   . Arthritis   . Breast cancer ([place])   . Family history of breast cancer   . Family history of prostate cancer   . Headache    migraines in the past  . Hyperlipidemia   . Hypertension   . PONV (postoperative nausea and vomiting)    after 1st colonoscopy     Family History  Problem Relation Age of Onset  . Heart disease Mother   . Cancer Father   . Diabetes Father   . Hypertension Father   . Hyperlipidemia Father   . Emphysema Father   . Heart disease Brother   . Hypertension Brother   . Hyperlipidemia Brother   . Diabetes Brother   . Prostate cancer Brother 54  . Breast cancer Sister 18  . Breast cancer Sister 14  . Asthma Sister   . Cancer Paternal Aunt        unsure what type of cancer she had  . Prostate cancer Paternal Uncle   . Diabetes Brother   . Gout Brother   . Kidney disease Brother   . Lung disease Sister   . Breast cancer Sister      Social History   Occupational History  . Not on file  Tobacco Use  . Smoking status: Never Smoker  . Smokeless tobacco: Never Used  Substance and Sexual Activity  . Alcohol use: No  . Drug use: No  . Sexual  activity: Not on file    Allergies  Allergen Reactions  . Latex Hives and Rash     Outpatient Medications Prior to Visit  Medication Sig Dispense Refill  . aspirin 81 MG tablet Take 81 mg by mouth 3 (three) times a week.     Marland Kitchen doxylamine, Sleep, (UNISOM) 25 MG tablet Take 50 mg by mouth at bedtime as needed.    . famotidine (PEPCID) 20 MG tablet Take 1 tablet (20 mg total) by mouth 2 (two) times daily. 60 tablet 3  . meclizine (ANTIVERT) 12.5 MG tablet     . Multiple Vitamins-Minerals (MULTIVITAMIN WITH MINERALS) tablet Take 1 tablet by mouth daily.     . naproxen sodium (ANAPROX) 550 MG tablet Take 550 mg by mouth daily as needed (pain). Takes 0.5-1 tablet as needed for pain     . ondansetron (ZOFRAN-ODT) 8 MG disintegrating tablet     . OVER THE COUNTER MEDICATION Apply 1 application topically at bedtime as needed. Pain Relief Balm     . polycarbophil (FIBERCON) 625 MG tablet Take 625 mg by mouth as needed.    Marland Kitchen scopolamine (TRANSDERM-SCOP) 1 MG/3DAYS UNW AND APP 1 PA TO SKIN PRN  5  . triamterene-hydrochlorothiazide (DYAZIDE) 37.5-25 MG capsule Take 1 capsule by mouth daily.  3  . vitamin B-12 (CYANOCOBALAMIN) 1000 MCG tablet Take 500 mcg by mouth daily as needed. Takes 0.5 tablet     . zaleplon (SONATA) 5 MG capsule TK 1 C PO QD HS PRN  5  . Triamcinolone Acetonide 0.025 % LOTN Apply 1 application topically 3 (three) times daily. 60 mL 1  . anastrozole (ARIMIDEX) 1 MG tablet Take 1 tablet (1 mg total) by mouth daily. (Patient not taking: Reported on 03/07/2019) 90 tablet 4  . mometasone-formoterol (DULERA) 100-5 MCG/ACT AERO Inhale 2 puffs into the lungs 2 (two) times daily. (Patient not taking: Reported on 03/07/2019) 8.8 g 0  . omeprazole (PRILOSEC) 20 MG capsule Take 1 capsule (20 mg total) by mouth 2 (two) times daily before a meal. (Patient not taking: Reported on 03/07/2019) 60 capsule 1   No facility-administered medications prior to visit.    Review of Systems  Constitutional: Negative for chills, diaphoresis, fever, malaise/fatigue and weight loss.  HENT: Negative for congestion.   Respiratory: Positive for cough. Negative for hemoptysis, sputum production, shortness of breath and wheezing.   Cardiovascular: Negative for chest pain, palpitations and leg swelling.     Objective:   There were no vitals filed for this visit.    Physical Exam: No apparent distress on the phone  Data Reviewed:  Imaging: CT chest 05/18/2017-normal parenchyma with no evidence of interstitial thickening/markings, no evidence of infiltrate,  effusion or edema.  CT Chest 01/30/2019-new bandlike fibrosis in the right upper lung, mild patchy subpleural reticulation and ground glass opacities  PFT: 02/19/19 FVC 1.85 (74%) FEV1 1.61 (84%) Ratio 81  SVC 79% DLCO 57% Interpretation: Mild interstitial lung disease with moderately reduced DLCO  TTE: 05/17/2017 EF 55 to 60%.  Grade 1 diastolic dysfunction present.  No WMA or valvular abnormalities  Labs: ANA - neg CCP <16 HIV - non-reactive Myositis panel - neg RF <14  Imaging, labs and test noted above have been reviewed independently by me.  Assessment & Plan:   Discussion: 63 year old female with hx of right breast cancer s/p lumpectomy/chemotherapy/radiation who presents for follow-up of her ILD. Cough is likely related to ILD. We reviewed ILD questionnaire and labs. Would recommend tissue  sampling at this point for formal diagnosis.  Interstitial Lung Disease (ILD) Today, we discussed your ILD questionnaire and ILD labs. We discussed that biopsy would be the next step in your work-up. Options include bronchoscopy versus surgery for tissue sampling to determine the cause of your interstitial lung disease. We discussed the risks and benefits of both procedures. We also discussed remaining conservative with only repeating CT scans however with this option we do have a risk of your ILD progressing.  Please take the time to consider each option and discuss with your family the best option for you. I have scheduled a telephone visit on 03/22/19 to make a decision but please feel free to contact me or the office if you make a decision earlier on so we can schedule your procedure.  --CONTINUE Dulera  Follow-up with me on 03/22/19  Health Maintenance - Due for pneumococcal and influenza Immunization History  Administered Date(s) Administered  . Zoster Recombinat (Shingrix) 01/10/2018, 01/30/2019   CT Lung Screen - Not qualified  No orders of the defined types were placed in  this encounter.  No orders of the defined types were placed in this encounter.  Return in about 15 days (around 03/22/2019).  Greater than 50% of this patient 45-minute office visit was spent face-to-face in counseling with the patient/family. We discussed medical diagnosis and treatment plan as noted.  [place], MD [facility] 03/07/2019 11:53 AM  Office Number [phone]

## 2019-03-14 ENCOUNTER — Ambulatory Visit: Payer: Medicare HMO | Admitting: Pulmonary Disease

## 2019-03-20 ENCOUNTER — Other Ambulatory Visit: Payer: Self-pay | Admitting: Pulmonary Disease

## 2019-03-21 ENCOUNTER — Other Ambulatory Visit: Payer: Self-pay | Admitting: General Surgery

## 2019-03-21 DIAGNOSIS — R053 Chronic cough: Secondary | ICD-10-CM

## 2019-03-21 DIAGNOSIS — J849 Interstitial pulmonary disease, unspecified: Secondary | ICD-10-CM

## 2019-03-21 DIAGNOSIS — R05 Cough: Secondary | ICD-10-CM

## 2019-03-21 MED ORDER — MOMETASONE FURO-FORMOTEROL FUM 100-5 MCG/ACT IN AERO: 2.0000 | INHALATION_SPRAY | Freq: Two times a day (BID) | RESPIRATORY_TRACT | 6 refills | Status: DC

## 2019-03-21 NOTE — Progress Notes (Signed)
Fax received from [facility] for [facility] refill. Prescription sent. Nothing further needed at this time.

## 2019-03-22 ENCOUNTER — Ambulatory Visit (INDEPENDENT_AMBULATORY_CARE_PROVIDER_SITE_OTHER): Payer: Medicare HMO | Admitting: Pulmonary Disease

## 2019-03-22 ENCOUNTER — Other Ambulatory Visit: Payer: Self-pay

## 2019-03-22 DIAGNOSIS — J849 Interstitial pulmonary disease, unspecified: Secondary | ICD-10-CM | POA: Diagnosis not present

## 2019-03-22 NOTE — Progress Notes (Signed)
Virtual Visit via Telephone Note  I connected with Emily Perez on 03/22/19 at 10:30 AM EST by telephone and verified that I am speaking with the correct person using two identifiers.  Location: Patient: Home Provider: [facility]   I discussed the limitations, risks, security and privacy concerns of performing an evaluation and management service by telephone and the availability of in person appointments. I also discussed with the patient that there may be a patient responsible charge related to this service. The patient expressed understanding and agreed to proceed.   I discussed the assessment and treatment plan with the patient. The patient was provided an opportunity to ask questions and all were answered. The patient agreed with the plan and demonstrated an understanding of the instructions.   The patient was advised to call back or seek an in-person evaluation if the symptoms worsen or if the condition fails to improve as anticipated.  I provided 24 minutes of non-face-to-face time during this encounter.   Takeria Marquina Rodman Pickle, MD     Subjective:   PATIENT ID: Emily Perez GENDER: female DOB: 1955-12-25, MRN: [id number]   HPI  Chief Complaint  Patient presents with  . Follow-up   Reason for Visit: F/u  [name] is a 63 year old female with history of right breast cancer s/p lumpectomy/chemotherapy/radiation, hypertension and hyperlipidemia who presents for follow-up of ILD.  Today, we scheduled a telephone visit to discuss her decision regarding biopsy vs surveillance of her interstitial lung disease. She has an unchanged unproductive cough that can be triggered with talking, deep breaths and brushing her teeth. She has some dyspnea on exertion with strenuous activity but this is not an issue for her.  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 Another sister with ILD (unknown etiology with bronchoscopy and  surgical biopsy, hx fibromyalgia). 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  Past Medical History:  Diagnosis Date  . Allergic rhinitis   . Arthritis   . Breast cancer ([place])   . Family history of breast cancer   . Family history of prostate cancer   . Headache    migraines in the past  . Hyperlipidemia   . Hypertension   . PONV (postoperative nausea and vomiting)    after 1st colonoscopy     Family History  Problem Relation Age of Onset  . Heart disease Mother   . Cancer Father   . Diabetes Father   . Hypertension Father   . Hyperlipidemia Father   . Emphysema Father   . Heart disease Brother   . Hypertension Brother   . Hyperlipidemia Brother   . Diabetes Brother   . Prostate cancer Brother 35  . Breast cancer Sister 21  . Breast cancer Sister 66  . Asthma Sister   . Cancer Paternal Aunt        unsure what type of cancer she had  . Prostate cancer Paternal Uncle   . Diabetes Brother   . Gout Brother   . Kidney disease Brother   . Lung disease Sister   . Breast cancer Sister      Social History   Occupational History  . Not on file  Tobacco Use  . Smoking status: Never Smoker  . Smokeless tobacco: Never Used  Substance and Sexual Activity  . Alcohol use: No  .  Drug use: No  . Sexual activity: Not on file    Allergies  Allergen Reactions  . Latex Hives and Rash     Outpatient Medications Prior to Visit  Medication Sig Dispense Refill  . anastrozole (ARIMIDEX) 1 MG tablet Take 1 tablet (1 mg total) by mouth daily. (Patient not taking: Reported on 03/07/2019) 90 tablet 4  . aspirin 81 MG tablet Take 81 mg by mouth 3 (three) times a week.     Marland Kitchen doxylamine, Sleep, (UNISOM) 25 MG tablet Take 50 mg by mouth at bedtime as needed.    . famotidine (PEPCID) 20 MG tablet  Take 1 tablet (20 mg total) by mouth 2 (two) times daily. 60 tablet 3  . meclizine (ANTIVERT) 12.5 MG tablet     . mometasone-formoterol (DULERA) 100-5 MCG/ACT AERO Inhale 2 puffs into the lungs 2 (two) times daily. 8.8 g 6  . Multiple Vitamins-Minerals (MULTIVITAMIN WITH MINERALS) tablet Take 1 tablet by mouth daily.    . naproxen sodium (ANAPROX) 550 MG tablet Take 550 mg by mouth daily as needed (pain). Takes 0.5-1 tablet as needed for pain     . omeprazole (PRILOSEC) 20 MG capsule TAKE 1 CAPSULE (20 MG TOTAL) BY MOUTH 2 (TWO) TIMES DAILY BEFORE A MEAL. 60 capsule 1  . ondansetron (ZOFRAN-ODT) 8 MG disintegrating tablet     . OVER THE COUNTER MEDICATION Apply 1 application topically at bedtime as needed. Pain Relief Balm     . polycarbophil (FIBERCON) 625 MG tablet Take 625 mg by mouth as needed.    Marland Kitchen scopolamine (TRANSDERM-SCOP) 1 MG/3DAYS UNW AND APP 1 PA TO SKIN PRN  5  . triamterene-hydrochlorothiazide (DYAZIDE) 37.5-25 MG capsule Take 1 capsule by mouth daily.  3  . vitamin B-12 (CYANOCOBALAMIN) 1000 MCG tablet Take 500 mcg by mouth daily as needed. Takes 0.5 tablet     . zaleplon (SONATA) 5 MG capsule TK 1 C PO QD HS PRN  5   No facility-administered medications prior to visit.    Review of Systems  Constitutional: Negative for chills, diaphoresis, fever, malaise/fatigue and weight loss.  HENT: Negative for congestion.   Respiratory: Positive for cough. Negative for hemoptysis, sputum production, shortness of breath and wheezing.   Cardiovascular: Negative for chest pain, palpitations and leg swelling.     Objective:   There were no vitals filed for this visit.    Physical Exam: No apparent distress on the phone  Data Reviewed:  Imaging: CT chest 05/18/2017-normal parenchyma with no evidence of interstitial thickening/markings, no evidence of infiltrate, effusion or edema.  CT Chest 01/30/2019-new bandlike fibrosis in the right upper lung, mild patchy subpleural  reticulation and ground glass opacities  PFT: 02/19/19 FVC 1.85 (74%) FEV1 1.61 (84%) Ratio 81  SVC 79% DLCO 57% Interpretation: Mild interstitial lung disease with moderately reduced DLCO  TTE: 05/17/2017 EF 55 to 60%.  Grade 1 diastolic dysfunction present.  No WMA or valvular abnormalities  Labs: ANA - neg CCP <16 HIV - non-reactive Myositis panel - neg RF <14  Imaging, labs and test noted above have been reviewed independently by me.  Assessment & Plan:   Discussion:  63 year old female with history of right breast cancer s/p lumpectomy/chemotherapy/radiation, hypertension and hyperlipidemia who presents for ILD follow-up. Family history is significant for two sisters with IPF. After reviewing ILD questionnaire and labs, I had recommend tissue sampling at this point for formal diagnosis. Patient would like to defer invasive testing and opt for continued  radiologic surveillance, understanding that her ILD could potentially progress.  Interstitial Lung Disease (ILD) --High resolution CT Chest to be scheduled on 05/01/18 or later --CONTINUE Dulera. Will message pharmacy for best cost and where inhaler is available. --Will arrange to be seen by Dr. Chase Caller in ILD clinic to discuss CT results  Health Maintenance - Due for pneumococcal and influenza Immunization History  Administered Date(s) Administered  . Zoster Recombinat (Shingrix) 01/10/2018, 01/30/2019   CT Lung Screen - Not qualified  Orders Placed This Encounter  Procedures  . CT Chest High Resolution    [facility] [facility] [phone]/ [employer]     Standing Status:   Future    Standing Expiration Date:   05/19/2020    Scheduling Instructions:     To be done 05/01/18 if possible at the request of Dr. Loanne Drilling. If that date is not available, then the closest to it please. With office visit to discuss results.    Order Specific Question:   Preferred imaging location?    Answer:   [facility]    Order Specific  Question:   Radiology Contrast Protocol - do NOT remove file path    Answer:   [email or web address]   No orders of the defined types were placed in this encounter.  Return in about 7 weeks (around 05/10/2019) for After CT Chest.   Lorrin Nawrot Rodman Pickle, MD [facility] 03/22/2019 8:50 AM  Office Number [phone]

## 2019-03-22 NOTE — Patient Instructions (Signed)
Interstitial Lung Disease (ILD) --High resolution CT Chest to be scheduled on ~05/01/18  --CONTINUE Dulera. We will message pharmacy team for best cost and where inhaler is available.

## 2019-03-26 IMAGING — MR MR BREAST BILAT WO/W CM
10 of 14 series · 32 of 48 positions shown · non-contrast
Comparison: Previous exam(s).

CLINICAL DATA: 61-year-old female with triple negative right breast
cancer metastatic to the right axilla. She was initially diagnosed
in Wednesday March, 2017, and presents today to evaluate for response to
neoadjuvant chemotherapy. She has family history of breast cancer in
2 sisters diagnosed at the ages of 52 and 68.

LABS:  Labs drawn on 07/19/2017 indicate creatinine is 1.08 mg/dL
and GFR is 53.
EXAM:
BILATERAL BREAST MRI WITH AND WITHOUT CONTRAST
TECHNIQUE: Multiplanar, multisequence MR images of both breasts were obtained
prior to and following the intravenous administration of ml of 17

[Series 2: t2_tirm_tra ipat (a-p) · axial · 3.0mm · 0.70mm/px · 1 of 63 slices shown]
[im 1/63]
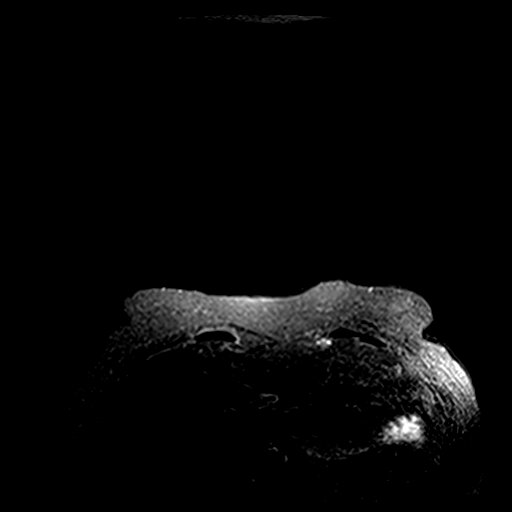

[Series 2: STIR · axial · 3.0mm · 0.94mm/px · 1 of 67 slices shown]
[im 1/67]
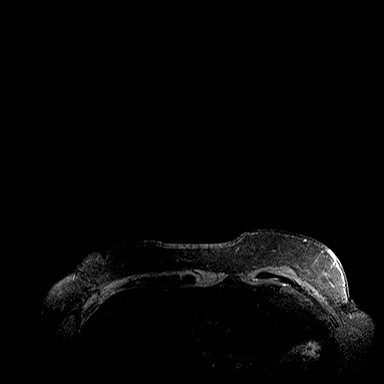

[Series 3: fl3d pre-cm no · axial · non-contrast · 0.9mm · 0.94mm/px · z∈[-101,+71]mm · 3 of 192 slices shown]
[im 1/192]
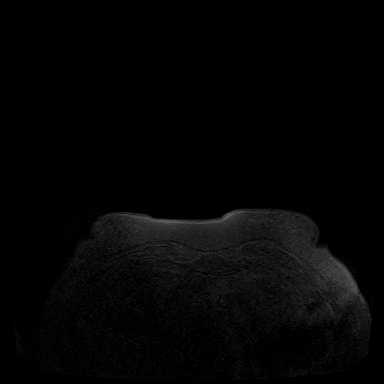
[im 96/192]
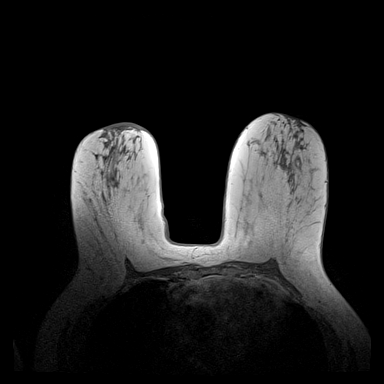
[im 192/192]
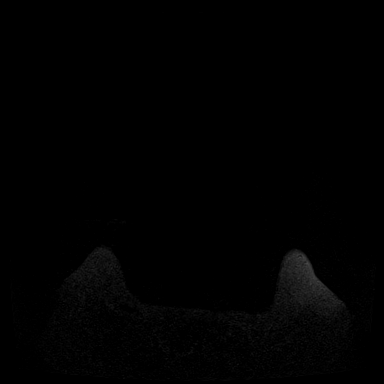

[Series 3: axial pre no · axial · non-contrast · 1.0mm · 0.86mm/px · z∈[-91,+132]mm · 5 of 224 slices shown]
[im 1/224]
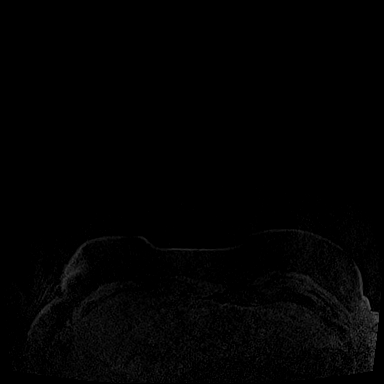
[im 56/224]
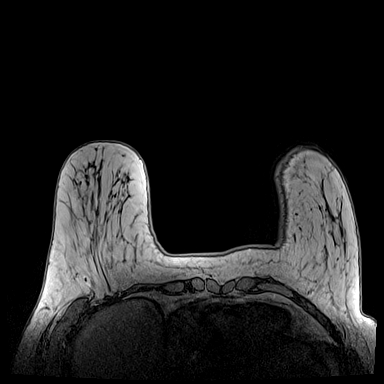
[im 112/224]
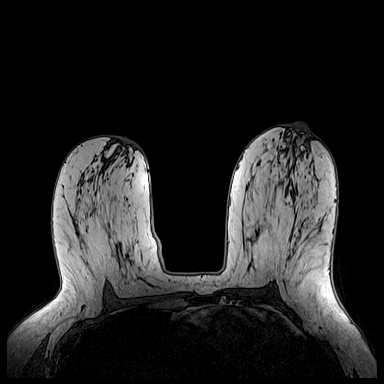
[im 168/224]
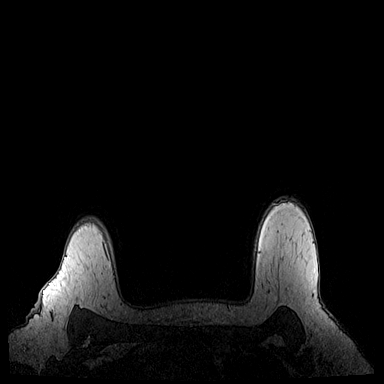
[im 224/224]
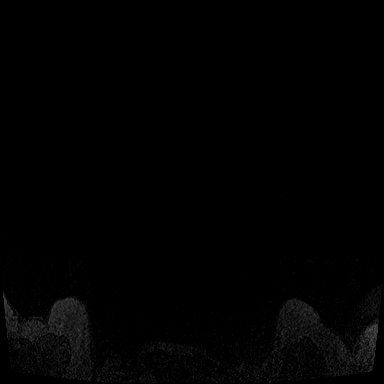

[Series 4: axial pre fs · axial · non-contrast · 1.0mm · 0.86mm/px · z∈[-91,+132]mm · 5 of 224 slices shown]
[im 1/224]
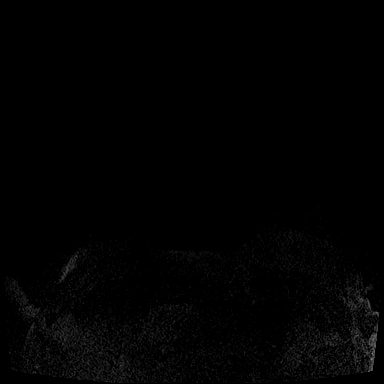
[im 56/224]
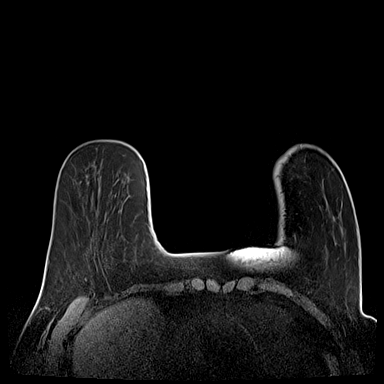
[im 112/224]
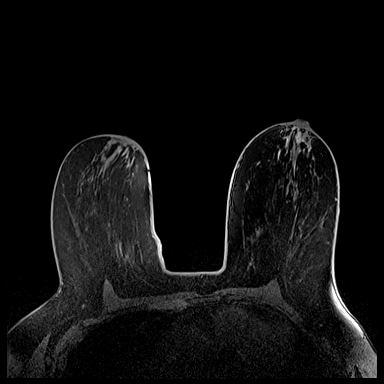
[im 168/224]
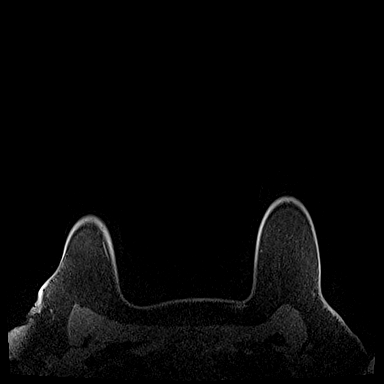
[im 224/224]
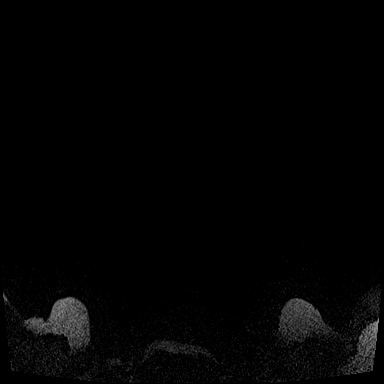

[Series 5: axial post 20 · axial · 1.0mm · 0.86mm/px · z∈[-91,+132]mm · 5 of 224 slices shown (1 of 3)]
[im 1/224]
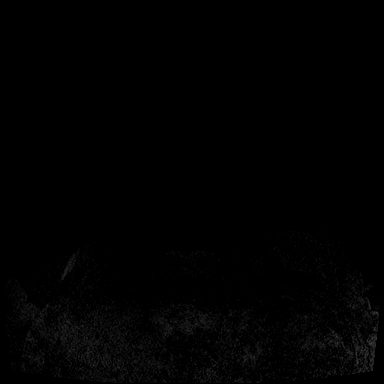
[im 56/224]
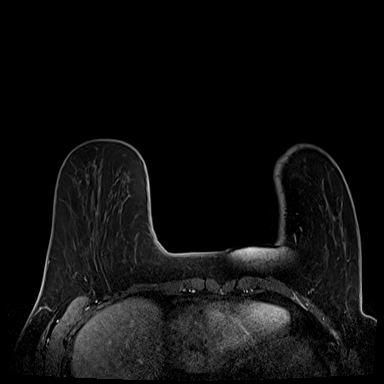
[im 112/224]
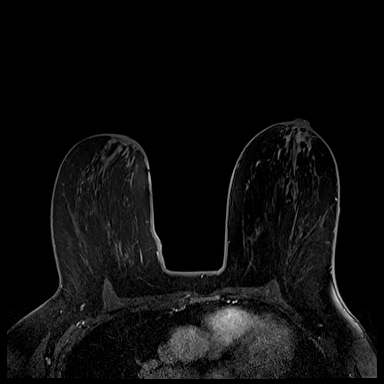
[im 168/224]
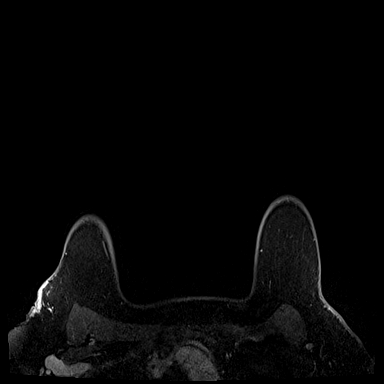
[im 224/224]
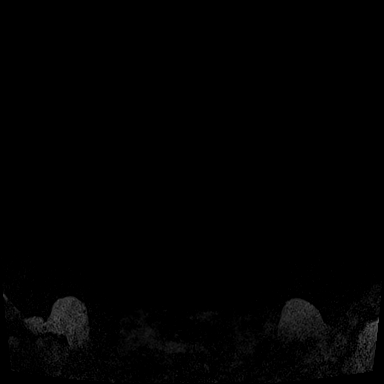

[Series 6: axial post 20 · axial · 1.0mm · 0.86mm/px · z∈[-91,+132]mm · 5 of 224 slices shown (2 of 3)]
[im 1/224]
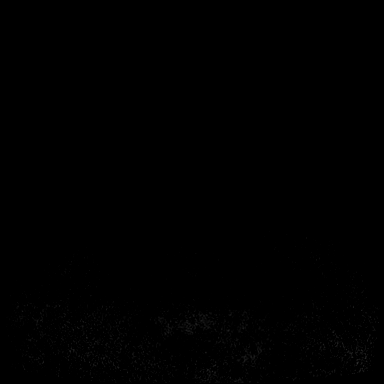
[im 56/224]
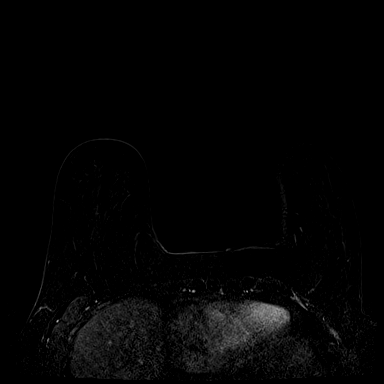
[im 112/224]
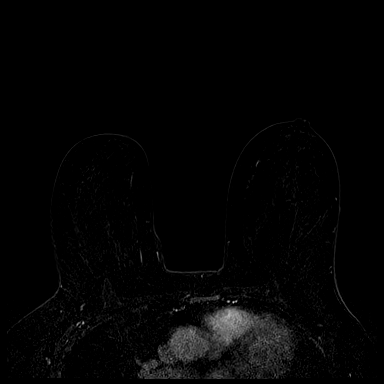
[im 168/224]
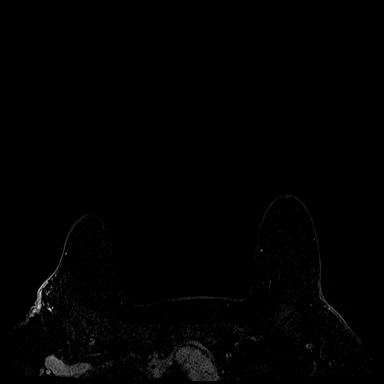
[im 224/224]
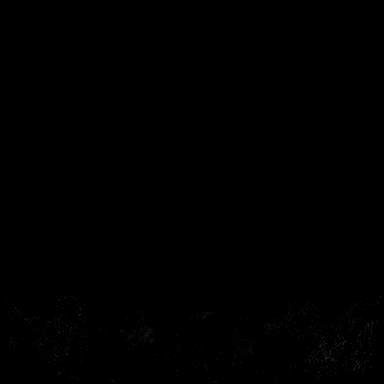

[Series 7: axial post 20 · axial · 224.0mm · 0.86mm/px · 1 of 1 slices shown (3 of 3)]
[im 1/1]
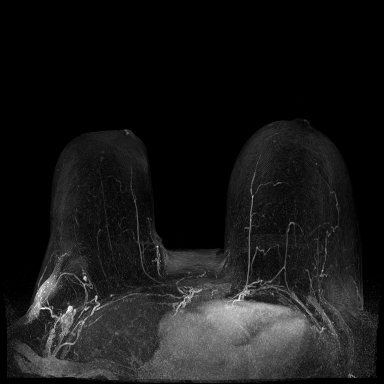

[Series 8: axial post 3 · axial · 1.0mm · 0.86mm/px · z∈[-91,+132]mm · 5 of 224 slices shown (1 of 2)]
[im 1/224]
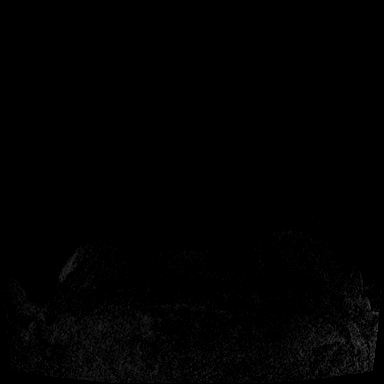
[im 56/224]
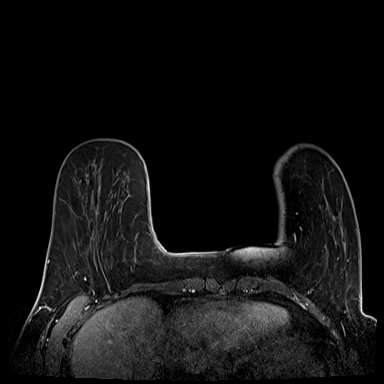
[im 112/224]
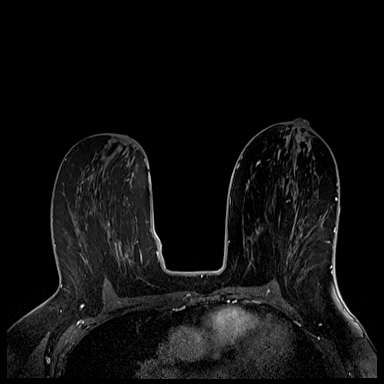
[im 168/224]
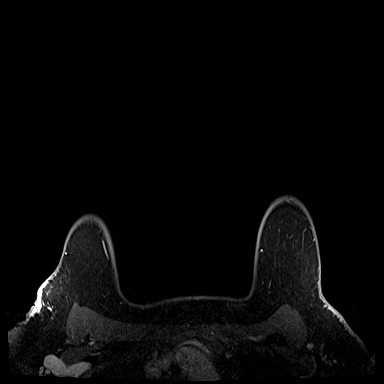
[im 224/224]
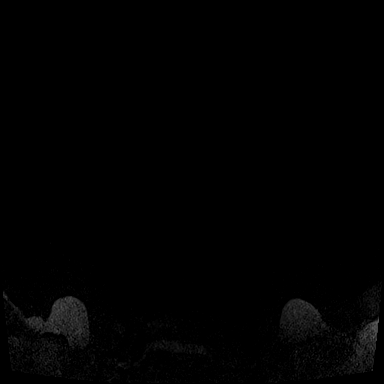

[Series 9: axial post 3 · axial · 1.0mm · 0.86mm/px · 1 of 222 slices shown (2 of 2)]
[im 1/222]
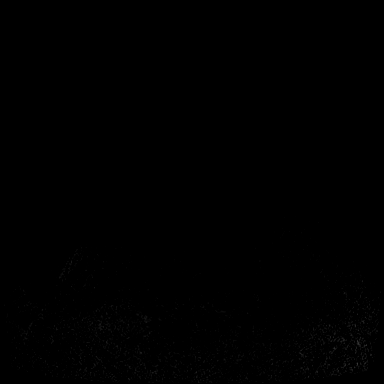

[32 of 48 positions shown; findings below may reference images not displayed]

THREE-DIMENSIONAL MR IMAGE RENDERING ON INDEPENDENT WORKSTATION:

Three-dimensional MR images were rendered by post-processing of the
original MR data on an independent workstation. The
three-dimensional MR images were interpreted, and findings are
reported in the following complete MRI report for this study. Three
dimensional images were evaluated at the independent [employer]
workstation
FINDINGS: Breast composition: b. Scattered fibroglandular tissue.

Background parenchymal enhancement: Minimal.

Right breast: There is complete resolution of the abnormal
enhancement in the right breast, previously measuring up to 6.8 cm.
A port has been placed on the upper inner quadrant of the right
breast.

Left breast: No mass or abnormal enhancement.

Lymph nodes: The enlarged right axillary lymph node has return to
normal size. No enlarged lymph nodes are identified in either
axilla. The structures presumed to represent small bilateral
intramammary lymph nodes are no longer visualized.

Ancillary findings:  None.
IMPRESSION: 1. There is complete imaging response status post neoadjuvant
chemotherapy of the known cancer in the right breast. No abnormal
enhancement is seen in the right breast.

2. The probable bilateral intramammary lymph nodes described on the
prior MRI report are no longer visualized

3.  No MRI evidence of left breast malignancy.

RECOMMENDATION:
Continue treatment plan.

BI-RADS CATEGORY  6: Known biopsy-proven malignancy.

## 2019-03-27 ENCOUNTER — Telehealth: Payer: Self-pay | Admitting: Pulmonary Disease

## 2019-03-27 NOTE — Telephone Encounter (Signed)
error 

## 2019-04-01 ENCOUNTER — Other Ambulatory Visit: Payer: Self-pay | Admitting: Pulmonary Disease

## 2019-04-02 ENCOUNTER — Encounter: Payer: Self-pay | Admitting: Pulmonary Disease

## 2019-04-04 ENCOUNTER — Telehealth: Payer: Self-pay | Admitting: Pulmonary Disease

## 2019-04-04 NOTE — Telephone Encounter (Signed)
LMTCB

## 2019-04-05 DIAGNOSIS — R69 Illness, unspecified: Secondary | ICD-10-CM | POA: Diagnosis not present

## 2019-04-11 NOTE — Telephone Encounter (Signed)
[name] asked that patient be called as message has not been touched since 1/13.  Called spoke with patient who reported that this was not a good time for her and requested that we call her back tomorrow morning after 9am.  Will route to triage to ensure patient gets taken care of.

## 2019-04-12 NOTE — Telephone Encounter (Signed)
ATC pt, no answer. Left message for pt to call back.  

## 2019-04-12 NOTE — Telephone Encounter (Signed)
Called patient regarding inhaler check.  Per pharmacy team:  Emily Perez is non formulary.   Symbicort- Copay for 1 month supply is $47.00   Breo- Copay for 1 month supply is $47.00   Advair HFA- Copay for 1 month supply is $47.00   Advair Diskus- Copay for 1 month supply is $47.00    After discussion of inhalers, Emily Perez would like to hold off on taking this medication for now. She is aware that for most of the inhalers, the co-pay is the same. If she does change her mind, I recommended either the Symbicort 80-4.5 mcg TWO puffs BID or Breo 100-54mcg ONE puff daily.  Emily Perez, M.D. [facility] 04/12/2019 6:42 PM    Staff- please ensure that she is scheduled for an ILD appointment with MR.

## 2019-04-12 NOTE — Telephone Encounter (Signed)
Called pt and she did not answer- LMTCB  

## 2019-04-13 NOTE — Telephone Encounter (Signed)
Yes she has an appt with MR 04/25/2019.

## 2019-04-25 ENCOUNTER — Ambulatory Visit (INDEPENDENT_AMBULATORY_CARE_PROVIDER_SITE_OTHER): Payer: Medicare HMO | Admitting: Internal Medicine

## 2019-04-25 ENCOUNTER — Other Ambulatory Visit: Payer: Self-pay

## 2019-04-25 ENCOUNTER — Encounter: Payer: Self-pay | Admitting: Internal Medicine

## 2019-04-25 VITALS — BP 120/74 | HR 75 | Temp 97.3°F | Ht 63.5 in | Wt 168.8 lb

## 2019-04-25 DIAGNOSIS — Z836 Family history of other diseases of the respiratory system: Secondary | ICD-10-CM | POA: Diagnosis not present

## 2019-04-25 DIAGNOSIS — R05 Cough: Secondary | ICD-10-CM

## 2019-04-25 DIAGNOSIS — J849 Interstitial pulmonary disease, unspecified: Secondary | ICD-10-CM | POA: Diagnosis not present

## 2019-04-25 DIAGNOSIS — R053 Chronic cough: Secondary | ICD-10-CM

## 2019-04-25 NOTE — Patient Instructions (Signed)
ICD-10-CM   1. ILD (interstitial lung disease) ([place])  J84.9   2. Chronic cough  R05   3. Family history of pulmonary fibrosis  Z83.6     Mild and stable x 2 years Probably not IPF which your sisters x 2 have Probably non-IPF stable varity provoked by radiation and genetics Still there is a < 20% chance this is IPF  Plan  - support non-biopsy approach  - supprt serial monitoring approach - cancel upcoming HRCT - do spirometry and dlco in 3-4 months - we can discuss visiting genetic counselor Roma Kayser at next visit   Followup  - 3-4 months regular clinic - do ILD symptoms score shhet and simple walk test at followup

## 2019-04-25 NOTE — Progress Notes (Signed)
OV 04/25/2019 -ILD center visit.  Referred by Dr. Rodman Pickle  Subjective:  Patient ID: Emily Perez, female , DOB: 14-Aug-1955 , age 64 y.o. , MRN: [id number] , [place] [address]   04/25/2019 -   Chief Complaint  Patient presents with  . Consult    Referred by Dr. Loanne Drilling for possible ILD. Patient reports dry cough. She denies sob.      HPI Emily Perez 64 y.o. -    [facility] Comprehensive ILD Questionnaire  Symptoms: *Insidious onset of cough that started in March 2020 following radiation chemotherapy in 2019 for breast cancer on the right side.  Since it started the cough is the same.  Is moderate but occasionally severe.  She does cough at night particularly the cough is worse towards the end of the day but not necessarily wakes her up at night.  Cough is worse with brushing of the teeth and also increase mobility.  In the past she is coughed up an occasional blood.  The cough is not worse when she lies down does not affect her voice.  She does clear her throat sometimes.  There is a tickle in the throat.  She does not have any dyspnea except extremely mild dyspnea when she climbs stairs.  There is no wheezing.  SYMPTOM SCALE - ILD 04/25/2019   O2 use ra  Shortness of Breath 0 -> 5 scale with 5 being worst (score 6 If unable to do)  At rest 0  Simple tasks - showers, clothes change, eating, shaving 0  Household (dishes, doing bed, laundry) 0  Shopping 0  Walking level at own pace 0  Walking up Stairs 2  Total (30-36) Dyspnea Score 2  How bad is your cough? 2.5  How bad is your fatigue yes  How bad is nausea x  How bad is vomiting?  x  How bad is diarrhea? x  How bad is anxiety? x  How bad is depression x       Past Medical History : Denies any asthma, COPD, heart failure, rheumatoid arthritis, scleroderma, lupus, polymyositis.  Denies Sjogren's or hiatal hernia acid reflux.  Denies any sleep apnea denies any HIV.   Denies pulmonary hypertension denies diabetes denies thyroid disease denies stroke.  Denies seizures denies mononucleosis or hepatitis.  Denies kidney disease.  Denies pneumonia.  Denies blood clots denies heart disease denies pleurisy.  She has hypertension and orthopedic problems with a phone meniscus.   ROS: Positive for fatigue, arthralgia, pain in her fingers due to neuropathy contractures but no [place].  She has lost 36 pounds of weight because of breast cancer treatment in the last 2 years.  She is known to have chronic dermatitis.  Otherwise no nausea vomiting or dysphagia   FAMILY HISTORY of LUNG DISEASE: Strongly positive for pulmonary fibrosis.  She believes both sisters have idiopathic pulmonary fibrosis.  She has a sister currently 87 years of age who has had IPF since she was approximately 35.  This is just on nintedanib.  Analysis is in [place] who is 66 years of age and has been diagnosed with IPF for the last few months the sister is also on nintedanib.  Based on their experience of nintedanib patient is reluctant to do nintedanib.  The sisters also have fibromyalgia.   EXPOSURE HISTORY:-There were kids but dad smoked.  Otherwise no smoking exposure.  No marijuana use currently.  In the past she did  smoke marijuana 1980s occasionally.  Never use cocaine abuse intravenous drug use.   HOME and HOBBY DETAILS : [place] last several months.  Age of the home is 62 years.  Strongly denies all the mold and mildew questions and any organic antigen exposure questions in our history   OCCUPATIONAL HISTORY (122 questions) : Positive for possible asbestos exposure when she was at [facility] and working that there is some renovation going on for 2 years.  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  In this renovation at [facility] she did work in a dusty environment.   They were removing asbestos from an old building.   PULMONARY TOXICITY HISTORY (27 items): In August 2019 she underwent right breast radiation for 32 days.  Before that that she got chemo and radiation    Testing history as below  Results for Emily, Perez (MRN [id number]) as of 04/25/2019 12:06  Ref. Range 02/19/2019 09:40  FVC-Pre Latest Units: L 1.99  FVC-%Pred-Pre Latest Units: % 80  FEV1-Pre Latest Units: L 1.61  FEV1-%Pred-Pre Latest Units: % 83  Pre FEV1/FVC ratio Latest Units: % 81  FEV1FVC-%Pred-Pre Latest Units: % 103  Results for Emily, Perez (MRN [id number]) as of 04/25/2019 12:06  Ref. Range 02/19/2019 09:40  DLCO unc Latest Units: ml/min/mmHg 11.12  DLCO unc % pred Latest Units: % 57    Results for Emily, Perez (MRN [id number]) as of 04/25/2019 12:06  Ref. Range 02/20/2019 11:11  Anti Nuclear Antibody (ANA) Latest Ref Range: NEGATIVE  NEGATIVE  Cyclic Citrullin Peptide Ab Latest Units: UNITS <16  [employer] Latest Ref Range: NOT DETECT  NOT DETECTED  RA Latex Turbid. Latest Ref Range: <14 IU/mL <14    HRCT NOV 2020 -personally visualized and personally interpreted the result and agree with the formal report from the radiologist.  IMPRESSION: 1. New bandlike radiation fibrosis in the peripheral mid to upper right lung. 2. Chronic mild patchy subpleural reticulation and ground-glass attenuation throughout both lungs without bronchiectasis or honeycombing, unchanged since 05/18/2017 chest CT. Findings may represent a mild nonprogressive underlying interstitial lung disease such as nonspecific interstitial pneumonia (NSIP). Usual interstitial pneumonia (UIP) is considered less likely but not entirely excluded. Findings are indeterminate for UIP per consensus guidelines: Diagnosis of Idiopathic Pulmonary Fibrosis: An Official ATS/ERS/JRS/ALAT Clinical Practice Guideline. [place], Iss 5, [phone], Nov 20 2016. 3. One vessel  coronary atherosclerosis. 4. No thoracic adenopathy or other findings of metastatic disease in the chest.  Aortic Atherosclerosis (ICD10-I70.0).   Electronically Signed   By: Ilona Sorrel M.D.   On: 01/30/2019 15:13   ROS - per HPI     has a past medical history of Allergic rhinitis, Arthritis, Breast cancer ([place]), Family history of breast cancer, Family history of prostate cancer, Headache, Hyperlipidemia, Hypertension, and PONV (postoperative nausea and vomiting).   reports that she has never smoked. She has never used smokeless tobacco.  Past Surgical History:  Procedure Laterality Date  . BREAST LUMPECTOMY WITH RADIOACTIVE SEED AND SENTINEL LYMPH NODE BIOPSY Right 08/30/2017   Procedure: RIGHT BREAST RADIOACTIVE SEED GUIDED LUMPECTOMY WITH RIGHT RADIOACTIVE SEED TARGETED AXILLARY LYMPH NODE EXCISION AND RIGHT SENTINEL LYMPH NODE BIOPSY;  Surgeon: Rolm Bookbinder, MD;  Location: [facility];  Service: General;  Laterality: Right;  . COLONOSCOPY    .  FOOT SURGERY Bilateral   . FRACTURE SURGERY Left    wrist  . PORTACATH PLACEMENT Right 05/19/2017   Procedure: INSERTION PORT-A-CATH WITH ULTRASOUND;  Surgeon: Rolm Bookbinder, MD;  Location: [facility];  Service: General;  Laterality: Right;  . TUBAL LIGATION    . WISDOM TOOTH EXTRACTION      Allergies  Allergen Reactions  . Latex Hives and Rash    Immunization History  Administered Date(s) Administered  . Zoster Recombinat (Shingrix) 01/10/2018, 01/30/2019    Family History  Problem Relation Age of Onset  . Heart disease Mother   . Cancer Father   . Diabetes Father   . Hypertension Father   . Hyperlipidemia Father   . Emphysema Father   . Heart disease Brother   . Hypertension Brother   . Hyperlipidemia Brother   . Diabetes Brother   . Prostate cancer Brother 82  . Breast cancer Sister 60  . Breast cancer Sister 76  . Asthma Sister   . Cancer Paternal Aunt        unsure what type of cancer she had  .  Prostate cancer Paternal Uncle   . Diabetes Brother   . Gout Brother   . Kidney disease Brother   . Lung disease Sister   . Breast cancer Sister      Current Outpatient Medications:  .  anastrozole (ARIMIDEX) 1 MG tablet, Take 1 tablet (1 mg total) by mouth daily., Disp: 90 tablet, Rfl: 4 .  aspirin 81 MG tablet, Take 81 mg by mouth 3 (three) times a week. , Disp: , Rfl:  .  doxylamine, Sleep, (UNISOM) 25 MG tablet, Take 50 mg by mouth at bedtime as needed., Disp: , Rfl:  .  famotidine (PEPCID) 20 MG tablet, Take 1 tablet (20 mg total) by mouth 2 (two) times daily., Disp: 60 tablet, Rfl: 3 .  meclizine (ANTIVERT) 12.5 MG tablet, , Disp: , Rfl:  .  Multiple Vitamins-Minerals (MULTIVITAMIN WITH MINERALS) tablet, Take 1 tablet by mouth daily., Disp: , Rfl:  .  naproxen sodium (ANAPROX) 550 MG tablet, Take 550 mg by mouth daily as needed (pain). Takes 0.5-1 tablet as needed for pain , Disp: , Rfl:  .  omeprazole (PRILOSEC) 20 MG capsule, TAKE 1 CAPSULE (20 MG TOTAL) BY MOUTH 2 (TWO) TIMES DAILY BEFORE A MEAL., Disp: 180 capsule, Rfl: 1 .  ondansetron (ZOFRAN-ODT) 8 MG disintegrating tablet, , Disp: , Rfl:  .  OVER THE COUNTER MEDICATION, Apply 1 application topically at bedtime as needed. Pain Relief Balm , Disp: , Rfl:  .  polycarbophil (FIBERCON) 625 MG tablet, Take 625 mg by mouth as needed., Disp: , Rfl:  .  scopolamine (TRANSDERM-SCOP) 1 MG/3DAYS, UNW AND APP 1 PA TO SKIN PRN, Disp: , Rfl: 5 .  triamterene-hydrochlorothiazide (DYAZIDE) 37.5-25 MG capsule, Take 1 capsule by mouth daily., Disp: , Rfl: 3 .  vitamin B-12 (CYANOCOBALAMIN) 1000 MCG tablet, Take 500 mcg by mouth daily as needed. Takes 0.5 tablet , Disp: , Rfl:  .  zaleplon (SONATA) 5 MG capsule, TK 1 C PO QD HS PRN, Disp: , Rfl: 5 .  mometasone-formoterol (DULERA) 100-5 MCG/ACT AERO, Inhale 2 puffs into the lungs 2 (two) times daily. (Patient not taking: Reported on 04/25/2019), Disp: 8.8 g, Rfl: 6      Objective:   Vitals:    04/25/19 1153  BP: 120/74  Pulse: 75  Temp: (!) 97.3 F (36.3 C)  TempSrc: Temporal  SpO2: 99%  Weight: 168 lb  12.8 oz (76.6 kg)  Height: 5' 3.5" (1.613 m)    Estimated body mass index is 29.43 kg/m as calculated from the following:   Height as of this encounter: 5' 3.5" (1.613 m).   Weight as of this encounter: 168 lb 12.8 oz (76.6 kg).  @WEIGHTCHANGE @  [employer]   04/25/19 1153  Weight: 168 lb 12.8 oz (76.6 kg)     Physical Exam  General Appearance:    Alert, cooperative, no distress, appears stated age - yes , Deconditioned looking - no , OBESE  - no, Sitting on Wheelchair -  no  Head:    Normocephalic, without obvious abnormality, atraumatic  Eyes:    PERRL, conjunctiva/corneas clear,  Ears:    Normal TM's and external ear canals, both ears  Nose:   Nares normal, septum midline, mucosa normal, no drainage    or sinus tenderness. OXYGEN ON  - no . Patient is @ ra   Throat:   Lips, mucosa, and tongue normal; teeth and gums normal. Cyanosis on lips - no  Neck:   Supple, symmetrical, trachea midline, no adenopathy;    thyroid:  no enlargement/tenderness/nodules; no carotid   bruit or JVD  Back:     Symmetric, no curvature, ROM normal, no CVA tenderness  Lungs:     Distress - no , Wheeze no, Barrell Chest - no, Purse lip breathing - no, Crackles - no   Chest Wall:    No tenderness or deformity.    Heart:    Regular rate and rhythm, S1 and S2 normal, no rub   or gallop, Murmur - no  Breast Exam:    NOT DONE  Abdomen:     Soft, non-tender, bowel sounds active all four quadrants,    no masses, no organomegaly. Visceral obesity - no  Genitalia:   NOT DONE  Rectal:   NOT DONE  Extremities:   Extremities - normal, Has Cane - no, Clubbing - no, Edema - no  Pulses:   2+ and symmetric all extremities  Skin:   Stigmata of Connective Tissue Disease - no  Lymph nodes:   Cervical, supraclavicular, and axillary nodes normal  Psychiatric:  Neurologic:   Pleasant - yes, Anxious -  no, Flat affect - no  CAm-ICU - neg, Alert and Oriented x 3 - yes, Moves all 4s - yes, Speech - normal, Cognition - intact           Assessment:       ICD-10-CM   1. ILD (interstitial lung disease) (HCC)  J84.9 Pulmonary function test  2. Chronic cough  R05   3. Family history of pulmonary fibrosis  Z83.6    She has indeterminate pattern of UIP on her high-resolution CT chest.  The findings are stable for 2 years.  Her serology is negative.  Based on this the odds of her having IPF is lower although not fully excluded.  Given the strong family history IPF is possible although recent literature with family of pulmonary fibrosis shows very phenotypic expressions of pulmonary fibrosis.  It is interesting that her 2 sisters developed pulmonary fibrosis at the age of 50 patient is still 8 years away from that.  Patient is worried about both side effects of nintedanib but also more worried about risks of surgical lung biopsy or even for that matter transbronchial lung biopsy through bronchoscopy.  She prefers overall conservative approach.  We discussed expectant follow-up with serial monitoring approach which she prefers.  She prefers to  go on antifibrotic if her disease gets worse.  She understands the once her disease worsens it is irreversible.  We did discuss the possibility that generally while the disease progresses gradually sometimes he can have unpredictable and rapidly worsening course particularly if this were to be IPF.  Overall goals of care is to have serial monitoring approach.  Her most recent CT scan was in November 2020we decided to cancel that and just do a pulmonary function test in few to several months and may be do high-resolution CT chest after November 2021  Meanwhile she will keep in touch    Plan:     Patient Instructions     ICD-10-CM   1. ILD (interstitial lung disease) ([place])  J84.9   2. Chronic cough  R05   3. Family history of pulmonary fibrosis  Z83.6      Mild and stable x 2 years Probably not IPF which your sisters x 2 have Probably non-IPF stable varity provoked by radiation and genetics Still there is a < 20% chance this is IPF  Plan  - support non-biopsy approach  - supprt serial monitoring approach - cancel upcoming HRCT - do spirometry and dlco in 3-4 months - we can discuss visiting genetic counselor Roma Kayser at next visit   Followup  - 3-4 months regular clinic - do ILD symptoms score shhet and simple walk test at followup    ( Level 05 visit: estab in  visit type: on-site physical face to visit  in total care time and counseling or/and coordination of care by this undersigned MD - Dr Brand Males. This includes one or more of the following on this same day 04/25/2019: pre-charting, chart review, note writing, documentation discussion of test results, diagnostic or treatment recommendations, prognosis, risks and benefits of management options, instructions, education, compliance or risk-factor reduction. It excludes time spent by the [place] [facility] or office staff in the care of the patient. Actual time 72 min)    SIGNATURE    Dr. Brand Males, M.D., F.C.C.P,  Pulmonary and Critical Care Medicine Staff Physician, [place] Director - Interstitial Lung Disease  Program  Pulmonary [place] at [place], [place], [address]  Pager: [phone], If no answer or between  15:00h - 7:00h: call [phone] Telephone: [phone]  2:24 PM 04/25/2019

## 2019-05-02 ENCOUNTER — Other Ambulatory Visit: Payer: Medicare HMO

## 2019-05-10 ENCOUNTER — Ambulatory Visit: Payer: Medicare HMO | Admitting: Internal Medicine

## 2019-05-28 DIAGNOSIS — R69 Illness, unspecified: Secondary | ICD-10-CM | POA: Diagnosis not present

## 2019-06-25 ENCOUNTER — Encounter: Payer: Self-pay | Admitting: Oncology

## 2019-06-26 ENCOUNTER — Other Ambulatory Visit: Payer: Self-pay | Admitting: *Deleted

## 2019-06-26 DIAGNOSIS — C50919 Malignant neoplasm of unspecified site of unspecified female breast: Secondary | ICD-10-CM

## 2019-06-26 DIAGNOSIS — C50511 Malignant neoplasm of lower-outer quadrant of right female breast: Secondary | ICD-10-CM

## 2019-06-26 DIAGNOSIS — Z171 Estrogen receptor negative status [ER-]: Secondary | ICD-10-CM

## 2019-06-26 DIAGNOSIS — Z803 Family history of malignant neoplasm of breast: Secondary | ICD-10-CM

## 2019-07-15 ENCOUNTER — Other Ambulatory Visit: Payer: Self-pay | Admitting: Oncology

## 2019-08-06 ENCOUNTER — Telehealth: Payer: Self-pay | Admitting: Internal Medicine

## 2019-08-06 DIAGNOSIS — J849 Interstitial pulmonary disease, unspecified: Secondary | ICD-10-CM

## 2019-08-06 NOTE — Telephone Encounter (Signed)
Instructions    ICD-10-CM   1. ILD (interstitial lung disease) ([place])  J84.9   2. Chronic cough  R05   3. Family history of pulmonary fibrosis  Z83.6     Mild and stable x 2 years Probably not IPF which your sisters x 2 have Probably non-IPF stable varity provoked by radiation and genetics Still there is a < 20% chance this is IPF  Plan  - support non-biopsy approach  - supprt serial monitoring approach - cancel upcoming HRCT - do spirometry and dlco in 3-4 months - we can discuss visiting genetic counselor Roma Kayser at next visit      Attempted to call pt but unable to reach and unable to leave a VM. Will try to call back later.

## 2019-08-07 NOTE — Telephone Encounter (Signed)
LMTCB x2  

## 2019-08-08 NOTE — Telephone Encounter (Signed)
She only needs a high-resolution CT chest in November/December 2021 because that will be 1 year since the previous scan.  So she does not need another CT chest for this upcoming visit in the summer 2021

## 2019-08-08 NOTE — Telephone Encounter (Signed)
I cancelled her June CT and placed order for a new CT in Nov Dec 2021  Pt aware

## 2019-08-08 NOTE — Telephone Encounter (Signed)
Spoke with the pt  She is stating that there is still appt for her to have ct in her mychart  Wants to make sure okay to cancel this as stated per last avs  If so, we can just cancel and no need to call her back   Please advise thanks

## 2019-08-11 ENCOUNTER — Other Ambulatory Visit: Payer: Self-pay

## 2019-08-11 ENCOUNTER — Ambulatory Visit
Admission: RE | Admit: 2019-08-11 | Discharge: 2019-08-11 | Disposition: A | Discharge status: Home / Self Care | Payer: Medicare HMO | Source: Ambulatory Visit | Attending: Oncology | Admitting: Oncology

## 2019-08-11 DIAGNOSIS — N6310 Unspecified lump in the right breast, unspecified quadrant: Secondary | ICD-10-CM | POA: Diagnosis not present

## 2019-08-11 DIAGNOSIS — Z853 Personal history of malignant neoplasm of breast: Secondary | ICD-10-CM | POA: Diagnosis not present

## 2019-08-11 DIAGNOSIS — Z803 Family history of malignant neoplasm of breast: Secondary | ICD-10-CM

## 2019-08-11 DIAGNOSIS — C50919 Malignant neoplasm of unspecified site of unspecified female breast: Secondary | ICD-10-CM

## 2019-08-11 DIAGNOSIS — C50511 Malignant neoplasm of lower-outer quadrant of right female breast: Secondary | ICD-10-CM

## 2019-08-11 DIAGNOSIS — Z171 Estrogen receptor negative status [ER-]: Secondary | ICD-10-CM

## 2019-08-11 MED ORDER — GADOBUTROL 1 MMOL/ML IV SOLN
8.0000 mL | Freq: Once | INTRAVENOUS | Status: AC | PRN
  Administered 2019-08-11: 8 mL via INTRAVENOUS

## 2019-08-13 ENCOUNTER — Other Ambulatory Visit: Payer: Self-pay | Admitting: Oncology

## 2019-08-13 DIAGNOSIS — C50919 Malignant neoplasm of unspecified site of unspecified female breast: Secondary | ICD-10-CM

## 2019-08-13 NOTE — Progress Notes (Signed)
I called Emily Perez with the results of her MRI.  She understands we need to do an MRI guided biopsy.  She is agreeable and eager.

## 2019-08-14 ENCOUNTER — Other Ambulatory Visit: Payer: Self-pay | Admitting: Oncology

## 2019-08-14 DIAGNOSIS — C50919 Malignant neoplasm of unspecified site of unspecified female breast: Secondary | ICD-10-CM

## 2019-08-21 ENCOUNTER — Telehealth: Payer: Self-pay | Admitting: Internal Medicine

## 2019-08-21 ENCOUNTER — Other Ambulatory Visit: Payer: Medicare HMO

## 2019-08-21 NOTE — Telephone Encounter (Signed)
Attempted to call patient, unable to leave a voicemail, mailbox is full. There are no notes in the chart about outgoing calls to this patient. Uncertain who is calling her.

## 2019-08-22 ENCOUNTER — Other Ambulatory Visit (HOSPITAL_COMMUNITY)
Admission: RE | Admit: 2019-08-22 | Discharge: 2019-08-22 | Disposition: A | Discharge status: Home / Self Care | Payer: Medicare HMO | Source: Ambulatory Visit | Attending: Internal Medicine | Admitting: Internal Medicine

## 2019-08-22 DIAGNOSIS — Z01812 Encounter for preprocedural laboratory examination: Secondary | ICD-10-CM | POA: Diagnosis not present

## 2019-08-22 DIAGNOSIS — Z20822 Contact with and (suspected) exposure to covid-19: Secondary | ICD-10-CM | POA: Insufficient documentation

## 2019-08-22 LAB — SARS CORONAVIRUS 2 (TAT 6-24 HRS): SARS Coronavirus 2: NEGATIVE

## 2019-08-24 ENCOUNTER — Other Ambulatory Visit: Payer: Self-pay

## 2019-08-24 ENCOUNTER — Ambulatory Visit (INDEPENDENT_AMBULATORY_CARE_PROVIDER_SITE_OTHER): Payer: Medicare HMO | Admitting: Pulmonary Disease

## 2019-08-24 DIAGNOSIS — J849 Interstitial pulmonary disease, unspecified: Secondary | ICD-10-CM | POA: Diagnosis not present

## 2019-08-24 LAB — PULMONARY FUNCTION TEST
DL/VA % pred: 110 %
DL/VA: 4.65 ml/min/mmHg/L
DLCO unc % pred: 79 %
DLCO unc: 15.5 ml/min/mmHg
FEF 25-75 Post: 1.66 L/sec
FEF 25-75 Pre: 1.88 L/sec
FEF2575-%Change-Post: -11 %
FEF2575-%Pred-Post: 90 %
FEF2575-%Pred-Pre: 101 %
FEV1-%Change-Post: -3 %
FEV1-%Pred-Post: 83 %
FEV1-%Pred-Pre: 85 %
FEV1-Post: 1.6 L
FEV1-Pre: 1.65 L
FEV1FVC-%Change-Post: 4 %
FEV1FVC-%Pred-Pre: 104 %
FEV6-%Change-Post: -7 %
FEV6-%Pred-Post: 77 %
FEV6-%Pred-Pre: 84 %
FEV6-Post: 1.85 L
FEV6-Pre: 2 L
FEV6FVC-%Change-Post: 0 %
FEV6FVC-%Pred-Post: 103 %
FEV6FVC-%Pred-Pre: 103 %
FVC-%Change-Post: -7 %
FVC-%Pred-Post: 75 %
FVC-%Pred-Pre: 81 %
FVC-Post: 1.86 L
FVC-Pre: 2.01 L
Post FEV1/FVC ratio: 86 %
Post FEV6/FVC ratio: 99 %
Pre FEV1/FVC ratio: 82 %
Pre FEV6/FVC Ratio: 100 %

## 2019-08-24 NOTE — Progress Notes (Signed)
PFT completed today.  

## 2019-08-28 ENCOUNTER — Ambulatory Visit
Admission: RE | Admit: 2019-08-28 | Discharge: 2019-08-28 | Disposition: A | Discharge status: Home / Self Care | Payer: Medicare HMO | Source: Ambulatory Visit | Attending: Oncology | Admitting: Oncology

## 2019-08-28 ENCOUNTER — Other Ambulatory Visit: Payer: Self-pay | Admitting: Oncology

## 2019-08-28 ENCOUNTER — Ambulatory Visit: Payer: Medicare HMO

## 2019-08-28 ENCOUNTER — Other Ambulatory Visit: Payer: Self-pay

## 2019-08-28 ENCOUNTER — Encounter: Payer: Self-pay | Admitting: Internal Medicine

## 2019-08-28 ENCOUNTER — Ambulatory Visit (INDEPENDENT_AMBULATORY_CARE_PROVIDER_SITE_OTHER): Payer: Medicare HMO | Admitting: Internal Medicine

## 2019-08-28 VITALS — BP 122/74 | HR 78 | Temp 97.9°F | Ht 63.5 in | Wt 166.4 lb

## 2019-08-28 DIAGNOSIS — J849 Interstitial pulmonary disease, unspecified: Secondary | ICD-10-CM

## 2019-08-28 DIAGNOSIS — Z836 Family history of other diseases of the respiratory system: Secondary | ICD-10-CM | POA: Diagnosis not present

## 2019-08-28 DIAGNOSIS — Z853 Personal history of malignant neoplasm of breast: Secondary | ICD-10-CM | POA: Diagnosis not present

## 2019-08-28 DIAGNOSIS — R05 Cough: Secondary | ICD-10-CM | POA: Diagnosis not present

## 2019-08-28 DIAGNOSIS — C50919 Malignant neoplasm of unspecified site of unspecified female breast: Secondary | ICD-10-CM

## 2019-08-28 DIAGNOSIS — J392 Other diseases of pharynx: Secondary | ICD-10-CM

## 2019-08-28 DIAGNOSIS — R053 Chronic cough: Secondary | ICD-10-CM

## 2019-08-28 LAB — CBC WITH DIFFERENTIAL/PLATELET
Basophils Absolute: 0 10*3/uL (ref 0.0–0.1)
Basophils Relative: 0.8 % (ref 0.0–3.0)
Eosinophils Absolute: 0.1 10*3/uL (ref 0.0–0.7)
Eosinophils Relative: 2.1 % (ref 0.0–5.0)
HCT: 35.1 % — ABNORMAL LOW (ref 36.0–46.0)
Hemoglobin: 11.1 g/dL — ABNORMAL LOW (ref 12.0–15.0)
Lymphocytes Relative: 22.6 % (ref 12.0–46.0)
Lymphs Abs: 1.4 10*3/uL (ref 0.7–4.0)
MCHC: 31.6 g/dL (ref 30.0–36.0)
MCV: 80.1 fl (ref 78.0–100.0)
Monocytes Absolute: 0.7 10*3/uL (ref 0.1–1.0)
Monocytes Relative: 11.4 % (ref 3.0–12.0)
Neutro Abs: 4 10*3/uL (ref 1.4–7.7)
Neutrophils Relative %: 63.1 % (ref 43.0–77.0)
Platelets: 216 10*3/uL (ref 150.0–400.0)
RBC: 4.38 Mil/uL (ref 3.87–5.11)
RDW: 15.2 % (ref 11.5–15.5)
WBC: 6.3 10*3/uL (ref 4.0–10.5)

## 2019-08-28 MED ORDER — GADOBUTROL 1 MMOL/ML IV SOLN
8.0000 mL | Freq: Once | INTRAVENOUS | Status: AC | PRN
  Administered 2019-08-28: 8 mL via INTRAVENOUS

## 2019-08-28 NOTE — Patient Instructions (Addendum)
ILD (interstitial lung disease) ([place]) Family history of pulmonary fibrosis Chronic cough Hyperactive pharyngeal gag reflex    Interstitial lung disease is stable based on subjective symptoms and on breathing test You seem to be having an abnormal cough reflex with gagging that is likely neuro  Plan  -We took a shared decision making supporting her overall conservative and minimally pharmaceutical/nonpharmaceutical approach to your care.  Based on this   -Hold of surgical lung biopsy but continue serial monitoring  -Repeat spirometry and DLCO in 6 months   -Consider referral to Ms. Roma Kayser genetic counselor -due to family history of pulmonary fibrosis   -Because of your cough    -do exhaled nitric oxide testing also in 6 months   -do blood CBC with differential and blood IgE today   -take 2 days of voice rest without any whispering or talking   -Drink water or suck on sugarless lozenge anytime you have the urge to cough or gag   -Refer Mr. Call Schinke -neuro rehabilitation  Follow-up -6 months do spirometry and DLCO and return to see Dr. Chase Caller 30-minute slot  -ILD symptom questionnaire and simple walk test at follow-up

## 2019-08-28 NOTE — Progress Notes (Signed)
OV 04/25/2019 -ILD center visit.  Referred by Dr. Rodman Pickle  Subjective:  Patient ID: Emily Perez, female , DOB: 14-Aug-1955 , age 64 y.o. , MRN: [id number] , [address] [address]   04/25/2019 -   Chief Complaint  Patient presents with  . Consult    Referred by Dr. Loanne Drilling for possible ILD. Patient reports dry cough. She denies sob.      HPI Emily Perez 64 y.o. -    [facility] Comprehensive ILD Questionnaire  Symptoms: *Insidious onset of cough that started in March 2020 following radiation chemotherapy in 2019 for breast cancer on the right side.  Since it started the cough is the same.  Is moderate but occasionally severe.  She does cough at night particularly the cough is worse towards the end of the day but not necessarily wakes her up at night.  Cough is worse with brushing of the teeth and also increase mobility.  In the past she is coughed up an occasional blood.  The cough is not worse when she lies down does not affect her voice.  She does clear her throat sometimes.  There is a tickle in the throat.  She does not have any dyspnea except extremely mild dyspnea when she climbs stairs.  There is no wheezing.  SYMPTOM SCALE - ILD 04/25/2019   O2 use ra  Shortness of Breath 0 -> 5 scale with 5 being worst (score 6 If unable to do)  At rest 0  Simple tasks - showers, clothes change, eating, shaving 0  Household (dishes, doing bed, laundry) 0  Shopping 0  Walking level at own pace 0  Walking up Stairs 2  Total (30-36) Dyspnea Score 2  How bad is your cough? 2.5  How bad is your fatigue yes  How bad is nausea x  How bad is vomiting?  x  How bad is diarrhea? x  How bad is anxiety? x  How bad is depression x       Past Medical History : Denies any asthma, COPD, heart failure, rheumatoid arthritis, scleroderma, lupus, polymyositis.  Denies Sjogren's or hiatal hernia acid reflux.  Denies any sleep apnea denies any HIV.   Denies pulmonary hypertension denies diabetes denies thyroid disease denies stroke.  Denies seizures denies mononucleosis or hepatitis.  Denies kidney disease.  Denies pneumonia.  Denies blood clots denies heart disease denies pleurisy.  She has hypertension and orthopedic problems with a phone meniscus.   ROS: Positive for fatigue, arthralgia, pain in her fingers due to neuropathy contractures but no [place].  She has lost 36 pounds of weight because of breast cancer treatment in the last 2 years.  She is known to have chronic dermatitis.  Otherwise no nausea vomiting or dysphagia   FAMILY HISTORY of LUNG DISEASE: Strongly positive for pulmonary fibrosis.  She believes both sisters have idiopathic pulmonary fibrosis.  She has a sister currently 87 years of age who has had IPF since she was approximately 35.  This is just on nintedanib.  Analysis is in [place] who is 66 years of age and has been diagnosed with IPF for the last few months the sister is also on nintedanib.  Based on their experience of nintedanib patient is reluctant to do nintedanib.  The sisters also have fibromyalgia.   EXPOSURE HISTORY:-There were kids but dad smoked.  Otherwise no smoking exposure.  No marijuana use currently.  In the past she did  smoke marijuana 1980s occasionally.  Never use cocaine abuse intravenous drug use.   HOME and HOBBY DETAILS : [place] several months.  Age of the home is 32 years.  Strongly denies all the mold and mildew questions and any organic antigen exposure questions in our history   OCCUPATIONAL HISTORY (122 questions) : Positive for possible asbestos exposure when she was at [facility] and working that there is some renovation going on for 2 years.  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  In this renovation at [facility] she did work in a dusty environment.   They were removing asbestos from an old building.   PULMONARY TOXICITY HISTORY (27 items): In August 2019 she underwent right breast radiation for 32 days.  Before that that she got chemo and radiation    Testing history as below  Results for MAYSON, STERBENZ (MRN [id number]) as of 04/25/2019 12:06  Ref. Range 02/19/2019 09:40  FVC-Pre Latest Units: L 1.99  FVC-%Pred-Pre Latest Units: % 80  FEV1-Pre Latest Units: L 1.61  FEV1-%Pred-Pre Latest Units: % 83  Pre FEV1/FVC ratio Latest Units: % 81  FEV1FVC-%Pred-Pre Latest Units: % 103  Results for DALMA, PANCHAL (MRN [id number]) as of 04/25/2019 12:06  Ref. Range 02/19/2019 09:40  DLCO unc Latest Units: ml/min/mmHg 11.12  DLCO unc % pred Latest Units: % 57    Results for MARGEE, TRENTHAM (MRN [id number]) as of 04/25/2019 12:06  Ref. Range 02/20/2019 11:11  Anti Nuclear Antibody (ANA) Latest Ref Range: NEGATIVE  NEGATIVE  Cyclic Citrullin Peptide Ab Latest Units: UNITS <16  [employer] Latest Ref Range: NOT DETECT  NOT DETECTED  RA Latex Turbid. Latest Ref Range: <14 IU/mL <14    HRCT NOV 2020 -personally visualized and personally interpreted the result and agree with the formal report from the radiologist.  IMPRESSION: 1. New bandlike radiation fibrosis in the peripheral mid to upper right lung. 2. Chronic mild patchy subpleural reticulation and ground-glass attenuation throughout both lungs without bronchiectasis or honeycombing, unchanged since 05/18/2017 chest CT. Findings may represent a mild nonprogressive underlying interstitial lung disease such as nonspecific interstitial pneumonia (NSIP). Usual interstitial pneumonia (UIP) is considered less likely but not entirely excluded. Findings are indeterminate for UIP per consensus guidelines: Diagnosis of Idiopathic Pulmonary Fibrosis: An Official ATS/ERS/JRS/ALAT Clinical Practice Guideline. [place], Iss 5, [phone], Nov 20 2016. 3. One vessel  coronary atherosclerosis. 4. No thoracic adenopathy or other findings of metastatic disease in the chest.  Aortic Atherosclerosis (ICD10-I70.0).   Electronically Signed   By: Ilona Sorrel M.D.   On: 01/30/2019 15:13   ROS - per HPI   ASSESSMENT/PLAN   Mild and stable x 2 years Probably not IPF which your sisters x 2 have Probably non-IPF stable varity provoked by radiation and genetics Still there is a  chance this is IPF  Plan  - support non-biopsy approach  - supprt serial monitoring approach - cancel upcoming HRCT - do spirometry and dlco in 3-4 months - we can discuss visiting genetic counselor Roma Kayser at next visit      OV 08/28/2019  Subjective:  Patient ID: Emily Perez, female , DOB: 03-25-1955 , age 20 y.o. , MRN: [id number] , [address] [place] [address]   08/28/2019 -  Chief Complaint  Patient presents with  . Follow-up    Pt states she has been doing okay since last visit and denies any complaints.   Follow-up interstitial lung disease [indeterminate for UIP pattern with family history of pulmonary fibrosis and also breast cancer radiation] -preferred supportive care as a goals of care  HPI Emily Perez 64 y.o. -returns for follow-up.  For interstitial lung disease.  Overall she is stable compared to when I saw her in February 2021.  She is not reporting any worsening symptoms although in the symptom score maybe her dyspnea is worse but she denies this subjectively.  She says the main issue is her cough.  She has no cough in the summer but in the winter and cold air cough gets worse.  She says despite all this she has a constant presence of a gag particularly when she is brushing the teeth and she has to clear her throat and cough quite violently.  There is no wheezing or dyspnea during this episode.  This been going on for over a year it is new onset.  She thinks it might be related to mint toothpaste but then she has been  using the mint toothpaste for many years.  She is frustrated by this.  Overall she prefers a nonpharmaceutical approach.  She is not wanting to undergo lung biopsy.  We discussed antifibrotic's and she is reluctant to take them upfront even if it means that her underlying lung disease might be UIP.  She prefers a wait and watch approach even if it means loss of lung function.  She says she might consider antifibrotic's if there is progression of interstitial lung disease.  At this point in time cough control and gag control is the main concern she has.  She is also willing to see Ms. Roma Kayser genetics counselor for family history of pulmonary fibrosis.   SYMPTOM SCALE - ILD 04/25/2019    O2 use ra ra  Shortness of Breath 0 -> 5 scale with 5 being worst (score 6 If unable to do)   At rest 0 0  Simple tasks - showers, clothes change, eating, shaving 0 1  Household (dishes, doing bed, laundry) 0 1  Shopping 0 0  Walking level at own pace 0 1  Walking up Stairs 2 2  Total (30-36) Dyspnea Score 2 4  How bad is your cough? 2.5 0 cough insummer. 1 and more severe on cold weather.Jeryl [place] when brushing teeth  How bad is your fatigue yes 2  How bad is nausea x 0  How bad is vomiting?  x 0  How bad is diarrhea? x 0  How bad is anxiety? x 2  How bad is depression x 0     Results for KAITY, PITSTICK (MRN [id number]) as of 08/28/2019 12:01  Ref. Range 02/19/2019 09:40 08/24/2019 13:39  FVC-Pre Latest Units: L 1.99 2.01  FVC-%Pred-Pre Latest Units: % 80 81    Results for CHARLENE, DETTER (MRN [id number]) as of 08/28/2019 12:01  Ref. Range 02/19/2019 09:40 08/24/2019 13:39  DLCO unc Latest Units: ml/min/mmHg 11.12 15.50  DLCO unc % pred Latest Units: % 57 79     ROS - per HPI     has a past medical history of Allergic rhinitis, Arthritis, Breast cancer ([place]), Family history of breast cancer, Family history of prostate cancer, Headache, Hyperlipidemia, Hypertension, and PONV  (postoperative nausea and vomiting).   reports that she has never smoked. She has  never used smokeless tobacco.  Past Surgical History:  Procedure Laterality Date  . BREAST LUMPECTOMY WITH RADIOACTIVE SEED AND SENTINEL LYMPH NODE BIOPSY Right 08/30/2017   Procedure: RIGHT BREAST RADIOACTIVE SEED GUIDED LUMPECTOMY WITH RIGHT RADIOACTIVE SEED TARGETED AXILLARY LYMPH NODE EXCISION AND RIGHT SENTINEL LYMPH NODE BIOPSY;  Surgeon: Rolm Bookbinder, MD;  Location: [facility];  Service: General;  Laterality: Right;  . COLONOSCOPY    . FOOT SURGERY Bilateral   . FRACTURE SURGERY Left    wrist  . PORTACATH PLACEMENT Right 05/19/2017   Procedure: INSERTION PORT-A-CATH WITH ULTRASOUND;  Surgeon: Rolm Bookbinder, MD;  Location: [facility];  Service: General;  Laterality: Right;  . TUBAL LIGATION    . WISDOM TOOTH EXTRACTION      Allergies  Allergen Reactions  . Latex Hives and Rash    Immunization History  Administered Date(s) Administered  . Moderna SARS-COVID-2 Vaccination 07/15/2019, 08/13/2019  . Zoster Recombinat (Shingrix) 01/10/2018, 01/30/2019    Family History  Problem Relation Age of Onset  . Heart disease Mother   . Cancer Father   . Diabetes Father   . Hypertension Father   . Hyperlipidemia Father   . Emphysema Father   . Heart disease Brother   . Hypertension Brother   . Hyperlipidemia Brother   . Diabetes Brother   . Prostate cancer Brother 53  . Breast cancer Sister 57  . Breast cancer Sister 53  . Asthma Sister   . Cancer Paternal Aunt        unsure what type of cancer she had  . Prostate cancer Paternal Uncle   . Diabetes Brother   . Gout Brother   . Kidney disease Brother   . Lung disease Sister   . Breast cancer Sister      Current Outpatient Medications:  .  aspirin 81 MG tablet, Take 81 mg by mouth 3 (three) times a week. , Disp: , Rfl:  .  doxylamine, Sleep, (UNISOM) 25 MG tablet, Take 50 mg by mouth at bedtime as needed., Disp: , Rfl:  .  meclizine  (ANTIVERT) 12.5 MG tablet, , Disp: , Rfl:  .  Multiple Vitamins-Minerals (MULTIVITAMIN WITH MINERALS) tablet, Take 1 tablet by mouth daily., Disp: , Rfl:  .  naproxen sodium (ANAPROX) 550 MG tablet, Take 550 mg by mouth daily as needed (pain). Takes 0.5-1 tablet as needed for pain , Disp: , Rfl:  .  ondansetron (ZOFRAN-ODT) 8 MG disintegrating tablet, , Disp: , Rfl:  .  OVER THE COUNTER MEDICATION, Apply 1 application topically at bedtime as needed. Pain Relief Balm , Disp: , Rfl:  .  polycarbophil (FIBERCON) 625 MG tablet, Take 625 mg by mouth as needed., Disp: , Rfl:  .  triamterene-hydrochlorothiazide (DYAZIDE) 37.5-25 MG capsule, Take 1 capsule by mouth daily., Disp: , Rfl: 3 .  vitamin B-12 (CYANOCOBALAMIN) 1000 MCG tablet, Take 500 mcg by mouth daily as needed. Takes 0.5 tablet , Disp: , Rfl:  .  zaleplon (SONATA) 5 MG capsule, TK 1 C PO QD HS PRN, Disp: , Rfl: 5 .  mometasone-formoterol (DULERA) 100-5 MCG/ACT AERO, Inhale 2 puffs into the lungs 2 (two) times daily. (Patient not taking: Reported on 04/25/2019), Disp: 8.8 g, Rfl: 6      Objective:   Vitals:   08/28/19 1141  BP: 122/74  Pulse: 78  Temp: 97.9 F (36.6 C)  TempSrc: Oral  SpO2: 98%  Weight: 166 lb 6.4 oz (75.5 kg)  Height: 5' 3.5" (1.613 m)  Estimated body mass index is 29.01 kg/m as calculated from the following:   Height as of this encounter: 5' 3.5" (1.613 m).   Weight as of this encounter: 166 lb 6.4 oz (75.5 kg).  @WEIGHTCHANGE @  [employer]   08/28/19 1141  Weight: 166 lb 6.4 oz (75.5 kg)     Physical Exam Pleasant female with normal oral cavity.  No neck nodes no elevated JVP.  Nearly clear to auscultation bilaterally.  No clubbing no stigmata of connective tissue disease no cyanosis no clubbing no edema.       Assessment:       ICD-10-CM   1. ILD (interstitial lung disease) ([place])  J84.9 CBC w/Diff    IgE    Ambulatory Referral to Neuro Rehab    Ambulatory referral to Genetics     Pulmonary function test    Nitric oxide  2. Family history of pulmonary fibrosis  Z83.6 Ambulatory referral to Genetics  3. Chronic cough  R05 CBC w/Diff    IgE  4. Hyperactive pharyngeal gag reflex  J39.2        Plan:     Patient Instructions  ILD (interstitial lung disease) ([place]) Family history of pulmonary fibrosis Chronic cough Hyperactive pharyngeal gag reflex    Interstitial lung disease is stable based on subjective symptoms and on breathing test You seem to be having an abnormal cough reflex with gagging that is likely neuro  Plan  -We took a shared decision making supporting her overall conservative and minimally pharmaceutical/nonpharmaceutical approach to your care.  Based on this   -Hold of surgical lung biopsy but continue serial monitoring  -Repeat spirometry and DLCO in 6 months   -Consider referral to Ms. Roma Kayser genetic counselor -due to family history of pulmonary fibrosis   -Because of your cough    -do exhaled nitric oxide testing also in 6 months   -do blood CBC with differential and blood IgE today   -take 2 days of voice rest without any whispering or talking   -Drink water or suck on sugarless lozenge anytime you have the urge to cough or gag   -Refer Mr. Call Schinke -neuro rehabilitation  Follow-up -6 months do spirometry and DLCO and return to see Dr. Chase Caller 30-minute slot  -ILD symptom questionnaire and simple walk test at follow-up  (Level 04: Estb 30-39 min  visit type: on-site physical face to visit visit spent in total care time and counseling or/and coordination of care by this undersigned MD - Dr Brand Males. This includes one or more of the following on this same day 08/28/2019: pre-charting, chart review, note writing, documentation discussion of test results, diagnostic or treatment recommendations, prognosis, risks and benefits of management options, instructions, education, compliance or risk-factor reduction. It excludes time  spent by the [facility] or office staff in the care of the patient . Actual time is 30 min)    SIGNATURE    Dr. Brand Males, M.D., F.C.C.P,  Pulmonary and Critical Care Medicine Staff Physician, [place] Director - Interstitial Lung Disease  Program  Pulmonary [place] at [place], [place], [address]  Pager: [phone], If no answer or between  15:00h - 7:00h: call [phone] Telephone: [phone]  12:27 PM 08/28/2019

## 2019-08-29 LAB — IGE: IgE (Immunoglobulin E), Serum: 6 kU/L (ref <=114)

## 2019-08-30 ENCOUNTER — Telehealth: Payer: Self-pay | Admitting: Oncology

## 2019-08-30 NOTE — Telephone Encounter (Signed)
Scheduled appt per 6/09 sch message - unable to reach pt . Left message and sent letter in the mail.

## 2019-10-01 ENCOUNTER — Inpatient Hospital Stay: Payer: Medicare HMO | Admitting: Genetic Counselor

## 2019-10-01 ENCOUNTER — Inpatient Hospital Stay: Payer: Medicare HMO

## 2019-10-10 DIAGNOSIS — M6740 Ganglion, unspecified site: Secondary | ICD-10-CM | POA: Diagnosis not present

## 2019-10-10 DIAGNOSIS — M5412 Radiculopathy, cervical region: Secondary | ICD-10-CM | POA: Diagnosis not present

## 2019-11-05 NOTE — Progress Notes (Signed)
My apologoes for delay. Blood work from  June 2021 is normal. Ensure dec 2021 followup per June 2021 office viist instructions. Thanks, MR

## 2019-12-05 DIAGNOSIS — Z853 Personal history of malignant neoplasm of breast: Secondary | ICD-10-CM | POA: Diagnosis not present

## 2019-12-05 DIAGNOSIS — R42 Dizziness and giddiness: Secondary | ICD-10-CM | POA: Diagnosis not present

## 2019-12-05 DIAGNOSIS — D569 Thalassemia, unspecified: Secondary | ICD-10-CM | POA: Diagnosis not present

## 2019-12-05 DIAGNOSIS — I10 Essential (primary) hypertension: Secondary | ICD-10-CM | POA: Diagnosis not present

## 2019-12-05 DIAGNOSIS — E7800 Pure hypercholesterolemia, unspecified: Secondary | ICD-10-CM | POA: Diagnosis not present

## 2019-12-05 DIAGNOSIS — R69 Illness, unspecified: Secondary | ICD-10-CM | POA: Diagnosis not present

## 2019-12-05 DIAGNOSIS — Z0000 Encounter for general adult medical examination without abnormal findings: Secondary | ICD-10-CM | POA: Diagnosis not present

## 2020-01-24 DIAGNOSIS — C50119 Malignant neoplasm of central portion of unspecified female breast: Secondary | ICD-10-CM | POA: Diagnosis not present

## 2020-02-06 ENCOUNTER — Telehealth: Payer: Self-pay | Admitting: Internal Medicine

## 2020-02-06 NOTE — Telephone Encounter (Signed)
Error- pt was able to be scheduled

## 2020-02-08 DIAGNOSIS — Z853 Personal history of malignant neoplasm of breast: Secondary | ICD-10-CM | POA: Diagnosis not present

## 2020-02-08 DIAGNOSIS — R928 Other abnormal and inconclusive findings on diagnostic imaging of breast: Secondary | ICD-10-CM | POA: Diagnosis not present

## 2020-02-19 DIAGNOSIS — Z853 Personal history of malignant neoplasm of breast: Secondary | ICD-10-CM | POA: Diagnosis not present

## 2020-02-19 DIAGNOSIS — Z13820 Encounter for screening for osteoporosis: Secondary | ICD-10-CM | POA: Diagnosis not present

## 2020-02-19 DIAGNOSIS — R109 Unspecified abdominal pain: Secondary | ICD-10-CM | POA: Diagnosis not present

## 2020-02-19 DIAGNOSIS — Z9189 Other specified personal risk factors, not elsewhere classified: Secondary | ICD-10-CM | POA: Diagnosis not present

## 2020-02-22 ENCOUNTER — Ambulatory Visit (INDEPENDENT_AMBULATORY_CARE_PROVIDER_SITE_OTHER)
Admission: RE | Admit: 2020-02-22 | Discharge: 2020-02-22 | Disposition: A | Discharge status: Home / Self Care | Payer: Medicare HMO | Source: Ambulatory Visit | Attending: Internal Medicine | Admitting: Internal Medicine

## 2020-02-22 ENCOUNTER — Other Ambulatory Visit: Payer: Self-pay

## 2020-02-22 DIAGNOSIS — J849 Interstitial pulmonary disease, unspecified: Secondary | ICD-10-CM | POA: Diagnosis not present

## 2020-02-22 DIAGNOSIS — R059 Cough, unspecified: Secondary | ICD-10-CM | POA: Diagnosis not present

## 2020-02-24 NOTE — Progress Notes (Signed)
Emily Perez  [phone] [phone]     ID: Emily Perez DOB: [birth year]  MR#: [id number]  TMH#:[id number]  Patient Care Team: Shirline Frees, MD as PCP - General (Family Medicine) Franceen Erisman, Emily Dad, MD as Consulting Physician (Oncology) Rolm Bookbinder, MD as Consulting Physician (General Surgery) Gery Pray, MD as Consulting Physician (Radiation Oncology) Allyn Kenner, MD (Dermatology) Thurnell Lose, MD as Consulting Physician (Obstetrics and Gynecology) Marchia Bond, MD as Consulting Physician (Orthopedic Surgery) Christene Slates, MD as Physician Assistant (Radiology) Margaretha Seeds, MD as Consulting Physician (Pulmonary Disease) OTHER MD:   CHIEF COMPLAINT: Triple negative breast cancer; PALB2 mutation  CURRENT TREATMENT: anastrozole; intensified screening   INTERVAL HISTORY: Emily Perez returns today for follow up of her triple negative breast cancer.  She is also under intensified screening for her PALB mutation  She was restarted on anastrozole at her last visit on 02/22/2019.  She tolerated that only briefly.  She started losing her hair and became depressed.  She does not want to try aromatase inhibitors again.  Her most recent bone density screening from 04/13/2018 showed a T-score of -2.5, which is considered osteoporotic.   Since her last visit, she underwent breast MRI on 08/11/2019 showing: breast composition B; new 7 mm slightly irregular mass within central right breast in region of prior lumpectomy; no abnormal-appearing lymph nodes; no evidence of left breast malignancy.  She was scheduled for biopsy of the right breast area on 08/28/2019, but MRI performed at that time no longer showed the area of abnormal enhancement.  She also underwent high resolution chest CT on 02/22/2020 showing: post-radiation fibrosis in portions of right lung; no compelling findings to suggest interstitial lung disease; evidence of small airways  disease.   REVIEW OF SYSTEMS: Emily Perez is very anxious about her children and grandchildren.  For exercise she walks regularly.  She does not have unusual headaches visual changes gait imbalance or falls.  She does have neuropathy worse in her left hand than elsewhere.  Detailed review of systems was otherwise stable  COVID 19 VACCINATION STATUS: Status post Modernax2, with booster November 2021   HISTORY OF CURRENT ILLNESS: From the original intake note:  Emily Perez had routine bilateral screening mammography at [facility] on 04/19/2017 showing a possible abnormality in the right breast. She underwent unilateral right diagnostic mammography with tomography and right breast ultrasonography at [facility] on 04/25/2017 showing: breast density category B.  In the right breast at the 6:00 radiant 2 cm from the nipple there was a 2 cm oval mass which by ultrasound measured 2.5 cm and was irregular and hypoechoic.  There were also at least 3 abnormal appearing lymph nodes in the right axilla.  Accordingly on 05/02/2017 she proceeded to biopsy of the right breast mass in question and lymph node sampling. The pathology from this procedure showed ([id number]): Invasive ductal carcinoma grade II. One lymph node positive for metastatic carcinoma (1/1). Prognostic indicators significant for: estrogen receptor, 0% negative and progesterone receptor, 0% negative. Proliferation marker Ki67 at 80%. HER2 not amplified with ratios HER2/CEP17 signals 1.29 and average copies per cell 2.25  The patient's subsequent history is as detailed below.   PAST MEDICAL HISTORY: Past Medical History:  Diagnosis Date  . Allergic rhinitis   . Arthritis   . Breast cancer ([place])   . Family history of breast cancer   . Family history of prostate cancer   . Headache    migraines in the past  . Hyperlipidemia   .  Hypertension   . PONV (postoperative nausea and vomiting)    after 1st colonoscopy  Migraines in the past.    PAST  SURGICAL HISTORY: Past Surgical History:  Procedure Laterality Date  . BREAST LUMPECTOMY WITH RADIOACTIVE SEED AND SENTINEL LYMPH NODE BIOPSY Right 08/30/2017   Procedure: RIGHT BREAST RADIOACTIVE SEED GUIDED LUMPECTOMY WITH RIGHT RADIOACTIVE SEED TARGETED AXILLARY LYMPH NODE EXCISION AND RIGHT SENTINEL LYMPH NODE BIOPSY;  Surgeon: Rolm Bookbinder, MD;  Location: [place];  Service: General;  Laterality: Right;  . COLONOSCOPY    . FOOT SURGERY Bilateral   . FRACTURE SURGERY Left    wrist  . PORTACATH PLACEMENT Right 05/19/2017   Procedure: INSERTION PORT-A-CATH WITH ULTRASOUND;  Surgeon: Rolm Bookbinder, MD;  Location: [place];  Service: General;  Laterality: Right;  . TUBAL LIGATION    . WISDOM TOOTH EXTRACTION     Wrist Fracture. Torn Rotator cuff and torn meniscus.    FAMILY HISTORY Family History  Problem Relation Age of Onset  . Heart disease Mother   . Cancer Father   . Diabetes Father   . Hypertension Father   . Hyperlipidemia Father   . Emphysema Father   . Heart disease Brother   . Hypertension Brother   . Hyperlipidemia Brother   . Diabetes Brother   . Prostate cancer Brother 47  . Breast cancer Sister 55  . Breast cancer Sister 28  . Asthma Sister   . Cancer Paternal Aunt        unsure what type of cancer she had  . Prostate cancer Paternal Uncle   . Diabetes Brother   . Gout Brother   . Kidney disease Brother   . Lung disease Sister   . Breast cancer Sister   The patient's father died at age 41 due to heart disease and emphysema. The patient's mother died at age 79 due to heart disease. The patient has 6 brothers and 8 sisters. She notes 2 sisters with breast cancer. The 1st sister was diagnosed at age 48, and the 2nd sister was diagnosed at age 69. She notes that both sisters are alive. She also notes a paternal first cousin with breast cancer diagnosed in her 59's. The patient notes that her father also had bone cancer, but she's unsure if he had myeloma. She  denies a family history of ovarian cancer.    GYNECOLOGIC HISTORY:  No LMP recorded. Patient is postmenopausal. 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 She is divorced. She lives by herself with no pets. The patient's daughter, Emily Perez, works in Therapist, art for [employer]. The patient's son, Emily Perez,  works for [employer]. The patient has  2 grandchildren. She belongs to [facility].     ADVANCED DIRECTIVES: Not in place.  At the 05/19/2017 visit the patient was given the appropriate documents to complete and notarized at her discretion   HEALTH MAINTENANCE: Social History   Tobacco Use  . Smoking status: Never Smoker  . Smokeless tobacco: Never Used  Vaping Use  . Vaping Use: Never used  Substance Use Topics  . Alcohol use: No  . Drug use: No     Colonoscopy:   PAP: September 2018 normal  Bone density: none   Allergies  Allergen Reactions  . Latex Hives and Rash  Current Outpatient Medications  Medication Sig Dispense Refill  . aspirin 81 MG tablet Take 81 mg by mouth 3 (three) times a week.     Marland Kitchen doxylamine, Sleep, (UNISOM) 25 MG tablet Take 50 mg by mouth at bedtime as needed.    . meclizine (ANTIVERT) 12.5 MG tablet     . mometasone-formoterol (DULERA) 100-5 MCG/ACT AERO Inhale 2 puffs into the lungs 2 (two) times daily. (Patient not taking: Reported on 04/25/2019) 8.8 g 6  . Multiple Vitamins-Minerals (MULTIVITAMIN WITH MINERALS) tablet Take 1 tablet by mouth daily.    . naproxen sodium (ANAPROX) 550 MG tablet Take 550 mg by mouth daily as needed (pain). Takes 0.5-1 tablet as needed for pain     . ondansetron (ZOFRAN-ODT) 8 MG disintegrating tablet     . OVER THE COUNTER MEDICATION Apply 1 application topically at bedtime as needed. Pain Relief Balm     .  polycarbophil (FIBERCON) 625 MG tablet Take 625 mg by mouth as needed.    . triamterene-hydrochlorothiazide (DYAZIDE) 37.5-25 MG capsule Take 1 capsule by mouth daily.  3  . vitamin B-12 (CYANOCOBALAMIN) 1000 MCG tablet Take 500 mcg by mouth daily as needed. Takes 0.5 tablet     . zaleplon (SONATA) 5 MG capsule TK 1 C PO QD HS PRN  5   No current facility-administered medications for this visit.   Facility-Administered Medications Ordered in Other Visits  Medication Dose Route Frequency Provider Last Rate Last Admin  . heparin lock flush 100 unit/mL  500 Units Intravenous Once Emily Perez, Emily Dad, MD      . sodium chloride flush (NS) 0.9 % injection 10 mL  10 mL Intravenous PRN Roni Friberg, Emily Dad, MD        OBJECTIVE: African-American woman who appears stated age  Vitals:   02/25/20 1325  BP: 130/76  Pulse: 78  Resp: 18  Temp: 97.8 F (36.6 C)  SpO2: 100%     Body mass index is 29.73 kg/m.   Wt Readings from Last 3 Encounters:  02/25/20 170 lb 8 oz (77.3 kg)  08/28/19 166 lb 6.4 oz (75.5 kg)  04/25/19 168 lb 12.8 oz (76.6 kg)   ECOG FS:1 - Symptomatic but completely ambulatory   Sclerae unicteric, EOMs intact Wearing a mask No cervical or supraclavicular adenopathy Lungs no rales or rhonchi Heart regular rate and rhythm Abd soft, nontender, positive bowel sounds MSK no focal spinal tenderness, no upper extremity lymphedema Neuro: nonfocal, well oriented, appropriate affect Breasts: The right breast is status post lumpectomy and radiation.  The cosmetic result is very good.  There is no evidence of local recurrence.  The left breast is benign.  Both axillae are benign   LAB RESULTS:  CMP     Component Value Date/Time   NA 140 02/22/2019 1239   K 4.0 02/22/2019 1239   CL 104 02/22/2019 1239   CO2 27 02/22/2019 1239   GLUCOSE 102 (H) 02/22/2019 1239   BUN 21 02/22/2019 1239   CREATININE 1.10 (H) 02/22/2019 1239   CREATININE 1.38 (H) 08/03/2017 1114   CALCIUM 8.8  (L) 02/22/2019 1239   PROT 7.4 02/22/2019 1239   ALBUMIN 3.6 02/22/2019 1239   AST 21 02/22/2019 1239   AST 30 08/03/2017 1114   ALT 14 02/22/2019 1239   ALT 31 08/03/2017 1114   ALKPHOS 94 02/22/2019 1239   BILITOT 0.3 02/22/2019 1239   BILITOT 0.5 08/03/2017 1114   GFRNONAA 53 (L) 02/22/2019 1239   GFRNONAA 40 (  L) 08/03/2017 1114   GFRAA >60 02/22/2019 1239   GFRAA 46 (L) 08/03/2017 1114    Lab Results  Component Value Date   WBC 7.7 02/25/2020   NEUTROABS 5.0 02/25/2020   HGB 10.9 (L) 02/25/2020   HCT 34.2 (L) 02/25/2020   MCV 78.1 (L) 02/25/2020   PLT 255 02/25/2020    No results found for: LABCA2  No components found for: VHQION629  No results for input(s): INR in the last 168 hours.  No results found for: LABCA2  No results found for: BMW413  No results found for: KGM010  No results found for: UVO536  No results found for: CA2729  No components found for: HGQUANT  No results found for: CEA1 / No results found for: CEA1   No results found for: AFPTUMOR  No results found for: CHROMOGRNA  No results found for: TOTALPROTELP, ALBUMINELP, A1GS, A2GS, BETS, BETA2SER, GAMS, MSPIKE, SPEI (this displays SPEP labs)  No results found for: KPAFRELGTCHN, LAMBDASER, KAPLAMBRATIO (kappa/lambda light chains)  No results found for: HGBA, HGBA2QUANT, HGBFQUANT, HGBSQUAN (Hemoglobinopathy evaluation)   No results found for: LDH  Lab Results  Component Value Date   IRON 170 (H) 07/12/2017   TIBC 303 07/12/2017   IRONPCTSAT 56 07/12/2017   (Iron and TIBC)  Lab Results  Component Value Date   FERRITIN 685 (H) 07/12/2017    Urinalysis No results found for: COLORURINE, APPEARANCEUR, LABSPEC, PHURINE, GLUCOSEU, HGBUR, BILIRUBINUR, KETONESUR, PROTEINUR, UROBILINOGEN, NITRITE, LEUKOCYTESUR   STUDIES: CT Chest High Resolution  Result Date: 02/23/2020 CLINICAL DATA:  64 year old female with history of interstitial lung disease. Cough. EXAM: CT CHEST WITHOUT  CONTRAST TECHNIQUE: Multidetector CT imaging of the chest was performed following the standard protocol without intravenous contrast. High resolution imaging of the lungs, as well as inspiratory and expiratory imaging, was performed. COMPARISON:  Chest CT 01/30/2019. FINDINGS: Cardiovascular: Heart size is normal. There is no significant pericardial fluid, thickening or pericardial calcification. There is aortic atherosclerosis, as well as atherosclerosis of the great vessels of the mediastinum and the coronary arteries, including calcified atherosclerotic plaque in the left anterior descending coronary artery. Mediastinum/Nodes: No pathologically enlarged mediastinal or hilar lymph nodes. Please note that accurate exclusion of hilar adenopathy is limited on noncontrast CT scans. Esophagus is unremarkable in appearance. No axillary lymphadenopathy. Lungs/Pleura: High-resolution images demonstrates some subpleural reticulation in the periphery of the right lung deep to the right breast involving predominantly the right middle and upper lobes, as well as in the apex of the right upper lobe adjacent to the right axilla, favored to represent chronic postradiation changes from prior radiation therapy to the right breast and right axilla. High-resolution images otherwise demonstrate no generalized regions of ground-glass attenuation, septal thickening, subpleural reticulation, parenchymal banding, traction bronchiectasis or frank honeycombing. Inspiratory and expiratory imaging demonstrates mild to moderate air trapping indicative of small airways disease. No acute consolidative airspace disease. No pleural effusions. No definite suspicious appearing pulmonary nodules or masses are noted. Upper Abdomen: Aortic atherosclerosis. Musculoskeletal: Postoperative changes of prior lumpectomy in the right breast. There are no aggressive appearing lytic or blastic lesions noted in the visualized portions of the skeleton.  IMPRESSION: 1. Although there are areas of fibrosis in the right lung, this is most compatible with post radiation fibrosis in the portions of the right lung deep to the right breast in the right axilla. No compelling imaging findings to suggest interstitial lung disease on today's examination. 2. Mild to moderate air trapping indicative of small airways disease. 3.  Aortic atherosclerosis, in addition to left anterior descending coronary artery disease. Please note that although the presence of coronary artery calcium documents the presence of coronary artery disease, the severity of this disease and any potential stenosis cannot be assessed on this non-gated CT examination. Assessment for potential risk factor modification, dietary therapy or pharmacologic therapy may be warranted, if clinically indicated. 4. Additional incidental findings, as above. Aortic Atherosclerosis (ICD10-I70.0). Electronically Signed   By: Vinnie Langton M.D.   On: 02/23/2020 06:56    ELIGIBLE FOR AVAILABLE RESEARCH PROTOCOL: no  ASSESSMENT: 64 y.o. [place] Woman status post right breast upper outer quadrant biopsy 05/02/2017 for a clinical T2 N1-2, stage IIIB invasive ductal carcinoma, grade 2-3, triple negative, with an MIB-1 80%  (a) breast MRI 05/18/2017 shows T3 N1 disease with possible involvement of the internal mammary nodes  (1) neoadjuvant chemotherapy consisting of doxorubicin and cyclophosphamide in dose dense fashion x4 starting 05/24/2017, completed 07/05/2017  (a) planned carboplatin/paclitaxel x12 omitted secondary to peripheral neuropathy concerns  (2) status post right lumpectomy 08/30/2017 showing a complete pathologic response (ypT0 ypN0)  (a) a total of 5 lymph nodes were removed  (3) adjuvant radiation :10/12/2017-11/28/2017 Site/dose:   1. Right breast, Ax_SCV, 1.8 Gy in 25 fractions for a total dose of 45 Gy.                       2. Right breast, 1.8 Gy in 28 fractions for a total dose of 50.4  Gy                      3. Boost, 2 Gy in 5 fractions for a total dose of 10 Gy  (4) Genetics testing offered through [facility] Multi-cancer Panel on 08/03/2017 showed a pathogenic variant in PALB2 ([id number] (p.Gln60Argfs*7)  (a) there was a VUS identified in CDH1  (b) no additional deleterious mutations were noted in ALK, APC, ATM, AXIN2, BAP1, BARD1, BLM, BMPR1A, BRCA1, BRCA2, BRIP1, CASR, CDC73, CDH1, CDK4, CDKN1B, CDKN1C, CDKN2A (p14ARF), CDKN2A (p16INK4a), CEBPA, CHEK2, CTNNA1, DICER1, DIS3L2, EPCAM*, FH, FLCN, GATA2, GPC3, GREM1*, HRAS, KIT, MAX, MEN1, MET, MLH1, MSH2, MSH3, MSH6, MUTYH, NBN, NF1, NF2, PALB2, PDGFRA, PHOX2B*, PMS2, POLD1, POLE, POT1, PRKAR1A, PTCH1, PTEN, RAD50, RAD51C, RAD51D, RB1, RECQL4, RET, RUNX1, SDHAF2, SDHB, SDHC, SDHD, SMAD4, SMARCA4, SMARCB1, SMARCE1, STK11, SUFU, TERC, TERT, TMEM127, TP53, TSC1, TSC2, VHL, WRN*, WT1. The following genes were evaluated for sequence changes only: EGFR*, HOXB13*, MITF*, NTHL1*, SDHA Results are negative unless otherwise indicated  (c) per NCCN guidelines, there is insufficient data to associate PALB2 mutations with increased ovarian cancer risk; this is managed according to family history  (5) PALB2: intensified screening:  (a) mammography with tomography every November and see Dr. Donne Hazel December  (b) breast MRI every May and see me in June  (c) family is aware and those who wish to be tested have been tested  (6) PALB2: breast cancer prophylaxis:   (a) anastrozole October 2019, discontinued after a few weeks secondary to hair loss and other symptoms.  (7) thalassemia: With MCV of 76.5 and ferritin 685 on April 2019   PLAN: Emily Perez is now 2-1/2 years out from definitive surgery for her breast cancer with no evidence of disease recurrence.  This is very favorable.  She has not been able to take aromatase inhibitor for prophylaxis.  We discussed that again today and I specifically offered her tamoxifen.  She declines.    We  are continuing intensified screening.  Things do when piling up but basically she gets mammography in November and then can see Dr. Donne Hazel in December.  She will have a breast MRI in May and see me in June.  She had left carpal tunnel release surgery remotely.  That side has worked the worse peripheral neuropathy but she also has some neuropathy in her feet and right hand.  She is interested in seeing a neurologist to discuss further and I have placed that referral  I have encouraged her to walk more frequently  She will return to see me in June of next year.  She knows to call for any other issue that may develop before then  Total encounter time 25 minutes.*  Nikola Marone, Emily Dad, MD  02/25/20 1:29 PM Medical Oncology and Hematology [facility] [place], [place] [address] Tel. [phone]    Fax. [phone]   I, Wilburn Mylar, am acting as scribe for Dr. Virgie Perez. Chani Ghanem.  I, Lurline Del MD, have reviewed the above documentation for accuracy and completeness, and I agree with the above.   *Total Encounter Time as defined by the Centers for Medicare and Medicaid Services includes, in addition to the face-to-face time of a patient visit (documented in the note above) non-face-to-face time: obtaining and reviewing outside history, ordering and reviewing medications, tests or procedures, care coordination (communications with other health care professionals or caregivers) and documentation in the medical record.

## 2020-02-25 ENCOUNTER — Other Ambulatory Visit: Payer: Self-pay

## 2020-02-25 ENCOUNTER — Telehealth: Payer: Self-pay | Admitting: Oncology

## 2020-02-25 ENCOUNTER — Inpatient Hospital Stay: Payer: Medicare HMO

## 2020-02-25 ENCOUNTER — Inpatient Hospital Stay: Payer: Medicare HMO | Attending: Oncology | Admitting: Oncology

## 2020-02-25 VITALS — BP 130/76 | HR 78 | Temp 97.8°F | Resp 18 | Ht 63.5 in | Wt 170.5 lb

## 2020-02-25 DIAGNOSIS — Z171 Estrogen receptor negative status [ER-]: Secondary | ICD-10-CM | POA: Diagnosis not present

## 2020-02-25 DIAGNOSIS — Z833 Family history of diabetes mellitus: Secondary | ICD-10-CM | POA: Insufficient documentation

## 2020-02-25 DIAGNOSIS — Z923 Personal history of irradiation: Secondary | ICD-10-CM | POA: Insufficient documentation

## 2020-02-25 DIAGNOSIS — T451X5A Adverse effect of antineoplastic and immunosuppressive drugs, initial encounter: Secondary | ICD-10-CM | POA: Diagnosis not present

## 2020-02-25 DIAGNOSIS — D563 Thalassemia minor: Secondary | ICD-10-CM | POA: Diagnosis not present

## 2020-02-25 DIAGNOSIS — Z8349 Family history of other endocrine, nutritional and metabolic diseases: Secondary | ICD-10-CM | POA: Diagnosis not present

## 2020-02-25 DIAGNOSIS — G620 Drug-induced polyneuropathy: Secondary | ICD-10-CM | POA: Diagnosis not present

## 2020-02-25 DIAGNOSIS — Z841 Family history of disorders of kidney and ureter: Secondary | ICD-10-CM | POA: Diagnosis not present

## 2020-02-25 DIAGNOSIS — J8410 Pulmonary fibrosis, unspecified: Secondary | ICD-10-CM | POA: Insufficient documentation

## 2020-02-25 DIAGNOSIS — Z8042 Family history of malignant neoplasm of prostate: Secondary | ICD-10-CM | POA: Insufficient documentation

## 2020-02-25 DIAGNOSIS — J849 Interstitial pulmonary disease, unspecified: Secondary | ICD-10-CM

## 2020-02-25 DIAGNOSIS — C50919 Malignant neoplasm of unspecified site of unspecified female breast: Secondary | ICD-10-CM

## 2020-02-25 DIAGNOSIS — Z836 Family history of other diseases of the respiratory system: Secondary | ICD-10-CM | POA: Diagnosis not present

## 2020-02-25 DIAGNOSIS — Z803 Family history of malignant neoplasm of breast: Secondary | ICD-10-CM | POA: Diagnosis not present

## 2020-02-25 DIAGNOSIS — Z8249 Family history of ischemic heart disease and other diseases of the circulatory system: Secondary | ICD-10-CM | POA: Insufficient documentation

## 2020-02-25 DIAGNOSIS — C50811 Malignant neoplasm of overlapping sites of right female breast: Secondary | ICD-10-CM | POA: Insufficient documentation

## 2020-02-25 DIAGNOSIS — D569 Thalassemia, unspecified: Secondary | ICD-10-CM | POA: Insufficient documentation

## 2020-02-25 DIAGNOSIS — C50511 Malignant neoplasm of lower-outer quadrant of right female breast: Secondary | ICD-10-CM | POA: Diagnosis not present

## 2020-02-25 DIAGNOSIS — M818 Other osteoporosis without current pathological fracture: Secondary | ICD-10-CM | POA: Diagnosis not present

## 2020-02-25 DIAGNOSIS — G5602 Carpal tunnel syndrome, left upper limb: Secondary | ICD-10-CM | POA: Diagnosis not present

## 2020-02-25 DIAGNOSIS — I700 Atherosclerosis of aorta: Secondary | ICD-10-CM | POA: Diagnosis not present

## 2020-02-25 DIAGNOSIS — C773 Secondary and unspecified malignant neoplasm of axilla and upper limb lymph nodes: Secondary | ICD-10-CM | POA: Diagnosis not present

## 2020-02-25 DIAGNOSIS — Z79899 Other long term (current) drug therapy: Secondary | ICD-10-CM | POA: Diagnosis not present

## 2020-02-25 DIAGNOSIS — Z95828 Presence of other vascular implants and grafts: Secondary | ICD-10-CM

## 2020-02-25 DIAGNOSIS — G629 Polyneuropathy, unspecified: Secondary | ICD-10-CM | POA: Insufficient documentation

## 2020-02-25 LAB — CBC WITH DIFFERENTIAL/PLATELET
Abs Immature Granulocytes: 0.01 10*3/uL (ref 0.00–0.07)
Basophils Absolute: 0.1 10*3/uL (ref 0.0–0.1)
Basophils Relative: 1 %
Eosinophils Absolute: 0.2 10*3/uL (ref 0.0–0.5)
Eosinophils Relative: 2 %
HCT: 34.2 % — ABNORMAL LOW (ref 36.0–46.0)
Hemoglobin: 10.9 g/dL — ABNORMAL LOW (ref 12.0–15.0)
Immature Granulocytes: 0 %
Lymphocytes Relative: 21 %
Lymphs Abs: 1.6 10*3/uL (ref 0.7–4.0)
MCH: 24.9 pg — ABNORMAL LOW (ref 26.0–34.0)
MCHC: 31.9 g/dL (ref 30.0–36.0)
MCV: 78.1 fL — ABNORMAL LOW (ref 80.0–100.0)
Monocytes Absolute: 0.8 10*3/uL (ref 0.1–1.0)
Monocytes Relative: 11 %
Neutro Abs: 5 10*3/uL (ref 1.7–7.7)
Neutrophils Relative %: 65 %
Platelets: 255 10*3/uL (ref 150–400)
RBC: 4.38 MIL/uL (ref 3.87–5.11)
RDW: 15.6 % — ABNORMAL HIGH (ref 11.5–15.5)
WBC: 7.7 10*3/uL (ref 4.0–10.5)
nRBC: 0 % (ref 0.0–0.2)

## 2020-02-25 LAB — COMPREHENSIVE METABOLIC PANEL
ALT: 9 U/L (ref 0–44)
AST: 17 U/L (ref 15–41)
Albumin: 3.7 g/dL (ref 3.5–5.0)
Alkaline Phosphatase: 76 U/L (ref 38–126)
Anion gap: 5 (ref 5–15)
BUN: 24 mg/dL — ABNORMAL HIGH (ref 8–23)
CO2: 30 mmol/L (ref 22–32)
Calcium: 9 mg/dL (ref 8.9–10.3)
Chloride: 104 mmol/L (ref 98–111)
Creatinine, Ser: 1.23 mg/dL — ABNORMAL HIGH (ref 0.44–1.00)
GFR, Estimated: 48 mL/min — ABNORMAL LOW (ref >60)
Glucose, Bld: 94 mg/dL (ref 70–99)
Potassium: 4.2 mmol/L (ref 3.5–5.1)
Sodium: 139 mmol/L (ref 135–145)
Total Bilirubin: 0.3 mg/dL (ref 0.3–1.2)
Total Protein: 7.6 g/dL (ref 6.5–8.1)

## 2020-02-25 MED ORDER — HEPARIN SOD (PORK) LOCK FLUSH 100 UNIT/ML IV SOLN
500.0000 [IU] | Freq: Once | INTRAVENOUS | Status: DC
  Filled 2020-02-25: qty 5

## 2020-02-25 MED ORDER — SODIUM CHLORIDE 0.9% FLUSH
10.0000 mL | INTRAVENOUS | Status: DC | PRN
  Filled 2020-02-25: qty 10

## 2020-02-25 NOTE — Progress Notes (Signed)
Not accessed, MD's office aware

## 2020-02-25 NOTE — Telephone Encounter (Signed)
Scheduled appts per 12/6 los. Gave pt a print out of AVS.  

## 2020-02-26 ENCOUNTER — Ambulatory Visit (INDEPENDENT_AMBULATORY_CARE_PROVIDER_SITE_OTHER): Payer: Medicare HMO | Admitting: Internal Medicine

## 2020-02-26 ENCOUNTER — Encounter: Payer: Self-pay | Admitting: Neurology

## 2020-02-26 ENCOUNTER — Other Ambulatory Visit: Payer: Self-pay | Admitting: *Deleted

## 2020-02-26 DIAGNOSIS — J849 Interstitial pulmonary disease, unspecified: Secondary | ICD-10-CM

## 2020-02-26 LAB — PULMONARY FUNCTION TEST
DL/VA % pred: 108 %
DL/VA: 4.53 ml/min/mmHg/L
DLCO cor % pred: 73 %
DLCO cor: 14.35 ml/min/mmHg
DLCO unc % pred: 73 %
DLCO unc: 14.35 ml/min/mmHg
FEF 25-75 Pre: 2.31 L/sec
FEF2575-%Pred-Pre: 127 %
FEV1-%Pred-Pre: 88 %
FEV1-Pre: 1.68 L
FEV1FVC-%Pred-Pre: 110 %
FEV6-%Pred-Pre: 82 %
FEV6-Pre: 1.94 L
FEV6FVC-%Pred-Pre: 103 %
FVC-%Pred-Pre: 79 %
FVC-Pre: 1.94 L
Pre FEV1/FVC ratio: 86 %
Pre FEV6/FVC Ratio: 100 %

## 2020-02-26 LAB — POCT EXHALED NITRIC OXIDE: FeNO level (ppb): 19

## 2020-02-26 NOTE — Progress Notes (Signed)
Spirometry/DLCO and Feno performed today.

## 2020-02-28 DIAGNOSIS — K5730 Diverticulosis of large intestine without perforation or abscess without bleeding: Secondary | ICD-10-CM | POA: Diagnosis not present

## 2020-02-28 DIAGNOSIS — Z86010 Personal history of colonic polyps: Secondary | ICD-10-CM | POA: Diagnosis not present

## 2020-02-28 DIAGNOSIS — Z1211 Encounter for screening for malignant neoplasm of colon: Secondary | ICD-10-CM | POA: Diagnosis not present

## 2020-03-04 ENCOUNTER — Other Ambulatory Visit: Payer: Self-pay

## 2020-03-04 ENCOUNTER — Encounter: Payer: Self-pay | Admitting: Internal Medicine

## 2020-03-04 ENCOUNTER — Ambulatory Visit (INDEPENDENT_AMBULATORY_CARE_PROVIDER_SITE_OTHER): Payer: Medicare HMO | Admitting: Internal Medicine

## 2020-03-04 VITALS — BP 120/82 | HR 61 | Temp 98.8°F | Ht 63.5 in | Wt 169.8 lb

## 2020-03-04 DIAGNOSIS — J849 Interstitial pulmonary disease, unspecified: Secondary | ICD-10-CM

## 2020-03-04 DIAGNOSIS — J701 Chronic and other pulmonary manifestations due to radiation: Secondary | ICD-10-CM | POA: Diagnosis not present

## 2020-03-04 DIAGNOSIS — Z836 Family history of other diseases of the respiratory system: Secondary | ICD-10-CM

## 2020-03-04 NOTE — Progress Notes (Signed)
OV 04/25/2019 -ILD center visit.  Referred by Dr. Rodman Pickle  Subjective:  Patient ID: Emily Perez, female , DOB: 06-10-1955 , age 64 y.o. , MRN: [id number] , [place] [address]   04/25/2019 -   Chief Complaint  Patient presents with  . Consult    Referred by Dr. Loanne Drilling for possible ILD. Patient reports dry cough. She denies sob.      HPI Emily Perez 64 y.o. -    [facility] Integrated Comprehensive ILD Questionnaire  Symptoms: *Insidious onset of cough that started in March 2020 following radiation chemotherapy in 2019 for breast cancer on the right side.  Since it started the cough is the same.  Is moderate but occasionally severe.  She does cough at night particularly the cough is worse towards the end of the day but not necessarily wakes her up at night.  Cough is worse with brushing of the teeth and also increase mobility.  In the past she is coughed up an occasional blood.  The cough is not worse when she lies down does not affect her voice.  She does clear her throat sometimes.  There is a tickle in the throat.  She does not have any dyspnea except extremely mild dyspnea when she climbs stairs.  There is no wheezing.  SYMPTOM SCALE - ILD 04/25/2019   O2 use ra  Shortness of Breath 0 -> 5 scale with 5 being worst (score 6 If unable to do)  At rest 0  Simple tasks - showers, clothes change, eating, shaving 0  Household (dishes, doing bed, laundry) 0  Shopping 0  Walking level at own pace 0  Walking up Stairs 2  Total (30-36) Dyspnea Score 2  How bad is your cough? 2.5  How bad is your fatigue yes  How bad is nausea x  How bad is vomiting?  x  How bad is diarrhea? x  How bad is anxiety? x  How bad is depression x       Past Medical History : Denies any asthma, COPD, heart failure, rheumatoid arthritis, scleroderma, lupus, polymyositis.  Denies Sjogren's or hiatal hernia acid reflux.  Denies any sleep apnea denies any HIV.   Denies pulmonary hypertension denies diabetes denies thyroid disease denies stroke.  Denies seizures denies mononucleosis or hepatitis.  Denies kidney disease.  Denies pneumonia.  Denies blood clots denies heart disease denies pleurisy.  She has hypertension and orthopedic problems with a phone meniscus.   ROS: Positive for fatigue, arthralgia, pain in her fingers due to neuropathy contractures but no [place].  She has lost 36 pounds of weight because of breast cancer treatment in the last 2 years.  She is known to have chronic dermatitis.  Otherwise no nausea vomiting or dysphagia   FAMILY HISTORY of LUNG DISEASE: Strongly positive for pulmonary fibrosis.  She believes both sisters have idiopathic pulmonary fibrosis.  She has a sister currently 55 years of age who has had IPF since she was approximately 32.  This is just on nintedanib.  Analysis is in [place] who is 66 years of age and has been diagnosed with IPF for the last few months the sister is also on nintedanib.  Based on their experience of nintedanib patient is reluctant to do nintedanib.  The sisters also have fibromyalgia.   EXPOSURE HISTORY:-There were kids but dad smoked.  Otherwise no smoking exposure.  No marijuana use currently.  In the past she did smoke  marijuana 1980s occasionally.  Never use cocaine abuse intravenous drug use.   HOME and HOBBY DETAILS : [place]family [place] the last several months.  Age of the home is 40 years.  Strongly denies all the mold and mildew questions and any organic antigen exposure questions in our history   OCCUPATIONAL HISTORY (122 questions) : Positive for possible asbestos exposure when she was at [facility] and working that there is some renovation going on for 2 years.  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  In this renovation at [facility] she did work in a dusty environment.   They were removing asbestos from an old building.   PULMONARY TOXICITY HISTORY (27 items): In August 2019 she underwent right breast radiation for 32 days.  Before that that she got chemo and radiation    Testing history as below  Results for Emily Perez (MRN [id number]) as of 04/25/2019 12:06  Ref. Range 02/19/2019 09:40  FVC-Pre Latest Units: L 1.99  FVC-%Pred-Pre Latest Units: % 80  FEV1-Pre Latest Units: L 1.61  FEV1-%Pred-Pre Latest Units: % 83  Pre FEV1/FVC ratio Latest Units: % 81  FEV1FVC-%Pred-Pre Latest Units: % 103  Results for Emily Perez (MRN [id number]) as of 04/25/2019 12:06  Ref. Range 02/19/2019 09:40  DLCO unc Latest Units: ml/min/mmHg 11.12  DLCO unc % pred Latest Units: % 57    Results for Emily Perez (MRN [id number]) as of 04/25/2019 12:06  Ref. Range 02/20/2019 11:11  Anti Nuclear Antibody (ANA) Latest Ref Range: NEGATIVE  NEGATIVE  Cyclic Citrullin Peptide Ab Latest Units: UNITS <16  [employer] Latest Ref Range: NOT DETECT  NOT DETECTED  RA Latex Turbid. Latest Ref Range: <14 IU/mL <14    HRCT NOV 2020 -personally visualized and personally interpreted the result and agree with the formal report from the radiologist.  IMPRESSION: 1. New bandlike radiation fibrosis in the peripheral mid to upper right lung. 2. Chronic mild patchy subpleural reticulation and ground-glass attenuation throughout both lungs without bronchiectasis or honeycombing, unchanged since 05/18/2017 chest CT. Findings may represent a mild nonprogressive underlying interstitial lung disease such as nonspecific interstitial pneumonia (NSIP). Usual interstitial pneumonia (UIP) is considered less likely but not entirely excluded. Findings are indeterminate for UIP per consensus guidelines: Diagnosis of Idiopathic Pulmonary Fibrosis: An Official ATS/ERS/JRS/ALAT Clinical Practice Guideline. [place], Iss 5, [phone], Nov 20 2016. 3. One vessel  coronary atherosclerosis. 4. No thoracic adenopathy or other findings of metastatic disease in the chest.  Aortic Atherosclerosis (ICD10-I70.0).   Electronically Signed   By: Ilona Sorrel M.D.   On: 01/30/2019 15:13   ROS - per HPI   ASSESSMENT/PLAN   Mild and stable x 2 years Probably not IPF which your sisters x 2 have Probably non-IPF stable varity provoked by radiation and genetics Still there is a  chance this is IPF  Plan  - support non-biopsy approach  - supprt serial monitoring approach - cancel upcoming HRCT - do spirometry and dlco in 3-4 months - we can discuss visiting genetic counselor Roma Kayser at next visit      OV 08/28/2019  Subjective:  Patient ID: Emily Perez, female , DOB: Sep 08, 1955 , age 55 y.o. , MRN: [id number] , [address] [place] [address]   08/28/2019 -  Chief Complaint  Patient presents with  . Follow-up    Pt states she has been doing okay since last visit and denies any complaints.   Follow-up interstitial lung disease [indeterminate for UIP pattern with family history of pulmonary fibrosis and also breast cancer radiation] -preferred supportive care as a goals of care  HPI Emily Perez 64 y.o. -returns for follow-up.  For interstitial lung disease.  Overall she is stable compared to when I saw her in February 2021.  She is not reporting any worsening symptoms although in the symptom score maybe her dyspnea is worse but she denies this subjectively.  She says the main issue is her cough.  She has no cough in the summer but in the winter and cold air cough gets worse.  She says despite all this she has a constant presence of a gag particularly when she is brushing the teeth and she has to clear her throat and cough quite violently.  There is no wheezing or dyspnea during this episode.  This been going on for over a year it is new onset.  She thinks it might be related to mint toothpaste but then she has been  using the mint toothpaste for many years.  She is frustrated by this.  Overall she prefers a nonpharmaceutical approach.  She is not wanting to undergo lung biopsy.  We discussed antifibrotic's and she is reluctant to take them upfront even if it means that her underlying lung disease might be UIP.  She prefers a wait and watch approach even if it means loss of lung function.  She says she might consider antifibrotic's if there is progression of interstitial lung disease.  At this point in time cough control and gag control is the main concern she has.  She is also willing to see Ms. Roma Kayser genetics counselor for family history of pulmonary fibrosis.   Results for CLEVELAND, PAIZ (MRN [id number]) as of 08/28/2019 12:01  Ref. Range 02/19/2019 09:40 08/24/2019 13:39  FVC-Pre Latest Units: L 1.99 2.01  FVC-%Pred-Pre Latest Units: % 80 81    Results for WANA, MOUNT (MRN [id number]) as of 08/28/2019 12:01  Ref. Range 02/19/2019 09:40 08/24/2019 13:39  DLCO unc Latest Units: ml/min/mmHg 11.12 15.50  DLCO unc % pred Latest Units: % 57 79    OV 03/04/2020   Subjective:  Patient ID: Emily Perez, female , DOB: [birth year], age 13 y.o. years. , MRN: [id number],  ADDRESS: [place] [id number] PCP  Shirline Frees, MD Providers : Treatment Team:  Attending Provider: Brand Males, MD Patient Care Team: Shirline Frees, MD as PCP - General (Family Medicine) Magrinat, Virgie Dad, MD as Consulting Physician (Oncology) Rolm Bookbinder, MD as Consulting Physician (General Surgery) Gery Pray, MD as Consulting Physician (Radiation Oncology) Allyn Kenner, MD (Dermatology) Thurnell Lose, MD as Consulting Physician (Obstetrics and Gynecology) Marchia Bond, MD as Consulting Physician (Orthopedic Surgery) Christene Slates, MD as Physician Assistant (Radiology) Margaretha Seeds, MD as Consulting Physician (Pulmonary Disease) Brand Males, MD as Consulting Physician  (Pulmonary Disease)    Chief Complaint  Patient presents with  . Follow-up    ILD, doing ok     Follow-up interstitial lung disease [indeterminate for UIP pattern with family history of pulmonary fibrosis and also breast cancer radiation] -preferred supportive care as a goals of care    HPI Emily Perez 64 y.o. -returns for follow-up.  Last seen in the summer 2021.  Overall she is  stable.  She feels she does not have interstitial lung disease.  Most recent spirometry and DLCO shows mild restriction with slight reduction in diffusion suggesting of an interstitial abnormality but it is stable over time.  She had a high-resolution CT chest this was read by Dr. Weber Cooks.  I personally visualized and I do agree with this point that most of the changes are in the right behind the right breast in the right upper lobe consistent with radiation fibrosis.  Overall the severity is mild.  However I do think I also agree with Dr. Salvatore [name] from last year whether some mild interstitial abnormalities on the left side as well.  I visualized these images and showed these images to the patient.  She prefers to have an expectant approach.  I support this because she is feeling well.  In addition most likely etiology is radiation.  She is hesitant to do biopsy or antifibrotic's.  Therefore we can have a discussion about antifibrotic's if she progresses.  The overall prognosis for the next year appears to be good but she is aware that anything like smoker viruses can flareup her ILD.  We did discuss the fact that ILD even non-- IPF varieties can take a progressive course unexpectedly.   SYMPTOM SCALE - ILD 04/25/2019  June 2021 03/04/2020   O2 use ra ra ra  Shortness of Breath 0 -> 5 scale with 5 being worst (score 6 If unable to do)    At rest 0 0 0  Simple tasks - showers, clothes change, eating, shaving 0 1 0  Household (dishes, doing bed, laundry) 0 1 0  Shopping 0 0 0  Walking level at own pace 0 1  0.5  Walking up Stairs 2 2 1   Total (30-36) Dyspnea Score 2 4 1.5  How bad is your cough? 2.5 0 cough insummer. 1 and more severe on cold weather.. Gag when brushing teeth Only cough when cold and bette than last year.   How bad is your fatigue yes 2 0  How bad is nausea x 0 0  How bad is vomiting?  x 0 0  How bad is diarrhea? x 0 0  How bad is anxiety? x 2 2.5  How bad is depression x 0 1   Simple office walk 185 feet x  3 laps goal with forehead probe 03/04/2020   O2 used ra  Number laps completed 3  Comments about pace fast  Resting Pulse Ox/HR 98% and 61/min  Final Pulse Ox/HR 97% and 100/min  Desaturated </= 88% no  Desaturated <= 3% points no  Got Tachycardic >/= 90/min yes  Symptoms at end of test none  Miscellaneous comments x       PFT Results Latest Ref Rng & Units 02/26/2020 08/24/2019 02/19/2019  FVC-Pre L 1.94 2.01 1.99  FVC-Predicted Pre % 79 81 80  FVC-Post L - 1.86 1.85  FVC-Predicted Post % - 75 74  Pre FEV1/FVC % % 86 82 81  Post FEV1/FCV % % - 86 87  FEV1-Pre L 1.68 1.65 1.61  FEV1-Predicted Pre % 88 85 83  FEV1-Post L - 1.60 1.61  DLCO uncorrected ml/min/mmHg 14.35 15.50 11.12  DLCO UNC% % 73 79 57  DLCO corrected ml/min/mmHg 14.35 - -  DLCO COR %Predicted % 73 - -  DLVA Predicted % 108 110 92    CT chest HRCT 02/22/20  CLINICAL DATA:  64 year old female with history of interstitial lung disease. Cough.  EXAM: CT  CHEST WITHOUT CONTRAST  TECHNIQUE: Multidetector CT imaging of the chest was performed following the standard protocol without intravenous contrast. High resolution imaging of the lungs, as well as inspiratory and expiratory imaging, was performed.  COMPARISON:  Chest CT 01/30/2019.  FINDINGS: Cardiovascular: Heart size is normal. There is no significant pericardial fluid, thickening or pericardial calcification. There is aortic atherosclerosis, as well as atherosclerosis of the great vessels of the mediastinum and the  coronary arteries, including calcified atherosclerotic plaque in the left anterior descending coronary artery.  Mediastinum/Nodes: No pathologically enlarged mediastinal or hilar lymph nodes. Please note that accurate exclusion of hilar adenopathy is limited on noncontrast CT scans. Esophagus is unremarkable in appearance. No axillary lymphadenopathy.  Lungs/Pleura: High-resolution images demonstrates some subpleural reticulation in the periphery of the right lung deep to the right breast involving predominantly the right middle and upper lobes, as well as in the apex of the right upper lobe adjacent to the right axilla, favored to represent chronic postradiation changes from prior radiation therapy to the right breast and right axilla. High-resolution images otherwise demonstrate no generalized regions of ground-glass attenuation, septal thickening, subpleural reticulation, parenchymal banding, traction bronchiectasis or frank honeycombing. Inspiratory and expiratory imaging demonstrates mild to moderate air trapping indicative of small airways disease. No acute consolidative airspace disease. No pleural effusions. No definite suspicious appearing pulmonary nodules or masses are noted.  Upper Abdomen: Aortic atherosclerosis.  Musculoskeletal: Postoperative changes of prior lumpectomy in the right breast. There are no aggressive appearing lytic or blastic lesions noted in the visualized portions of the skeleton.  IMPRESSION: 1. Although there are areas of fibrosis in the right lung, this is most compatible with post radiation fibrosis in the portions of the right lung deep to the right breast in the right axilla. No compelling imaging findings to suggest interstitial lung disease on today's examination. 2. Mild to moderate air trapping indicative of small airways disease. 3. Aortic atherosclerosis, in addition to left anterior descending coronary artery disease. Please note  that although the presence of coronary artery calcium documents the presence of coronary artery disease, the severity of this disease and any potential stenosis cannot be assessed on this non-gated CT examination. Assessment for potential risk factor modification, dietary therapy or pharmacologic therapy may be warranted, if clinically indicated. 4. Additional incidental findings, as above.  Aortic Atherosclerosis (ICD10-I70.0).   Electronically Signed   By: Vinnie Langton M.D.   On: 02/23/2020 06:56    has a past medical history of Allergic rhinitis, Arthritis, Breast cancer ([place]), Family history of breast cancer, Family history of prostate cancer, Headache, Hyperlipidemia, Hypertension, and PONV (postoperative nausea and vomiting).   reports that she has never smoked. She has never used smokeless tobacco.  Past Surgical History:  Procedure Laterality Date  . BREAST LUMPECTOMY WITH RADIOACTIVE SEED AND SENTINEL LYMPH NODE BIOPSY Right 08/30/2017   Procedure: RIGHT BREAST RADIOACTIVE SEED GUIDED LUMPECTOMY WITH RIGHT RADIOACTIVE SEED TARGETED AXILLARY LYMPH NODE EXCISION AND RIGHT SENTINEL LYMPH NODE BIOPSY;  Surgeon: Rolm Bookbinder, MD;  Location: [facility];  Service: General;  Laterality: Right;  . COLONOSCOPY    . FOOT SURGERY Bilateral   . FRACTURE SURGERY Left    wrist  . PORTACATH PLACEMENT Right 05/19/2017   Procedure: INSERTION PORT-A-CATH WITH ULTRASOUND;  Surgeon: Rolm Bookbinder, MD;  Location: [facility];  Service: General;  Laterality: Right;  . TUBAL LIGATION    . WISDOM TOOTH EXTRACTION      Allergies  Allergen Reactions  . Latex Hives  and Rash  . Penicillin V Other (See Comments)    Immunization History  Administered Date(s) Administered  . Moderna Sars-Covid-2 Vaccination 07/15/2019, 08/13/2019  . Pneumococcal Polysaccharide-23 12/27/2018  . Td 10/20/2005  . Zoster 01/10/2018  . Zoster Recombinat (Shingrix) 01/10/2018, 01/30/2019    Family History   Problem Relation Age of Onset  . Heart disease Mother   . Cancer Father   . Diabetes Father   . Hypertension Father   . Hyperlipidemia Father   . Emphysema Father   . Heart disease Brother   . Hypertension Brother   . Hyperlipidemia Brother   . Diabetes Brother   . Prostate cancer Brother 48  . Breast cancer Sister 32  . Breast cancer Sister 71  . Asthma Sister   . Cancer Paternal Aunt        unsure what type of cancer she had  . Prostate cancer Paternal Uncle   . Diabetes Brother   . Gout Brother   . Kidney disease Brother   . Lung disease Sister   . Breast cancer Sister      Current Outpatient Medications:  .  aspirin 81 MG tablet, Take 81 mg by mouth 3 (three) times a week. , Disp: , Rfl:  .  doxylamine, Sleep, (UNISOM) 25 MG tablet, Take 50 mg by mouth at bedtime as needed., Disp: , Rfl:  .  meclizine (ANTIVERT) 12.5 MG tablet, , Disp: , Rfl:  .  Multiple Vitamins-Minerals (MULTIVITAMIN WITH MINERALS) tablet, Take 1 tablet by mouth daily., Disp: , Rfl:  .  naproxen sodium (ANAPROX) 550 MG tablet, Take 550 mg by mouth daily as needed (pain). Takes 0.5-1 tablet as needed for pain, Disp: , Rfl:  .  ondansetron (ZOFRAN-ODT) 8 MG disintegrating tablet, , Disp: , Rfl:  .  OVER THE COUNTER MEDICATION, Apply 1 application topically at bedtime as needed. Pain Relief Balm, Disp: , Rfl:  .  polycarbophil (FIBERCON) 625 MG tablet, Take 625 mg by mouth as needed., Disp: , Rfl:  .  triamterene-hydrochlorothiazide (DYAZIDE) 37.5-25 MG capsule, Take 1 capsule by mouth daily., Disp: , Rfl: 3 .  vitamin B-12 (CYANOCOBALAMIN) 1000 MCG tablet, Take 500 mcg by mouth daily as needed. Takes 0.5 tablet, Disp: , Rfl:  .  zaleplon (SONATA) 5 MG capsule, TK 1 C PO QD HS PRN, Disp: , Rfl: 5      Objective:   Vitals:   03/04/20 1524  BP: 120/82  Pulse: 61  Temp: 98.8 F (37.1 C)  TempSrc: Oral  SpO2: 98%  Weight: 169 lb 12.8 oz (77 kg)  Height: 5' 3.5" (1.613 m)    Estimated body mass  index is 29.61 kg/m as calculated from the following:   Height as of this encounter: 5' 3.5" (1.613 m).   Weight as of this encounter: 169 lb 12.8 oz (77 kg).  @WEIGHTCHANGE @  [employer]   03/04/20 1524  Weight: 169 lb 12.8 oz (77 kg)     Physical Exam  General: No distress. Looks well Neuro: Alert and Oriented x 3. GCS 15. Speech normal Psych: Pleasant Resp:  Barrel Chest - no.  Wheeze - no, Crackles - no except rUL, No overt respiratory distress CVS: Normal heart sounds. Murmurs - no Ext: Stigmata of Connective Tissue Disease - no HEENT: Normal upper airway. PEERL +. No post nasal drip        Assessment:       ICD-10-CM   1. Fibrosis of lung following radiation ([place])  J70.1  Pulmonary function test  2. ILD (interstitial lung disease) ([facility])  J84.9 Pulmonary function test  3. Family history of pulmonary fibrosis  Z83.6        Plan:     Patient Instructions     ICD-10-CM   1. Fibrosis of lung following radiation ([place])  J70.1   2. ILD (interstitial lung disease) ([place])  J84.9   3. Family history of pulmonary fibrosis  Z83.6     I agree with the latest radiologist that most of the interstitial lung disease is radiation fibrosis on the right side.  In my personal professional opinion there might be some on the left side  The main thing is that you are feeling well and it is stable over time.  The overall prognosis on this is likely to be very good and I support an expectant approach  Respect deferral on seeing genetics counselor for fibrosis at this point in time  Plan -In 1 year do spirometry and DLCO  Follow-up -Return to see Dr. Chase Caller in a 15-minute slot in 1 year but after spirometry and DLCO [return sooner if needed]   (Level 04: Estb 30-39 min  visit type: on-site physical face to visit visit spent in total care time and counseling or/and coordination of care by this undersigned MD - Dr Brand Males. This includes one or more of the following on  this same day 03/04/2020: pre-charting, chart review, note writing, documentation discussion of test results, diagnostic or treatment recommendations, prognosis, risks and benefits of management options, instructions, education, compliance or risk-factor reduction. It excludes time spent by the [facility] or office staff in the care of the patient . Actual time is 31 min)   SIGNATURE    Dr. Brand Males, M.D., F.C.C.P,  Pulmonary and Critical Care Medicine Staff Physician, [place] Director - Interstitial Lung Disease  Program  Pulmonary [place] at [place], [place], [address]  Pager: [phone], If no answer or between  15:00h - 7:00h: call [phone] Telephone: [phone]  4:20 PM 03/04/2020

## 2020-03-04 NOTE — Patient Instructions (Addendum)
ICD-10-CM   1. Fibrosis of lung following radiation ([place])  J70.1   2. ILD (interstitial lung disease) ([place])  J84.9   3. Family history of pulmonary fibrosis  Z83.6     I agree with the latest radiologist that most of the interstitial lung disease is radiation fibrosis on the right side.  In my personal professional opinion there might be some on the left side  The main thing is that you are feeling well and it is stable over time.  The overall prognosis on this is likely to be very good and I support an expectant approach  Respect deferral on seeing genetics counselor for fibrosis at this point in time  Plan -In 1 year do spirometry and DLCO  Follow-up -Return to see Dr. Chase Caller in a 15-minute slot in 1 year but after spirometry and DLCO [return sooner if needed]

## 2020-03-10 ENCOUNTER — Telehealth: Payer: Self-pay | Admitting: Licensed Clinical Social Worker

## 2020-03-10 NOTE — Telephone Encounter (Signed)
[facility]   Received VM from patient requesting call back (no specific needs stated). CSW attempted to return call. No answer. Left VM with direct contact information.   Christeen Douglas, LCSW

## 2020-03-11 NOTE — Telephone Encounter (Signed)
Received return TC from patient. She is working on advanced directives for her daughter (being seen at another [facility] facility) and wanted clarification on what is involved in advanced directives. CSW gave general information. Patient appreciative. She will continue to work on communication with daughter's providers.   Christeen Douglas, LCSW

## 2020-04-08 ENCOUNTER — Ambulatory Visit: Payer: Medicare HMO | Admitting: Internal Medicine

## 2020-05-08 ENCOUNTER — Other Ambulatory Visit: Payer: Self-pay

## 2020-05-08 ENCOUNTER — Telehealth: Payer: Self-pay

## 2020-05-08 DIAGNOSIS — Z171 Estrogen receptor negative status [ER-]: Secondary | ICD-10-CM

## 2020-05-08 DIAGNOSIS — C50511 Malignant neoplasm of lower-outer quadrant of right female breast: Secondary | ICD-10-CM

## 2020-05-08 NOTE — Telephone Encounter (Signed)
Pt called in to request a referral for a counselor. Pt feels she needs counseling. Pt requested a [place] therapist if possible. Will notify MD.  Referral sent to [place] at [facility]

## 2020-05-23 ENCOUNTER — Ambulatory Visit (INDEPENDENT_AMBULATORY_CARE_PROVIDER_SITE_OTHER): Payer: Medicare HMO | Admitting: Neurology

## 2020-05-23 ENCOUNTER — Other Ambulatory Visit: Payer: Self-pay

## 2020-05-23 ENCOUNTER — Encounter: Payer: Self-pay | Admitting: Neurology

## 2020-05-23 VITALS — BP 126/82 | HR 84 | Ht 63.0 in | Wt 177.0 lb

## 2020-05-23 DIAGNOSIS — G620 Drug-induced polyneuropathy: Secondary | ICD-10-CM

## 2020-05-23 DIAGNOSIS — T451X5A Adverse effect of antineoplastic and immunosuppressive drugs, initial encounter: Secondary | ICD-10-CM

## 2020-05-23 NOTE — Patient Instructions (Addendum)
Nerve testing of the hands.  Do not apply lotion, moisturizer, or oil to your skin  Return to clinic in 3-4 months  [place] (EMG/NCS) INSTRUCTIONS  How to Prepare The neurologist conducting the EMG will need to know if you have certain medical conditions. Tell the neurologist and other EMG lab personnel if you: . Have a pacemaker or any other electrical medical device . Take blood-thinning medications . Have hemophilia, a blood-clotting disorder that causes prolonged bleeding Bathing Take a shower or bath shortly before your exam in order to remove oils from your skin. Don't apply lotions or creams before the exam.  What to Expect You'll likely be asked to change into a hospital gown for the procedure and lie down on an examination table. The following explanations can help you understand what will happen during the exam.  . Electrodes. The neurologist or a technician places surface electrodes at various locations on your skin depending on where you're experiencing symptoms. Or the neurologist may insert needle electrodes at different sites depending on your symptoms.  . Sensations. The electrodes will at times transmit a tiny electrical current that you may feel as a twinge or spasm. The needle electrode may cause discomfort or pain that usually ends shortly after the needle is removed. If you are concerned about discomfort or pain, you may want to talk to the neurologist about taking a short break during the exam.  . Instructions. During the needle EMG, the neurologist will assess whether there is any spontaneous electrical activity when the muscle is at rest - activity that isn't present in healthy muscle tissue - and the degree of activity when you slightly contract the muscle.  He or she will give you instructions on resting and contracting a muscle at appropriate times. Depending on what muscles and nerves the neurologist is examining, he or she may ask  you to change positions during the exam.  After your EMG You may experience some temporary, minor bruising where the needle electrode was inserted into your muscle. This bruising should fade within several days. If it persists, contact your primary care doctor.

## 2020-05-23 NOTE — Progress Notes (Signed)
[facility] Clinic Note - Initial Visit   Date: 05/23/20  Emily Perez MRN: [id number] DOB: [birth year]   Dear Dr. Jana Hakim:  Thank you for your kind referral of Emily Perez for consultation of neuropathy. Although her history is well known to you, please allow [place] to reiterate it for the purpose of our medical record. The patient was accompanied to the clinic by self.    History of Present Illness: Emily Perez is a 65 y.o. right-handed female with right breast cancer (2019), hyperlipidemia, and hypertension presenting for evaluation of neuropathy. In 2019, she starting having tingling and numbness in the hands >> feet with chemotherapy (doxorubicin, cyclophosphamide, carboplatin/paclitaxel).  It is worse over the fingers and toes. Over the past few years, the intensity of tingling has improved by 80%, and she remains bothered by the tingling in the hands, which is worse than the legs.  She denies weakness, imbalance, or falls.  She does not take any pain medications, besides tylenol as needed.    Out-side paper records, electronic medical record, and images have been reviewed where available and summarized as:  Lab Results  Component Value Date   NATFTDDU20 2,542 (H) 07/12/2017    Past Medical History:  Diagnosis Date  . Allergic rhinitis   . Arthritis   . Breast cancer ([place])   . Family history of breast cancer   . Family history of prostate cancer   . Headache    migraines in the past  . Hyperlipidemia   . Hypertension   . PONV (postoperative nausea and vomiting)    after 1st colonoscopy    Past Surgical History:  Procedure Laterality Date  . BREAST LUMPECTOMY WITH RADIOACTIVE SEED AND SENTINEL LYMPH NODE BIOPSY Right 08/30/2017   Procedure: RIGHT BREAST RADIOACTIVE SEED GUIDED LUMPECTOMY WITH RIGHT RADIOACTIVE SEED TARGETED AXILLARY LYMPH NODE EXCISION AND RIGHT SENTINEL LYMPH NODE BIOPSY;  Surgeon: Rolm Bookbinder, MD;   Location: [place];  Service: General;  Laterality: Right;  . COLONOSCOPY    . FOOT SURGERY Bilateral   . FRACTURE SURGERY Left    wrist  . PORTACATH PLACEMENT Right 05/19/2017   Procedure: INSERTION PORT-A-CATH WITH ULTRASOUND;  Surgeon: Rolm Bookbinder, MD;  Location: [place] [facility];  Service: General;  Laterality: Right;  . TUBAL LIGATION    . WISDOM TOOTH EXTRACTION       Medications:  Outpatient Encounter Medications as of 05/23/2020  Medication Sig  . aspirin 81 MG tablet Take 81 mg by mouth 3 (three) times a week.   . Cholecalciferol (D3 VITAMIN PO) Take by mouth.  . doxylamine, Sleep, (UNISOM) 25 MG tablet Take 50 mg by mouth at bedtime as needed.  . meclizine (ANTIVERT) 12.5 MG tablet   . naproxen sodium (ANAPROX) 550 MG tablet Take 550 mg by mouth daily as needed (pain). Takes 0.5-1 tablet as needed for pain  . ondansetron (ZOFRAN-ODT) 8 MG disintegrating tablet   . OVER THE COUNTER MEDICATION Apply 1 application topically at bedtime as needed. Pain Relief Balm  . polycarbophil (FIBERCON) 625 MG tablet Take 625 mg by mouth as needed.  . triamterene-hydrochlorothiazide (DYAZIDE) 37.5-25 MG capsule Take 1 capsule by mouth daily.  . vitamin B-12 (CYANOCOBALAMIN) 1000 MCG tablet Take 500 mcg by mouth daily as needed. Takes 0.5 tablet  . zaleplon (SONATA) 5 MG capsule TK 1 C PO QD HS PRN  . Multiple Vitamins-Minerals (MULTIVITAMIN WITH MINERALS) tablet Take 1 tablet by mouth daily. (Patient not taking: Reported on 05/23/2020)  No facility-administered encounter medications on file as of 05/23/2020.    Allergies:  Allergies  Allergen Reactions  . Latex Hives and Rash  . Penicillin V Other (See Comments)    Family History: Family History  Problem Relation Age of Onset  . Heart disease Mother   . Cancer Father   . Diabetes Father   . Hypertension Father   . Hyperlipidemia Father   . Emphysema Father   . Heart disease Brother   . Hypertension Brother   . Hyperlipidemia Brother   .  Diabetes Brother   . Prostate cancer Brother 42  . Breast cancer Sister 33  . Breast cancer Sister 50  . Asthma Sister   . Cancer Paternal Aunt        unsure what type of cancer she had  . Prostate cancer Paternal Uncle   . Diabetes Brother   . Gout Brother   . Kidney disease Brother   . Lung disease Sister   . Breast cancer Sister     Social History: Social History   Tobacco Use  . Smoking status: Never Smoker  . Smokeless tobacco: Never Used  Vaping Use  . Vaping Use: Never used  Substance Use Topics  . Alcohol use: No  . Drug use: No   Social History   Social History Narrative   Right Handed   Lives in a one story home    Drinks no caffeine     Vital Signs:  BP 126/82   Pulse 84   Ht 5\' 3"  (1.6 m)   Wt 177 lb (80.3 kg)   SpO2 99%   BMI 31.35 kg/m   Neurological Exam: MENTAL STATUS including orientation to time, place, person, recent and remote memory, attention span and concentration, language, and fund of knowledge is normal.  Speech is not dysarthric.  CRANIAL NERVES: II:  No visual field defects.  Unremarkable fundi.   III-IV-VI: Pupils equal round and reactive to light.  Normal conjugate, extra-ocular eye movements in all directions of gaze.  No nystagmus.  No ptosis.   V:  Normal facial sensation.    VII:  Normal facial symmetry and movements.   VIII:  Normal hearing and vestibular function.   IX-X:  Normal palatal movement.   XI:  Normal shoulder shrug and head rotation.   XII:  Normal tongue strength and range of motion, no deviation or fasciculation.  MOTOR:  No atrophy, fasciculations or abnormal movements.  No pronator drift.   Upper Extremity:  Right  Left  Deltoid  5/5   5/5   Biceps  5/5   5/5   Triceps  5/5   5/5   Infraspinatus 5/5  5/5  Medial pectoralis 5/5  5/5  Wrist extensors  5/5   5/5   Wrist flexors  5/5   5/5   Finger extensors  5/5   5/5   Finger flexors  5/5   5/5   Dorsal interossei  5/5   5/5   Abductor pollicis  5/5    5/5   Tone (Ashworth scale)  0  0   Lower Extremity:  Right  Left  Hip flexors  5/5   5/5   Hip extensors  5/5   5/5   Adductor 5/5  5/5  Abductor 5/5  5/5  Knee flexors  5/5   5/5   Knee extensors  5/5   5/5   Dorsiflexors  5/5   5/5   Plantarflexors  5/5   5/5  Toe extensors  5/5   5/5   Toe flexors  5/5   5/5   Tone (Ashworth scale)  0  0   MSRs:  Right        Left                  brachioradialis 2+  2+  biceps 2+  2+  triceps 2+  2+  patellar 2+  2+  ankle jerk 2+  2+  Hoffman no  no  plantar response down  down   SENSORY:  Reduced pin prick over the toes, otherwise normal temperature and vibration.  Romberg's sign absent. Tinel's sign at the elbow and wrist bilaterally is negative.  COORDINATION/GAIT: Normal finger-to- nose-finger.  Intact rapid alternating movements bilaterally. Gait narrow based and stable. Tandem and stressed gait intact.    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  Need to be sure there is no other overlapping entrapment neuropathy or cervical radiculopathy since her hands seem to be more involved.   PLAN/RECOMMENDATIONS:  NCS/EMG of the arms Symptoms are not bothersome enough to take neuralgesic medication, such as gabapentin  Further recommendations pending results.   Thank you for allowing me to participate in patient's care.  If I can answer any additional questions, I would be pleased to do so.    Sincerely,    Donika K. Posey Pronto, DO

## 2020-06-02 ENCOUNTER — Encounter: Payer: Self-pay | Admitting: Oncology

## 2020-06-04 ENCOUNTER — Other Ambulatory Visit: Payer: Self-pay

## 2020-06-04 DIAGNOSIS — C50511 Malignant neoplasm of lower-outer quadrant of right female breast: Secondary | ICD-10-CM

## 2020-06-04 DIAGNOSIS — Z171 Estrogen receptor negative status [ER-]: Secondary | ICD-10-CM

## 2020-06-17 DIAGNOSIS — L820 Inflamed seborrheic keratosis: Secondary | ICD-10-CM | POA: Diagnosis not present

## 2020-06-17 DIAGNOSIS — L818 Other specified disorders of pigmentation: Secondary | ICD-10-CM | POA: Diagnosis not present

## 2020-07-22 ENCOUNTER — Ambulatory Visit (INDEPENDENT_AMBULATORY_CARE_PROVIDER_SITE_OTHER): Payer: Medicare HMO | Admitting: Neurology

## 2020-07-22 ENCOUNTER — Other Ambulatory Visit: Payer: Self-pay

## 2020-07-22 DIAGNOSIS — T451X5A Adverse effect of antineoplastic and immunosuppressive drugs, initial encounter: Secondary | ICD-10-CM | POA: Diagnosis not present

## 2020-07-22 DIAGNOSIS — G620 Drug-induced polyneuropathy: Secondary | ICD-10-CM

## 2020-07-22 DIAGNOSIS — G5622 Lesion of ulnar nerve, left upper limb: Secondary | ICD-10-CM

## 2020-07-22 DIAGNOSIS — G5601 Carpal tunnel syndrome, right upper limb: Secondary | ICD-10-CM

## 2020-07-22 NOTE — Procedures (Signed)
[facility]  [address]  [place], [place] [address] Tel: [phone] Fax:  [phone] Test Date:  07/22/2020  Patient: Emily Perez DOB: May 03, 1955 Physician: Narda Amber, DO  Sex: Female Height: 5\' 3"  Ref Phys: Narda Amber, DO  ID#: [id number]   Technician:    Patient Complaints: This is a 65 year old female with chemotherapy-induced neuropathy referred for evaluation of bilateral hand paresthesias.  NCV & EMG Findings: Extensive electrodiagnostic testing of the right upper extremity and additional studies of the left shows:  1. Right median sensory response shows prolonged latency (3.9 ms).  Left median, left mixed palmar, bilateral ulnar, and bilateral radial sensory responses are within normal limits. 2. Right median motor response shows prolonged latency (4.1 ms).  Left median and right ulnar motor responses are within normal limits.  Left ulnar motor response shows slowed conduction velocity across the elbow (A Elbow-B Elbow, 43 m/s).   3. Chronic motor axonal loss changes are seen affecting the right abductor pollicis brevis muscle and left ulnar innervated muscles.  There is no evidence of accompanied active denervation.  Impression: 1. Right median neuropathy at or distal to the wrist (moderate), consistent with a clinical diagnosis of carpal tunnel syndrome.   2. Left ulnar neuropathy with slowing across the elbow, purely demyelinating, mild. 3. There is no evidence of a sensorimotor polyneuropathy or cervical radiculopathy affecting the upper extremities.   ___________________________ Narda Amber, DO    Nerve Conduction Studies Anti Sensory Summary Table   Stim Site NR Peak (ms) Norm Peak (ms) P-T Amp (V) Norm P-T Amp  Left Median Anti Sensory (2nd Digit)  35C  Wrist    3.5 <3.8 33.3 >10  Right Median Anti Sensory (2nd Digit)  35C  Wrist    3.9 <3.8 14.0 >10  Left Radial Anti Sensory (Base 1st Digit)  35C  Wrist    2.4 <2.8 25.0  >10  Right Radial Anti Sensory (Base 1st Digit)  35C  Wrist    2.3 <2.8 18.9 >10  Left Ulnar Anti Sensory (5th Digit)  35C  Wrist    3.1 <3.2 27.2 >5  Right Ulnar Anti Sensory (5th Digit)  35C  Wrist    2.8 <3.2 25.0 >5   Motor Summary Table   Stim Site NR Onset (ms) Norm Onset (ms) O-P Amp (mV) Norm O-P Amp Site1 Site2 Delta-0 (ms) Dist (cm) Vel (m/s) Norm Vel (m/s)  Left Median Motor (Abd Poll Brev)  35C  Wrist    3.4 <4.0 9.7 >5 Elbow Wrist 5.1 30.0 59 >50  Elbow    8.5  8.5         Right Median Motor (Abd Poll Brev)  35C  Wrist    4.1 <4.0 8.4 >5 Elbow Wrist 5.4 29.0 54 >50  Elbow    9.5  8.0         Left Ulnar Motor (Abd Dig Minimi)  35C  Wrist    1.7 <3.1 8.6 >7 B Elbow Wrist 3.6 22.0 61 >50  B Elbow    5.3  7.9  A Elbow B Elbow 2.3 10.0 43 >50  A Elbow    7.6  7.7         Right Ulnar Motor (Abd Dig Minimi)  35C  Wrist    2.2 <3.1 8.6 >7 B Elbow Wrist 3.9 24.0 62 >50  B Elbow    6.1  7.6  A Elbow B Elbow 1.9 10.0 53 >50  A Elbow  8.0  7.4          Comparison Summary Table   Stim Site NR Peak (ms) Norm Peak (ms) P-T Amp (V) Site1 Site2 Delta-P (ms) Norm Delta (ms)  Left Median/Ulnar Palm Comparison (Wrist - 8cm)  35C  Median Palm    1.8 <2.2 45.2 Median Palm Ulnar Palm 0.2   Ulnar Palm    1.6 <2.2 23.7       EMG   Side Muscle Ins Act Fibs Psw Fasc Number Recrt Dur Dur. Amp Amp. Poly Poly. Comment  Right 1stDorInt Nml Nml Nml Nml Nml Nml Nml Nml Nml Nml Nml Nml N/A  Right Abd Poll Brev Nml Nml Nml Nml 1- Rapid Some 1+ Some 1+ Some 1+ N/A  Right PronatorTeres Nml Nml Nml Nml Nml Nml Nml Nml Nml Nml Nml Nml N/A  Right Biceps Nml Nml Nml Nml Nml Nml Nml Nml Nml Nml Nml Nml N/A  Right Triceps Nml Nml Nml Nml Nml Nml Nml Nml Nml Nml Nml Nml N/A  Right Deltoid Nml Nml Nml Nml Nml Nml Nml Nml Nml Nml Nml Nml N/A  Left 1stDorInt Nml Nml Nml Nml 1- Rapid Some 1+ Some 1+ Some 1+ N/A  Left PronatorTeres Nml Nml Nml Nml Nml Nml Nml Nml Nml Nml Nml Nml N/A  Left Biceps  Nml Nml Nml Nml Nml Nml Nml Nml Nml Nml Nml Nml N/A  Left Triceps Nml Nml Nml Nml Nml Nml Nml Nml Nml Nml Nml Nml N/A  Left Deltoid Nml Nml Nml Nml Nml Nml Nml Nml Nml Nml Nml Nml N/A  Left ABD Dig Min Nml Nml Nml Nml 1- Rapid Some 1+ Some 1+ Some 1+ N/A  Left FlexDigProf 4,5 Nml Nml Nml Nml Nml Nml Nml Nml Nml Nml Nml Nml N/A      Waveforms:

## 2020-08-15 ENCOUNTER — Other Ambulatory Visit: Payer: Self-pay

## 2020-08-15 ENCOUNTER — Ambulatory Visit
Admission: RE | Admit: 2020-08-15 | Discharge: 2020-08-15 | Disposition: A | Discharge status: Home / Self Care | Payer: BC Managed Care – PPO | Source: Ambulatory Visit | Attending: Oncology | Admitting: Oncology

## 2020-08-15 DIAGNOSIS — Z171 Estrogen receptor negative status [ER-]: Secondary | ICD-10-CM

## 2020-08-15 DIAGNOSIS — C50511 Malignant neoplasm of lower-outer quadrant of right female breast: Secondary | ICD-10-CM

## 2020-08-15 DIAGNOSIS — Z803 Family history of malignant neoplasm of breast: Secondary | ICD-10-CM | POA: Diagnosis not present

## 2020-08-15 DIAGNOSIS — Z853 Personal history of malignant neoplasm of breast: Secondary | ICD-10-CM | POA: Diagnosis not present

## 2020-08-15 MED ORDER — GADOPENTETATE DIMEGLUMINE 469.01 MG/ML IV SOLN
10.0000 mL | Freq: Once | INTRAVENOUS | Status: AC | PRN
  Administered 2020-08-15: 10 mL via INTRAVENOUS

## 2020-08-19 ENCOUNTER — Inpatient Hospital Stay: Payer: Medicare HMO | Attending: Oncology | Admitting: Oncology

## 2020-08-19 ENCOUNTER — Other Ambulatory Visit: Payer: Self-pay

## 2020-08-19 ENCOUNTER — Inpatient Hospital Stay: Payer: Medicare HMO

## 2020-08-19 VITALS — BP 128/72 | HR 72 | Temp 97.6°F | Resp 18 | Ht 63.0 in | Wt 179.3 lb

## 2020-08-19 DIAGNOSIS — Z836 Family history of other diseases of the respiratory system: Secondary | ICD-10-CM | POA: Diagnosis not present

## 2020-08-19 DIAGNOSIS — Z803 Family history of malignant neoplasm of breast: Secondary | ICD-10-CM | POA: Insufficient documentation

## 2020-08-19 DIAGNOSIS — C779 Secondary and unspecified malignant neoplasm of lymph node, unspecified: Secondary | ICD-10-CM | POA: Diagnosis not present

## 2020-08-19 DIAGNOSIS — Z171 Estrogen receptor negative status [ER-]: Secondary | ICD-10-CM | POA: Insufficient documentation

## 2020-08-19 DIAGNOSIS — Z833 Family history of diabetes mellitus: Secondary | ICD-10-CM | POA: Diagnosis not present

## 2020-08-19 DIAGNOSIS — Z880 Allergy status to penicillin: Secondary | ICD-10-CM | POA: Insufficient documentation

## 2020-08-19 DIAGNOSIS — Z79899 Other long term (current) drug therapy: Secondary | ICD-10-CM | POA: Insufficient documentation

## 2020-08-19 DIAGNOSIS — M818 Other osteoporosis without current pathological fracture: Secondary | ICD-10-CM | POA: Diagnosis not present

## 2020-08-19 DIAGNOSIS — Z9221 Personal history of antineoplastic chemotherapy: Secondary | ICD-10-CM | POA: Diagnosis not present

## 2020-08-19 DIAGNOSIS — G629 Polyneuropathy, unspecified: Secondary | ICD-10-CM | POA: Diagnosis not present

## 2020-08-19 DIAGNOSIS — Z8349 Family history of other endocrine, nutritional and metabolic diseases: Secondary | ICD-10-CM | POA: Diagnosis not present

## 2020-08-19 DIAGNOSIS — Z8042 Family history of malignant neoplasm of prostate: Secondary | ICD-10-CM | POA: Diagnosis not present

## 2020-08-19 DIAGNOSIS — D569 Thalassemia, unspecified: Secondary | ICD-10-CM | POA: Diagnosis not present

## 2020-08-19 DIAGNOSIS — C50919 Malignant neoplasm of unspecified site of unspecified female breast: Secondary | ICD-10-CM

## 2020-08-19 DIAGNOSIS — Z809 Family history of malignant neoplasm, unspecified: Secondary | ICD-10-CM | POA: Insufficient documentation

## 2020-08-19 DIAGNOSIS — C50511 Malignant neoplasm of lower-outer quadrant of right female breast: Secondary | ICD-10-CM

## 2020-08-19 DIAGNOSIS — Z1509 Genetic susceptibility to other malignant neoplasm: Secondary | ICD-10-CM | POA: Insufficient documentation

## 2020-08-19 DIAGNOSIS — Z841 Family history of disorders of kidney and ureter: Secondary | ICD-10-CM | POA: Insufficient documentation

## 2020-08-19 DIAGNOSIS — Z8249 Family history of ischemic heart disease and other diseases of the circulatory system: Secondary | ICD-10-CM | POA: Insufficient documentation

## 2020-08-19 LAB — CBC WITH DIFFERENTIAL/PLATELET
Abs Immature Granulocytes: 0.01 10*3/uL (ref 0.00–0.07)
Basophils Absolute: 0.1 10*3/uL (ref 0.0–0.1)
Basophils Relative: 1 %
Eosinophils Absolute: 0.1 10*3/uL (ref 0.0–0.5)
Eosinophils Relative: 1 %
HCT: 34.6 % — ABNORMAL LOW (ref 36.0–46.0)
Hemoglobin: 11.1 g/dL — ABNORMAL LOW (ref 12.0–15.0)
Immature Granulocytes: 0 %
Lymphocytes Relative: 24 %
Lymphs Abs: 1.7 10*3/uL (ref 0.7–4.0)
MCH: 25.3 pg — ABNORMAL LOW (ref 26.0–34.0)
MCHC: 32.1 g/dL (ref 30.0–36.0)
MCV: 78.8 fL — ABNORMAL LOW (ref 80.0–100.0)
Monocytes Absolute: 0.6 10*3/uL (ref 0.1–1.0)
Monocytes Relative: 9 %
Neutro Abs: 4.6 10*3/uL (ref 1.7–7.7)
Neutrophils Relative %: 65 %
Platelets: 203 10*3/uL (ref 150–400)
RBC: 4.39 MIL/uL (ref 3.87–5.11)
RDW: 14.6 % (ref 11.5–15.5)
WBC: 7 10*3/uL (ref 4.0–10.5)
nRBC: 0 % (ref 0.0–0.2)

## 2020-08-19 LAB — COMPREHENSIVE METABOLIC PANEL
ALT: 12 U/L (ref 0–44)
AST: 21 U/L (ref 15–41)
Albumin: 3.6 g/dL (ref 3.5–5.0)
Alkaline Phosphatase: 72 U/L (ref 38–126)
Anion gap: 10 (ref 5–15)
BUN: 28 mg/dL — ABNORMAL HIGH (ref 8–23)
CO2: 27 mmol/L (ref 22–32)
Calcium: 9.2 mg/dL (ref 8.9–10.3)
Chloride: 105 mmol/L (ref 98–111)
Creatinine, Ser: 1.3 mg/dL — ABNORMAL HIGH (ref 0.44–1.00)
GFR, Estimated: 45 mL/min — ABNORMAL LOW (ref >60)
Glucose, Bld: 99 mg/dL (ref 70–99)
Potassium: 4.4 mmol/L (ref 3.5–5.1)
Sodium: 142 mmol/L (ref 135–145)
Total Bilirubin: 0.4 mg/dL (ref 0.3–1.2)
Total Protein: 7.4 g/dL (ref 6.5–8.1)

## 2020-08-19 NOTE — Progress Notes (Signed)
[place]  Telephone:([phone]) [phone] [phone]     ID: Emily Perez DOB: 09-Dec-1955  MR#: [id number]  SPQ#:[id number]  Patient Care Team: Shirline Frees, MD as PCP - General (Family Medicine) Demico Ploch, Virgie Dad, MD as Consulting Physician (Oncology) Rolm Bookbinder, MD as Consulting Physician (General Surgery) Gery Pray, MD as Consulting Physician (Radiation Oncology) Allyn Kenner, MD (Dermatology) Thurnell Lose, MD as Consulting Physician (Obstetrics and Gynecology) Marchia Bond, MD as Consulting Physician (Orthopedic Surgery) Christene Slates, MD as Physician Assistant (Radiology) Margaretha Seeds, MD as Consulting Physician (Pulmonary Disease) Brand Males, MD as Consulting Physician (Pulmonary Disease) Alda Berthold, DO as Consulting Physician (Neurology) OTHER MD:   CHIEF COMPLAINT: Triple negative breast cancer; PALB2 mutation  CURRENT TREATMENT: intensified screening   INTERVAL HISTORY: Brita returns today for follow up of her triple negative breast cancer.  She is under intensified screening for her PALB mutation  Since her last visit, she underwent breast MRI on 08/15/2020 showing: breast composition B; no evidence of malignancy in either breast.   REVIEW OF SYSTEMS: Louisa has gained a little weight and this concerns her.  She is going to the gym for 5 times a week.  She runs errands and visits with friends.  She tells me she has a good diet.  Generally feels she feels fine.  Her only complaint is sleeping.  She has difficulty falling asleep and staying asleep.  This is however not a new problem.  A detailed review of systems was otherwise stable.   COVID 19 VACCINATION STATUS: Status post Modernax2, with booster November 2021   HISTORY OF CURRENT ILLNESS: From the original intake note:  Emily Perez had routine bilateral screening mammography at [facility] on 04/19/2017 showing a possible abnormality in the right breast.  She underwent unilateral right diagnostic mammography with tomography and right breast ultrasonography at [facility] on 04/25/2017 showing: breast density category B.  In the right breast at the 6:00 radiant 2 cm from the nipple there was a 2 cm oval mass which by ultrasound measured 2.5 cm and was irregular and hypoechoic.  There were also at least 3 abnormal appearing lymph nodes in the right axilla.  Accordingly on 05/02/2017 she proceeded to biopsy of the right breast mass in question and lymph node sampling. The pathology from this procedure showed ([id number]): Invasive ductal carcinoma grade II. One lymph node positive for metastatic carcinoma (1/1). Prognostic indicators significant for: estrogen receptor, 0% negative and progesterone receptor, 0% negative. Proliferation marker Ki67 at 80%. HER2 not amplified with ratios HER2/CEP17 signals 1.29 and average copies per cell 2.25  The patient's subsequent history is as detailed below.   PAST MEDICAL HISTORY: Past Medical History:  Diagnosis Date  . Allergic rhinitis   . Arthritis   . Breast cancer ([place])   . Family history of breast cancer   . Family history of prostate cancer   . Headache    migraines in the past  . Hyperlipidemia   . Hypertension   . PONV (postoperative nausea and vomiting)    after 1st colonoscopy  Migraines in the past.    PAST SURGICAL HISTORY: Past Surgical History:  Procedure Laterality Date  . BREAST LUMPECTOMY WITH RADIOACTIVE SEED AND SENTINEL LYMPH NODE BIOPSY Right 08/30/2017   Procedure: RIGHT BREAST RADIOACTIVE SEED GUIDED LUMPECTOMY WITH RIGHT RADIOACTIVE SEED TARGETED AXILLARY LYMPH NODE EXCISION AND RIGHT SENTINEL LYMPH NODE BIOPSY;  Surgeon: Rolm Bookbinder, MD;  Location: [place];  Service: General;  Laterality: Right;  .  COLONOSCOPY    . FOOT SURGERY Bilateral   . FRACTURE SURGERY Left    wrist  . PORTACATH PLACEMENT Right 05/19/2017   Procedure: INSERTION PORT-A-CATH WITH ULTRASOUND;  Surgeon:  Rolm Bookbinder, MD;  Location: [place];  Service: General;  Laterality: Right;  . TUBAL LIGATION    . WISDOM TOOTH EXTRACTION     Wrist Fracture. Torn Rotator cuff and torn meniscus.    FAMILY HISTORY Family History  Problem Relation Age of Onset  . Heart disease Mother   . Cancer Father   . Diabetes Father   . Hypertension Father   . Hyperlipidemia Father   . Emphysema Father   . Heart disease Brother   . Hypertension Brother   . Hyperlipidemia Brother   . Diabetes Brother   . Prostate cancer Brother 68  . Breast cancer Sister 49  . Breast cancer Sister 44  . Asthma Sister   . Cancer Paternal Aunt        unsure what type of cancer she had  . Prostate cancer Paternal Uncle   . Diabetes Brother   . Gout Brother   . Kidney disease Brother   . Lung disease Sister   . Breast cancer Sister   The patient's father died at age 51 due to heart disease and emphysema. The patient's mother died at age 22 due to heart disease. The patient has 6 brothers and 8 sisters. She notes 2 sisters with breast cancer. The 1st sister was diagnosed at age 16, and the 2nd sister was diagnosed at age 9. She notes that both sisters are alive. She also notes a paternal first cousin with breast cancer diagnosed in her 43's. The patient notes that her father also had bone cancer, but she's unsure if he had myeloma. She denies a family history of ovarian cancer.    GYNECOLOGIC HISTORY:  No LMP recorded. Patient is postmenopausal. 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 She is divorced. She lives by herself with no pets. The patient's daughter, Emily Perez, works in Therapist, art for [employer]. The patient's son, Emily Perez,  works for [employer]. The patient has  2 grandchildren. She belongs to [facility].      ADVANCED DIRECTIVES: Not in place.  At the 05/19/2017 visit the patient was given the appropriate documents to complete and notarized at her discretion   HEALTH MAINTENANCE: Social History   Tobacco Use  . Smoking status: Never Smoker  . Smokeless tobacco: Never Used  Vaping Use  . Vaping Use: Never used  Substance Use Topics  . Alcohol use: No  . Drug use: No     Colonoscopy:   PAP: September 2018 normal  Bone density: none   Allergies  Allergen Reactions  . Latex Hives and Rash  . Penicillin V Other (See Comments)    Current Outpatient Medications  Medication Sig Dispense Refill  . aspirin 81 MG tablet Take 81 mg by mouth 3 (three) times a week.     . Cholecalciferol (D3 VITAMIN PO) Take by mouth.    . doxylamine, Sleep, (UNISOM) 25 MG tablet Take 50 mg by mouth at bedtime as needed.    . meclizine (ANTIVERT) 12.5 MG tablet     . Multiple Vitamins-Minerals (MULTIVITAMIN WITH MINERALS) tablet Take 1 tablet by mouth  daily. (Patient not taking: Reported on 05/23/2020)    . naproxen sodium (ANAPROX) 550 MG tablet Take 550 mg by mouth daily as needed (pain). Takes 0.5-1 tablet as needed for pain    . ondansetron (ZOFRAN-ODT) 8 MG disintegrating tablet     . OVER THE COUNTER MEDICATION Apply 1 application topically at bedtime as needed. Pain Relief Balm    . polycarbophil (FIBERCON) 625 MG tablet Take 625 mg by mouth as needed.    . triamterene-hydrochlorothiazide (DYAZIDE) 37.5-25 MG capsule Take 1 capsule by mouth daily.  3  . vitamin B-12 (CYANOCOBALAMIN) 1000 MCG tablet Take 500 mcg by mouth daily as needed. Takes 0.5 tablet    . zaleplon (SONATA) 5 MG capsule TK 1 C PO QD HS PRN  5   No current facility-administered medications for this visit.    OBJECTIVE: African-American woman who appears well  Vitals:   08/19/20 1403  BP: 128/72  Pulse: 72  Resp: 18  Temp: 97.6 F (36.4 C)  SpO2: 98%     Body mass index is 31.76 kg/m.   Wt Readings from Last 3  Encounters:  08/19/20 179 lb 4.8 oz (81.3 kg)  05/23/20 177 lb (80.3 kg)  03/04/20 169 lb 12.8 oz (77 kg)   ECOG FS:1 - Symptomatic but completely ambulatory   Sclerae unicteric, EOMs intact Wearing a mask No cervical or supraclavicular adenopathy Lungs no rales or rhonchi Heart regular rate and rhythm Abd soft, nontender, positive bowel sounds MSK no focal spinal tenderness, no upper extremity lymphedema Neuro: nonfocal, well oriented, appropriate affect Breasts: The right breast has undergone lumpectomy and radiation.  There is no evidence of local recurrence.  The left breast and both axillae are benign   LAB RESULTS:  CMP     Component Value Date/Time   NA 142 08/19/2020 1317   K 4.4 08/19/2020 1317   CL 105 08/19/2020 1317   CO2 27 08/19/2020 1317   GLUCOSE 99 08/19/2020 1317   BUN 28 (H) 08/19/2020 1317   CREATININE 1.30 (H) 08/19/2020 1317   CREATININE 1.38 (H) 08/03/2017 1114   CALCIUM 9.2 08/19/2020 1317   PROT 7.4 08/19/2020 1317   ALBUMIN 3.6 08/19/2020 1317   AST 21 08/19/2020 1317   AST 30 08/03/2017 1114   ALT 12 08/19/2020 1317   ALT 31 08/03/2017 1114   ALKPHOS 72 08/19/2020 1317   BILITOT 0.4 08/19/2020 1317   BILITOT 0.5 08/03/2017 1114   GFRNONAA 45 (L) 08/19/2020 1317   GFRNONAA 40 (L) 08/03/2017 1114   GFRAA >60 02/22/2019 1239   GFRAA 46 (L) 08/03/2017 1114    Lab Results  Component Value Date   WBC 7.0 08/19/2020   NEUTROABS 4.6 08/19/2020   HGB 11.1 (L) 08/19/2020   HCT 34.6 (L) 08/19/2020   MCV 78.8 (L) 08/19/2020   PLT 203 08/19/2020    No results found for: LABCA2  No components found for: HMCNOB096  No results for input(s): INR in the last 168 hours.  No results found for: LABCA2  No results found for: GEZ662  No results found for: HUT654  No results found for: YTK354  No results found for: CA2729  No components found for: HGQUANT  No results found for: CEA1 / No results found for: CEA1   No results found for:  AFPTUMOR  No results found for: [place]  No results found for: TOTALPROTELP, ALBUMINELP, A1GS, A2GS, BETS, BETA2SER, GAMS, MSPIKE, SPEI (this displays SPEP labs)  No results found for: KPAFRELGTCHN,  LAMBDASER, KAPLAMBRATIO (kappa/lambda light chains)  No results found for: HGBA, HGBA2QUANT, HGBFQUANT, HGBSQUAN (Hemoglobinopathy evaluation)   No results found for: LDH  Lab Results  Component Value Date   IRON 170 (H) 07/12/2017   TIBC 303 07/12/2017   IRONPCTSAT 56 07/12/2017   (Iron and TIBC)  Lab Results  Component Value Date   FERRITIN 685 (H) 07/12/2017    Urinalysis No results found for: COLORURINE, APPEARANCEUR, LABSPEC, PHURINE, GLUCOSEU, HGBUR, BILIRUBINUR, KETONESUR, PROTEINUR, UROBILINOGEN, NITRITE, LEUKOCYTESUR   STUDIES: MR BREAST BILATERAL W [place] CAD  Result Date: 08/15/2020 CLINICAL DATA:  History of triple negative right breast cancer post lumpectomy 2019 with subsequent neoadjuvant chemotherapy and radiation therapy. Known PALB 2 gene mutation. Family history of breast cancer in 2 sisters. MRI guided biopsy scheduled 08/28/2019 not performed as 7 mm enhancing mass in the central right breast not reproducible at the time of biopsy. LABS:  None. EXAM: BILATERAL BREAST MRI WITH AND WITHOUT CONTRAST TECHNIQUE: Multiplanar, multisequence MR images of both breasts were obtained prior to and following the intravenous administration of 10 ml of Gadavist. Three-dimensional MR images were rendered by post-processing of the original MR data on an independent workstation. The three-dimensional MR images were interpreted, and findings are reported in the following complete MRI report for this study. Three dimensional images were evaluated at the independent interpreting workstation using the DynaCAD thin client. COMPARISON:  Previous exam(s). FINDINGS: Breast composition: b. Scattered fibroglandular tissue. Background parenchymal enhancement: Mild Right breast:  Evidence of post lumpectomy changes over the anterior to middle third of the retroareolar region. Previously seen 7 mm enhancing mass over the middle third of the central right breast is not present on the current exam. No suspicious mass or abnormal enhancement within the right breast. Left breast: No mass or abnormal enhancement. Lymph nodes: No abnormal appearing lymph nodes. Ancillary findings:  None. IMPRESSION: Post lumpectomy changes of the central right breast, otherwise unremarkable bilateral breast MRI without evidence of malignancy. RECOMMENDATION: Recommend continued annual bilateral screening mammographic follow-up as well as annual high risk screening breast MRI. The American Cancer Society recommends annual MRI and mammography in patients with an estimated lifetime risk of developing breast cancer greater than 20 - 25%, or who are known or suspected to be positive for the breast cancer gene. BI-RADS CATEGORY  2: Benign. Electronically Signed   By: Marin Olp M.D.   On: 08/15/2020 14:50   NCV with EMG(electromyography)  Result Date: 07/22/2020 Alda Berthold, DO     07/22/2020  3:10 PM [facility] [place], [place]  [place], [place] [address] Tel: [phone] Fax:  [phone] Test Date:  07/22/2020 Patient: Emily Perez DOB: 10/03/1955 Physician: Narda Amber, DO Sex: Female Height: 5' 3"  Ref Phys: Narda Amber, DO ID#: [id number]   Technician:  Patient Complaints: This is a 65 year old female with chemotherapy-induced neuropathy referred for evaluation of bilateral hand paresthesias. NCV & EMG Findings: Extensive electrodiagnostic testing of the right upper extremity and additional studies of the left shows: 1. Right median sensory response shows prolonged latency (3.9 ms).  Left median, left mixed palmar, bilateral ulnar, and bilateral radial sensory responses are within normal limits. 2. Right median motor response shows prolonged latency (4.1 ms).  Left median and right  ulnar motor responses are within normal limits.  Left ulnar motor response shows slowed conduction velocity across the elbow (A Elbow-B Elbow, 43 m/s).  3. Chronic motor axonal loss changes are seen affecting the right abductor  pollicis brevis muscle and left ulnar innervated muscles.  There is no evidence of accompanied active denervation. Impression: 1. Right median neuropathy at or distal to the wrist (moderate), consistent with a clinical diagnosis of carpal tunnel syndrome.  2. Left ulnar neuropathy with slowing across the elbow, purely demyelinating, mild. 3. There is no evidence of a sensorimotor polyneuropathy or cervical radiculopathy affecting the upper extremities. ___________________________ Narda Amber, DO Nerve Conduction Studies Anti Sensory Summary Table  Stim Site NR Peak (ms) Norm Peak (ms) P-T Amp (V) Norm P-T Amp Left Median Anti Sensory (2nd Digit)  35C Wrist    3.5 <3.8 33.3 >10 Right Median Anti Sensory (2nd Digit)  35C Wrist    3.9 <3.8 14.0 >10 Left Radial Anti Sensory (Base 1st Digit)  35C Wrist    2.4 <2.8 25.0 >10 Right Radial Anti Sensory (Base 1st Digit)  35C Wrist    2.3 <2.8 18.9 >10 Left Ulnar Anti Sensory (5th Digit)  35C Wrist    3.1 <3.2 27.2 >5 Right Ulnar Anti Sensory (5th Digit)  35C Wrist    2.8 <3.2 25.0 >5 Motor Summary Table  Stim Site NR Onset (ms) Norm Onset (ms) O-P Amp (mV) Norm O-P Amp Site1 Site2 Delta-0 (ms) Dist (cm) Vel (m/s) Norm Vel (m/s) Left Median Motor (Abd Poll Brev)  35C Wrist    3.4 <4.0 9.7 >5 Elbow Wrist 5.1 30.0 59 >50 Elbow    8.5  8.5        Right Median Motor (Abd Poll Brev)  35C Wrist    4.1 <4.0 8.4 >5 Elbow Wrist 5.4 29.0 54 >50 Elbow    9.5  8.0        Left Ulnar Motor (Abd Dig Minimi)  35C Wrist    1.7 <3.1 8.6 >7 B Elbow Wrist 3.6 22.0 61 >50 B Elbow    5.3  7.9  A Elbow B Elbow 2.3 10.0 43 >50 A Elbow    7.6  7.7        Right Ulnar Motor (Abd Dig Minimi)  35C Wrist    2.2 <3.1 8.6 >7 B Elbow Wrist 3.9 24.0 62 >50 B Elbow    6.1   7.6  A Elbow B Elbow 1.9 10.0 53 >50 A Elbow    8.0  7.4        Comparison Summary Table  Stim Site NR Peak (ms) Norm Peak (ms) P-T Amp (V) Site1 Site2 Delta-P (ms) Norm Delta (ms) Left Median/Ulnar Palm Comparison (Wrist - 8cm)  35C Median Palm    1.8 <2.2 45.2 Median Palm Ulnar Palm 0.2  Ulnar Palm    1.6 <2.2 23.7     EMG  Side Muscle Ins Act Fibs Psw Fasc Number Recrt Dur Dur. Amp Amp. Poly Poly. Comment Right 1stDorInt Nml Nml Nml Nml Nml Nml Nml Nml Nml Nml Nml Nml N/A Right Abd Poll Brev Nml Nml Nml Nml 1- Rapid Some 1+ Some 1+ Some 1+ N/A Right PronatorTeres Nml Nml Nml Nml Nml Nml Nml Nml Nml Nml Nml Nml N/A Right Biceps Nml Nml Nml Nml Nml Nml Nml Nml Nml Nml Nml Nml N/A Right Triceps Nml Nml Nml Nml Nml Nml Nml Nml Nml Nml Nml Nml N/A Right Deltoid Nml Nml Nml Nml Nml Nml Nml Nml Nml Nml Nml Nml N/A Left 1stDorInt Nml Nml Nml Nml 1- Rapid Some 1+ Some 1+ Some 1+ N/A Left PronatorTeres Nml Nml Nml Nml Nml Nml Nml Nml Nml Nml Nml Nml N/A  Left Biceps Nml Nml Nml Nml Nml Nml Nml Nml Nml Nml Nml Nml N/A Left Triceps Nml Nml Nml Nml Nml Nml Nml Nml Nml Nml Nml Nml N/A Left Deltoid Nml Nml Nml Nml Nml Nml Nml Nml Nml Nml Nml Nml N/A Left ABD Dig Min Nml Nml Nml Nml 1- Rapid Some 1+ Some 1+ Some 1+ N/A Left FlexDigProf 4,5 Nml Nml Nml Nml Nml Nml Nml Nml Nml Nml Nml Nml N/A Waveforms:               ELIGIBLE FOR AVAILABLE RESEARCH PROTOCOL: no  ASSESSMENT: 65 y.o. [place] Woman status post right breast upper outer quadrant biopsy 05/02/2017 for a clinical T2 N1-2, stage IIIB invasive ductal carcinoma, grade 2-3, triple negative, with an MIB-1 80%  (a) breast MRI 05/18/2017 shows T3 N1 disease with possible involvement of the internal mammary nodes  (1) neoadjuvant chemotherapy consisting of doxorubicin and cyclophosphamide in dose dense fashion x4 starting 05/24/2017, completed 07/05/2017  (a) planned carboplatin/paclitaxel x12 omitted secondary to peripheral neuropathy concerns  (2) status post  right lumpectomy 08/30/2017 showing a complete pathologic response (ypT0 ypN0)  (a) a total of 5 lymph nodes were removed  (3) adjuvant radiation :10/12/2017-11/28/2017 Site/dose:   1. Right breast, Ax_SCV, 1.8 Gy in 25 fractions for a total dose of 45 Gy.                       2. Right breast, 1.8 Gy in 28 fractions for a total dose of 50.4 Gy                      3. Boost, 2 Gy in 5 fractions for a total dose of 10 Gy  (4) Genetics testing offered through [facility] Multi-cancer Panel on 08/03/2017 showed a pathogenic variant in PALB2 ([id number] (p.Gln60Argfs*7)  (a) there was a VUS identified in CDH1  (b) no additional deleterious mutations were noted in ALK, APC, ATM, AXIN2, BAP1, BARD1, BLM, BMPR1A, BRCA1, BRCA2, BRIP1, CASR, CDC73, CDH1, CDK4, CDKN1B, CDKN1C, CDKN2A (p14ARF), CDKN2A (p16INK4a), CEBPA, CHEK2, CTNNA1, DICER1, DIS3L2, EPCAM*, FH, FLCN, GATA2, GPC3, GREM1*, HRAS, KIT, MAX, MEN1, MET, MLH1, MSH2, MSH3, MSH6, MUTYH, NBN, NF1, NF2, PALB2, PDGFRA, PHOX2B*, PMS2, POLD1, POLE, POT1, PRKAR1A, PTCH1, PTEN, RAD50, RAD51C, RAD51D, RB1, RECQL4, RET, RUNX1, SDHAF2, SDHB, SDHC, SDHD, SMAD4, SMARCA4, SMARCB1, SMARCE1, STK11, SUFU, TERC, TERT, TMEM127, TP53, TSC1, TSC2, VHL, WRN*, WT1. The following genes were evaluated for sequence changes only: EGFR*, HOXB13*, MITF*, NTHL1*, SDHA Results are negative unless otherwise indicated  (c) per NCCN guidelines, there is insufficient data to associate PALB2 mutations with increased ovarian cancer risk; this is managed according to family history  (5) PALB2: intensified screening:  (a) mammography with tomography every November and see Dr. Donne Hazel December  (b) breast MRI every May and see me in June  (c) family is aware and those who wish to be tested have been tested  (6) PALB2: breast cancer prophylaxis:   (a) anastrozole October 2019, discontinued after a few weeks secondary to hair loss and other symptoms.  (7) thalassemia: With MCV of 76.5  and ferritin 685 on April 2019   PLAN: Jemya is now just about 3 years out from definitive surgery for her breast cancer with no evidence of disease recurrence.  This is very favorable.  We are continuing intensified screening.  She just had a breast MRI which showed no evidence of disease.  She will have mammography in November and then  again a breast MRI next year.  She was supposed to see Dr. Donne Hazel in November but she tells me what she would like to do is see him in May and see me in November so we are going to make that change and she will see me the first week in December after her November mammogram  We discussed diet and weight issues.  She understands there is a difference between weight and fitness.  She looks very fit and very healthy right now.  We talked about what she can do to lose the 10 pounds she has gained without going on a very drastic diet  She knows to call for any other issue that may develop before the next visit  Total encounter time 25 minutes.*  Raykwon Hobbs, Virgie Dad, MD  08/19/20 5:01 PM [facility] [place], [place] [address] Tel. [phone]    Fax. [phone]   I, Wilburn Mylar, am acting as scribe for Dr. Virgie Dad. Alaysha Jefcoat.  I, Lurline Del MD, have reviewed the above documentation for accuracy and completeness, and I agree with the above.   *Total Encounter Time as defined by the Centers for Medicare and Medicaid Services includes, in addition to the face-to-face time of a patient visit (documented in the note above) non-face-to-face time: obtaining and reviewing outside history, ordering and reviewing medications, tests or procedures, care coordination (communications with other health care professionals or caregivers) and documentation in the medical record.

## 2020-08-20 ENCOUNTER — Telehealth: Payer: Self-pay | Admitting: Oncology

## 2020-08-20 NOTE — Telephone Encounter (Signed)
Scheduled appointment per 05/31 los. Patient is aware. 

## 2020-08-21 DIAGNOSIS — M170 Bilateral primary osteoarthritis of knee: Secondary | ICD-10-CM | POA: Diagnosis not present

## 2020-08-21 DIAGNOSIS — N1831 Chronic kidney disease, stage 3a: Secondary | ICD-10-CM | POA: Diagnosis not present

## 2020-08-21 DIAGNOSIS — I10 Essential (primary) hypertension: Secondary | ICD-10-CM | POA: Diagnosis not present

## 2020-08-26 ENCOUNTER — Ambulatory Visit: Payer: Medicare HMO | Admitting: Oncology

## 2020-09-01 ENCOUNTER — Encounter: Payer: Self-pay | Admitting: Neurology

## 2020-09-01 ENCOUNTER — Ambulatory Visit (INDEPENDENT_AMBULATORY_CARE_PROVIDER_SITE_OTHER): Payer: Medicare HMO | Admitting: Neurology

## 2020-09-01 ENCOUNTER — Other Ambulatory Visit: Payer: Self-pay

## 2020-09-01 VITALS — BP 118/73 | HR 88 | Ht 63.0 in | Wt 180.0 lb

## 2020-09-01 DIAGNOSIS — G5601 Carpal tunnel syndrome, right upper limb: Secondary | ICD-10-CM | POA: Diagnosis not present

## 2020-09-01 DIAGNOSIS — T451X5A Adverse effect of antineoplastic and immunosuppressive drugs, initial encounter: Secondary | ICD-10-CM | POA: Diagnosis not present

## 2020-09-01 DIAGNOSIS — G5622 Lesion of ulnar nerve, left upper limb: Secondary | ICD-10-CM | POA: Diagnosis not present

## 2020-09-01 DIAGNOSIS — G620 Drug-induced polyneuropathy: Secondary | ICD-10-CM

## 2020-09-01 NOTE — Progress Notes (Signed)
Follow-up Visit   Date: 09/01/20   Emily Perez MRN: [id number] DOB: 1956/03/11   Interim History: Emily Perez is a 65 y.o. right-handed female with right breast cancer (2019), hyperlipidemia, and hypertension returning to the clinic for follow-up of hand paresthesias.  The patient was accompanied to the clinic by self.  History of present illness:  In 2019, she starting having tingling and numbness in the hands >> feet with chemotherapy (doxorubicin, cyclophosphamide, carboplatin/paclitaxel).  It is worse over the fingers and toes. Over the past few years, the intensity of tingling has improved by 80%, and she remains bothered by the tingling in the hands, which is worse than the legs.  She denies weakness, imbalance, or falls.  She does not take any pain medications, besides tylenol as needed.   UPDATE 09/01/2020: She is here for follow-up visit.  Her EMG shows R CTS and L ulnar neuropathy at the elbow.  Her hands have not been bothering her as much lately, so she has not been using the brace.  Overall, she feels that her symptoms are a little better than before.  No new weakness, neck pain, or falls.  Numbness in the feet is mild and unchanged.  No new complaints.  Medications:  Current Outpatient Medications on File Prior to Visit  Medication Sig Dispense Refill   aspirin 81 MG tablet Take 81 mg by mouth 3 (three) times a week.      doxylamine, Sleep, (UNISOM) 25 MG tablet Take 50 mg by mouth at bedtime as needed.     meclizine (ANTIVERT) 12.5 MG tablet      Multiple Vitamins-Minerals (MULTIVITAMIN WITH MINERALS) tablet Take 1 tablet by mouth daily.     naproxen sodium (ANAPROX) 550 MG tablet Take 550 mg by mouth daily as needed (pain). Takes 0.5-1 tablet as needed for pain     ondansetron (ZOFRAN-ODT) 8 MG disintegrating tablet      OVER THE COUNTER MEDICATION Apply 1 application topically at bedtime as needed. Pain Relief Balm     polycarbophil (FIBERCON) 625 MG  tablet Take 625 mg by mouth as needed.     triamterene-hydrochlorothiazide (DYAZIDE) 37.5-25 MG capsule Take 1 capsule by mouth daily.  3   zaleplon (SONATA) 5 MG capsule TK 1 C PO QD HS PRN  5   No current facility-administered medications on file prior to visit.    Allergies:  Allergies  Allergen Reactions   Latex Hives and Rash   Penicillin V Other (See Comments)    Vital Signs:  BP 118/73   Pulse 88   Ht 5\' 3"  (1.6 m)   Wt 180 lb (81.6 kg)   SpO2 96%   BMI 31.89 kg/m     Neurological Exam: MENTAL STATUS including orientation to time, place, person, recent and remote memory, attention span and concentration, language, and fund of knowledge is normal.  Speech is not dysarthric.  CRANIAL NERVES:    Normal conjugate, extra-ocular eye movements in all directions of gaze.  No ptosis.   MOTOR:  Motor strength is 5/5 in all extremities.  No atrophy, fasciculations or abnormal movements.  No pronator drift.  Tone is normal.    MSRs:  Reflexes are 2+/4 throughout.  SENSORY:  Intact to vibration throughout.  COORDINATION/GAIT:  Gait narrow based and stable.   Data: NCS/EMG of the arms 07/22/2020: Right median neuropathy at or distal to the wrist (moderate), consistent with a clinical diagnosis of carpal tunnel syndrome.   Left ulnar  neuropathy with slowing across the elbow, purely demyelinating, mild. There is no evidence of a sensorimotor polyneuropathy or cervical radiculopathy affecting the upper extremities.    IMPRESSION/PLAN: Right carpal tunnel syndrome, moderate - start wearing at wrist brace at night time  Left ulnar neuropathy at the elbow, mild  - Strategies to avoid nerve stretching at the elbow discussed  Chemotherapy induced neuropathy affecting the feet  Return to clinic as needed  Thank you for allowing me to participate in patient's care.  If I can answer any additional questions, I would be pleased to do so.    Sincerely,    Trena Dunavan K. Posey Pronto,  DO

## 2020-09-01 NOTE — Patient Instructions (Signed)
For right hand, start using a wrist brace at night time as needed  For your left arm, try to avoid leaning on your elbow and over bending at the elbow

## 2020-09-10 DIAGNOSIS — M170 Bilateral primary osteoarthritis of knee: Secondary | ICD-10-CM | POA: Diagnosis not present

## 2020-09-10 DIAGNOSIS — M25562 Pain in left knee: Secondary | ICD-10-CM | POA: Diagnosis not present

## 2020-09-10 DIAGNOSIS — M25561 Pain in right knee: Secondary | ICD-10-CM | POA: Diagnosis not present

## 2020-09-16 DIAGNOSIS — G4700 Insomnia, unspecified: Secondary | ICD-10-CM | POA: Diagnosis not present

## 2020-09-16 DIAGNOSIS — Z6832 Body mass index (BMI) 32.0-32.9, adult: Secondary | ICD-10-CM | POA: Diagnosis not present

## 2020-09-16 DIAGNOSIS — E669 Obesity, unspecified: Secondary | ICD-10-CM | POA: Diagnosis not present

## 2020-09-16 DIAGNOSIS — E785 Hyperlipidemia, unspecified: Secondary | ICD-10-CM | POA: Diagnosis not present

## 2020-09-16 DIAGNOSIS — Z008 Encounter for other general examination: Secondary | ICD-10-CM | POA: Diagnosis not present

## 2020-09-16 DIAGNOSIS — G629 Polyneuropathy, unspecified: Secondary | ICD-10-CM | POA: Diagnosis not present

## 2020-09-16 DIAGNOSIS — Z803 Family history of malignant neoplasm of breast: Secondary | ICD-10-CM | POA: Diagnosis not present

## 2020-09-16 DIAGNOSIS — Z8249 Family history of ischemic heart disease and other diseases of the circulatory system: Secondary | ICD-10-CM | POA: Diagnosis not present

## 2020-09-16 DIAGNOSIS — G8929 Other chronic pain: Secondary | ICD-10-CM | POA: Diagnosis not present

## 2020-09-16 DIAGNOSIS — Z833 Family history of diabetes mellitus: Secondary | ICD-10-CM | POA: Diagnosis not present

## 2020-09-16 DIAGNOSIS — I10 Essential (primary) hypertension: Secondary | ICD-10-CM | POA: Diagnosis not present

## 2020-12-11 DIAGNOSIS — N1831 Chronic kidney disease, stage 3a: Secondary | ICD-10-CM | POA: Diagnosis not present

## 2020-12-11 DIAGNOSIS — I10 Essential (primary) hypertension: Secondary | ICD-10-CM | POA: Diagnosis not present

## 2020-12-11 DIAGNOSIS — Z833 Family history of diabetes mellitus: Secondary | ICD-10-CM | POA: Diagnosis not present

## 2020-12-11 DIAGNOSIS — Z0000 Encounter for general adult medical examination without abnormal findings: Secondary | ICD-10-CM | POA: Diagnosis not present

## 2020-12-11 DIAGNOSIS — R69 Illness, unspecified: Secondary | ICD-10-CM | POA: Diagnosis not present

## 2021-01-27 ENCOUNTER — Encounter: Payer: Self-pay | Admitting: Oncology

## 2021-02-11 ENCOUNTER — Encounter: Payer: Self-pay | Admitting: Oncology

## 2021-02-11 DIAGNOSIS — Z853 Personal history of malignant neoplasm of breast: Secondary | ICD-10-CM | POA: Diagnosis not present

## 2021-02-17 DIAGNOSIS — H02881 Meibomian gland dysfunction right upper eyelid: Secondary | ICD-10-CM | POA: Diagnosis not present

## 2021-02-18 ENCOUNTER — Other Ambulatory Visit: Payer: Self-pay

## 2021-02-18 DIAGNOSIS — C50511 Malignant neoplasm of lower-outer quadrant of right female breast: Secondary | ICD-10-CM

## 2021-02-18 NOTE — Progress Notes (Signed)
[place]  [phone] Fax:([phone]     ID: Emily Perez DOB: 08-May-1955  MR#: [id number]  IEP#:[id number]  Patient Care Team: Shirline Frees, MD as PCP - General (Family Medicine) Judith Demps, Virgie Dad, MD as Consulting Physician (Oncology) Rolm Bookbinder, MD as Consulting Physician (General Surgery) Gery Pray, MD as Consulting Physician (Radiation Oncology) Allyn Kenner, MD (Dermatology) Thurnell Lose, MD as Consulting Physician (Obstetrics and Gynecology) Marchia Bond, MD as Consulting Physician (Orthopedic Surgery) Christene Slates, MD as Physician Assistant (Radiology) Margaretha Seeds, MD as Consulting Physician (Pulmonary Disease) Brand Males, MD as Consulting Physician (Pulmonary Disease) Alda Berthold, DO as Consulting Physician (Neurology) OTHER MD:   CHIEF COMPLAINT: Triple negative breast cancer; PALB2 mutation  CURRENT TREATMENT: intensified screening   INTERVAL HISTORY: Emily Perez returns today for follow up of her triple negative breast cancer.  She is under intensified screening for her PALB mutation  Her most recent breast MRI on 08/15/2020 showed breast density category B.  There was no evidence of malignancy.  Bilateral mammography at [facility] 02/11/2021 again showed breast density category B.  There was no mammographic evidence of malignancy.  REVIEW OF SYSTEMS: Emily Perez has moved to a retirement community in [place].  She likes it fairly well.  There is a gym there and she is using it.  She also has cut the carbs and lost a significant amount of weight as a result.  She tells me family is doing "better".  A detailed review of systems today was otherwise stable.   COVID 19 VACCINATION STATUS: Status post Modernax2, with booster x2   HISTORY OF CURRENT ILLNESS: From the original intake note:  Emily Perez had routine bilateral screening mammography at [facility] on 04/19/2017 showing a possible abnormality in  the right breast. She underwent unilateral right diagnostic mammography with tomography and right breast ultrasonography at [facility] on 04/25/2017 showing: breast density category B.  In the right breast at the 6:00 radiant 2 cm from the nipple there was a 2 cm oval mass which by ultrasound measured 2.5 cm and was irregular and hypoechoic.  There were also at least 3 abnormal appearing lymph nodes in the right axilla.  Accordingly on 05/02/2017 she proceeded to biopsy of the right breast mass in question and lymph node sampling. The pathology from this procedure showed ([id number]): Invasive ductal carcinoma grade II. One lymph node positive for metastatic carcinoma (1/1). Prognostic indicators significant for: estrogen receptor, 0% negative and progesterone receptor, 0% negative. Proliferation marker Ki67 at 80%. HER2 not amplified with ratios HER2/CEP17 signals 1.29 and average copies per cell 2.25  The patient's subsequent history is as detailed below.   PAST MEDICAL HISTORY: Past Medical History:  Diagnosis Date   Allergic rhinitis    Arthritis    Breast cancer ([place])    Family history of breast cancer    Family history of prostate cancer    Headache    migraines in the past   Hyperlipidemia    Hypertension    PONV (postoperative nausea and vomiting)    after 1st colonoscopy  Migraines in the past.    PAST SURGICAL HISTORY: Past Surgical History:  Procedure Laterality Date   BREAST LUMPECTOMY WITH RADIOACTIVE SEED AND SENTINEL LYMPH NODE BIOPSY Right 08/30/2017   Procedure: RIGHT BREAST RADIOACTIVE SEED GUIDED LUMPECTOMY WITH RIGHT RADIOACTIVE SEED TARGETED AXILLARY LYMPH NODE EXCISION AND RIGHT SENTINEL LYMPH NODE BIOPSY;  Surgeon: Rolm Bookbinder, MD;  Location: [facility];  Service: General;  Laterality: Right;  COLONOSCOPY     FOOT SURGERY Bilateral    FRACTURE SURGERY Left    wrist   PORTACATH PLACEMENT Right 05/19/2017   Procedure: INSERTION PORT-A-CATH WITH ULTRASOUND;  Surgeon:  Rolm Bookbinder, MD;  Location: [facility];  Service: General;  Laterality: Right;   TUBAL LIGATION     WISDOM TOOTH EXTRACTION     Wrist Fracture. Torn Rotator cuff and torn meniscus.    FAMILY HISTORY Family History  Problem Relation Age of Onset   Heart disease Mother    Cancer Father    Diabetes Father    Hypertension Father    Hyperlipidemia Father    Emphysema Father    Heart disease Brother    Hypertension Brother    Hyperlipidemia Brother    Diabetes Brother    Prostate cancer Brother 41   Breast cancer Sister 33   Breast cancer Sister 75   Asthma Sister    Cancer Paternal Aunt        unsure what type of cancer she had   Prostate cancer Paternal Uncle    Diabetes Brother    Gout Brother    Kidney disease Brother    Lung disease Sister    Breast cancer Sister   The patient's father died at age 27 due to heart disease and emphysema. The patient's mother died at age 19 due to heart disease. The patient has 6 brothers and 8 sisters. She notes 2 sisters with breast cancer. The 1st sister was diagnosed at age 60, and the 2nd sister was diagnosed at age 18. She notes that both sisters are alive. She also notes a paternal first cousin with breast cancer diagnosed in her 41's. The patient notes that her father also had bone cancer, but she's unsure if he had myeloma. She denies a family history of ovarian cancer.    GYNECOLOGIC HISTORY:  No LMP recorded. Patient is postmenopausal. 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 She is divorced. She lives by herself with no pets. The patient's daughter, Emily Perez, works in Therapist, art for [employer]. The patient's son, Emily Perez,  works for [employer]. The patient has  2 grandchildren. She belongs to [facility].     ADVANCED DIRECTIVES:  Not in place.  At the 05/19/2017 visit the patient was given the appropriate documents to complete and notarized at her discretion   HEALTH MAINTENANCE: Social History   Tobacco Use   Smoking status: Never   Smokeless tobacco: Never  Vaping Use   Vaping Use: Never used  Substance Use Topics   Alcohol use: No   Drug use: No     Colonoscopy:   PAP: September 2018 normal  Bone density: none   Allergies  Allergen Reactions   Latex Hives and Rash   Penicillin V Other (See Comments)    Current Outpatient Medications  Medication Sig Dispense Refill   aspirin 81 MG tablet Take 81 mg by mouth 3 (three) times a week.      doxylamine, Sleep, (UNISOM) 25 MG tablet Take 50 mg by mouth at bedtime as needed.     meclizine (ANTIVERT) 12.5 MG tablet      Multiple Vitamins-Minerals (MULTIVITAMIN WITH MINERALS) tablet Take 1 tablet by mouth daily.     naproxen sodium (ANAPROX) 550 MG tablet Take 550  mg by mouth daily as needed (pain). Takes 0.5-1 tablet as needed for pain     ondansetron (ZOFRAN-ODT) 8 MG disintegrating tablet      OVER THE COUNTER MEDICATION Apply 1 application topically at bedtime as needed. Pain Relief Balm     polycarbophil (FIBERCON) 625 MG tablet Take 625 mg by mouth as needed.     triamterene-hydrochlorothiazide (DYAZIDE) 37.5-25 MG capsule Take 1 capsule by mouth daily.  3   zaleplon (SONATA) 5 MG capsule TK 1 C PO QD HS PRN  5   No current facility-administered medications for this visit.    OBJECTIVE: African-American woman who appears well  Vitals:   02/19/21 1112  BP: (!) 146/78  Pulse: 87  Resp: 18  Temp: (!) 97.3 F (36.3 C)  SpO2: 97%      Body mass index is 28.86 kg/m.   Wt Readings from Last 3 Encounters:  02/19/21 162 lb 14.4 oz (73.9 kg)  09/01/20 180 lb (81.6 kg)  08/19/20 179 lb 4.8 oz (81.3 kg)   ECOG FS:1 - Symptomatic but completely ambulatory   Sclerae unicteric, EOMs intact Wearing a mask No cervical or supraclavicular  adenopathy Lungs no rales or rhonchi Heart regular rate and rhythm Abd soft, nontender, positive bowel sounds MSK no focal spinal tenderness, no upper extremity lymphedema Neuro: nonfocal, well oriented, appropriate affect Breasts: The right breast is status postlumpectomy and radiation.  There is no evidence of local recurrence.  The left breast is benign.  Both axillae are benign   LAB RESULTS:  CMP     Component Value Date/Time   NA 141 02/19/2021 1101   K 3.8 02/19/2021 1101   CL 102 02/19/2021 1101   CO2 29 02/19/2021 1101   GLUCOSE 80 02/19/2021 1101   BUN 28 (H) 02/19/2021 1101   CREATININE 1.25 (H) 02/19/2021 1101   CALCIUM 9.7 02/19/2021 1101   PROT 7.8 02/19/2021 1101   ALBUMIN 3.7 02/19/2021 1101   AST 21 02/19/2021 1101   ALT 14 02/19/2021 1101   ALKPHOS 72 02/19/2021 1101   BILITOT 0.4 02/19/2021 1101   GFRNONAA 47 (L) 02/19/2021 1101   GFRAA >60 02/22/2019 1239   GFRAA 46 (L) 08/03/2017 1114    Lab Results  Component Value Date   WBC 8.5 02/19/2021   NEUTROABS 5.6 02/19/2021   HGB 11.2 (L) 02/19/2021   HCT 34.9 (L) 02/19/2021   MCV 77.0 (L) 02/19/2021   PLT 230 02/19/2021    No results found for: LABCA2  No components found for: VOPFYT244  No results for input(s): INR in the last 168 hours.  No results found for: LABCA2  No results found for: QKM638  No results found for: TRR116  No results found for: FBX038  No results found for: CA2729  No components found for: HGQUANT  No results found for: CEA1 / No results found for: CEA1   No results found for: AFPTUMOR  No results found for: CHROMOGRNA  No results found for: TOTALPROTELP, ALBUMINELP, A1GS, A2GS, BETS, BETA2SER, GAMS, MSPIKE, SPEI (this displays SPEP labs)  No results found for: KPAFRELGTCHN, LAMBDASER, KAPLAMBRATIO (kappa/lambda light chains)  No results found for: HGBA, HGBA2QUANT, HGBFQUANT, HGBSQUAN (Hemoglobinopathy evaluation)   No results found for: LDH  Lab  Results  Component Value Date   IRON 170 (H) 07/12/2017   TIBC 303 07/12/2017   IRONPCTSAT 56 07/12/2017   (Iron and TIBC)  Lab Results  Component Value Date   FERRITIN 685 (H) 07/12/2017  Urinalysis No results found for: COLORURINE, APPEARANCEUR, LABSPEC, PHURINE, GLUCOSEU, HGBUR, BILIRUBINUR, KETONESUR, PROTEINUR, UROBILINOGEN, NITRITE, LEUKOCYTESUR   STUDIES: No results found.   ELIGIBLE FOR AVAILABLE RESEARCH PROTOCOL: no  ASSESSMENT: 65 y.o. [place] Woman status post right breast upper outer quadrant biopsy 05/02/2017 for a clinical T2 N1-2, stage IIIB invasive ductal carcinoma, grade 2-3, triple negative, with an MIB-1 80%  (a) breast MRI 05/18/2017 shows T3 N1 disease with possible involvement of the internal mammary nodes  (1) neoadjuvant chemotherapy consisting of doxorubicin and cyclophosphamide in dose dense fashion x4 starting 05/24/2017, completed 07/05/2017  (a) planned carboplatin/paclitaxel x12 omitted secondary to peripheral neuropathy concerns  (2) status post right lumpectomy 08/30/2017 showing a complete pathologic response (ypT0 ypN0)  (a) a total of 5 lymph nodes were removed  (3) adjuvant radiation : 10/12/2017-11/28/2017 Site/dose:   1. Right breast, Ax_SCV, 1.8 Gy in 25 fractions for a total dose of 45 Gy.                       2. Right breast, 1.8 Gy in 28 fractions for a total dose of 50.4 Gy                      3. Boost, 2 Gy in 5 fractions for a total dose of 10 Gy  (4) Genetics testing offered through [facility] Multi-cancer Panel on 08/03/2017 showed a pathogenic variant in PALB2 ([id number] (p.Gln60Argfs*7)  (a) there was a VUS identified in CDH1  (b) no additional deleterious mutations were noted in ALK, APC, ATM, AXIN2, BAP1, BARD1, BLM, BMPR1A, BRCA1, BRCA2, BRIP1, CASR, CDC73, CDH1, CDK4, CDKN1B, CDKN1C, CDKN2A (p14ARF), CDKN2A (p16INK4a), CEBPA, CHEK2, CTNNA1, DICER1, DIS3L2, EPCAM*, FH, FLCN, GATA2, GPC3, GREM1*, HRAS, KIT, MAX, MEN1,  MET, MLH1, MSH2, MSH3, MSH6, MUTYH, NBN, NF1, NF2, PALB2, PDGFRA, PHOX2B*, PMS2, POLD1, POLE, POT1, PRKAR1A, PTCH1, PTEN, RAD50, RAD51C, RAD51D, RB1, RECQL4, RET, RUNX1, SDHAF2, SDHB, SDHC, SDHD, SMAD4, SMARCA4, SMARCB1, SMARCE1, STK11, SUFU, TERC, TERT, TMEM127, TP53, TSC1, TSC2, VHL, WRN*, WT1. The following genes were evaluated for sequence changes only: EGFR*, HOXB13*, MITF*, NTHL1*, SDHA Results are negative unless otherwise indicated  (c) per NCCN guidelines, there is insufficient data to associate PALB2 mutations with increased ovarian cancer risk; this is managed according to family history  (5) PALB2: intensified screening:  (a) mammography with tomography every November and see Dr. Donne Hazel December  (b) breast MRI every May and see me in June  (c) family is aware and "those who wish to be tested have been tested"  (6) PALB2: breast cancer prophylaxis:   (a) anastrozole October 2019, discontinued after a few weeks secondary to hair loss and other symptoms.  (7) thalassemia: With MCV of 76.5 and ferritin 685 on April 2019   PLAN: Emily Perez is now 3-1/2 years out from definitive surgery for her breast cancer with no evidence of disease activity.  This is very favorable.  We are continuing intensified screening and she will have her breast MRI in May.  She will see Dr. Donne Hazel shortly after that.  She will then have her mammogram late November 2023 and see [place] shortly after that.  I commended her new diet and exercise program.  She knows to call for any other issues that may develop before her next visit.  Total encounter time 20 minutes.*   Santi Troung, Virgie Dad, MD  02/19/21 11:32 AM Medical Oncology and Hematology [facility] [place], [place] [address] Tel. [phone]  Fax. [phone]   I, Wilburn Mylar, am acting as scribe for Dr. Virgie Dad. Alisyn Lequire.  I, Lurline Del MD, have reviewed the above documentation for accuracy and  completeness, and I agree with the above.   *Total Encounter Time as defined by the Centers for Medicare and Medicaid Services includes, in addition to the face-to-face time of a patient visit (documented in the note above) non-face-to-face time: obtaining and reviewing outside history, ordering and reviewing medications, tests or procedures, care coordination (communications with other health care professionals or caregivers) and documentation in the medical record.

## 2021-02-19 ENCOUNTER — Other Ambulatory Visit: Payer: Self-pay

## 2021-02-19 ENCOUNTER — Inpatient Hospital Stay (HOSPITAL_BASED_OUTPATIENT_CLINIC_OR_DEPARTMENT_OTHER): Payer: Medicare HMO | Admitting: Oncology

## 2021-02-19 ENCOUNTER — Telehealth: Payer: Self-pay | Admitting: Oncology

## 2021-02-19 ENCOUNTER — Inpatient Hospital Stay: Payer: Medicare HMO | Attending: Oncology

## 2021-02-19 VITALS — BP 146/78 | HR 87 | Temp 97.3°F | Resp 18 | Wt 162.9 lb

## 2021-02-19 DIAGNOSIS — C50511 Malignant neoplasm of lower-outer quadrant of right female breast: Secondary | ICD-10-CM | POA: Diagnosis not present

## 2021-02-19 DIAGNOSIS — Z8249 Family history of ischemic heart disease and other diseases of the circulatory system: Secondary | ICD-10-CM | POA: Diagnosis not present

## 2021-02-19 DIAGNOSIS — G629 Polyneuropathy, unspecified: Secondary | ICD-10-CM | POA: Insufficient documentation

## 2021-02-19 DIAGNOSIS — Z803 Family history of malignant neoplasm of breast: Secondary | ICD-10-CM | POA: Insufficient documentation

## 2021-02-19 DIAGNOSIS — C50919 Malignant neoplasm of unspecified site of unspecified female breast: Secondary | ICD-10-CM

## 2021-02-19 DIAGNOSIS — Z833 Family history of diabetes mellitus: Secondary | ICD-10-CM | POA: Insufficient documentation

## 2021-02-19 DIAGNOSIS — Z79899 Other long term (current) drug therapy: Secondary | ICD-10-CM | POA: Diagnosis not present

## 2021-02-19 DIAGNOSIS — Z880 Allergy status to penicillin: Secondary | ICD-10-CM | POA: Insufficient documentation

## 2021-02-19 DIAGNOSIS — D569 Thalassemia, unspecified: Secondary | ICD-10-CM | POA: Insufficient documentation

## 2021-02-19 DIAGNOSIS — Z836 Family history of other diseases of the respiratory system: Secondary | ICD-10-CM | POA: Diagnosis not present

## 2021-02-19 DIAGNOSIS — C50811 Malignant neoplasm of overlapping sites of right female breast: Secondary | ICD-10-CM | POA: Diagnosis not present

## 2021-02-19 DIAGNOSIS — Z8042 Family history of malignant neoplasm of prostate: Secondary | ICD-10-CM | POA: Diagnosis not present

## 2021-02-19 DIAGNOSIS — Z809 Family history of malignant neoplasm, unspecified: Secondary | ICD-10-CM | POA: Diagnosis not present

## 2021-02-19 DIAGNOSIS — D563 Thalassemia minor: Secondary | ICD-10-CM

## 2021-02-19 DIAGNOSIS — Z841 Family history of disorders of kidney and ureter: Secondary | ICD-10-CM | POA: Diagnosis not present

## 2021-02-19 DIAGNOSIS — Z171 Estrogen receptor negative status [ER-]: Secondary | ICD-10-CM | POA: Insufficient documentation

## 2021-02-19 DIAGNOSIS — Z8349 Family history of other endocrine, nutritional and metabolic diseases: Secondary | ICD-10-CM | POA: Diagnosis not present

## 2021-02-19 LAB — CBC WITH DIFFERENTIAL (CANCER CENTER ONLY)
Abs Immature Granulocytes: 0.01 10*3/uL (ref 0.00–0.07)
Basophils Absolute: 0.1 10*3/uL (ref 0.0–0.1)
Basophils Relative: 1 %
Eosinophils Absolute: 0.3 10*3/uL (ref 0.0–0.5)
Eosinophils Relative: 4 %
HCT: 34.9 % — ABNORMAL LOW (ref 36.0–46.0)
Hemoglobin: 11.2 g/dL — ABNORMAL LOW (ref 12.0–15.0)
Immature Granulocytes: 0 %
Lymphocytes Relative: 20 %
Lymphs Abs: 1.7 10*3/uL (ref 0.7–4.0)
MCH: 24.7 pg — ABNORMAL LOW (ref 26.0–34.0)
MCHC: 32.1 g/dL (ref 30.0–36.0)
MCV: 77 fL — ABNORMAL LOW (ref 80.0–100.0)
Monocytes Absolute: 0.9 10*3/uL (ref 0.1–1.0)
Monocytes Relative: 10 %
Neutro Abs: 5.6 10*3/uL (ref 1.7–7.7)
Neutrophils Relative %: 65 %
Platelet Count: 230 10*3/uL (ref 150–400)
RBC: 4.53 MIL/uL (ref 3.87–5.11)
RDW: 15.3 % (ref 11.5–15.5)
WBC Count: 8.5 10*3/uL (ref 4.0–10.5)
nRBC: 0 % (ref 0.0–0.2)

## 2021-02-19 LAB — CMP (CANCER CENTER ONLY)
ALT: 14 U/L (ref 0–44)
AST: 21 U/L (ref 15–41)
Albumin: 3.7 g/dL (ref 3.5–5.0)
Alkaline Phosphatase: 72 U/L (ref 38–126)
Anion gap: 10 (ref 5–15)
BUN: 28 mg/dL — ABNORMAL HIGH (ref 8–23)
CO2: 29 mmol/L (ref 22–32)
Calcium: 9.7 mg/dL (ref 8.9–10.3)
Chloride: 102 mmol/L (ref 98–111)
Creatinine: 1.25 mg/dL — ABNORMAL HIGH (ref 0.44–1.00)
GFR, Estimated: 47 mL/min — ABNORMAL LOW (ref >60)
Glucose, Bld: 80 mg/dL (ref 70–99)
Potassium: 3.8 mmol/L (ref 3.5–5.1)
Sodium: 141 mmol/L (ref 135–145)
Total Bilirubin: 0.4 mg/dL (ref 0.3–1.2)
Total Protein: 7.8 g/dL (ref 6.5–8.1)

## 2021-02-19 NOTE — Telephone Encounter (Signed)
Scheduled appointment per 12/01 los. Patient is aware.

## 2021-05-05 DIAGNOSIS — H02881 Meibomian gland dysfunction right upper eyelid: Secondary | ICD-10-CM | POA: Diagnosis not present

## 2021-06-18 DIAGNOSIS — N1831 Chronic kidney disease, stage 3a: Secondary | ICD-10-CM | POA: Diagnosis not present

## 2021-06-18 DIAGNOSIS — R69 Illness, unspecified: Secondary | ICD-10-CM | POA: Diagnosis not present

## 2021-06-18 DIAGNOSIS — I10 Essential (primary) hypertension: Secondary | ICD-10-CM | POA: Diagnosis not present

## 2021-06-18 DIAGNOSIS — R7303 Prediabetes: Secondary | ICD-10-CM | POA: Diagnosis not present

## 2021-06-18 DIAGNOSIS — D508 Other iron deficiency anemias: Secondary | ICD-10-CM | POA: Diagnosis not present

## 2021-06-18 DIAGNOSIS — Z853 Personal history of malignant neoplasm of breast: Secondary | ICD-10-CM | POA: Diagnosis not present

## 2021-06-18 DIAGNOSIS — D569 Thalassemia, unspecified: Secondary | ICD-10-CM | POA: Diagnosis not present

## 2021-06-23 ENCOUNTER — Other Ambulatory Visit: Payer: Self-pay | Admitting: Hematology and Oncology

## 2021-06-23 DIAGNOSIS — Z171 Estrogen receptor negative status [ER-]: Secondary | ICD-10-CM

## 2021-06-23 DIAGNOSIS — C50919 Malignant neoplasm of unspecified site of unspecified female breast: Secondary | ICD-10-CM

## 2021-06-23 DIAGNOSIS — C50511 Malignant neoplasm of lower-outer quadrant of right female breast: Secondary | ICD-10-CM

## 2021-07-22 ENCOUNTER — Ambulatory Visit
Admission: RE | Admit: 2021-07-22 | Discharge: 2021-07-22 | Disposition: A | Discharge status: Home / Self Care | Payer: Medicare HMO | Source: Ambulatory Visit | Attending: Hematology and Oncology | Admitting: Hematology and Oncology

## 2021-07-22 DIAGNOSIS — C50511 Malignant neoplasm of lower-outer quadrant of right female breast: Secondary | ICD-10-CM

## 2021-07-22 DIAGNOSIS — Z803 Family history of malignant neoplasm of breast: Secondary | ICD-10-CM | POA: Diagnosis not present

## 2021-07-22 DIAGNOSIS — C50919 Malignant neoplasm of unspecified site of unspecified female breast: Secondary | ICD-10-CM

## 2021-07-22 MED ORDER — GADOBUTROL 1 MMOL/ML IV SOLN
7.0000 mL | Freq: Once | INTRAVENOUS | Status: AC | PRN
  Administered 2021-07-22: 7 mL via INTRAVENOUS

## 2021-07-23 ENCOUNTER — Other Ambulatory Visit: Payer: Self-pay | Admitting: Hematology and Oncology

## 2021-07-23 DIAGNOSIS — R9389 Abnormal findings on diagnostic imaging of other specified body structures: Secondary | ICD-10-CM

## 2021-08-03 ENCOUNTER — Ambulatory Visit
Admission: RE | Admit: 2021-08-03 | Discharge: 2021-08-03 | Disposition: A | Discharge status: Home / Self Care | Payer: Medicare HMO | Source: Ambulatory Visit | Attending: Hematology and Oncology | Admitting: Hematology and Oncology

## 2021-08-03 ENCOUNTER — Ambulatory Visit
Admission: RE | Admit: 2021-08-03 | Discharge: 2021-08-03 | Disposition: A | Discharge status: Home / Self Care | Payer: BC Managed Care – PPO | Source: Ambulatory Visit | Attending: Hematology and Oncology | Admitting: Hematology and Oncology

## 2021-08-03 DIAGNOSIS — R9389 Abnormal findings on diagnostic imaging of other specified body structures: Secondary | ICD-10-CM

## 2021-08-03 DIAGNOSIS — Z803 Family history of malignant neoplasm of breast: Secondary | ICD-10-CM

## 2021-08-03 DIAGNOSIS — N641 Fat necrosis of breast: Secondary | ICD-10-CM | POA: Diagnosis not present

## 2021-08-03 DIAGNOSIS — Z853 Personal history of malignant neoplasm of breast: Secondary | ICD-10-CM | POA: Diagnosis not present

## 2021-08-03 DIAGNOSIS — N6312 Unspecified lump in the right breast, upper inner quadrant: Secondary | ICD-10-CM | POA: Diagnosis not present

## 2021-08-03 MED ORDER — GADOBUTROL 1 MMOL/ML IV SOLN
8.0000 mL | Freq: Once | INTRAVENOUS | Status: AC | PRN
  Administered 2021-08-03: 8 mL via INTRAVENOUS

## 2021-08-04 ENCOUNTER — Telehealth: Payer: Self-pay

## 2021-08-04 DIAGNOSIS — Z7982 Long term (current) use of aspirin: Secondary | ICD-10-CM | POA: Diagnosis not present

## 2021-08-04 DIAGNOSIS — Z008 Encounter for other general examination: Secondary | ICD-10-CM | POA: Diagnosis not present

## 2021-08-04 DIAGNOSIS — Z803 Family history of malignant neoplasm of breast: Secondary | ICD-10-CM | POA: Diagnosis not present

## 2021-08-04 DIAGNOSIS — G4700 Insomnia, unspecified: Secondary | ICD-10-CM | POA: Diagnosis not present

## 2021-08-04 DIAGNOSIS — Z833 Family history of diabetes mellitus: Secondary | ICD-10-CM | POA: Diagnosis not present

## 2021-08-04 DIAGNOSIS — Z853 Personal history of malignant neoplasm of breast: Secondary | ICD-10-CM | POA: Diagnosis not present

## 2021-08-04 DIAGNOSIS — I10 Essential (primary) hypertension: Secondary | ICD-10-CM | POA: Diagnosis not present

## 2021-08-04 DIAGNOSIS — Z8249 Family history of ischemic heart disease and other diseases of the circulatory system: Secondary | ICD-10-CM | POA: Diagnosis not present

## 2021-08-04 NOTE — Telephone Encounter (Signed)
Pt called and requests results from MRI guided bx. Returned pt's call and she states she received a call from radiologist. Pt requests appt to see Dr Chryl Heck within a couple of weeks to discuss screenings. Message sent to schedulers to schedule pt, she is aware and verbalized thanks.  ?

## 2021-08-05 ENCOUNTER — Telehealth: Payer: Self-pay | Admitting: Hematology and Oncology

## 2021-08-05 NOTE — Telephone Encounter (Signed)
Scheduled appointment per provider request. Patient aware.  ?

## 2021-08-07 DIAGNOSIS — L500 Allergic urticaria: Secondary | ICD-10-CM | POA: Diagnosis not present

## 2021-08-13 ENCOUNTER — Inpatient Hospital Stay: Payer: Medicare HMO | Attending: Hematology and Oncology | Admitting: Hematology and Oncology

## 2021-08-13 ENCOUNTER — Other Ambulatory Visit: Payer: Self-pay

## 2021-08-13 VITALS — BP 136/80 | HR 102 | Temp 97.7°F | Resp 16 | Ht 63.0 in | Wt 156.2 lb

## 2021-08-13 DIAGNOSIS — Z171 Estrogen receptor negative status [ER-]: Secondary | ICD-10-CM | POA: Insufficient documentation

## 2021-08-13 DIAGNOSIS — Z833 Family history of diabetes mellitus: Secondary | ICD-10-CM | POA: Insufficient documentation

## 2021-08-13 DIAGNOSIS — C50919 Malignant neoplasm of unspecified site of unspecified female breast: Secondary | ICD-10-CM | POA: Diagnosis not present

## 2021-08-13 DIAGNOSIS — C50511 Malignant neoplasm of lower-outer quadrant of right female breast: Secondary | ICD-10-CM | POA: Insufficient documentation

## 2021-08-13 DIAGNOSIS — Z923 Personal history of irradiation: Secondary | ICD-10-CM | POA: Diagnosis not present

## 2021-08-13 DIAGNOSIS — D569 Thalassemia, unspecified: Secondary | ICD-10-CM | POA: Diagnosis not present

## 2021-08-13 DIAGNOSIS — Z8042 Family history of malignant neoplasm of prostate: Secondary | ICD-10-CM | POA: Diagnosis not present

## 2021-08-13 DIAGNOSIS — C779 Secondary and unspecified malignant neoplasm of lymph node, unspecified: Secondary | ICD-10-CM | POA: Diagnosis not present

## 2021-08-13 DIAGNOSIS — Z8349 Family history of other endocrine, nutritional and metabolic diseases: Secondary | ICD-10-CM | POA: Diagnosis not present

## 2021-08-13 DIAGNOSIS — Z841 Family history of disorders of kidney and ureter: Secondary | ICD-10-CM | POA: Diagnosis not present

## 2021-08-13 DIAGNOSIS — Z880 Allergy status to penicillin: Secondary | ICD-10-CM | POA: Insufficient documentation

## 2021-08-13 DIAGNOSIS — Z8249 Family history of ischemic heart disease and other diseases of the circulatory system: Secondary | ICD-10-CM | POA: Insufficient documentation

## 2021-08-13 DIAGNOSIS — Z803 Family history of malignant neoplasm of breast: Secondary | ICD-10-CM | POA: Insufficient documentation

## 2021-08-13 DIAGNOSIS — Z79899 Other long term (current) drug therapy: Secondary | ICD-10-CM | POA: Insufficient documentation

## 2021-08-13 DIAGNOSIS — Z809 Family history of malignant neoplasm, unspecified: Secondary | ICD-10-CM | POA: Insufficient documentation

## 2021-08-13 DIAGNOSIS — Z836 Family history of other diseases of the respiratory system: Secondary | ICD-10-CM | POA: Insufficient documentation

## 2021-08-13 DIAGNOSIS — Z9221 Personal history of antineoplastic chemotherapy: Secondary | ICD-10-CM | POA: Diagnosis not present

## 2021-08-13 DIAGNOSIS — N6312 Unspecified lump in the right breast, upper inner quadrant: Secondary | ICD-10-CM | POA: Diagnosis not present

## 2021-08-13 NOTE — Progress Notes (Signed)
[place]  Telephone:([phone] Fax:([phone]) [phone]     ID: ANY MCNEICE DOB: [birth year]  MR#: [id number]  LKT#:[id number]  Patient Care Team: Shirline Frees, MD as PCP - General (Family Medicine) Magrinat, Virgie Dad, MD (Inactive) as Consulting Physician (Oncology) Rolm Bookbinder, MD as Consulting Physician (General Surgery) Gery Pray, MD as Consulting Physician (Radiation Oncology) Allyn Kenner, MD (Dermatology) Thurnell Lose, MD as Consulting Physician (Obstetrics and Gynecology) Marchia Bond, MD as Consulting Physician (Orthopedic Surgery) Christene Slates, MD as Physician Assistant (Radiology) Margaretha Seeds, MD as Consulting Physician (Pulmonary Disease) Brand Males, MD as Consulting Physician (Pulmonary Disease) Alda Berthold, DO as Consulting Physician (Neurology) OTHER MD:   CHIEF COMPLAINT: Triple negative breast cancer; PALB2 mutation  CURRENT TREATMENT: intensified screening   INTERVAL HISTORY: Emily Perez returns today for follow up of her triple negative breast cancer.  She is under intensified screening for her PALB2 mutation Since her last visit she underwent MRI breast on 07/22/2021 which showed suspicious 10 mm enhancement in the right breast lumpectomy site.  MRI guided biopsy done and this revealed benign breast parenchyma with focal fat necrosis She is understandably worried about the possible recurrence of her breast cancer.  She otherwise has not noticed any changes in her health.  She was hoping we can help her with insurance quickly about not covering the MRI.  She denies any changes in breathing, bowel habits or urinary habits.  No unintentional weight loss.  No back pain or sudden worsening of diabetes.  Rest of the pertinent 10 point ROS reviewed and negative   REVIEW OF SYSTEMS: Emily Perez has gained a little weight and this concerns her.  She is going to the gym for 5 times a week.  She runs errands and visits with friends.   She tells me she has a good diet.  Generally feels she feels fine.  Her only complaint is sleeping.  She has difficulty falling asleep and staying asleep.  This is however not a new problem.  A detailed review of systems was otherwise stable.   COVID 19 VACCINATION STATUS: Status post Modernax2, with booster November 2021   HISTORY OF CURRENT ILLNESS: From the original intake note:  Emily Perez had routine bilateral screening mammography at [facility] on 04/19/2017 showing a possible abnormality in the right breast. She underwent unilateral right diagnostic mammography with tomography and right breast ultrasonography at [facility] on 04/25/2017 showing: breast density category B.  In the right breast at the 6:00 radiant 2 cm from the nipple there was a 2 cm oval mass which by ultrasound measured 2.5 cm and was irregular and hypoechoic.  There were also at least 3 abnormal appearing lymph nodes in the right axilla.  Accordingly on 05/02/2017 she proceeded to biopsy of the right breast mass in question and lymph node sampling. The pathology from this procedure showed ([id number]): Invasive ductal carcinoma grade II. One lymph node positive for metastatic carcinoma (1/1). Prognostic indicators significant for: estrogen receptor, 0% negative and progesterone receptor, 0% negative. Proliferation marker Ki67 at 80%. HER2 not amplified with ratios HER2/CEP17 signals 1.29 and average copies per cell 2.25  The patient's subsequent history is as detailed below.   PAST MEDICAL HISTORY: Past Medical History:  Diagnosis Date   Allergic rhinitis    Arthritis    Breast cancer ([place])    Family history of breast cancer    Family history of prostate cancer    Headache    migraines in the past  Hyperlipidemia    Hypertension    PONV (postoperative nausea and vomiting)    after 1st colonoscopy    PAST SURGICAL HISTORY: Past Surgical History:  Procedure Laterality Date   BREAST LUMPECTOMY WITH RADIOACTIVE  SEED AND SENTINEL LYMPH NODE BIOPSY Right 08/30/2017   Procedure: RIGHT BREAST RADIOACTIVE SEED GUIDED LUMPECTOMY WITH RIGHT RADIOACTIVE SEED TARGETED AXILLARY LYMPH NODE EXCISION AND RIGHT SENTINEL LYMPH NODE BIOPSY;  Surgeon: Rolm Bookbinder, MD;  Location: [place];  Service: General;  Laterality: Right;   COLONOSCOPY     FOOT SURGERY Bilateral    FRACTURE SURGERY Left    wrist   PORTACATH PLACEMENT Right 05/19/2017   Procedure: INSERTION PORT-A-CATH WITH ULTRASOUND;  Surgeon: Rolm Bookbinder, MD;  Location: [place];  Service: General;  Laterality: Right;   TUBAL LIGATION     WISDOM TOOTH EXTRACTION     Wrist Fracture. Torn Rotator cuff and torn meniscus.    FAMILY HISTORY Family History  Problem Relation Age of Onset   Heart disease Mother    Cancer Father    Diabetes Father    Hypertension Father    Hyperlipidemia Father    Emphysema Father    Heart disease Brother    Hypertension Brother    Hyperlipidemia Brother    Diabetes Brother    Prostate cancer Brother 75   Breast cancer Sister 15   Breast cancer Sister 40   Asthma Sister    Cancer Paternal Aunt        unsure what type of cancer she had   Prostate cancer Paternal Uncle    Diabetes Brother    Gout Brother    Kidney disease Brother    Lung disease Sister    Breast cancer Sister   The patient's father died at age 17 due to heart disease and emphysema. The patient's mother died at age 35 due to heart disease. The patient has 6 brothers and 8 sisters. She notes 2 sisters with breast cancer. The 1st sister was diagnosed at age 63, and the 2nd sister was diagnosed at age 49. She notes that both sisters are alive. She also notes a paternal first cousin with breast cancer diagnosed in her 59's. The patient notes that her father also had bone cancer, but she's unsure if he had myeloma. She denies a family history of ovarian cancer.    GYNECOLOGIC HISTORY:  No LMP recorded. Patient is postmenopausal. 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 She is divorced. She lives by herself with no pets. The patient's daughter, [name], works in Therapist, art for [employer]. The patient's son, [name],  works for [employer]. The patient has  2 grandchildren. She belongs to [facility].     ADVANCED DIRECTIVES: Not in place.  At the 05/19/2017 visit the patient was given the appropriate documents to complete and notarized at her discretion   HEALTH MAINTENANCE: Social History   Tobacco Use   Smoking status: Never   Smokeless tobacco: Never  Vaping Use   Vaping Use: Never used  Substance Use Topics   Alcohol use: No   Drug use: No     Colonoscopy:   PAP: September 2018 normal  Bone density: none   Allergies  Allergen Reactions   Latex Hives and Rash   Penicillin V Other (  See Comments)    Current Outpatient Medications  Medication Sig Dispense Refill   aspirin 81 MG tablet Take 81 mg by mouth 3 (three) times a week.      doxylamine, Sleep, (UNISOM) 25 MG tablet Take 50 mg by mouth at bedtime as needed.     meclizine (ANTIVERT) 12.5 MG tablet      Multiple Vitamins-Minerals (MULTIVITAMIN WITH MINERALS) tablet Take 1 tablet by mouth daily.     naproxen sodium (ANAPROX) 550 MG tablet Take 550 mg by mouth daily as needed (pain). Takes 0.5-1 tablet as needed for pain     ondansetron (ZOFRAN-ODT) 8 MG disintegrating tablet      OVER THE COUNTER MEDICATION Apply 1 application topically at bedtime as needed. Pain Relief Balm     polycarbophil (FIBERCON) 625 MG tablet Take 625 mg by mouth as needed.     triamterene-hydrochlorothiazide (DYAZIDE) 37.5-25 MG capsule Take 1 capsule by mouth daily.  3   zaleplon (SONATA) 5 MG capsule TK 1 C PO QD HS PRN  5   No current facility-administered medications for this  visit.    OBJECTIVE: African-American woman who appears well  There were no vitals filed for this visit.    There is no height or weight on file to calculate BMI.   Wt Readings from Last 3 Encounters:  02/19/21 162 lb 14.4 oz (73.9 kg)  09/01/20 180 lb (81.6 kg)  08/19/20 179 lb 4.8 oz (81.3 kg)   ECOG FS:1 - Symptomatic but completely ambulatory   Physical Exam Constitutional:      Appearance: Normal appearance.  Cardiovascular:     Rate and Rhythm: Normal rate and regular rhythm.  Chest:     Comments: Bilateral breasts examined. No palpable masses or regional adenopathy Musculoskeletal:        General: No swelling or tenderness.     Cervical back: Normal range of motion and neck supple. No rigidity.  Lymphadenopathy:     Cervical: No cervical adenopathy.  Skin:    General: Skin is warm and dry.  Neurological:     General: No focal deficit present.     Mental Status: She is alert.  Psychiatric:        Mood and Affect: Mood normal.    LAB RESULTS:  CMP     Component Value Date/Time   NA 141 02/19/2021 1101   K 3.8 02/19/2021 1101   CL 102 02/19/2021 1101   CO2 29 02/19/2021 1101   GLUCOSE 80 02/19/2021 1101   BUN 28 (H) 02/19/2021 1101   CREATININE 1.25 (H) 02/19/2021 1101   CALCIUM 9.7 02/19/2021 1101   PROT 7.8 02/19/2021 1101   ALBUMIN 3.7 02/19/2021 1101   AST 21 02/19/2021 1101   ALT 14 02/19/2021 1101   ALKPHOS 72 02/19/2021 1101   BILITOT 0.4 02/19/2021 1101   GFRNONAA 47 (L) 02/19/2021 1101   GFRAA >60 02/22/2019 1239   GFRAA 46 (L) 08/03/2017 1114    Lab Results  Component Value Date   WBC 8.5 02/19/2021   NEUTROABS 5.6 02/19/2021   HGB 11.2 (L) 02/19/2021   HCT 34.9 (L) 02/19/2021   MCV 77.0 (L) 02/19/2021   PLT 230 02/19/2021    No results found for: LABCA2  No components found for: TOIZTI458  No results for input(s): INR in the last 168 hours.  No results found for: LABCA2  No results found for: KDX833  No results found for:  ASN053  No results found for: ZJQ734  No results found for: CA2729  No components found for: HGQUANT  No results found for: CEA1 / No results found for: CEA1   No results found for: AFPTUMOR  No results found for: CHROMOGRNA  No results found for: TOTALPROTELP, ALBUMINELP, A1GS, A2GS, BETS, BETA2SER, GAMS, MSPIKE, SPEI (this displays SPEP labs)  No results found for: KPAFRELGTCHN, LAMBDASER, KAPLAMBRATIO (kappa/lambda light chains)  No results found for: HGBA, HGBA2QUANT, HGBFQUANT, HGBSQUAN (Hemoglobinopathy evaluation)   No results found for: LDH  Lab Results  Component Value Date   IRON 170 (H) 07/12/2017   TIBC 303 07/12/2017   IRONPCTSAT 56 07/12/2017   (Iron and TIBC)  Lab Results  Component Value Date   FERRITIN 685 (H) 07/12/2017    Urinalysis No results found for: COLORURINE, APPEARANCEUR, LABSPEC, PHURINE, GLUCOSEU, HGBUR, BILIRUBINUR, KETONESUR, PROTEINUR, UROBILINOGEN, NITRITE, LEUKOCYTESUR   STUDIES: MR BREAST BILATERAL W WO CONTRAST INC CAD  Addendum Date: 07/22/2021   ADDENDUM REPORT: 07/22/2021 12:31 ADDENDUM: Comparison was made with prior mammograms and MRI's. Electronically Signed   By: Lillia Mountain M.D.   On: 07/22/2021 12:31   Result Date: 07/22/2021 CLINICAL DATA:  History of triple negative right breast cancer status post lumpectomy in 2019 with subsequent neoadjuvant chemotherapy and radiation therapy. Family history of breast cancer in 2 sisters. EXAM: BILATERAL BREAST MRI WITH AND WITHOUT CONTRAST TECHNIQUE: Multiplanar, multisequence MR images of both breasts were obtained prior to and following the intravenous administration of 7 ml of Gadavist Three-dimensional MR images were rendered by post-processing of the original MR data on an independent workstation. The three-dimensional MR images were interpreted, and findings are reported in the following complete MRI report for this study. Three dimensional images were evaluated at the independent  interpreting workstation using the DynaCAD thin client. COMPARISON:  None Available. FINDINGS: Breast composition: b. Scattered fibroglandular tissue. Background parenchymal enhancement: Mild Right breast: Lumpectomy changes are seen in the right breast. There is abnormal non masslike enhancement in the central aspect of the breast in the lumpectomy site measuring 10 x 7 x 8 mm (series 6, image 77). Left breast: No mass or abnormal enhancement. Lymph nodes: No abnormal appearing lymph nodes. Ancillary findings:  None. IMPRESSION: Suspicious 10 mm enhancement in the right breast lumpectomy site. RECOMMENDATION: MR guided core biopsy of the abnormal enhancement in the right breast is recommended. BI-RADS CATEGORY  4: Suspicious. Electronically Signed: By: Lillia Mountain M.D. On: 07/22/2021 11:56  MM CLIP PLACEMENT RIGHT  Result Date: 08/03/2021 CLINICAL DATA:  Status post MR guided core biopsy of enhancing mass in the RIGHT breast. EXAM: 3D DIAGNOSTIC RIGHT MAMMOGRAM POST ULTRASOUND BIOPSY COMPARISON:  Previous exam(s). FINDINGS: 3D Mammographic images were obtained following MRI guided biopsy of enhancement just posterior to a surgical clip in the central portion of the RIGHT breast and placement of a barbell shaped clip. The biopsy marking clip is 4.2 centimeters MEDIAL to the biopsy site, secondary to accordion effect. IMPRESSION: Barbell shaped clip is 4.2 centimeters MEDIAL to the biopsy site. Final Assessment: Post Procedure Mammograms for Marker Placement Electronically Signed   By: Nolon Nations M.D.   On: 08/03/2021 10:09  MR RT BREAST BX W LOC DEV 1ST LESION IMAGE BX SPEC MR GUIDE  Addendum Date: 08/06/2021   ADDENDUM REPORT: 08/06/2021 09:24 ADDENDUM: Pathology revealed BENIGN BREAST PARENCHYMA WITH FOCAL FAT NECROSIS of the RIGHT breast, central, (barbell clip). This was found to be concordant by Dr. Nolon Nations. Pathology results were discussed with the patient by telephone. The patient  reported doing well after the biopsy with tenderness and bleeding at the site. Post biopsy instructions and care were reviewed and questions were answered. The patient was encouraged to call [facility] for any additional concerns. My direct phone number was provided. The patient was instructed to return for a bilateral breast MRI in 6 months, per protocol. Medical oncology consultation has been arranged with Dr. Staci Acosta at [facility] on Aug 13, 2021, per patient request. Pathology results reported by Terie Purser, RN on 08/06/2021. Electronically Signed   By: Nolon Nations M.D.   On: 08/06/2021 09:24   Result Date: 08/06/2021 CLINICAL DATA:  Patient presents for MR guided core biopsy enhancement in the central RIGHT breast at lumpectomy site. History of RIGHT breast cancer with lumpectomy, radiation therapy, and chemotherapy in 2019. EXAM: MRI GUIDED CORE NEEDLE BIOPSY OF THE RIGHT BREAST TECHNIQUE: Multiplanar, multisequence MR imaging of the RIGHT breast was performed both before and after administration of intravenous contrast. CONTRAST:  57m GADAVIST GADOBUTROL 1 MMOL/ML IV SOLN COMPARISON:  Prior study FINDINGS: I met with the patient, and we discussed the procedure of MRI guided biopsy, including risks, benefits, and alternatives. Specifically, we discussed the risks of infection, bleeding, tissue injury, clip migration, and inadequate sampling. Informed, written consent was given. The usual time out protocol was performed immediately prior to the procedure. Using sterile technique, 1% Lidocaine, MRI guidance, and a 9 gauge vacuum assisted device, biopsy was performed of enhancing mass in the UPPER INNER QUADRANT of the RIGHT breast using a LATERAL to MEDIAL approach. At the conclusion of the procedure, a barbell shaped tissue marker clip was deployed into the biopsy cavity. Follow-up 2-view mammogram was performed and dictated separately. IMPRESSION: MRI  guided biopsy of enhancing RIGHT breast mass. No apparent complications. Electronically Signed: By: ENolon NationsM.D. On: 08/03/2021 10:02    ELIGIBLE FOR AVAILABLE RESEARCH PROTOCOL: no  ASSESSMENT: 66y.o. [place] Woman status post right breast upper outer quadrant biopsy 05/02/2017 for a clinical T2 N1-2, stage IIIB invasive ductal carcinoma, grade 2-3, triple negative, with an MIB-1 80%  (a) breast MRI 05/18/2017 shows T3 N1 disease with possible involvement of the internal mammary nodes  (1) neoadjuvant chemotherapy consisting of doxorubicin and cyclophosphamide in dose dense fashion x4 starting 05/24/2017, completed 07/05/2017  (a) planned carboplatin/paclitaxel x12 omitted secondary to peripheral neuropathy concerns  (2) status post right lumpectomy 08/30/2017 showing a complete pathologic response (ypT0 ypN0)  (a) a total of 5 lymph nodes were removed  (3) adjuvant radiation : 10/12/2017-11/28/2017 Site/dose:   1. Right breast, Ax_SCV, 1.8 Gy in 25 fractions for a total dose of 45 Gy.                       2. Right breast, 1.8 Gy in 28 fractions for a total dose of 50.4 Gy                      3. Boost, 2 Gy in 5 fractions for a total dose of 10 Gy  (4) Genetics testing offered through [facility] Multi-cancer Panel on 08/03/2017 showed a pathogenic variant in PALB2 ([id number] (p.Gln60Argfs*7)  (a) there was a VUS identified in CDH1  (b) no additional deleterious mutations were noted in ALK, APC, ATM, AXIN2, BAP1, BARD1, BLM, BMPR1A, BRCA1, BRCA2, BRIP1, CASR, CDC73, CDH1, CDK4, CDKN1B, CDKN1C, CDKN2A (p14ARF), CDKN2A (p16INK4a), CEBPA, CHEK2, CTNNA1, DICER1, DIS3L2, EPCAM*, FH, FLCN, GATA2, GPC3, GREM1*,  HRAS, KIT, MAX, MEN1, MET, MLH1, MSH2, MSH3, MSH6, MUTYH, NBN, NF1, NF2, PALB2, PDGFRA, PHOX2B*, PMS2, POLD1, POLE, POT1, PRKAR1A, PTCH1, PTEN, RAD50, RAD51C, RAD51D, RB1, RECQL4, RET, RUNX1, SDHAF2, SDHB, SDHC, SDHD, SMAD4, SMARCA4, SMARCB1, SMARCE1, STK11, SUFU, TERC, TERT,  TMEM127, TP53, TSC1, TSC2, VHL, WRN*, WT1. The following genes were evaluated for sequence changes only: EGFR*, HOXB13*, MITF*, NTHL1*, SDHA Results are negative unless otherwise indicated  (c) per NCCN guidelines, there is insufficient data to associate PALB2 mutations with increased ovarian cancer risk; this is managed according to family history  (5) PALB2: intensified screening:  (a) mammography with tomography every November and see Dr. Donne Hazel December  (b) breast MRI every May and see me in June  (c) family is aware and those who wish to be tested have been tested  (6) PALB2: breast cancer prophylaxis:   (a) anastrozole October 2019, discontinued after a few weeks secondary to hair loss and other symptoms.  (7) thalassemia: With MCV of 76.5 and ferritin 685 on April 2019   PLAN: Emily Perez is now just about 3 years out from definitive surgery for her breast cancer with no evidence of disease recurrence.  This is very favorable. Patient had an MRI breast on 07/22/2021 and this showed suspicious 10 mm enhancement in the right breast lumpectomy site which on pathology shows to be an area of fat necrosis She alternates between mammograms and MRIs.  She will have her next mammogram in November 2023 No concerning review of systems or physical examination findings today. Diagnostic mammogram ordered for November. She can return to clinic in December which is her usual schedule.  After that she can return to clinic once a year.  Total time spent: 30 minutes  *Total Encounter Time as defined by the Centers for Medicare and Medicaid Services includes, in addition to the face-to-face time of a patient visit (documented in the note above) non-face-to-face time: obtaining and reviewing outside history, ordering and reviewing medications, tests or procedures, care coordination (communications with other health care professionals or caregivers) and documentation in the medical record.

## 2021-08-18 ENCOUNTER — Other Ambulatory Visit: Payer: Self-pay | Admitting: Obstetrics and Gynecology

## 2021-08-18 ENCOUNTER — Other Ambulatory Visit (HOSPITAL_COMMUNITY)
Admission: RE | Admit: 2021-08-18 | Discharge: 2021-08-18 | Disposition: A | Discharge status: Home / Self Care | Payer: Medicare HMO | Source: Ambulatory Visit | Attending: Obstetrics and Gynecology | Admitting: Obstetrics and Gynecology

## 2021-08-18 DIAGNOSIS — Z9189 Other specified personal risk factors, not elsewhere classified: Secondary | ICD-10-CM | POA: Diagnosis not present

## 2021-08-18 DIAGNOSIS — R69 Illness, unspecified: Secondary | ICD-10-CM | POA: Diagnosis not present

## 2021-08-18 DIAGNOSIS — Z124 Encounter for screening for malignant neoplasm of cervix: Secondary | ICD-10-CM | POA: Diagnosis not present

## 2021-08-18 DIAGNOSIS — Z1151 Encounter for screening for human papillomavirus (HPV): Secondary | ICD-10-CM | POA: Diagnosis not present

## 2021-08-18 DIAGNOSIS — Z01419 Encounter for gynecological examination (general) (routine) without abnormal findings: Secondary | ICD-10-CM | POA: Insufficient documentation

## 2021-08-18 DIAGNOSIS — Z853 Personal history of malignant neoplasm of breast: Secondary | ICD-10-CM | POA: Diagnosis not present

## 2021-08-20 LAB — CYTOLOGY - PAP
Comment: NEGATIVE
Diagnosis: NEGATIVE
High risk HPV: NEGATIVE

## 2021-08-28 DIAGNOSIS — Z171 Estrogen receptor negative status [ER-]: Secondary | ICD-10-CM | POA: Diagnosis not present

## 2021-08-28 DIAGNOSIS — C50511 Malignant neoplasm of lower-outer quadrant of right female breast: Secondary | ICD-10-CM | POA: Diagnosis not present

## 2021-09-09 DIAGNOSIS — N950 Postmenopausal bleeding: Secondary | ICD-10-CM | POA: Diagnosis not present

## 2021-09-29 DIAGNOSIS — N950 Postmenopausal bleeding: Secondary | ICD-10-CM | POA: Diagnosis not present

## 2021-10-08 ENCOUNTER — Ambulatory Visit: Payer: Medicare HMO | Admitting: Internal Medicine

## 2021-11-06 DIAGNOSIS — H2513 Age-related nuclear cataract, bilateral: Secondary | ICD-10-CM | POA: Diagnosis not present

## 2021-11-06 DIAGNOSIS — H31012 Macula scars of posterior pole (postinflammatory) (post-traumatic), left eye: Secondary | ICD-10-CM | POA: Diagnosis not present

## 2021-11-06 DIAGNOSIS — H02881 Meibomian gland dysfunction right upper eyelid: Secondary | ICD-10-CM | POA: Diagnosis not present

## 2021-12-11 ENCOUNTER — Other Ambulatory Visit: Payer: Self-pay | Admitting: Hematology and Oncology

## 2021-12-11 DIAGNOSIS — Z853 Personal history of malignant neoplasm of breast: Secondary | ICD-10-CM

## 2021-12-24 DIAGNOSIS — I10 Essential (primary) hypertension: Secondary | ICD-10-CM | POA: Diagnosis not present

## 2021-12-24 DIAGNOSIS — Z0000 Encounter for general adult medical examination without abnormal findings: Secondary | ICD-10-CM | POA: Diagnosis not present

## 2021-12-24 DIAGNOSIS — Z23 Encounter for immunization: Secondary | ICD-10-CM | POA: Diagnosis not present

## 2021-12-24 DIAGNOSIS — R7303 Prediabetes: Secondary | ICD-10-CM | POA: Diagnosis not present

## 2021-12-24 DIAGNOSIS — R42 Dizziness and giddiness: Secondary | ICD-10-CM | POA: Diagnosis not present

## 2021-12-24 DIAGNOSIS — Z853 Personal history of malignant neoplasm of breast: Secondary | ICD-10-CM | POA: Diagnosis not present

## 2021-12-24 DIAGNOSIS — D569 Thalassemia, unspecified: Secondary | ICD-10-CM | POA: Diagnosis not present

## 2021-12-24 DIAGNOSIS — N1831 Chronic kidney disease, stage 3a: Secondary | ICD-10-CM | POA: Diagnosis not present

## 2021-12-24 DIAGNOSIS — R69 Illness, unspecified: Secondary | ICD-10-CM | POA: Diagnosis not present

## 2022-01-11 ENCOUNTER — Encounter: Payer: Self-pay | Admitting: Internal Medicine

## 2022-01-11 ENCOUNTER — Ambulatory Visit (INDEPENDENT_AMBULATORY_CARE_PROVIDER_SITE_OTHER): Payer: Medicare HMO | Admitting: Internal Medicine

## 2022-01-11 VITALS — BP 108/64 | HR 80 | Temp 97.6°F | Ht 63.0 in

## 2022-01-11 DIAGNOSIS — C773 Secondary and unspecified malignant neoplasm of axilla and upper limb lymph nodes: Secondary | ICD-10-CM | POA: Diagnosis not present

## 2022-01-11 DIAGNOSIS — I700 Atherosclerosis of aorta: Secondary | ICD-10-CM | POA: Diagnosis not present

## 2022-01-11 DIAGNOSIS — D701 Agranulocytosis secondary to cancer chemotherapy: Secondary | ICD-10-CM

## 2022-01-11 DIAGNOSIS — J701 Chronic and other pulmonary manifestations due to radiation: Secondary | ICD-10-CM | POA: Diagnosis not present

## 2022-01-11 DIAGNOSIS — Z836 Family history of other diseases of the respiratory system: Secondary | ICD-10-CM | POA: Diagnosis not present

## 2022-01-11 DIAGNOSIS — Z7185 Encounter for immunization safety counseling: Secondary | ICD-10-CM | POA: Diagnosis not present

## 2022-01-11 DIAGNOSIS — G620 Drug-induced polyneuropathy: Secondary | ICD-10-CM

## 2022-01-11 DIAGNOSIS — T451X5A Adverse effect of antineoplastic and immunosuppressive drugs, initial encounter: Secondary | ICD-10-CM

## 2022-01-11 DIAGNOSIS — R053 Chronic cough: Secondary | ICD-10-CM | POA: Diagnosis not present

## 2022-01-11 DIAGNOSIS — J849 Interstitial pulmonary disease, unspecified: Secondary | ICD-10-CM

## 2022-01-11 LAB — CBC WITH DIFFERENTIAL/PLATELET
Basophils Absolute: 0.1 10*3/uL (ref 0.0–0.1)
Basophils Relative: 1 % (ref 0.0–3.0)
Eosinophils Absolute: 0.2 10*3/uL (ref 0.0–0.7)
Eosinophils Relative: 2.6 % (ref 0.0–5.0)
HCT: 34.4 % — ABNORMAL LOW (ref 36.0–46.0)
Hemoglobin: 10.9 g/dL — ABNORMAL LOW (ref 12.0–15.0)
Lymphocytes Relative: 19.9 % (ref 12.0–46.0)
Lymphs Abs: 1.3 10*3/uL (ref 0.7–4.0)
MCHC: 31.7 g/dL (ref 30.0–36.0)
MCV: 80.1 fl (ref 78.0–100.0)
Monocytes Absolute: 0.6 10*3/uL (ref 0.1–1.0)
Monocytes Relative: 9.3 % (ref 3.0–12.0)
Neutro Abs: 4.4 10*3/uL (ref 1.4–7.7)
Neutrophils Relative %: 67.2 % (ref 43.0–77.0)
Platelets: 213 10*3/uL (ref 150.0–400.0)
RBC: 4.29 Mil/uL (ref 3.87–5.11)
RDW: 14.5 % (ref 11.5–15.5)
WBC: 6.5 10*3/uL (ref 4.0–10.5)

## 2022-01-11 NOTE — Progress Notes (Signed)
OV 04/25/2019 -ILD center visit.  Referred by Dr. Rodman Pickle  Subjective:  Patient ID: Emily Perez, female , DOB: 1955-12-21 , age 66 y.o. , MRN: [id number] , [address] [place] [address]   04/25/2019 -   Chief Complaint  Patient presents with   Consult    Referred by Dr. Loanne Drilling for possible ILD. Patient reports dry cough. She denies sob.      HPI Emily Perez 66 y.o. -    [facility] Comprehensive ILD Questionnaire  Symptoms: *Insidious onset of cough that started in March 2020 following radiation chemotherapy in 2019 for breast cancer on the right side.  Since it started the cough is the same.  Is moderate but occasionally severe.  She does cough at night particularly the cough is worse towards the end of the day but not necessarily wakes her up at night.  Cough is worse with brushing of the teeth and also increase mobility.  In the past she is coughed up an occasional blood.  The cough is not worse when she lies down does not affect her voice.  She does clear her throat sometimes.  There is a tickle in the throat.  She does not have any dyspnea except extremely mild dyspnea when she climbs stairs.  There is no wheezing.  SYMPTOM SCALE - ILD 04/25/2019   O2 use ra  Shortness of Breath 0 -> 5 scale with 5 being worst (score 6 If unable to do)  At rest 0  Simple tasks - showers, clothes change, eating, shaving 0  Household (dishes, doing bed, laundry) 0  Shopping 0  Walking level at own pace 0  Walking up Stairs 2  Total (30-36) Dyspnea Score 2  How bad is your cough? 2.5  How bad is your fatigue yes  How bad is nausea x  How bad is vomiting?  x  How bad is diarrhea? x  How bad is anxiety? x  How bad is depression x       Past Medical History : Denies any asthma, COPD, heart failure, rheumatoid arthritis, scleroderma, lupus, polymyositis.  Denies Sjogren's or hiatal hernia acid reflux.  Denies any sleep apnea denies any HIV.   Denies pulmonary hypertension denies diabetes denies thyroid disease denies stroke.  Denies seizures denies mononucleosis or hepatitis.  Denies kidney disease.  Denies pneumonia.  Denies blood clots denies heart disease denies pleurisy.  She has hypertension and orthopedic problems with a phone meniscus.   ROS: Positive for fatigue, arthralgia, pain in her fingers due to neuropathy contractures but no [place].  She has lost 36 pounds of weight because of breast cancer treatment in the last 2 years.  She is known to have chronic dermatitis.  Otherwise no nausea vomiting or dysphagia   FAMILY HISTORY of LUNG DISEASE: Strongly positive for pulmonary fibrosis.  She believes both sisters have idiopathic pulmonary fibrosis.  She has a sister currently 73 years of age who has had IPF since she was approximately 57.  This is just on nintedanib.  Analysis is in [place] who is 68 years of age and has been diagnosed with IPF for the last few months the sister is also on nintedanib.  Based on their experience of nintedanib patient is reluctant to do nintedanib.  The sisters also have fibromyalgia.   EXPOSURE HISTORY:-There were kids but dad smoked.  Otherwise no smoking exposure.  No marijuana use currently.  In the past she did smoke  marijuana 1980s occasionally.  Never use cocaine abuse intravenous drug use.   HOME and HOBBY DETAILS : [place]family [place] for the last several months.  Age of the home is 66 years.  Strongly denies all the mold and mildew questions and any organic antigen exposure questions in our history   OCCUPATIONAL HISTORY (122 questions) : Positive for possible asbestos exposure when she was at [facility] and working that there is some renovation going on for 2 years.  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  In this renovation at [facility] she did work in a dusty environment.   They were removing asbestos from an old building.   PULMONARY TOXICITY HISTORY (27 items): In August 2019 she underwent right breast radiation for 32 days.  Before that that she got chemo and radiation    Testing history as below  Results for ANNABEL, GIBEAU (MRN [id number]) as of 04/25/2019 12:06  Ref. Range 02/19/2019 09:40  FVC-Pre Latest Units: L 1.99  FVC-%Pred-Pre Latest Units: % 80  FEV1-Pre Latest Units: L 1.61  FEV1-%Pred-Pre Latest Units: % 83  Pre FEV1/FVC ratio Latest Units: % 81  FEV1FVC-%Pred-Pre Latest Units: % 103  Results for LARRISHA, BABINEAU (MRN [id number]) as of 04/25/2019 12:06  Ref. Range 02/19/2019 09:40  DLCO unc Latest Units: ml/min/mmHg 11.12  DLCO unc % pred Latest Units: % 57    Results for KYLEEN, VILLATORO (MRN [id number]) as of 04/25/2019 12:06  Ref. Range 02/20/2019 11:11  Anti Nuclear Antibody (ANA) Latest Ref Range: NEGATIVE  NEGATIVE  Cyclic Citrullin Peptide Ab Latest Units: UNITS <16  [employer] Latest Ref Range: NOT DETECT  NOT DETECTED  RA Latex Turbid. Latest Ref Range: <14 IU/mL <14    HRCT NOV 2020 -personally visualized and personally interpreted the result and agree with the formal report from the radiologist.  IMPRESSION: 1. New bandlike radiation fibrosis in the peripheral mid to upper right lung. 2. Chronic mild patchy subpleural reticulation and ground-glass attenuation throughout both lungs without bronchiectasis or honeycombing, unchanged since 05/18/2017 chest CT. Findings may represent a mild nonprogressive underlying interstitial lung disease such as nonspecific interstitial pneumonia (NSIP). Usual interstitial pneumonia (UIP) is considered less likely but not entirely excluded. Findings are indeterminate for UIP per consensus guidelines: Diagnosis of Idiopathic Pulmonary Fibrosis: An Official ATS/ERS/JRS/ALAT Clinical Practice Guideline. [place], Iss 5, [phone], Nov 20 2016. 3. One vessel  coronary atherosclerosis. 4. No thoracic adenopathy or other findings of metastatic disease in the chest.   Aortic Atherosclerosis (ICD10-I70.0).     Electronically Signed   By: Ilona Sorrel M.D.   On: 01/30/2019 15:13    ROS - per HPI   ASSESSMENT/PLAN   Mild and stable x 2 years Probably not IPF which your sisters x 2 have Probably non-IPF stable varity provoked by radiation and genetics Still there is a  chance this is IPF  Plan  - support non-biopsy approach  - supprt serial monitoring approach - cancel upcoming HRCT - do spirometry and dlco in 3-4 months - we can discuss visiting genetic counselor Roma Kayser at next visit      OV 08/28/2019  Subjective:  Patient ID: Emily Perez, female , DOB: April 17, 1955 , age 53 y.o. , MRN: [id number] , [address] [place] [address]  08/28/2019 -   Chief Complaint  Patient presents with   Follow-up    Pt states she has been doing okay since last visit and denies any complaints.   Follow-up interstitial lung disease [indeterminate for UIP pattern with family history of pulmonary fibrosis and also breast cancer radiation] -preferred supportive care as a goals of care  HPI Emily Perez 66 y.o. -returns for follow-up.  For interstitial lung disease.  Overall she is stable compared to when I saw her in February 2021.  She is not reporting any worsening symptoms although in the symptom score maybe her dyspnea is worse but she denies this subjectively.  She says the main issue is her cough.  She has no cough in the summer but in the winter and cold air cough gets worse.  She says despite all this she has a constant presence of a gag particularly when she is brushing the teeth and she has to clear her throat and cough quite violently.  There is no wheezing or dyspnea during this episode.  This been going on for over a year it is new onset.  She thinks it might be related to mint toothpaste but then she has been  using the mint toothpaste for many years.  She is frustrated by this.  Overall she prefers a nonpharmaceutical approach.  She is not wanting to undergo lung biopsy.  We discussed antifibrotic's and she is reluctant to take them upfront even if it means that her underlying lung disease might be UIP.  She prefers a wait and watch approach even if it means loss of lung function.  She says she might consider antifibrotic's if there is progression of interstitial lung disease.  At this point in time cough control and gag control is the main concern she has.  She is also willing to see Ms. Roma Kayser genetics counselor for family history of pulmonary fibrosis.   Results for DAVIN, MURAMOTO (MRN [id number]) as of 08/28/2019 12:01  Ref. Range 02/19/2019 09:40 08/24/2019 13:39  FVC-Pre Latest Units: L 1.99 2.01  FVC-%Pred-Pre Latest Units: % 80 81    Results for SHARYAH, BOSTWICK (MRN [id number]) as of 08/28/2019 12:01  Ref. Range 02/19/2019 09:40 08/24/2019 13:39  DLCO unc Latest Units: ml/min/mmHg 11.12 15.50  DLCO unc % pred Latest Units: % 57 79    OV 03/04/2020   Subjective:  Patient ID: Emily Perez, female , DOB: 12-07-1955, age 88 y.o. years. , MRN: [id number],  ADDRESS: [place] [address] PCP  Shirline Frees, MD Providers : Treatment Team:  Attending Provider: Brand Males, MD Patient Care Team: Shirline Frees, MD as PCP - General (Family Medicine) Magrinat, Virgie Dad, MD as Consulting Physician (Oncology) Rolm Bookbinder, MD as Consulting Physician (General Surgery) Gery Pray, MD as Consulting Physician (Radiation Oncology) Allyn Kenner, MD (Dermatology) Thurnell Lose, MD as Consulting Physician (Obstetrics and Gynecology) Marchia Bond, MD as Consulting Physician (Orthopedic Surgery) Christene Slates, MD as Physician Assistant (Radiology) Margaretha Seeds, MD as Consulting Physician (Pulmonary Disease) Brand Males, MD as Consulting Physician  (Pulmonary Disease)    Chief Complaint  Patient presents with   Follow-up    ILD, doing ok     Follow-up interstitial lung disease [indeterminate for UIP pattern with family history of pulmonary fibrosis and also breast cancer radiation] -preferred supportive care as a goals of care    HPI Emily Perez 66 y.o. -returns for follow-up.  Last seen in the summer 2021.  Overall she is stable.  She feels she does not have interstitial lung disease.  Most recent spirometry and DLCO shows mild restriction with slight reduction in diffusion suggesting of an interstitial abnormality but it is stable over time.  She had a high-resolution CT chest this was read by Dr. Weber Cooks.  I personally visualized and I do agree with this point that most of the changes are in the right behind the right breast in the right upper lobe consistent with radiation fibrosis.  Overall the severity is mild.  However I do think I also agree with Dr. Salvatore [name] from last year whether some mild interstitial abnormalities on the left side as well.  I visualized these images and showed these images to the patient.  She prefers to have an expectant approach.  I support this because she is feeling well.  In addition most likely etiology is radiation.  She is hesitant to do biopsy or antifibrotic's.  Therefore we can have a discussion about antifibrotic's if she progresses.  The overall prognosis for the next year appears to be good but she is aware that anything like smoker viruses can flareup her ILD.  We did discuss the fact that ILD even non-- IPF varieties can take a progressive course unexpectedly.      OV 01/11/2022  Subjective:  Patient ID: Emily Perez, female , DOB: Jun 05, 1955 , age 30 y.o. , MRN: [id number] , ADDRESS: [address] [place] [address] PCP Shirline Frees, MD Patient Care Team: Shirline Frees, MD as PCP - General (Family Medicine) Magrinat, Virgie Dad, MD (Inactive) as Consulting  Physician (Oncology) Rolm Bookbinder, MD as Consulting Physician (General Surgery) Gery Pray, MD as Consulting Physician (Radiation Oncology) Allyn Kenner, MD (Dermatology) Thurnell Lose, MD as Consulting Physician (Obstetrics and Gynecology) Marchia Bond, MD as Consulting Physician (Orthopedic Surgery) Christene Slates, MD as Physician Assistant (Radiology) Margaretha Seeds, MD as Consulting Physician (Pulmonary Disease) Brand Males, MD as Consulting Physician (Pulmonary Disease) Alda Berthold, DO as Consulting Physician (Neurology)  This Provider for this visit: Treatment Team:  Attending Provider: Brand Males, MD    01/11/2022 -   Chief Complaint  Patient presents with   Follow-up    PT states cough from allergies    Follow-up interstitial lung disease [indeterminate for UIP pattern with family history of pulmonary fibrosis and also breast cancer radiation] -preferred supportive care as a goals of care   Suffers from seasonal cough September through March every year  -Remote skin allergy testing positive not otherwise specified  -Never taken allergy shots  HPI Emily Perez 66 y.o. -turns for follow-up.  Last seen almost 2 years ago.  Unclear why she has not followed up.  She tells me that her respiratory status overall stable.  No new health issues no ER visits no hospitalizations no surgeries no changes in medications.  In terms of her ILD last pulmonary function test and CT scan was approximately 2 years ago.  She does not want a repeat CT scan of the chest if at all possible because of radiation risk.    She believes dyspnea stable but symptom score appears dyspnea small.  She associates this with recurrence of chronic cough that is seasonal between September through March of each year through the fall in the winter season.  In the past her blood eosinophils 300 cells per cubic millimeter.  Remotely skin allergy testing positive details not known.   2 years ago blood IgE was  normal.  She says the cough gets worse by the cold air and also by deodorants.  She believes the cough will work itself out by March 2024.  It is mild.  There is no specific wheezing.  She is willing to have a CBC with differential checked today.  Vaccine counseling: She is going to have the COVID mRNA booster but she does not believe in the flu shot.  We discussed RSV vaccine and she does not want this either.    SYMPTOM SCALE - ILD 04/25/2019  June 2021 03/04/2020  01/11/2022   O2 use ra ra ra ra  Shortness of Breath 0 -> 5 scale with 5 being worst (score 6 If unable to do)     At rest 0 0 0 0  Simple tasks - showers, clothes change, eating, shaving 0 1 0 1.5  Household (dishes, doing bed, laundry) 0 1 0 1.5  Shopping 0 0 0 1.5  Walking level at own pace 0 1 0.5 1.5  Walking up Stairs '2 2 1 '$ 1.5  Total (30-36) Dyspnea Score 2 4 1.5 7.5  How bad is your cough? 2.5 0 cough insummer. 1 and more severe on cold weather.. Gag when brushing teeth Only cough when cold and bette than last year.  Bad - sept - march  How bad is your fatigue yes 2 0 1.5  How bad is nausea x 0 0 0  How bad is vomiting?  x 0 0 0  How bad is diarrhea? x 0 0 1  How bad is anxiety? x 2 2.5 1.5  How bad is depression x 0 1 0   Simple office walk 185 feet x  3 laps goal with forehead probe 03/04/2020   O2 used ra  Number laps completed 3  Comments about pace fast  Resting Pulse Ox/HR 98% and 61/min  Final Pulse Ox/HR 97% and 100/min  Desaturated </= 88% no  Desaturated <= 3% points no  Got Tachycardic >/= 90/min yes  Symptoms at end of test none  Miscellaneous comments x    PFT     Latest Ref Rng & Units 02/26/2020    1:38 PM 08/24/2019    1:39 PM 02/19/2019    9:40 AM  PFT Results  FVC-Pre L 1.94  2.01  1.99   FVC-Predicted Pre % 79  81  80   FVC-Post L  1.86  1.85   FVC-Predicted Post %  75  74   Pre FEV1/FVC % % 86  82  81   Post FEV1/FCV % %  86  87   FEV1-Pre L 1.68   1.65  1.61   FEV1-Predicted Pre % 88  85  83   FEV1-Post L  1.60  1.61   DLCO uncorrected ml/min/mmHg 14.35  15.50  11.12   DLCO UNC% % 73  79  57   DLCO corrected ml/min/mmHg 14.35     DLCO COR %Predicted % 73     DLVA Predicted % 108  110  92        has a past medical history of Allergic rhinitis, Arthritis, Breast cancer ([place]), Family history of breast cancer, Family history of prostate cancer, Headache, Hyperlipidemia, Hypertension, and PONV (postoperative nausea and vomiting).   reports that she has never smoked. She has never used smokeless tobacco.  Past Surgical History:  Procedure Laterality Date   BREAST LUMPECTOMY WITH RADIOACTIVE SEED AND SENTINEL LYMPH NODE BIOPSY Right 08/30/2017   Procedure: RIGHT BREAST RADIOACTIVE SEED  GUIDED LUMPECTOMY WITH RIGHT RADIOACTIVE SEED TARGETED AXILLARY LYMPH NODE EXCISION AND RIGHT SENTINEL LYMPH NODE BIOPSY;  Surgeon: Rolm Bookbinder, MD;  Location: [facility];  Service: General;  Laterality: Right;   COLONOSCOPY     FOOT SURGERY Bilateral    FRACTURE SURGERY Left    wrist   PORTACATH PLACEMENT Right 05/19/2017   Procedure: INSERTION PORT-A-CATH WITH ULTRASOUND;  Surgeon: Rolm Bookbinder, MD;  Location: [facility];  Service: General;  Laterality: Right;   TUBAL LIGATION     WISDOM TOOTH EXTRACTION      Allergies  Allergen Reactions   Latex Hives and Rash   Penicillin V Other (See Comments)    Immunization History  Administered Date(s) Administered   Moderna Sars-Covid-2 Vaccination 07/15/2019, 08/13/2019   Pneumococcal Polysaccharide-23 12/27/2018   Td 10/20/2005   Zoster Recombinat (Shingrix) 01/10/2018, 01/30/2019   Zoster, Live 01/10/2018    Family History  Problem Relation Age of Onset   Heart disease Mother    Cancer Father    Diabetes Father    Hypertension Father    Hyperlipidemia Father    Emphysema Father    Heart disease Brother    Hypertension Brother    Hyperlipidemia Brother    Diabetes Brother    Prostate  cancer Brother 58   Breast cancer Sister 37   Breast cancer Sister 24   Asthma Sister    Cancer Paternal Aunt        unsure what type of cancer she had   Prostate cancer Paternal Uncle    Diabetes Brother    Gout Brother    Kidney disease Brother    Lung disease Sister    Breast cancer Sister      Current Outpatient Medications:    aspirin 81 MG tablet, Take 81 mg by mouth 3 (three) times a week. , Disp: , Rfl:    doxylamine, Sleep, (UNISOM) 25 MG tablet, Take 50 mg by mouth at bedtime as needed., Disp: , Rfl:    meclizine (ANTIVERT) 12.5 MG tablet, , Disp: , Rfl:    Multiple Vitamins-Minerals (MULTIVITAMIN WITH MINERALS) tablet, Take 1 tablet by mouth daily., Disp: , Rfl:    triamterene-hydrochlorothiazide (DYAZIDE) 37.5-25 MG capsule, Take 1 capsule by mouth daily., Disp: , Rfl: 3   zaleplon (SONATA) 5 MG capsule, TK 1 C PO QD HS PRN, Disp: , Rfl: 5      Objective:   Vitals:   01/11/22 1008  BP: 108/64  Pulse: 80  Temp: 97.6 F (36.4 C)  TempSrc: Oral  SpO2: 100%  Height: '5\' 3"'$  (1.6 m)    Estimated body mass index is 27.67 kg/m as calculated from the following:   Height as of this encounter: '5\' 3"'$  (1.6 m).   Weight as of 08/13/21: 156 lb 3.2 oz (70.9 kg).  '@WEIGHTCHANGE'$ @  There were no vitals filed for this visit.   Physical Exam  General: No distress. Looks well Neuro: Alert and Oriented x 3. GCS 15. Speech normal Psych: Pleasant Resp:  Barrel Chest - no.  Wheeze - no, Crackles - some ? On righ, No overt respiratory distress CVS: Normal heart sounds. Murmurs - no Ext: Stigmata of Connective Tissue Disease - no HEENT: Normal upper airway. PEERL +. No post nasal drip        Assessment:       ICD-10-CM   1. ILD (interstitial lung disease) ([place])  J84.9 CBC with Differential/Platelet    Pulmonary function test    CBC with Differential/Platelet  2. Fibrosis of lung following radiation ([place])  J70.1     3. Family history of pulmonary fibrosis  Z83.6      4. Chronic cough  R05.3 CBC with Differential/Platelet    CBC with Differential/Platelet    5. Vaccine counseling  Z71.85          Plan:     Patient Instructions     ICD-10-CM   1. ILD (interstitial lung disease) ([facility])  J84.9     2. Fibrosis of lung following radiation ([facility])  J70.1     3. Family history of pulmonary fibrosis  Z83.6     4. Chronic cough  R05.3       Clinically is stable.  Cough appears to be seasonal and fall allergy season related.  Plan #Regarding cough - Get CBC with differential looking for blood eosinophil count -If cough is unresolved in the next few to several months then we can get detailed blood allergy work-up and feno test  #Regarding interstitial lung disease  -Get full pulmonary function test in 6 months -Holding off on high-resolution CT chest because of radiation risk based on shared decision making  #Vaccine counseling -Respect deferral on flu shot and RSV shot   FOLLOWUP - Followup  -6 months -15 min visit; but after PFT  - walk and ILD symptoms score at followup     SIGNATURE    Dr. Brand Males, M.D., F.C.C.P,  Pulmonary and Critical Care Medicine Staff Physician, [facility] Director - Interstitial Lung Disease  Program  Pulmonary [facility] [place], [place], [address]  Pager: [phone], If no answer or between  15:00h - 7:00h: call [phone] Telephone: [phone]  10:37 AM 01/11/2022

## 2022-01-11 NOTE — Patient Instructions (Addendum)
ICD-10-CM   1. ILD (interstitial lung disease) ([facility])  J84.9     2. Fibrosis of lung following radiation ([facility])  J70.1     3. Family history of pulmonary fibrosis  Z83.6     4. Chronic cough  R05.3       Clinically is stable.  Cough appears to be seasonal and fall allergy season related.  Plan #Regarding cough - Get CBC with differential looking for blood eosinophil count -If cough is unresolved in the next few to several months then we can get detailed blood allergy work-up and feno test  #Regarding interstitial lung disease  -Get full pulmonary function test in 6 months -Holding off on high-resolution CT chest because of radiation risk based on shared decision making  #Vaccine counseling -Respect deferral on flu shot and RSV shot   FOLLOWUP - Followup  -6 months -15 min visit; but after PFT  - walk and ILD symptoms score at followup

## 2022-01-21 DIAGNOSIS — M791 Myalgia, unspecified site: Secondary | ICD-10-CM | POA: Diagnosis not present

## 2022-02-17 DIAGNOSIS — M170 Bilateral primary osteoarthritis of knee: Secondary | ICD-10-CM | POA: Diagnosis not present

## 2022-02-18 DIAGNOSIS — Z1231 Encounter for screening mammogram for malignant neoplasm of breast: Secondary | ICD-10-CM | POA: Diagnosis not present

## 2022-02-19 ENCOUNTER — Other Ambulatory Visit: Payer: Self-pay | Admitting: Hematology and Oncology

## 2022-02-22 ENCOUNTER — Ambulatory Visit
Admission: RE | Admit: 2022-02-22 | Discharge: 2022-02-22 | Disposition: A | Discharge status: Home / Self Care | Payer: Medicare HMO | Source: Ambulatory Visit | Attending: Hematology and Oncology | Admitting: Hematology and Oncology

## 2022-02-22 DIAGNOSIS — Z853 Personal history of malignant neoplasm of breast: Secondary | ICD-10-CM

## 2022-02-22 DIAGNOSIS — N641 Fat necrosis of breast: Secondary | ICD-10-CM | POA: Diagnosis not present

## 2022-02-22 MED ORDER — GADOPICLENOL 0.5 MMOL/ML IV SOLN
7.0000 mL | Freq: Once | INTRAVENOUS | Status: AC | PRN
  Administered 2022-02-22: 7 mL via INTRAVENOUS

## 2022-03-02 ENCOUNTER — Other Ambulatory Visit: Payer: Self-pay | Admitting: *Deleted

## 2022-03-02 DIAGNOSIS — Z171 Estrogen receptor negative status [ER-]: Secondary | ICD-10-CM

## 2022-03-02 DIAGNOSIS — C50511 Malignant neoplasm of lower-outer quadrant of right female breast: Secondary | ICD-10-CM

## 2022-03-02 DIAGNOSIS — C50919 Malignant neoplasm of unspecified site of unspecified female breast: Secondary | ICD-10-CM

## 2022-03-03 ENCOUNTER — Inpatient Hospital Stay (HOSPITAL_BASED_OUTPATIENT_CLINIC_OR_DEPARTMENT_OTHER): Payer: Medicare HMO | Admitting: Hematology and Oncology

## 2022-03-03 ENCOUNTER — Inpatient Hospital Stay: Payer: Medicare HMO | Attending: Hematology and Oncology

## 2022-03-03 ENCOUNTER — Encounter: Payer: Self-pay | Admitting: Hematology and Oncology

## 2022-03-03 ENCOUNTER — Other Ambulatory Visit: Payer: Self-pay

## 2022-03-03 VITALS — BP 136/84 | HR 93 | Temp 98.2°F | Resp 16 | Ht 63.0 in | Wt 152.9 lb

## 2022-03-03 DIAGNOSIS — Z833 Family history of diabetes mellitus: Secondary | ICD-10-CM | POA: Insufficient documentation

## 2022-03-03 DIAGNOSIS — N6315 Unspecified lump in the right breast, overlapping quadrants: Secondary | ICD-10-CM | POA: Diagnosis not present

## 2022-03-03 DIAGNOSIS — Z841 Family history of disorders of kidney and ureter: Secondary | ICD-10-CM | POA: Diagnosis not present

## 2022-03-03 DIAGNOSIS — Z825 Family history of asthma and other chronic lower respiratory diseases: Secondary | ICD-10-CM | POA: Diagnosis not present

## 2022-03-03 DIAGNOSIS — Z8249 Family history of ischemic heart disease and other diseases of the circulatory system: Secondary | ICD-10-CM | POA: Insufficient documentation

## 2022-03-03 DIAGNOSIS — D569 Thalassemia, unspecified: Secondary | ICD-10-CM | POA: Diagnosis not present

## 2022-03-03 DIAGNOSIS — C50919 Malignant neoplasm of unspecified site of unspecified female breast: Secondary | ICD-10-CM

## 2022-03-03 DIAGNOSIS — Z171 Estrogen receptor negative status [ER-]: Secondary | ICD-10-CM | POA: Insufficient documentation

## 2022-03-03 DIAGNOSIS — Z803 Family history of malignant neoplasm of breast: Secondary | ICD-10-CM | POA: Insufficient documentation

## 2022-03-03 DIAGNOSIS — Z8349 Family history of other endocrine, nutritional and metabolic diseases: Secondary | ICD-10-CM | POA: Diagnosis not present

## 2022-03-03 DIAGNOSIS — Z836 Family history of other diseases of the respiratory system: Secondary | ICD-10-CM | POA: Insufficient documentation

## 2022-03-03 DIAGNOSIS — Z8042 Family history of malignant neoplasm of prostate: Secondary | ICD-10-CM | POA: Diagnosis not present

## 2022-03-03 DIAGNOSIS — C50811 Malignant neoplasm of overlapping sites of right female breast: Secondary | ICD-10-CM | POA: Insufficient documentation

## 2022-03-03 DIAGNOSIS — Z79899 Other long term (current) drug therapy: Secondary | ICD-10-CM | POA: Diagnosis not present

## 2022-03-03 DIAGNOSIS — Z880 Allergy status to penicillin: Secondary | ICD-10-CM | POA: Insufficient documentation

## 2022-03-03 LAB — CMP (CANCER CENTER ONLY)
ALT: 10 U/L (ref 0–44)
AST: 14 U/L — ABNORMAL LOW (ref 15–41)
Albumin: 4.1 g/dL (ref 3.5–5.0)
Alkaline Phosphatase: 62 U/L (ref 38–126)
Anion gap: 5 (ref 5–15)
BUN: 27 mg/dL — ABNORMAL HIGH (ref 8–23)
CO2: 30 mmol/L (ref 22–32)
Calcium: 9.9 mg/dL (ref 8.9–10.3)
Chloride: 103 mmol/L (ref 98–111)
Creatinine: 1.05 mg/dL — ABNORMAL HIGH (ref 0.44–1.00)
GFR, Estimated: 58 mL/min — ABNORMAL LOW (ref >60)
Glucose, Bld: 88 mg/dL (ref 70–99)
Potassium: 4.1 mmol/L (ref 3.5–5.1)
Sodium: 138 mmol/L (ref 135–145)
Total Bilirubin: 0.5 mg/dL (ref 0.3–1.2)
Total Protein: 7.2 g/dL (ref 6.5–8.1)

## 2022-03-03 LAB — CBC WITH DIFFERENTIAL (CANCER CENTER ONLY)
Abs Immature Granulocytes: 0.02 10*3/uL (ref 0.00–0.07)
Basophils Absolute: 0.1 10*3/uL (ref 0.0–0.1)
Basophils Relative: 1 %
Eosinophils Absolute: 0.3 10*3/uL (ref 0.0–0.5)
Eosinophils Relative: 3 %
HCT: 34 % — ABNORMAL LOW (ref 36.0–46.0)
Hemoglobin: 11.1 g/dL — ABNORMAL LOW (ref 12.0–15.0)
Immature Granulocytes: 0 %
Lymphocytes Relative: 18 %
Lymphs Abs: 1.6 10*3/uL (ref 0.7–4.0)
MCH: 25.2 pg — ABNORMAL LOW (ref 26.0–34.0)
MCHC: 32.6 g/dL (ref 30.0–36.0)
MCV: 77.1 fL — ABNORMAL LOW (ref 80.0–100.0)
Monocytes Absolute: 0.9 10*3/uL (ref 0.1–1.0)
Monocytes Relative: 10 %
Neutro Abs: 6.2 10*3/uL (ref 1.7–7.7)
Neutrophils Relative %: 68 %
Platelet Count: 241 10*3/uL (ref 150–400)
RBC: 4.41 MIL/uL (ref 3.87–5.11)
RDW: 14.6 % (ref 11.5–15.5)
WBC Count: 9 10*3/uL (ref 4.0–10.5)
nRBC: 0 % (ref 0.0–0.2)

## 2022-03-03 NOTE — Progress Notes (Signed)
[place]  [phone]) [phone] [phone]     ID: Emily Perez DOB: 05-15-1955  MR#: [id number]  CWC#:[id number]  Patient Care Team: Shirline Frees, MD as PCP - General (Family Medicine) Magrinat, Virgie Dad, MD (Inactive) as Consulting Physician (Oncology) Rolm Bookbinder, MD as Consulting Physician (General Surgery) Gery Pray, MD as Consulting Physician (Radiation Oncology) Allyn Kenner, MD (Dermatology) Thurnell Lose, MD as Consulting Physician (Obstetrics and Gynecology) Marchia Bond, MD as Consulting Physician (Orthopedic Surgery) Christene Slates, MD (Inactive) as Physician Assistant (Radiology) Margaretha Seeds, MD as Consulting Physician (Pulmonary Disease) Brand Males, MD as Consulting Physician (Pulmonary Disease) Alda Berthold, DO as Consulting Physician (Neurology) OTHER MD:  CHIEF COMPLAINT: Triple negative breast cancer; PALB2 mutation  CURRENT TREATMENT: intensified screening  INTERVAL HISTORY:  Emily Perez returns today for follow up of her triple negative breast cancer.  She is under intensified screening for her PALB2 mutation. Since last visit, she had MRI breast which didn't show any evidence of malignancy. She denies any changes in breathing, bowel habits or urinary habits.  She was moving recently hence had to go through lot of stress. No unintentional weight loss although she lost about 10 lbs in the past yr, this she attributes to poor appetite, could be related to stress.  Rest of the pertinent 10 point ROS reviewed and negative   COVID 19 VACCINATION STATUS: Status post Modernax2, with booster November 2021   HISTORY OF CURRENT ILLNESS: From the original intake note:  Emily Perez had routine bilateral screening mammography at [facility] on 04/19/2017 showing a possible abnormality in the right breast. She underwent unilateral right diagnostic mammography with tomography and right breast ultrasonography at  [facility] on 04/25/2017 showing: breast density category B.  In the right breast at the 6:00 radiant 2 cm from the nipple there was a 2 cm oval mass which by ultrasound measured 2.5 cm and was irregular and hypoechoic.  There were also at least 3 abnormal appearing lymph nodes in the right axilla.  Accordingly on 05/02/2017 she proceeded to biopsy of the right breast mass in question and lymph node sampling. The pathology from this procedure showed ([id number]): Invasive ductal carcinoma grade II. One lymph node positive for metastatic carcinoma (1/1). Prognostic indicators significant for: estrogen receptor, 0% negative and progesterone receptor, 0% negative. Proliferation marker Ki67 at 80%. HER2 not amplified with ratios HER2/CEP17 signals 1.29 and average copies per cell 2.25  The patient's subsequent history is as detailed below.   PAST MEDICAL HISTORY: Past Medical History:  Diagnosis Date   Allergic rhinitis    Arthritis    Breast cancer ([place])    Family history of breast cancer    Family history of prostate cancer    Headache    migraines in the past   Hyperlipidemia    Hypertension    PONV (postoperative nausea and vomiting)    after 1st colonoscopy    PAST SURGICAL HISTORY: Past Surgical History:  Procedure Laterality Date   BREAST LUMPECTOMY WITH RADIOACTIVE SEED AND SENTINEL LYMPH NODE BIOPSY Right 08/30/2017   Procedure: RIGHT BREAST RADIOACTIVE SEED GUIDED LUMPECTOMY WITH RIGHT RADIOACTIVE SEED TARGETED AXILLARY LYMPH NODE EXCISION AND RIGHT SENTINEL LYMPH NODE BIOPSY;  Surgeon: Rolm Bookbinder, MD;  Location: [place];  Service: General;  Laterality: Right;   COLONOSCOPY     FOOT SURGERY Bilateral    FRACTURE SURGERY Left    wrist   PORTACATH PLACEMENT Right 05/19/2017   Procedure: INSERTION PORT-A-CATH WITH ULTRASOUND;  Surgeon:  Rolm Bookbinder, MD;  Location: [place];  Service: General;  Laterality: Right;   TUBAL LIGATION     WISDOM TOOTH EXTRACTION     Wrist Fracture.  Torn Rotator cuff and torn meniscus.    FAMILY HISTORY Family History  Problem Relation Age of Onset   Heart disease Mother    Cancer Father    Diabetes Father    Hypertension Father    Hyperlipidemia Father    Emphysema Father    Heart disease Brother    Hypertension Brother    Hyperlipidemia Brother    Diabetes Brother    Prostate cancer Brother 58   Breast cancer Sister 56   Breast cancer Sister 46   Asthma Sister    Cancer Paternal Aunt        unsure what type of cancer she had   Prostate cancer Paternal Uncle    Diabetes Brother    Gout Brother    Kidney disease Brother    Lung disease Sister    Breast cancer Sister   The patient's father died at age 22 due to heart disease and emphysema. The patient's mother died at age 81 due to heart disease. The patient has 6 brothers and 8 sisters. She notes 2 sisters with breast cancer. The 1st sister was diagnosed at age 22, and the 2nd sister was diagnosed at age 17. She notes that both sisters are alive. She also notes a paternal first cousin with breast cancer diagnosed in her 59's. The patient notes that her father also had bone cancer, but she's unsure if he had myeloma. She denies a family history of ovarian cancer.    GYNECOLOGIC HISTORY:  No LMP recorded. Patient is postmenopausal. 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 She is divorced. She lives by herself with no pets. The patient's daughter, [name], works in Therapist, art for [employer]. The patient's son, [name],  works for [employer]. The patient has  2 grandchildren. She belongs to [facility].     ADVANCED DIRECTIVES: Not in place.  At the 05/19/2017 visit the patient was given the appropriate documents to complete and notarized at her discretion   HEALTH  MAINTENANCE: Social History   Tobacco Use   Smoking status: Never   Smokeless tobacco: Never  Vaping Use   Vaping Use: Never used  Substance Use Topics   Alcohol use: No   Drug use: No     Colonoscopy:   PAP: September 2018 normal  Bone density: none   Allergies  Allergen Reactions   Latex Hives and Rash   Penicillin V Other (See Comments)    Current Outpatient Medications  Medication Sig Dispense Refill   aspirin 81 MG tablet Take 81 mg by mouth 3 (three) times a week.      doxylamine, Sleep, (UNISOM) 25 MG tablet Take 50 mg by mouth at bedtime as needed.     meclizine (ANTIVERT) 12.5 MG tablet      Multiple Vitamins-Minerals (MULTIVITAMIN WITH MINERALS) tablet Take 1 tablet by mouth daily.     triamterene-hydrochlorothiazide (DYAZIDE) 37.5-25 MG capsule Take 1 capsule by mouth daily.  3   zaleplon (SONATA) 5 MG capsule TK 1 C PO QD HS PRN  5   No current facility-administered medications for this visit.  OBJECTIVE: African-American woman who appears well  Vitals:   03/03/22 1143  BP: 136/84  Pulse: 93  Resp: 16  Temp: 98.2 F (36.8 C)  SpO2: 98%      Body mass index is 27.09 kg/m.   Wt Readings from Last 3 Encounters:  03/03/22 152 lb 14.4 oz (69.4 kg)  08/13/21 156 lb 3.2 oz (70.9 kg)  02/19/21 162 lb 14.4 oz (73.9 kg)   ECOG FS:1 - Symptomatic but completely ambulatory   Physical Exam Constitutional:      Appearance: Normal appearance.  Chest:     Comments: Bilateral breasts examined. No palpable masses or regional adenopathy. Post lumpectomy changes noted right breast. Musculoskeletal:        General: No swelling or tenderness.     Cervical back: Normal range of motion and neck supple. No rigidity.  Lymphadenopathy:     Cervical: No cervical adenopathy.  Skin:    General: Skin is warm and dry.  Neurological:     General: No focal deficit present.     Mental Status: She is alert.  Psychiatric:        Mood and Affect: Mood normal.      LAB RESULTS:  CMP     Component Value Date/Time   NA 141 02/19/2021 1101   K 3.8 02/19/2021 1101   CL 102 02/19/2021 1101   CO2 29 02/19/2021 1101   GLUCOSE 80 02/19/2021 1101   BUN 28 (H) 02/19/2021 1101   CREATININE 1.25 (H) 02/19/2021 1101   CALCIUM 9.7 02/19/2021 1101   PROT 7.8 02/19/2021 1101   ALBUMIN 3.7 02/19/2021 1101   AST 21 02/19/2021 1101   ALT 14 02/19/2021 1101   ALKPHOS 72 02/19/2021 1101   BILITOT 0.4 02/19/2021 1101   GFRNONAA 47 (L) 02/19/2021 1101   GFRAA >60 02/22/2019 1239   GFRAA 46 (L) 08/03/2017 1114    Lab Results  Component Value Date   WBC 9.0 03/03/2022   NEUTROABS 6.2 03/03/2022   HGB 11.1 (L) 03/03/2022   HCT 34.0 (L) 03/03/2022   MCV 77.1 (L) 03/03/2022   PLT 241 03/03/2022    No results found for: "LABCA2"  No components found for: "PFXTKW409"  No results for input(s): "INR" in the last 168 hours.  No results found for: "LABCA2"  No results found for: "BDZ329"  No results found for: "CAN125"  No results found for: "CAN153"  No results found for: "CA2729"  No components found for: "HGQUANT"  No results found for: "CEA1", "CEA" / No results found for: "CEA1", "CEA"   No results found for: "AFPTUMOR"  No results found for: "CHROMOGRNA"  No results found for: "TOTALPROTELP", "ALBUMINELP", "A1GS", "A2GS", "BETS", "BETA2SER", "GAMS", "MSPIKE", "SPEI" (this displays SPEP labs)  No results found for: "KPAFRELGTCHN", "LAMBDASER", "KAPLAMBRATIO" (kappa/lambda light chains)  No results found for: "HGBA", "HGBA2QUANT", "HGBFQUANT", "HGBSQUAN" (Hemoglobinopathy evaluation)   No results found for: "LDH"  Lab Results  Component Value Date   IRON 170 (H) 07/12/2017   TIBC 303 07/12/2017   IRONPCTSAT 56 07/12/2017   (Iron and TIBC)  Lab Results  Component Value Date   FERRITIN 685 (H) 07/12/2017    Urinalysis No results found for: "COLORURINE", "APPEARANCEUR", "LABSPEC", "PHURINE", "GLUCOSEU", "HGBUR",  "BILIRUBINUR", "KETONESUR", "PROTEINUR", "UROBILINOGEN", "NITRITE", "LEUKOCYTESUR"   STUDIES: MR BREAST BILATERAL W WO CONTRAST INC CAD  Result Date: 02/22/2022 CLINICAL DATA:  66 year old female status post treatment for triple negative right breast cancer in 2019 with subsequent neoadjuvant chemotherapy and radiation. This is  a six-month follow-up after a benign right breast MRI guided biopsy demonstrating fat necrosis. EXAM: BILATERAL BREAST MRI WITH AND WITHOUT CONTRAST TECHNIQUE: Multiplanar, multisequence MR images of both breasts were obtained prior to and following the intravenous administration of 7 ml of Vueway. Three-dimensional MR images were rendered by post-processing of the original MR data on an independent workstation. The three-dimensional MR images were interpreted, and findings are reported in the following complete MRI report for this study. Three dimensional images were evaluated at the independent interpreting workstation using the DynaCAD thin client. COMPARISON:  Previous exam(s). FINDINGS: Breast composition: b. Scattered fibroglandular tissue. Background parenchymal enhancement: Mild. Right breast: Note is made of stable postsurgical changes in the central right breast. Susceptibility artifact from post biopsy marking clip is demonstrated in the central medial right breast at posterior depth. This is slightly medial to the previously noted area of enhancement, which is less apparent on today's study. Otherwise no new or suspicious enhancement in the remainder of the right breast. Left breast: No suspicious mass or abnormal enhancement. Lymph nodes: No abnormal appearing lymph nodes. Ancillary findings:  None. IMPRESSION: 1. No MRI evidence of malignancy in either breast. 2. Stable right breast posttreatment and post biopsy changes. Focal enhancement consistent with biopsy proven fat necrosis in the deep central right breast is less apparent on today's study. RECOMMENDATION: Routine  annual screening with mammography and breast MRI. The patient is currently due for annual screening mammogram. BI-RADS CATEGORY  2: Benign. Electronically Signed   By: Kristopher Oppenheim M.D.   On: 02/22/2022 13:46    ELIGIBLE FOR AVAILABLE RESEARCH PROTOCOL: no  ASSESSMENT: 66 y.o. [place] Woman status post right breast upper outer quadrant biopsy 05/02/2017 for a clinical T2 N1-2, stage IIIB invasive ductal carcinoma, grade 2-3, triple negative, with an MIB-1 80%  (a) breast MRI 05/18/2017 shows T3 N1 disease with possible involvement of the internal mammary nodes  (1) neoadjuvant chemotherapy consisting of doxorubicin and cyclophosphamide in dose dense fashion x4 starting 05/24/2017, completed 07/05/2017  (a) planned carboplatin/paclitaxel x12 omitted secondary to peripheral neuropathy concerns  (2) status post right lumpectomy 08/30/2017 showing a complete pathologic response (ypT0 ypN0)  (a) a total of 5 lymph nodes were removed  (3) adjuvant radiation : 10/12/2017-11/28/2017 Site/dose:   1. Right breast, Ax_SCV, 1.8 Gy in 25 fractions for a total dose of 45 Gy.                       2. Right breast, 1.8 Gy in 28 fractions for a total dose of 50.4 Gy                      3. Boost, 2 Gy in 5 fractions for a total dose of 10 Gy  (4) Genetics testing offered through [facility] Multi-cancer Panel on 08/03/2017 showed a pathogenic variant in PALB2 ([id number] (p.Gln60Argfs*7)  (a) there was a VUS identified in CDH1  (b) no additional deleterious mutations were noted in ALK, APC, ATM, AXIN2, BAP1, BARD1, BLM, BMPR1A, BRCA1, BRCA2, BRIP1, CASR, CDC73, CDH1, CDK4, CDKN1B, CDKN1C, CDKN2A (p14ARF), CDKN2A (p16INK4a), CEBPA, CHEK2, CTNNA1, DICER1, DIS3L2, EPCAM*, FH, FLCN, GATA2, GPC3, GREM1*, HRAS, KIT, MAX, MEN1, MET, MLH1, MSH2, MSH3, MSH6, MUTYH, NBN, NF1, NF2, PALB2, PDGFRA, PHOX2B*, PMS2, POLD1, POLE, POT1, PRKAR1A, PTCH1, PTEN, RAD50, RAD51C, RAD51D, RB1, RECQL4, RET, RUNX1, SDHAF2, SDHB, SDHC,  SDHD, SMAD4, SMARCA4, SMARCB1, SMARCE1, STK11, SUFU, TERC, TERT, TMEM127, TP53, TSC1, TSC2, VHL, WRN*, WT1. The following genes were  evaluated for sequence changes only: EGFR*, HOXB13*, MITF*, NTHL1*, SDHA Results are negative unless otherwise indicated  (c) per NCCN guidelines, there is insufficient data to associate PALB2 mutations with increased ovarian cancer risk; this is managed according to family history  (5) PALB2: intensified screening:  (a) mammography with tomography every November and see Dr. Donne Hazel December  (b) breast MRI every May and see me in June  (c) family is aware and those who wish to be tested have been tested  (6) PALB2: breast cancer prophylaxis:   (a) anastrozole October 2019, discontinued after a few weeks secondary to hair loss and other symptoms.  (7) thalassemia: With MCV of 76.5 and ferritin 685 on April 2019   PLAN:  Janann is now just about 3 years out from definitive surgery for her breast cancer with no evidence of disease recurrence.  This is very favorable. Patient had an MRI breast on 07/22/2021 and this showed suspicious 10 mm enhancement in the right breast lumpectomy site which on pathology shows to be an area of fat necrosis She alternates between mammograms and MRIs.  Most recent MRI and mammogram with no concerns of malignancy. No concerning review of systems or physical examination findings today. She wanted to go back to MRI in May and Mammogram in November. She understands that insurance may not allow this.  She can return to clinic in December 2024 or sooner as needed.  Total time spent: 30 minutes  *Total Encounter Time as defined by the Centers for Medicare and Medicaid Services includes, in addition to the face-to-face time of a patient visit (documented in the note above) non-face-to-face time: obtaining and reviewing outside history, ordering and reviewing medications, tests or procedures, care coordination (communications with other  health care professionals or caregivers) and documentation in the medical record.

## 2022-03-12 DIAGNOSIS — R5383 Other fatigue: Secondary | ICD-10-CM | POA: Diagnosis not present

## 2022-03-12 DIAGNOSIS — R52 Pain, unspecified: Secondary | ICD-10-CM | POA: Diagnosis not present

## 2022-03-12 DIAGNOSIS — J101 Influenza due to other identified influenza virus with other respiratory manifestations: Secondary | ICD-10-CM | POA: Diagnosis not present

## 2022-03-12 DIAGNOSIS — Z03818 Encounter for observation for suspected exposure to other biological agents ruled out: Secondary | ICD-10-CM | POA: Diagnosis not present

## 2022-03-12 DIAGNOSIS — R051 Acute cough: Secondary | ICD-10-CM | POA: Diagnosis not present

## 2022-04-22 DIAGNOSIS — F4322 Adjustment disorder with anxiety: Secondary | ICD-10-CM | POA: Diagnosis not present

## 2022-04-22 DIAGNOSIS — F418 Other specified anxiety disorders: Secondary | ICD-10-CM | POA: Diagnosis not present

## 2022-04-22 DIAGNOSIS — R69 Illness, unspecified: Secondary | ICD-10-CM | POA: Diagnosis not present

## 2022-04-26 DIAGNOSIS — I10 Essential (primary) hypertension: Secondary | ICD-10-CM | POA: Diagnosis not present

## 2022-04-26 DIAGNOSIS — N1831 Chronic kidney disease, stage 3a: Secondary | ICD-10-CM | POA: Diagnosis not present

## 2022-04-26 DIAGNOSIS — E785 Hyperlipidemia, unspecified: Secondary | ICD-10-CM | POA: Diagnosis not present

## 2022-04-26 DIAGNOSIS — M170 Bilateral primary osteoarthritis of knee: Secondary | ICD-10-CM | POA: Diagnosis not present

## 2022-04-28 DIAGNOSIS — R69 Illness, unspecified: Secondary | ICD-10-CM | POA: Diagnosis not present

## 2022-04-28 DIAGNOSIS — F4322 Adjustment disorder with anxiety: Secondary | ICD-10-CM | POA: Diagnosis not present

## 2022-04-28 DIAGNOSIS — F418 Other specified anxiety disorders: Secondary | ICD-10-CM | POA: Diagnosis not present

## 2022-05-19 DIAGNOSIS — F411 Generalized anxiety disorder: Secondary | ICD-10-CM | POA: Diagnosis not present

## 2022-05-19 DIAGNOSIS — R69 Illness, unspecified: Secondary | ICD-10-CM | POA: Diagnosis not present

## 2022-05-26 DIAGNOSIS — F411 Generalized anxiety disorder: Secondary | ICD-10-CM | POA: Diagnosis not present

## 2022-05-26 DIAGNOSIS — R69 Illness, unspecified: Secondary | ICD-10-CM | POA: Diagnosis not present

## 2022-05-31 DIAGNOSIS — R69 Illness, unspecified: Secondary | ICD-10-CM | POA: Diagnosis not present

## 2022-05-31 DIAGNOSIS — N189 Chronic kidney disease, unspecified: Secondary | ICD-10-CM | POA: Diagnosis not present

## 2022-05-31 DIAGNOSIS — I700 Atherosclerosis of aorta: Secondary | ICD-10-CM | POA: Diagnosis not present

## 2022-05-31 DIAGNOSIS — Z803 Family history of malignant neoplasm of breast: Secondary | ICD-10-CM | POA: Diagnosis not present

## 2022-05-31 DIAGNOSIS — E1136 Type 2 diabetes mellitus with diabetic cataract: Secondary | ICD-10-CM | POA: Diagnosis not present

## 2022-05-31 DIAGNOSIS — Z8249 Family history of ischemic heart disease and other diseases of the circulatory system: Secondary | ICD-10-CM | POA: Diagnosis not present

## 2022-05-31 DIAGNOSIS — M1990 Unspecified osteoarthritis, unspecified site: Secondary | ICD-10-CM | POA: Diagnosis not present

## 2022-05-31 DIAGNOSIS — H259 Unspecified age-related cataract: Secondary | ICD-10-CM | POA: Diagnosis not present

## 2022-05-31 DIAGNOSIS — E1122 Type 2 diabetes mellitus with diabetic chronic kidney disease: Secondary | ICD-10-CM | POA: Diagnosis not present

## 2022-05-31 DIAGNOSIS — I129 Hypertensive chronic kidney disease with stage 1 through stage 4 chronic kidney disease, or unspecified chronic kidney disease: Secondary | ICD-10-CM | POA: Diagnosis not present

## 2022-05-31 DIAGNOSIS — Z008 Encounter for other general examination: Secondary | ICD-10-CM | POA: Diagnosis not present

## 2022-05-31 DIAGNOSIS — J8410 Pulmonary fibrosis, unspecified: Secondary | ICD-10-CM | POA: Diagnosis not present

## 2022-05-31 DIAGNOSIS — G4700 Insomnia, unspecified: Secondary | ICD-10-CM | POA: Diagnosis not present

## 2022-06-02 DIAGNOSIS — F411 Generalized anxiety disorder: Secondary | ICD-10-CM | POA: Diagnosis not present

## 2022-06-02 DIAGNOSIS — R69 Illness, unspecified: Secondary | ICD-10-CM | POA: Diagnosis not present

## 2022-06-15 DIAGNOSIS — R102 Pelvic and perineal pain: Secondary | ICD-10-CM | POA: Diagnosis not present

## 2022-06-23 ENCOUNTER — Telehealth: Payer: Self-pay

## 2022-06-23 NOTE — Telephone Encounter (Signed)
Returned Pt's call regarding breast MRI. Pt asking if MRI can be done before May as this will be a busy month. Advised Pt that order is for [facility] and gave central scheduling number. Also advised that per MD last note, insurance may not cover MRI this soon. Pt verbalized understanding and states "I am not worried". Pt states she typically has MRIs with GI at [facility] but is ok with [facility]. Instructed Pt to call back if she changes her mind about location as we will need to re-order. Pt verbalized understanding.

## 2022-07-01 DIAGNOSIS — R7303 Prediabetes: Secondary | ICD-10-CM | POA: Diagnosis not present

## 2022-07-01 DIAGNOSIS — Z853 Personal history of malignant neoplasm of breast: Secondary | ICD-10-CM | POA: Diagnosis not present

## 2022-07-01 DIAGNOSIS — R69 Illness, unspecified: Secondary | ICD-10-CM | POA: Diagnosis not present

## 2022-07-01 DIAGNOSIS — I10 Essential (primary) hypertension: Secondary | ICD-10-CM | POA: Diagnosis not present

## 2022-07-01 DIAGNOSIS — N1831 Chronic kidney disease, stage 3a: Secondary | ICD-10-CM | POA: Diagnosis not present

## 2022-07-01 DIAGNOSIS — D569 Thalassemia, unspecified: Secondary | ICD-10-CM | POA: Diagnosis not present

## 2022-07-05 ENCOUNTER — Telehealth: Payer: Self-pay | Admitting: *Deleted

## 2022-07-05 ENCOUNTER — Other Ambulatory Visit: Payer: Self-pay | Admitting: *Deleted

## 2022-07-05 DIAGNOSIS — Z803 Family history of malignant neoplasm of breast: Secondary | ICD-10-CM

## 2022-07-05 DIAGNOSIS — C50919 Malignant neoplasm of unspecified site of unspecified female breast: Secondary | ICD-10-CM

## 2022-07-05 DIAGNOSIS — Z9189 Other specified personal risk factors, not elsewhere classified: Secondary | ICD-10-CM

## 2022-07-05 DIAGNOSIS — Z171 Estrogen receptor negative status [ER-]: Secondary | ICD-10-CM

## 2022-07-05 NOTE — Telephone Encounter (Signed)
Received VM from pt stating she would like for the MRI to be done at the [facility]- it is currently scheduled for [facility].  [facility] RN called [facility] - per discussion with scheduler- pt is in need of mammogram prior to MRI per radiology protocol. MRI could not be scheduled at this time.  This RN called pt to discuss and obtained VM- message left requesting return call per above issue.

## 2022-07-06 ENCOUNTER — Encounter: Payer: Self-pay | Admitting: Hematology and Oncology

## 2022-07-07 ENCOUNTER — Encounter: Payer: Self-pay | Admitting: Hematology and Oncology

## 2022-07-21 ENCOUNTER — Encounter: Payer: Self-pay | Admitting: Hematology and Oncology

## 2022-08-12 ENCOUNTER — Encounter: Payer: Medicare HMO | Admitting: Internal Medicine

## 2022-08-12 NOTE — Patient Instructions (Signed)
ICD-10-CM   1. ILD (interstitial lung disease) (HCC)  J84.9     2. Fibrosis of lung following radiation (HCC)  J70.1     3. Family history of pulmonary fibrosis  Z83.6     4. Chronic cough  R05.3       Clinically is stable.  Cough appears to be seasonal and fall allergy season related.  Plan #Regarding cough - Get CBC with differential looking for blood eosinophil count -If cough is unresolved in the next few to several months then we can get detailed blood allergy work-up and feno test  #Regarding interstitial lung disease  -Get full pulmonary function test in 6 months -Holding off on high-resolution CT chest because of radiation risk based on shared decision making  #Vaccine counseling -Respect deferral on flu shot and RSV shot   FOLLOWUP - Followup  -6 months -15 min visit; but after PFT  - walk and ILD symptoms score at followup  

## 2022-08-12 NOTE — Progress Notes (Signed)
 This encounter was created in error - please disregard.

## 2022-08-18 ENCOUNTER — Ambulatory Visit
Admission: RE | Admit: 2022-08-18 | Discharge: 2022-08-18 | Disposition: A | Discharge status: Home / Self Care | Payer: Medicare HMO | Source: Ambulatory Visit | Attending: Hematology and Oncology | Admitting: Hematology and Oncology

## 2022-08-18 DIAGNOSIS — Z9189 Other specified personal risk factors, not elsewhere classified: Secondary | ICD-10-CM

## 2022-08-18 DIAGNOSIS — Z171 Estrogen receptor negative status [ER-]: Secondary | ICD-10-CM

## 2022-08-18 DIAGNOSIS — C50919 Malignant neoplasm of unspecified site of unspecified female breast: Secondary | ICD-10-CM

## 2022-08-18 DIAGNOSIS — N6310 Unspecified lump in the right breast, unspecified quadrant: Secondary | ICD-10-CM | POA: Diagnosis not present

## 2022-08-18 DIAGNOSIS — Z1589 Genetic susceptibility to other disease: Secondary | ICD-10-CM | POA: Diagnosis not present

## 2022-08-18 DIAGNOSIS — Z803 Family history of malignant neoplasm of breast: Secondary | ICD-10-CM | POA: Diagnosis not present

## 2022-08-18 DIAGNOSIS — N61 Mastitis without abscess: Secondary | ICD-10-CM | POA: Diagnosis not present

## 2022-08-18 MED ORDER — GADOPICLENOL 0.5 MMOL/ML IV SOLN
7.0000 mL | Freq: Once | INTRAVENOUS | Status: AC | PRN
  Administered 2022-08-18: 7 mL via INTRAVENOUS

## 2022-08-19 ENCOUNTER — Other Ambulatory Visit: Payer: Self-pay | Admitting: *Deleted

## 2022-08-19 DIAGNOSIS — R9389 Abnormal findings on diagnostic imaging of other specified body structures: Secondary | ICD-10-CM

## 2022-08-19 DIAGNOSIS — Z171 Estrogen receptor negative status [ER-]: Secondary | ICD-10-CM

## 2022-08-20 ENCOUNTER — Other Ambulatory Visit: Payer: Self-pay | Admitting: Hematology and Oncology

## 2022-08-20 DIAGNOSIS — C50511 Malignant neoplasm of lower-outer quadrant of right female breast: Secondary | ICD-10-CM

## 2022-08-20 DIAGNOSIS — Z171 Estrogen receptor negative status [ER-]: Secondary | ICD-10-CM

## 2022-08-20 DIAGNOSIS — R9389 Abnormal findings on diagnostic imaging of other specified body structures: Secondary | ICD-10-CM

## 2022-08-25 ENCOUNTER — Ambulatory Visit
Admission: RE | Admit: 2022-08-25 | Discharge: 2022-08-25 | Disposition: A | Discharge status: Home / Self Care | Payer: Medicare HMO | Source: Ambulatory Visit | Attending: Hematology and Oncology | Admitting: Hematology and Oncology

## 2022-08-25 DIAGNOSIS — R9389 Abnormal findings on diagnostic imaging of other specified body structures: Secondary | ICD-10-CM

## 2022-08-25 DIAGNOSIS — C50511 Malignant neoplasm of lower-outer quadrant of right female breast: Secondary | ICD-10-CM

## 2022-08-25 DIAGNOSIS — Z171 Estrogen receptor negative status [ER-]: Secondary | ICD-10-CM

## 2022-08-25 DIAGNOSIS — N6031 Fibrosclerosis of right breast: Secondary | ICD-10-CM | POA: Diagnosis not present

## 2022-08-25 DIAGNOSIS — N641 Fat necrosis of breast: Secondary | ICD-10-CM | POA: Diagnosis not present

## 2022-08-25 MED ORDER — GADOPICLENOL 0.5 MMOL/ML IV SOLN
8.0000 mL | Freq: Once | INTRAVENOUS | Status: AC | PRN
  Administered 2022-08-25: 8 mL via INTRAVENOUS

## 2022-08-26 ENCOUNTER — Encounter: Payer: Self-pay | Admitting: Hematology and Oncology

## 2022-08-27 ENCOUNTER — Other Ambulatory Visit: Payer: Self-pay | Admitting: Hematology and Oncology

## 2022-08-27 DIAGNOSIS — N63 Unspecified lump in unspecified breast: Secondary | ICD-10-CM

## 2022-09-01 DIAGNOSIS — Z171 Estrogen receptor negative status [ER-]: Secondary | ICD-10-CM | POA: Diagnosis not present

## 2022-09-01 DIAGNOSIS — C50511 Malignant neoplasm of lower-outer quadrant of right female breast: Secondary | ICD-10-CM | POA: Diagnosis not present

## 2022-10-19 DIAGNOSIS — J029 Acute pharyngitis, unspecified: Secondary | ICD-10-CM | POA: Diagnosis not present

## 2022-10-19 DIAGNOSIS — R509 Fever, unspecified: Secondary | ICD-10-CM | POA: Diagnosis not present

## 2022-10-19 DIAGNOSIS — U071 COVID-19: Secondary | ICD-10-CM | POA: Diagnosis not present

## 2022-10-21 ENCOUNTER — Telehealth: Payer: Self-pay | Admitting: Internal Medicine

## 2022-10-21 NOTE — Telephone Encounter (Signed)
Patient called to inform the nurse that she tested positive for covid on Monday 7/29 and she has an appt. For pft and ov on 8/5.  Patient wants to know if she should keep that appt. Or should it be rescheduled.  Please call patient to discuss at [phone]                     [phone].

## 2022-10-22 NOTE — Telephone Encounter (Signed)
Patient called back. She is no longer covid positive but still coughing quite a bit and was uncomfortable with keeping PFT for 8/5. Cancelled that appointment. She still wanted to see MR though. Patient has been told that wearing a mask is mandatory since she is still coughing. Nothing further needed

## 2022-10-24 NOTE — Progress Notes (Deleted)
OV 04/25/2019 -ILD center visit.  Referred by Dr. Mechele Collin  Subjective:  Patient ID: Emily Perez, female , DOB: [birth year] , age 67 y.o. , MRN: [id number] , [place] [address]   04/25/2019 -   Chief Complaint  Patient presents with   Consult    Referred by Dr. Everardo All for possible ILD. Patient reports dry cough. She denies sob.      HPI Emily Perez 67 y.o. -    [facility] Comprehensive ILD Questionnaire  Symptoms: *Insidious onset of cough that started in March 2020 following radiation chemotherapy in 2019 for breast cancer on the right side.  Since it started the cough is the same.  Is moderate but occasionally severe.  She does cough at night particularly the cough is worse towards the end of the day but not necessarily wakes her up at night.  Cough is worse with brushing of the teeth and also increase mobility.  In the past she is coughed up an occasional blood.  The cough is not worse when she lies down does not affect her voice.  She does clear her throat sometimes.  There is a tickle in the throat.  She does not have any dyspnea except extremely mild dyspnea when she climbs stairs.  There is no wheezing.  SYMPTOM SCALE - ILD 04/25/2019   O2 use ra  Shortness of Breath 0 -> 5 scale with 5 being worst (score 6 If unable to do)  At rest 0  Simple tasks - showers, clothes change, eating, shaving 0  Household (dishes, doing bed, laundry) 0  Shopping 0  Walking level at own pace 0  Walking up Stairs 2  Total (30-36) Dyspnea Score 2  How bad is your cough? 2.5  How bad is your fatigue yes  How bad is nausea x  How bad is vomiting?  x  How bad is diarrhea? x  How bad is anxiety? x  How bad is depression x       Past Medical History : Denies any asthma, COPD, heart failure, rheumatoid arthritis, scleroderma, lupus, polymyositis.  Denies Sjogren's or hiatal hernia acid reflux.  Denies any sleep apnea denies any HIV.   Denies pulmonary hypertension denies diabetes denies thyroid disease denies stroke.  Denies seizures denies mononucleosis or hepatitis.  Denies kidney disease.  Denies pneumonia.  Denies blood clots denies heart disease denies pleurisy.  She has hypertension and orthopedic problems with a phone meniscus.   ROS: Positive for fatigue, arthralgia, pain in her fingers due to neuropathy contractures but no [place].  She has lost 36 pounds of weight because of breast cancer treatment in the last 2 years.  She is known to have chronic dermatitis.  Otherwise no nausea vomiting or dysphagia   FAMILY HISTORY of LUNG DISEASE: Strongly positive for pulmonary fibrosis.  She believes both sisters have idiopathic pulmonary fibrosis.  She has a sister currently 79 years of age who has had IPF since she was approximately 46.  This is just on nintedanib.  Analysis is in [place] who is 69 years of age and has been diagnosed with IPF for the last few months the sister is also on nintedanib.  Based on their experience of nintedanib patient is reluctant to do nintedanib.  The sisters also have fibromyalgia.   EXPOSURE HISTORY:-There were kids but dad smoked.  Otherwise no smoking exposure.  No marijuana use currently.  In the past she did smoke  marijuana 1980s occasionally.  Never use cocaine abuse intravenous drug use.   HOME and HOBBY DETAILS : Single-family [place] for the last several months.  Age of the home is 65 years.  Strongly denies all the mold and mildew questions and any organic antigen exposure questions in our history   OCCUPATIONAL HISTORY (122 questions) : Positive for possible asbestos exposure when she was at [facility] and working that there is some renovation going on for 2 years.  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  In this renovation at [facility] she did work in a dusty environment.   They were removing asbestos from an old building.   PULMONARY TOXICITY HISTORY (27 items): In August 2019 she underwent right breast radiation for 32 days.  Before that that she got chemo and radiation    Testing history as below  Results for Emily, Perez (MRN [id number]) as of 04/25/2019 12:06  Ref. Range 02/19/2019 09:40  FVC-Pre Latest Units: L 1.99  FVC-%Pred-Pre Latest Units: % 80  FEV1-Pre Latest Units: L 1.61  FEV1-%Pred-Pre Latest Units: % 83  Pre FEV1/FVC ratio Latest Units: % 81  FEV1FVC-%Pred-Pre Latest Units: % 103  Results for Emily, Perez (MRN [id number]) as of 04/25/2019 12:06  Ref. Range 02/19/2019 09:40  DLCO unc Latest Units: ml/min/mmHg 11.12  DLCO unc % pred Latest Units: % 57    Results for Emily, Perez (MRN [id number]) as of 04/25/2019 12:06  Ref. Range 02/20/2019 11:11  Anti Nuclear Antibody (ANA) Latest Ref Range: NEGATIVE  NEGATIVE  Cyclic Citrullin Peptide Ab Latest Units: UNITS <16  Ku [employer] Latest Ref Range: NOT DETECT  NOT DETECTED  RA Latex Turbid. Latest Ref Range: <14 IU/mL <14    HRCT NOV 2020 -personally visualized and personally interpreted the result and agree with the formal report from the radiologist.  IMPRESSION: 1. New bandlike radiation fibrosis in the peripheral mid to upper right lung. 2. Chronic mild patchy subpleural reticulation and ground-glass attenuation throughout both lungs without bronchiectasis or honeycombing, unchanged since 05/18/2017 chest CT. Findings may represent a mild nonprogressive underlying interstitial lung disease such as nonspecific interstitial pneumonia (NSIP). Usual interstitial pneumonia (UIP) is considered less likely but not entirely excluded. Findings are indeterminate for UIP per consensus guidelines: Diagnosis of Idiopathic Pulmonary Fibrosis: An Official ATS/ERS/JRS/ALAT Clinical Practice Guideline. Am Rosezetta Schlatter Crit Care Med Vol 198, Iss 5, [phone], Nov 20 2016. 3. One vessel  coronary atherosclerosis. 4. No thoracic adenopathy or other findings of metastatic disease in the chest.   Aortic Atherosclerosis (ICD10-I70.0).     Electronically Signed   By: Delbert Phenix M.D.   On: 01/30/2019 15:13    ROS - per HPI   ASSESSMENT/PLAN   Mild and stable x 2 years Probably not IPF which your sisters x 2 have Probably non-IPF stable varity provoked by radiation and genetics Still there is a  chance this is IPF  Plan  - support non-biopsy approach  - supprt serial monitoring approach - cancel upcoming HRCT - do spirometry and dlco in 3-4 months - we can discuss visiting genetic counselor Maylon Cos at next visit      OV 08/28/2019  Subjective:  Patient ID: Emily Perez, female , DOB: 05/17/1955 , age 65 y.o. , MRN: [id number] , [address] [place] [address]  08/28/2019 -   Chief Complaint  Patient presents with   Follow-up    Pt states she has been doing okay since last visit and denies any complaints.   Follow-up interstitial lung disease [indeterminate for UIP pattern with family history of pulmonary fibrosis and also breast cancer radiation] -preferred supportive care as a goals of care  HPI Emily Perez 67 y.o. -returns for follow-up.  For interstitial lung disease.  Overall she is stable compared to when I saw her in February 2021.  She is not reporting any worsening symptoms although in the symptom score maybe her dyspnea is worse but she denies this subjectively.  She says the main issue is her cough.  She has no cough in the summer but in the winter and cold air cough gets worse.  She says despite all this she has a constant presence of a gag particularly when she is brushing the teeth and she has to clear her throat and cough quite violently.  There is no wheezing or dyspnea during this episode.  This been going on for over a year it is new onset.  She thinks it might be related to mint toothpaste but then she has been  using the mint toothpaste for many years.  She is frustrated by this.  Overall she prefers a nonpharmaceutical approach.  She is not wanting to undergo lung biopsy.  We discussed antifibrotic's and she is reluctant to take them upfront even if it means that her underlying lung disease might be UIP.  She prefers a wait and watch approach even if it means loss of lung function.  She says she might consider antifibrotic's if there is progression of interstitial lung disease.  At this point in time cough control and gag control is the main concern she has.  She is also willing to see Ms. Maylon Cos genetics counselor for family history of pulmonary fibrosis.   Results for Emily, Perez (MRN [id number]) as of 08/28/2019 12:01  Ref. Range 02/19/2019 09:40 08/24/2019 13:39  FVC-Pre Latest Units: L 1.99 2.01  FVC-%Pred-Pre Latest Units: % 80 81    Results for Emily, Perez (MRN [id number]) as of 08/28/2019 12:01  Ref. Range 02/19/2019 09:40 08/24/2019 13:39  DLCO unc Latest Units: ml/min/mmHg 11.12 15.50  DLCO unc % pred Latest Units: % 57 79    OV 03/04/2020   Subjective:  Patient ID: Emily Perez, female , DOB: May 11, 1955, age 24 y.o. years. , MRN: [id number],  ADDRESS: [address] [place] [address] PCP  Johny Blamer, MD Providers : Treatment Team:  Attending Provider: Kalman Shan, MD Patient Care Team: Johny Blamer, MD as PCP - General (Family Medicine) Magrinat, Valentino Hue, MD as Consulting Physician (Oncology) Emelia Loron, MD as Consulting Physician (General Surgery) Antony Blackbird, MD as Consulting Physician (Radiation Oncology) Nita Sells, MD (Dermatology) Geryl Rankins, MD as Consulting Physician (Obstetrics and Gynecology) Teryl Lucy, MD as Consulting Physician (Orthopedic Surgery) Rogelia Mire, MD as Physician Assistant (Radiology) Luciano Cutter, MD as Consulting Physician (Pulmonary Disease) Kalman Shan, MD as Consulting Physician  (Pulmonary Disease)    Chief Complaint  Patient presents with   Follow-up    ILD, doing ok     Follow-up interstitial lung disease [indeterminate for UIP pattern with family history of pulmonary fibrosis and also breast cancer radiation] -preferred supportive care as a goals of care    HPI Emily Perez 67 y.o. -returns for follow-up.  Last seen in the summer 2021.  Overall she is stable.  She feels she does not have interstitial lung disease.  Most recent spirometry and DLCO shows mild restriction with slight reduction in diffusion suggesting of an interstitial abnormality but it is stable over time.  She had a high-resolution CT chest this was read by Dr. Llana Aliment.  I personally visualized and I do agree with this point that most of the changes are in the right behind the right breast in the right upper lobe consistent with radiation fibrosis.  Overall the severity is mild.  However I do think I also agree with Dr. Cleone Slim from last year whether some mild interstitial abnormalities on the left side as well.  I visualized these images and showed these images to the patient.  She prefers to have an expectant approach.  I support this because she is feeling well.  In addition most likely etiology is radiation.  She is hesitant to do biopsy or antifibrotic's.  Therefore we can have a discussion about antifibrotic's if she progresses.  The overall prognosis for the next year appears to be good but she is aware that anything like smoker viruses can flareup her ILD.  We did discuss the fact that ILD even non-- IPF varieties can take a progressive course unexpectedly.      OV 01/11/2022  Subjective:  Patient ID: Emily Perez, female , DOB: February 08, 1956 , age 66 y.o. , MRN: [id number] , ADDRESS: [address] [place] [address] PCP Johny Blamer, MD Patient Care Team: Johny Blamer, MD as PCP - General (Family Medicine) Magrinat, Valentino Hue, MD (Inactive) as Consulting  Physician (Oncology) Emelia Loron, MD as Consulting Physician (General Surgery) Antony Blackbird, MD as Consulting Physician (Radiation Oncology) Nita Sells, MD (Dermatology) Geryl Rankins, MD as Consulting Physician (Obstetrics and Gynecology) Teryl Lucy, MD as Consulting Physician (Orthopedic Surgery) Rogelia Mire, MD as Physician Assistant (Radiology) Luciano Cutter, MD as Consulting Physician (Pulmonary Disease) Kalman Shan, MD as Consulting Physician (Pulmonary Disease) Glendale Chard, DO as Consulting Physician (Neurology)  This Provider for this visit: Treatment Team:  Attending Provider: Kalman Shan, MD    01/11/2022 -   Chief Complaint  Patient presents with   Follow-up    PT states cough from allergies    Follow-up interstitial lung disease [indeterminate for UIP pattern with family history of pulmonary fibrosis and also breast cancer radiation] -preferred supportive care as a goals of care   Suffers from seasonal cough September through March every year  -Remote skin allergy testing positive not otherwise specified  -Never taken allergy shots  HPI Emily Perez 67 y.o. -turns for follow-up.  Last seen almost 2 years ago.  Unclear why she has not followed up.  She tells me that her respiratory status overall stable.  No new health issues no ER visits no hospitalizations no surgeries no changes in medications.  In terms of her ILD last pulmonary function test and CT scan was approximately 2 years ago.  She does not want a repeat CT scan of the chest if at all possible because of radiation risk.    She believes dyspnea stable but symptom score appears dyspnea small.  She associates this with recurrence of chronic cough that is seasonal between September through March of each year through the fall in the winter season.  In the past her blood eosinophils 300 cells per cubic millimeter.  Remotely skin allergy testing positive details not known.   2 years ago blood IgE was  normal.  She says the cough gets worse by the cold air and also by deodorants.  She believes the cough will work itself out by March 2024.  It is mild.  There is no specific wheezing.  She is willing to have a CBC with differential checked today.  Vaccine counseling: She is going to have the COVID mRNA booster but she does not believe in the flu shot.  We discussed RSV vaccine and she does not want this either.  OV 08/12/2022  Subjective:  Patient ID: Emily Perez, female , DOB: [birth year] , age 65 y.o. , MRN: [id number] , ADDRESS: [address] [place] [address] PCP Johny Blamer, MD Patient Care Team: Johny Blamer, MD as PCP - General (Family Medicine) Magrinat, Valentino Hue, MD (Inactive) as Consulting Physician (Oncology) Emelia Loron, MD as Consulting Physician (General Surgery) Antony Blackbird, MD as Consulting Physician (Radiation Oncology) Nita Sells, MD (Dermatology) Geryl Rankins, MD as Consulting Physician (Obstetrics and Gynecology) Teryl Lucy, MD as Consulting Physician (Orthopedic Surgery) Rogelia Mire, MD (Inactive) as Physician Assistant (Radiology) Luciano Cutter, MD as Consulting Physician (Pulmonary Disease) Kalman Shan, MD as Consulting Physician (Pulmonary Disease) Glendale Chard, DO as Consulting Physician (Neurology)  This Provider for this visit: Treatment Team:  Attending Provider: Kalman Shan, MD    08/12/2022 -  No chief complaint on file.    HPI Emily Perez 67 y.o. -       OV 10/24/2022  Subjective:  Patient ID: Emily Perez, female , DOB: 1956/01/31 , age 57 y.o. , MRN: [id number] , ADDRESS: [address] [place] [address] PCP Noberto Retort, MD Patient Care Team: Noberto Retort, MD as PCP - General (Family Medicine) Magrinat, Valentino Hue, MD (Inactive) as Consulting Physician (Oncology) Emelia Loron, MD as Consulting Physician (General  Surgery) Antony Blackbird, MD as Consulting Physician (Radiation Oncology) Nita Sells, MD (Dermatology) Geryl Rankins, MD as Consulting Physician (Obstetrics and Gynecology) Teryl Lucy, MD as Consulting Physician (Orthopedic Surgery) Rogelia Mire, MD (Inactive) as Physician Assistant (Radiology) Luciano Cutter, MD as Consulting Physician (Pulmonary Disease) Kalman Shan, MD as Consulting Physician (Pulmonary Disease) Glendale Chard, DO as Consulting Physician (Neurology)  This Provider for this visit: Treatment Team:  Attending Provider: Kalman Shan, MD    10/24/2022 -  No chief complaint on file.    HPI Emily Perez 67 y.o. -    SYMPTOM SCALE - ILD 04/25/2019  June 2021 03/04/2020  01/11/2022   O2 use ra ra ra ra  Shortness of Breath 0 -> 5 scale with 5 being worst (score 6 If unable to do)     At rest 0 0 0 0  Simple tasks - showers, clothes change, eating, shaving 0 1 0 1.5  Household (dishes, doing bed, laundry) 0 1 0 1.5  Shopping 0 0 0 1.5  Walking level at own pace 0 1 0.5 1.5  Walking up Stairs 2 2 1  1.5  Total (30-36) Dyspnea Score 2 4 1.5 7.5  How bad is your cough? 2.5 0 cough insummer. 1 and more severe on cold weather.. Gag when brushing teeth Only cough when cold and bette than last year.  Bad - sept - march  How bad is your fatigue yes 2 0 1.5  How bad is nausea x 0 0 0  How bad is vomiting?  x 0 0 0  How bad is diarrhea? x 0 0 1  How bad is anxiety?  x 2 2.5 1.5  How bad is depression x 0 1 0   Simple office walk 185 feet x  3 laps goal with forehead probe 03/04/2020   O2 used ra  Number laps completed 3  Comments about pace fast  Resting Pulse Ox/HR 98% and 61/min  Final Pulse Ox/HR 97% and 100/min  Desaturated </= 88% no  Desaturated <= 3% points no  Got Tachycardic >/= 90/min yes  Symptoms at end of test none  Miscellaneous comments x    CT Chest data from date: ****  - personally visualized and independently  interpreted : *** - my findings are: ***   PFT     Latest Ref Rng & Units 02/26/2020    1:38 PM 08/24/2019    1:39 PM 02/19/2019    9:40 AM  ILD indicators  FVC-Pre L 1.94  2.01  1.99   FVC-Predicted Pre % 79  81  80   FVC-Post L  1.86  1.85   FVC-Predicted Post %  75  74   DLCO uncorrected ml/min/mmHg 14.35  15.50  11.12   DLCO UNC %Pred % 73  79  57   DLCO Corrected ml/min/mmHg 14.35     DLCO COR %Pred % 73         LAB RESULTS last 96 hours No results found.  LAB RESULTS last 90 days No results found for this or any previous visit (from the past 2160 hour(s)).       has a past medical history of Allergic rhinitis, Arthritis, Breast cancer (HCC), Family history of breast cancer, Family history of prostate cancer, Headache, Hyperlipidemia, Hypertension, and PONV (postoperative nausea and vomiting).   reports that she has never smoked. She has never used smokeless tobacco.  Past Surgical History:  Procedure Laterality Date   BREAST LUMPECTOMY WITH RADIOACTIVE SEED AND SENTINEL LYMPH NODE BIOPSY Right 08/30/2017   Procedure: RIGHT BREAST RADIOACTIVE SEED GUIDED LUMPECTOMY WITH RIGHT RADIOACTIVE SEED TARGETED AXILLARY LYMPH NODE EXCISION AND RIGHT SENTINEL LYMPH NODE BIOPSY;  Surgeon: Emelia Loron, MD;  Location: [facility] OR;  Service: General;  Laterality: Right;   COLONOSCOPY     FOOT SURGERY Bilateral    FRACTURE SURGERY Left    wrist   PORTACATH PLACEMENT Right 05/19/2017   Procedure: INSERTION PORT-A-CATH WITH ULTRASOUND;  Surgeon: Emelia Loron, MD;  Location: [facility] OR;  Service: General;  Laterality: Right;   TUBAL LIGATION     WISDOM TOOTH EXTRACTION      Allergies  Allergen Reactions   Latex Hives and Rash   Penicillin V Other (See Comments)    Immunization History  Administered Date(s) Administered   Moderna Sars-Covid-2 Vaccination 07/15/2019, 08/13/2019   Pneumococcal Polysaccharide-23 12/27/2018   Td 10/20/2005   Zoster Recombinant(Shingrix)  01/10/2018, 01/30/2019   Zoster, Live 01/10/2018    Family History  Problem Relation Age of Onset   Heart disease Mother    Cancer Father    Diabetes Father    Hypertension Father    Hyperlipidemia Father    Emphysema Father    Heart disease Brother    Hypertension Brother    Hyperlipidemia Brother    Diabetes Brother    Prostate cancer Brother 72   Breast cancer Sister 24   Breast cancer Sister 3   Asthma Sister    Cancer Paternal Aunt        unsure what type of cancer she had   Prostate cancer Paternal Uncle    Diabetes Brother    Gout  Brother    Kidney disease Brother    Lung disease Sister    Breast cancer Sister      Current Outpatient Medications:    aspirin 81 MG tablet, Take 81 mg by mouth 3 (three) times a week. , Disp: , Rfl:    doxylamine, Sleep, (UNISOM) 25 MG tablet, Take 50 mg by mouth at bedtime as needed., Disp: , Rfl:    meclizine (ANTIVERT) 12.5 MG tablet, , Disp: , Rfl:    Multiple Vitamins-Minerals (MULTIVITAMIN WITH MINERALS) tablet, Take 1 tablet by mouth daily., Disp: , Rfl:    triamterene-hydrochlorothiazide (DYAZIDE) 37.5-25 MG capsule, Take 1 capsule by mouth daily., Disp: , Rfl: 3   zaleplon (SONATA) 5 MG capsule, TK 1 C PO QD HS PRN, Disp: , Rfl: 5      Objective:   There were no vitals filed for this visit.  Estimated body mass index is 27.09 kg/m as calculated from the following:   Height as of 03/03/22: 5\' 3"  (1.6 m).   Weight as of 03/03/22: 69.4 kg.  @WEIGHTCHANGE @  There were no vitals filed for this visit.   Physical Exam   General: No distress. *** O2 at rest: *** Cane present: *** Sitting in wheel chair: *** Frail: *** Obese: *** Neuro: Alert and Oriented x 3. GCS 15. Speech normal Psych: Pleasant Resp:  Barrel Chest - ***.  Wheeze - ***, Crackles - ***, No overt respiratory distress CVS: Normal heart sounds. Murmurs - *** Ext: Stigmata of Connective Tissue Disease - *** HEENT: Normal upper airway. PEERL +. No  post nasal drip        Assessment:     No diagnosis found.     Plan:     Patient Instructions     ICD-10-CM   1. ILD (interstitial lung disease) (HCC)  J84.9     2. Fibrosis of lung following radiation (HCC)  J70.1     3. Family history of pulmonary fibrosis  Z83.6     4. Chronic cough  R05.3       Clinically is stable.  Cough appears to be seasonal and fall allergy season related.  Plan #Regarding cough - Get CBC with differential looking for blood eosinophil count -If cough is unresolved in the next few to several months then we can get detailed blood allergy work-up and feno test  #Regarding interstitial lung disease  -Get full pulmonary function test in 6 months -Holding off on high-resolution CT chest because of radiation risk based on shared decision making  #Vaccine counseling -Respect deferral on flu shot and RSV shot   FOLLOWUP - Followup  -6 months -15 min visit; but after PFT  - walk and ILD symptoms score at followup   FOLLOWUP No follow-ups on file.    SIGNATURE    Dr. Kalman Shan, M.D., F.C.C.P,  Pulmonary and Critical Care Medicine Staff Physician, [facility] Center Director - Interstitial Lung Disease  Program  Pulmonary Fibrosis [facility] at [facility] [place], [place], [address]  Pager: [phone], If no answer or between  15:00h - 7:00h: call [phone] Telephone: [phone]  11:21 AM 10/24/2022   Moderate Complexity MDM OFFICE  2021 E/M guidelines, first released in 2021, with minor revisions added in 2023 and 2024 Must meet the requirements for 2 out of 3 dimensions to qualify.    Number and complexity of problems addressed Amount and/or complexity of data reviewed Risk of complications  and/or morbidity  One or more chronic illness with mild exacerbation, OR progression, OR  side effects of treatment  Two or more stable chronic illnesses  One undiagnosed new problem with  uncertain prognosis  One acute illness with systemic symptoms   One Acute complicated injury Must meet the requirements for 1 of 3 of the categories)  Category 1: Tests and documents, historian  Any combination of 3 of the following:  Assessment requiring an independent historian  Review of prior external note(s) from each unique source  Review of results of each unique test  Ordering of each unique test    Category 2: Interpretation of tests   Independent interpretation of a test performed by another physician/other qualified health care professional (not separately reported)  Category 3: Discuss management/tests  Discussion of management or test interpretation with external physician/other qualified health care professional/appropriate source (not separately reported) Moderate risk of morbidity from additional diagnostic testing or treatment Examples only:  Prescription drug management  Decision regarding minor surgery with identfied patient or procedure risk factors  Decision regarding elective major surgery without identified patient or procedure risk factors  Diagnosis or treatment significantly limited by social determinants of health             [facility]IGh Complexity  OFFICE   2021 E/M guidelines, first released in 2021, with minor revisions added in 2023. Must meet the requirements for 2 out of 3 dimensions to qualify.    Number and complexity of problems addressed Amount and/or complexity of data reviewed Risk of complications and/or morbidity  Severe exacerbation of chronic illness  Acute or chronic illnesses that may pose a threat to life or bodily function, e.g., multiple trauma, acute MI, pulmonary embolus, severe respiratory distress, progressive rheumatoid arthritis, psychiatric illness with potential threat to self or others, peritonitis, acute renal failure, abrupt change in neurological status Must meet the requirements for 2 of 3 of the  categories)  Category 1: Tests and documents, historian  Any combination of 3 of the following:  Assessment requiring an independent historian  Review of prior external note(s) from each unique source  Review of results of each unique test  Ordering of each unique test    Category 2: Interpretation of tests    Independent interpretation of a test performed by another physician/other qualified health care professional (not separately reported)  Category 3: Discuss management/tests  Discussion of management or test interpretation with external physician/other qualified health care professional/appropriate source (not separately reported)  HIGH risk of morbidity from additional diagnostic testing or treatment Examples only:  Drug therapy requiring intensive monitoring for toxicity  Decision for elective major surgery with identified pateint or procedure risk factors  Decision regarding hospitalization or escalation of level of care  Decision for DNR or to de-escalate care   Parenteral controlled  substances            LEGEND - Independent interpretation involves the interpretation of a test for which there is a CPT code, and an interpretation or report is customary. When a review and interpretation of a test is performed and documented by the provider, but not separately reported (billed), then this would represent an independent interpretation. This report does not need to conform to the usual standards of a complete report of the test. This does not include interpretation of tests that do not have formal reports such as a complete blood count with differential and blood cultures. Examples would include reviewing a chest radiograph and documenting in the  medical record an interpretation, but not separately reporting (billing) the interpretation of the chest radiograph.   An appropriate source includes professionals who are not health care professionals but may be  involved in the management of the patient, such as a Clinical research associate, upper officer, case manager or teacher, and does not include discussion with family or informal caregivers.    - SDOH: SDOH are the conditions in the environments where people are born, live, learn, work, play, worship, and age that affect a wide range of health, functioning, and quality-of-life outcomes and risks. (e.g., housing, food insecurity, transportation, etc.). SDOH-related Z codes ranging from Z55-Z65 are the ICD-10-CM diagnosis codes used to document SDOH data Z55 - Problems related to education and literacy Z56 - Problems related to employment and unemployment Z57 - Occupational exposure to risk factors Z58 - Problems related to physical environment Z59 - Problems related to housing and economic circumstances [phone] - Problems related to social environment [phone] - Problems related to upbringing [phone] - Other problems related to primary support group, including family circumstances Z42 - Problems related to certain psychosocial circumstances Z65 - Problems related to other psychosocial circumstances

## 2022-10-25 ENCOUNTER — Ambulatory Visit: Payer: Medicare HMO | Admitting: Internal Medicine

## 2022-11-04 DIAGNOSIS — Z853 Personal history of malignant neoplasm of breast: Secondary | ICD-10-CM | POA: Diagnosis not present

## 2022-11-04 DIAGNOSIS — Z9189 Other specified personal risk factors, not elsewhere classified: Secondary | ICD-10-CM | POA: Diagnosis not present

## 2022-11-09 DIAGNOSIS — M79671 Pain in right foot: Secondary | ICD-10-CM | POA: Diagnosis not present

## 2022-11-09 DIAGNOSIS — M109 Gout, unspecified: Secondary | ICD-10-CM | POA: Diagnosis not present

## 2022-11-11 DIAGNOSIS — H5203 Hypermetropia, bilateral: Secondary | ICD-10-CM | POA: Diagnosis not present

## 2022-11-11 DIAGNOSIS — H524 Presbyopia: Secondary | ICD-10-CM | POA: Diagnosis not present

## 2022-11-11 DIAGNOSIS — H31012 Macula scars of posterior pole (postinflammatory) (post-traumatic), left eye: Secondary | ICD-10-CM | POA: Diagnosis not present

## 2022-11-11 DIAGNOSIS — H25813 Combined forms of age-related cataract, bilateral: Secondary | ICD-10-CM | POA: Diagnosis not present

## 2022-11-19 DIAGNOSIS — M1000 Idiopathic gout, unspecified site: Secondary | ICD-10-CM | POA: Diagnosis not present

## 2022-12-06 ENCOUNTER — Ambulatory Visit (INDEPENDENT_AMBULATORY_CARE_PROVIDER_SITE_OTHER): Payer: Medicare HMO | Admitting: Internal Medicine

## 2022-12-06 ENCOUNTER — Encounter: Payer: Self-pay | Admitting: Internal Medicine

## 2022-12-06 VITALS — BP 118/76 | HR 86 | Ht 63.0 in | Wt 155.4 lb

## 2022-12-06 DIAGNOSIS — J701 Chronic and other pulmonary manifestations due to radiation: Secondary | ICD-10-CM | POA: Diagnosis not present

## 2022-12-06 NOTE — Progress Notes (Signed)
her cough.  She has no cough in the summer but in the winter and cold air cough gets worse.  She says despite all this she has a constant presence of a gag particularly when she is brushing the teeth and she has to clear her throat and cough quite violently.  There is no wheezing or dyspnea during this episode.  This been going on for over a year it is new onset.  She thinks it might be related to mint toothpaste but then she has been using the mint toothpaste for many years.  She is frustrated by this.  Overall she prefers a nonpharmaceutical approach.  She is not wanting to undergo lung biopsy.  We discussed antifibrotic's and she is reluctant to take them upfront even if it means that her underlying lung disease might be UIP.  She prefers a wait and watch approach even if it means loss of lung function.  She says she might consider antifibrotic's if there is progression of interstitial lung disease.  At this point in time cough  control and gag control is the main concern she has.  She is also willing to see Ms. Maylon Cos genetics counselor for family history of pulmonary fibrosis.     OV 03/04/2020   Subjective:  Patient ID: Emily Perez, female , DOB: July 04, 1955, age 67 y.o. years. , MRN: [id number],  ADDRESS: [address] [place] [address] PCP  Johny Blamer, MD Providers : Treatment Team:  Attending Provider: Kalman Shan, MD Patient Care Team: Johny Blamer, MD as PCP - General (Family Medicine) Magrinat, Valentino Hue, MD as Consulting Physician (Oncology) Emelia Loron, MD as Consulting Physician (General Surgery) Antony Blackbird, MD as Consulting Physician (Radiation Oncology) Nita Sells, MD (Dermatology) Geryl Rankins, MD as Consulting Physician (Obstetrics and Gynecology) Teryl Lucy, MD as Consulting Physician (Orthopedic Surgery) Rogelia Mire, MD as Physician Assistant (Radiology) Luciano Cutter, MD as Consulting Physician (Pulmonary Disease) Kalman Shan, MD as Consulting Physician (Pulmonary Disease)    Chief Complaint  Patient presents with   Follow-up    ILD, doing ok     Follow-up interstitial lung disease [indeterminate for UIP pattern with family history of pulmonary fibrosis and also breast cancer radiation] -preferred supportive care as a goals of care    HPI Emily Perez 67 y.o. -returns for follow-up.  Last seen in the summer 2021.  Overall she is stable.  She feels she does not have interstitial lung disease.  Most recent spirometry and DLCO shows mild restriction with slight reduction in diffusion suggesting of an interstitial abnormality but it is stable over time.  She had a high-resolution CT chest this was read by Dr. Llana Aliment.  I personally visualized and I do agree with this point that most of the changes are in the right behind the right breast in the right upper lobe consistent with radiation fibrosis.  Overall the severity is  mild.  However I do think I also agree with Dr. Cleone Slim from last year whether some mild interstitial abnormalities on the left side as well.  I visualized these images and showed these images to the patient.  She prefers to have an expectant approach.  I support this because she is feeling well.  In addition most likely etiology is radiation.  She is hesitant to do biopsy or antifibrotic's.  Therefore we can have a discussion about antifibrotic's if she progresses.  The overall prognosis for the next year appears to be good but she  OV 04/25/2019 -ILD center visit.  Referred by Dr. Mechele Collin  Subjective:  Patient ID: Emily Perez, female , DOB: Sep 15, 1955 , age 56 y.o. , MRN: [id number] , [address] [place] [address]   04/25/2019 -   Chief Complaint  Patient presents with   Consult    Referred by Dr. Everardo All for possible ILD. Patient reports dry cough. She denies sob.      HPI Emily Perez 67 y.o. -    [facility] Comprehensive ILD Questionnaire  Symptoms: *Insidious onset of cough that started in March 2020 following radiation chemotherapy in 2019 for breast cancer on the right side.  Since it started the cough is the same.  Is moderate but occasionally severe.  She does cough at night particularly the cough is worse towards the end of the day but not necessarily wakes her up at night.  Cough is worse with brushing of the teeth and also increase mobility.  In the past she is coughed up an occasional blood.  The cough is not worse when she lies down does not affect her voice.  She does clear her throat sometimes.  There is a tickle in the throat.  She does not have any dyspnea except extremely mild dyspnea when she climbs stairs.  There is no wheezing.     Past Medical History : Denies any asthma, COPD, heart failure, rheumatoid arthritis, scleroderma, lupus, polymyositis.  Denies Sjogren's or hiatal hernia acid reflux.  Denies any sleep apnea denies any HIV.  Denies pulmonary hypertension denies diabetes denies thyroid disease denies stroke.  Denies seizures denies mononucleosis or hepatitis.  Denies kidney disease.  Denies pneumonia.  Denies blood clots denies heart disease denies pleurisy.  She has hypertension and orthopedic problems with a phone meniscus.   ROS: Positive for fatigue, arthralgia, pain in her fingers due to neuropathy contractures but no Raynard.  She has lost 36 pounds of weight because of breast cancer treatment in the last 2 years.  She is known to  have chronic dermatitis.  Otherwise no nausea vomiting or dysphagia   FAMILY HISTORY of LUNG DISEASE: Strongly positive for pulmonary fibrosis.  She believes both sisters have idiopathic pulmonary fibrosis.  She has a sister currently 44 years of age who has had IPF since she was approximately 28.  This is just on nintedanib.  Analysis is in [place] who is 69 years of age and has been diagnosed with IPF for the last few months the sister is also on nintedanib.  Based on their experience of nintedanib patient is reluctant to do nintedanib.  The sisters also have fibromyalgia.   EXPOSURE HISTORY:-There were kids but dad smoked.  Otherwise no smoking exposure.  No marijuana use currently.  In the past she did smoke marijuana 1980s occasionally.  Never use cocaine abuse intravenous drug use.   HOME and HOBBY DETAILS : [place] several [place].  Age of the home is 65 years.  Strongly denies all the mold and mildew questions and any organic antigen exposure questions in our history   OCCUPATIONAL HISTORY (122 questions) : Positive for possible asbestos exposure when she was at [facility] and working that there is some renovation going on for 2 years.  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  In this  is aware that anything like smoker viruses can flareup her ILD.  We did discuss the fact that ILD even non-- IPF varieties can take a progressive course unexpectedly.      OV 01/11/2022  Subjective:  Patient ID: Emily Perez, female , DOB: 1955-08-17 , age 7 y.o. , MRN: [id number] , ADDRESS: [address] [place] [address] PCP Johny Blamer, MD Patient Care Team: Johny Blamer, MD as PCP - General (Family Medicine) Magrinat, Valentino Hue, MD (Inactive) as Consulting Physician (Oncology) Emelia Loron, MD as Consulting Physician (General Surgery) Antony Blackbird, MD as Consulting Physician (Radiation Oncology) Nita Sells, MD (Dermatology) Geryl Rankins, MD as Consulting Physician (Obstetrics and Gynecology) Teryl Lucy, MD as Consulting Physician (Orthopedic Surgery) Rogelia Mire, MD as Physician Assistant (Radiology) Luciano Cutter, MD as Consulting Physician (Pulmonary Disease) Kalman Shan, MD as Consulting Physician (Pulmonary Disease) Glendale Chard, DO as Consulting Physician (Neurology)  This Provider for this visit: Treatment Team:  Attending Provider: Kalman Shan, MD    01/11/2022 -   Chief Complaint  Patient presents with   Follow-up    PT states cough from allergies   HPI Emily Perez 67 y.o. -turns for follow-up.  Last seen almost 2 years ago.  Unclear why she has not followed up.  She  tells me that her respiratory status overall stable.  No new health issues no ER visits no hospitalizations no surgeries no changes in medications.  In terms of her ILD last pulmonary function test and CT scan was approximately 2 years ago.  She does not want a repeat CT scan of the chest if at all possible because of radiation risk.    She believes dyspnea stable but symptom score appears dyspnea small.  She associates this with recurrence of chronic cough that is seasonal between September through March of each year through the fall in the winter season.  In the past her blood eosinophils 300 cells per cubic millimeter.  Remotely skin allergy testing positive details not known.  2 years ago blood IgE was normal.  She says the cough gets worse by the cold air and also by deodorants.  She believes the cough will work itself out by March 2024.  It is mild.  There is no specific wheezing.  She is willing to have a CBC with differential checked today.  Vaccine counseling: She is going to have the COVID mRNA booster but she does not believe in the flu shot.  We discussed RSV vaccine and she does not want this either.         OV 12/06/2022  Subjective:  Patient ID: Emily Perez, female , DOB: 1956/02/10 , age 66 y.o. , MRN: [id number] , ADDRESS: [address] [place] [address] PCP Noberto Retort, MD Patient Care Team: Noberto Retort, MD as PCP - General (Family Medicine) Magrinat, Valentino Hue, MD (Inactive) as Consulting Physician (Oncology) Emelia Loron, MD as Consulting Physician (General Surgery) Antony Blackbird, MD as Consulting Physician (Radiation Oncology) Nita Sells, MD (Dermatology) Geryl Rankins, MD as Consulting Physician (Obstetrics and Gynecology) Teryl Lucy, MD as Consulting Physician (Orthopedic Surgery) Rogelia Mire, MD (Inactive) as Physician Assistant (Radiology) Luciano Cutter, MD as Consulting Physician (Pulmonary Disease) Kalman Shan, MD as Consulting Physician (Pulmonary Disease) Glendale Chard, DO as Consulting Physician (Neurology)  This Provider for this visit: Treatment Team:  Attending Provider: Kalman Shan, MD    12/06/2022 -   Chief Complaint  OV 04/25/2019 -ILD center visit.  Referred by Dr. Mechele Collin  Subjective:  Patient ID: Emily Perez, female , DOB: Sep 15, 1955 , age 56 y.o. , MRN: [id number] , [address] [place] [address]   04/25/2019 -   Chief Complaint  Patient presents with   Consult    Referred by Dr. Everardo All for possible ILD. Patient reports dry cough. She denies sob.      HPI Emily Perez 67 y.o. -    [facility] Comprehensive ILD Questionnaire  Symptoms: *Insidious onset of cough that started in March 2020 following radiation chemotherapy in 2019 for breast cancer on the right side.  Since it started the cough is the same.  Is moderate but occasionally severe.  She does cough at night particularly the cough is worse towards the end of the day but not necessarily wakes her up at night.  Cough is worse with brushing of the teeth and also increase mobility.  In the past she is coughed up an occasional blood.  The cough is not worse when she lies down does not affect her voice.  She does clear her throat sometimes.  There is a tickle in the throat.  She does not have any dyspnea except extremely mild dyspnea when she climbs stairs.  There is no wheezing.     Past Medical History : Denies any asthma, COPD, heart failure, rheumatoid arthritis, scleroderma, lupus, polymyositis.  Denies Sjogren's or hiatal hernia acid reflux.  Denies any sleep apnea denies any HIV.  Denies pulmonary hypertension denies diabetes denies thyroid disease denies stroke.  Denies seizures denies mononucleosis or hepatitis.  Denies kidney disease.  Denies pneumonia.  Denies blood clots denies heart disease denies pleurisy.  She has hypertension and orthopedic problems with a phone meniscus.   ROS: Positive for fatigue, arthralgia, pain in her fingers due to neuropathy contractures but no Raynard.  She has lost 36 pounds of weight because of breast cancer treatment in the last 2 years.  She is known to  have chronic dermatitis.  Otherwise no nausea vomiting or dysphagia   FAMILY HISTORY of LUNG DISEASE: Strongly positive for pulmonary fibrosis.  She believes both sisters have idiopathic pulmonary fibrosis.  She has a sister currently 44 years of age who has had IPF since she was approximately 28.  This is just on nintedanib.  Analysis is in [place] who is 69 years of age and has been diagnosed with IPF for the last few months the sister is also on nintedanib.  Based on their experience of nintedanib patient is reluctant to do nintedanib.  The sisters also have fibromyalgia.   EXPOSURE HISTORY:-There were kids but dad smoked.  Otherwise no smoking exposure.  No marijuana use currently.  In the past she did smoke marijuana 1980s occasionally.  Never use cocaine abuse intravenous drug use.   HOME and HOBBY DETAILS : Single-family home [place] the last several months.  Age of the home is 65 years.  Strongly denies all the mold and mildew questions and any organic antigen exposure questions in our history   OCCUPATIONAL HISTORY (122 questions) : Positive for possible asbestos exposure when she was at [facility] and working that there is some renovation going on for 2 years.  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  In this  her cough.  She has no cough in the summer but in the winter and cold air cough gets worse.  She says despite all this she has a constant presence of a gag particularly when she is brushing the teeth and she has to clear her throat and cough quite violently.  There is no wheezing or dyspnea during this episode.  This been going on for over a year it is new onset.  She thinks it might be related to mint toothpaste but then she has been using the mint toothpaste for many years.  She is frustrated by this.  Overall she prefers a nonpharmaceutical approach.  She is not wanting to undergo lung biopsy.  We discussed antifibrotic's and she is reluctant to take them upfront even if it means that her underlying lung disease might be UIP.  She prefers a wait and watch approach even if it means loss of lung function.  She says she might consider antifibrotic's if there is progression of interstitial lung disease.  At this point in time cough  control and gag control is the main concern she has.  She is also willing to see Ms. Maylon Cos genetics counselor for family history of pulmonary fibrosis.     OV 03/04/2020   Subjective:  Patient ID: Emily Perez, female , DOB: July 04, 1955, age 67 y.o. years. , MRN: [id number],  ADDRESS: [address] [place] [address] PCP  Johny Blamer, MD Providers : Treatment Team:  Attending Provider: Kalman Shan, MD Patient Care Team: Johny Blamer, MD as PCP - General (Family Medicine) Magrinat, Valentino Hue, MD as Consulting Physician (Oncology) Emelia Loron, MD as Consulting Physician (General Surgery) Antony Blackbird, MD as Consulting Physician (Radiation Oncology) Nita Sells, MD (Dermatology) Geryl Rankins, MD as Consulting Physician (Obstetrics and Gynecology) Teryl Lucy, MD as Consulting Physician (Orthopedic Surgery) Rogelia Mire, MD as Physician Assistant (Radiology) Luciano Cutter, MD as Consulting Physician (Pulmonary Disease) Kalman Shan, MD as Consulting Physician (Pulmonary Disease)    Chief Complaint  Patient presents with   Follow-up    ILD, doing ok     Follow-up interstitial lung disease [indeterminate for UIP pattern with family history of pulmonary fibrosis and also breast cancer radiation] -preferred supportive care as a goals of care    HPI Emily Perez 67 y.o. -returns for follow-up.  Last seen in the summer 2021.  Overall she is stable.  She feels she does not have interstitial lung disease.  Most recent spirometry and DLCO shows mild restriction with slight reduction in diffusion suggesting of an interstitial abnormality but it is stable over time.  She had a high-resolution CT chest this was read by Dr. Llana Aliment.  I personally visualized and I do agree with this point that most of the changes are in the right behind the right breast in the right upper lobe consistent with radiation fibrosis.  Overall the severity is  mild.  However I do think I also agree with Dr. Cleone Slim from last year whether some mild interstitial abnormalities on the left side as well.  I visualized these images and showed these images to the patient.  She prefers to have an expectant approach.  I support this because she is feeling well.  In addition most likely etiology is radiation.  She is hesitant to do biopsy or antifibrotic's.  Therefore we can have a discussion about antifibrotic's if she progresses.  The overall prognosis for the next year appears to be good but she  is aware that anything like smoker viruses can flareup her ILD.  We did discuss the fact that ILD even non-- IPF varieties can take a progressive course unexpectedly.      OV 01/11/2022  Subjective:  Patient ID: Emily Perez, female , DOB: 1955-08-17 , age 7 y.o. , MRN: [id number] , ADDRESS: [address] [place] [address] PCP Johny Blamer, MD Patient Care Team: Johny Blamer, MD as PCP - General (Family Medicine) Magrinat, Valentino Hue, MD (Inactive) as Consulting Physician (Oncology) Emelia Loron, MD as Consulting Physician (General Surgery) Antony Blackbird, MD as Consulting Physician (Radiation Oncology) Nita Sells, MD (Dermatology) Geryl Rankins, MD as Consulting Physician (Obstetrics and Gynecology) Teryl Lucy, MD as Consulting Physician (Orthopedic Surgery) Rogelia Mire, MD as Physician Assistant (Radiology) Luciano Cutter, MD as Consulting Physician (Pulmonary Disease) Kalman Shan, MD as Consulting Physician (Pulmonary Disease) Glendale Chard, DO as Consulting Physician (Neurology)  This Provider for this visit: Treatment Team:  Attending Provider: Kalman Shan, MD    01/11/2022 -   Chief Complaint  Patient presents with   Follow-up    PT states cough from allergies   HPI Emily Perez 67 y.o. -turns for follow-up.  Last seen almost 2 years ago.  Unclear why she has not followed up.  She  tells me that her respiratory status overall stable.  No new health issues no ER visits no hospitalizations no surgeries no changes in medications.  In terms of her ILD last pulmonary function test and CT scan was approximately 2 years ago.  She does not want a repeat CT scan of the chest if at all possible because of radiation risk.    She believes dyspnea stable but symptom score appears dyspnea small.  She associates this with recurrence of chronic cough that is seasonal between September through March of each year through the fall in the winter season.  In the past her blood eosinophils 300 cells per cubic millimeter.  Remotely skin allergy testing positive details not known.  2 years ago blood IgE was normal.  She says the cough gets worse by the cold air and also by deodorants.  She believes the cough will work itself out by March 2024.  It is mild.  There is no specific wheezing.  She is willing to have a CBC with differential checked today.  Vaccine counseling: She is going to have the COVID mRNA booster but she does not believe in the flu shot.  We discussed RSV vaccine and she does not want this either.         OV 12/06/2022  Subjective:  Patient ID: Emily Perez, female , DOB: 1956/02/10 , age 66 y.o. , MRN: [id number] , ADDRESS: [address] [place] [address] PCP Noberto Retort, MD Patient Care Team: Noberto Retort, MD as PCP - General (Family Medicine) Magrinat, Valentino Hue, MD (Inactive) as Consulting Physician (Oncology) Emelia Loron, MD as Consulting Physician (General Surgery) Antony Blackbird, MD as Consulting Physician (Radiation Oncology) Nita Sells, MD (Dermatology) Geryl Rankins, MD as Consulting Physician (Obstetrics and Gynecology) Teryl Lucy, MD as Consulting Physician (Orthopedic Surgery) Rogelia Mire, MD (Inactive) as Physician Assistant (Radiology) Luciano Cutter, MD as Consulting Physician (Pulmonary Disease) Kalman Shan, MD as Consulting Physician (Pulmonary Disease) Glendale Chard, DO as Consulting Physician (Neurology)  This Provider for this visit: Treatment Team:  Attending Provider: Kalman Shan, MD    12/06/2022 -   Chief Complaint  OV 04/25/2019 -ILD center visit.  Referred by Dr. Mechele Collin  Subjective:  Patient ID: Emily Perez, female , DOB: Sep 15, 1955 , age 56 y.o. , MRN: [id number] , [address] [place] [address]   04/25/2019 -   Chief Complaint  Patient presents with   Consult    Referred by Dr. Everardo All for possible ILD. Patient reports dry cough. She denies sob.      HPI Emily Perez 67 y.o. -    [facility] Comprehensive ILD Questionnaire  Symptoms: *Insidious onset of cough that started in March 2020 following radiation chemotherapy in 2019 for breast cancer on the right side.  Since it started the cough is the same.  Is moderate but occasionally severe.  She does cough at night particularly the cough is worse towards the end of the day but not necessarily wakes her up at night.  Cough is worse with brushing of the teeth and also increase mobility.  In the past she is coughed up an occasional blood.  The cough is not worse when she lies down does not affect her voice.  She does clear her throat sometimes.  There is a tickle in the throat.  She does not have any dyspnea except extremely mild dyspnea when she climbs stairs.  There is no wheezing.     Past Medical History : Denies any asthma, COPD, heart failure, rheumatoid arthritis, scleroderma, lupus, polymyositis.  Denies Sjogren's or hiatal hernia acid reflux.  Denies any sleep apnea denies any HIV.  Denies pulmonary hypertension denies diabetes denies thyroid disease denies stroke.  Denies seizures denies mononucleosis or hepatitis.  Denies kidney disease.  Denies pneumonia.  Denies blood clots denies heart disease denies pleurisy.  She has hypertension and orthopedic problems with a phone meniscus.   ROS: Positive for fatigue, arthralgia, pain in her fingers due to neuropathy contractures but no Raynard.  She has lost 36 pounds of weight because of breast cancer treatment in the last 2 years.  She is known to  have chronic dermatitis.  Otherwise no nausea vomiting or dysphagia   FAMILY HISTORY of LUNG DISEASE: Strongly positive for pulmonary fibrosis.  She believes both sisters have idiopathic pulmonary fibrosis.  She has a sister currently 44 years of age who has had IPF since she was approximately 28.  This is just on nintedanib.  Analysis is in [place] who is 69 years of age and has been diagnosed with IPF for the last few months the sister is also on nintedanib.  Based on their experience of nintedanib patient is reluctant to do nintedanib.  The sisters also have fibromyalgia.   EXPOSURE HISTORY:-There were kids but dad smoked.  Otherwise no smoking exposure.  No marijuana use currently.  In the past she did smoke marijuana 1980s occasionally.  Never use cocaine abuse intravenous drug use.   HOME and HOBBY DETAILS : [place]-family home [place] several months.  Age of the home is 65 years.  Strongly denies all the mold and mildew questions and any organic antigen exposure questions in our history   OCCUPATIONAL HISTORY (122 questions) : Positive for possible asbestos exposure when she was at [facility] and working that there is some renovation going on for 2 years.  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  In this  is aware that anything like smoker viruses can flareup her ILD.  We did discuss the fact that ILD even non-- IPF varieties can take a progressive course unexpectedly.      OV 01/11/2022  Subjective:  Patient ID: Emily Perez, female , DOB: 1955-08-17 , age 7 y.o. , MRN: [id number] , ADDRESS: [address] [place] [address] PCP Johny Blamer, MD Patient Care Team: Johny Blamer, MD as PCP - General (Family Medicine) Magrinat, Valentino Hue, MD (Inactive) as Consulting Physician (Oncology) Emelia Loron, MD as Consulting Physician (General Surgery) Antony Blackbird, MD as Consulting Physician (Radiation Oncology) Nita Sells, MD (Dermatology) Geryl Rankins, MD as Consulting Physician (Obstetrics and Gynecology) Teryl Lucy, MD as Consulting Physician (Orthopedic Surgery) Rogelia Mire, MD as Physician Assistant (Radiology) Luciano Cutter, MD as Consulting Physician (Pulmonary Disease) Kalman Shan, MD as Consulting Physician (Pulmonary Disease) Glendale Chard, DO as Consulting Physician (Neurology)  This Provider for this visit: Treatment Team:  Attending Provider: Kalman Shan, MD    01/11/2022 -   Chief Complaint  Patient presents with   Follow-up    PT states cough from allergies   HPI Emily Perez 67 y.o. -turns for follow-up.  Last seen almost 2 years ago.  Unclear why she has not followed up.  She  tells me that her respiratory status overall stable.  No new health issues no ER visits no hospitalizations no surgeries no changes in medications.  In terms of her ILD last pulmonary function test and CT scan was approximately 2 years ago.  She does not want a repeat CT scan of the chest if at all possible because of radiation risk.    She believes dyspnea stable but symptom score appears dyspnea small.  She associates this with recurrence of chronic cough that is seasonal between September through March of each year through the fall in the winter season.  In the past her blood eosinophils 300 cells per cubic millimeter.  Remotely skin allergy testing positive details not known.  2 years ago blood IgE was normal.  She says the cough gets worse by the cold air and also by deodorants.  She believes the cough will work itself out by March 2024.  It is mild.  There is no specific wheezing.  She is willing to have a CBC with differential checked today.  Vaccine counseling: She is going to have the COVID mRNA booster but she does not believe in the flu shot.  We discussed RSV vaccine and she does not want this either.         OV 12/06/2022  Subjective:  Patient ID: Emily Perez, female , DOB: 1956/02/10 , age 66 y.o. , MRN: [id number] , ADDRESS: [address] [place] [address] PCP Noberto Retort, MD Patient Care Team: Noberto Retort, MD as PCP - General (Family Medicine) Magrinat, Valentino Hue, MD (Inactive) as Consulting Physician (Oncology) Emelia Loron, MD as Consulting Physician (General Surgery) Antony Blackbird, MD as Consulting Physician (Radiation Oncology) Nita Sells, MD (Dermatology) Geryl Rankins, MD as Consulting Physician (Obstetrics and Gynecology) Teryl Lucy, MD as Consulting Physician (Orthopedic Surgery) Rogelia Mire, MD (Inactive) as Physician Assistant (Radiology) Luciano Cutter, MD as Consulting Physician (Pulmonary Disease) Kalman Shan, MD as Consulting Physician (Pulmonary Disease) Glendale Chard, DO as Consulting Physician (Neurology)  This Provider for this visit: Treatment Team:  Attending Provider: Kalman Shan, MD    12/06/2022 -   Chief Complaint

## 2022-12-06 NOTE — Patient Instructions (Addendum)
  Fibrosis of lung following radiation (HCC)   -In 2020 radiologist thought there was interstitial lung disease [more generalized pulmonary fibrosis] as well as right upper lobe fibrosis but by late 2021 the radiologist determined that there was only right upper lobe radiation related fibrosis and there was no generalized fibrosis.  -Because of the varying reports i and no follow-up CT scan of the chest since 2021 there are 2 terminology's have remained..  At this point in time the latest evidence is that as of late 2021 [your most recent CT scan] all you have is mild fibrosis limited to the right upper lobe secondary to breast cancer radiation.  -Glad feeling well right now and no active symptoms.   Plan -CMA to remove interstitial lung disease terminology from the medical record (will mark as resolved) - Keep diagnosis as mild fibrosis of the right upper lobe related to radiation -Get high-resolution CT scan of the chest chest in 6 months to see current status    - Followup  -6 months -15 min visit; but after his recent CT chest.

## 2023-01-05 DIAGNOSIS — M810 Age-related osteoporosis without current pathological fracture: Secondary | ICD-10-CM | POA: Diagnosis not present

## 2023-01-05 DIAGNOSIS — Z853 Personal history of malignant neoplasm of breast: Secondary | ICD-10-CM | POA: Diagnosis not present

## 2023-01-05 DIAGNOSIS — D569 Thalassemia, unspecified: Secondary | ICD-10-CM | POA: Diagnosis not present

## 2023-01-05 DIAGNOSIS — N1831 Chronic kidney disease, stage 3a: Secondary | ICD-10-CM | POA: Diagnosis not present

## 2023-01-05 DIAGNOSIS — J701 Chronic and other pulmonary manifestations due to radiation: Secondary | ICD-10-CM | POA: Diagnosis not present

## 2023-01-05 DIAGNOSIS — F5101 Primary insomnia: Secondary | ICD-10-CM | POA: Diagnosis not present

## 2023-01-05 DIAGNOSIS — I700 Atherosclerosis of aorta: Secondary | ICD-10-CM | POA: Diagnosis not present

## 2023-01-05 DIAGNOSIS — I10 Essential (primary) hypertension: Secondary | ICD-10-CM | POA: Diagnosis not present

## 2023-01-05 DIAGNOSIS — M10072 Idiopathic gout, left ankle and foot: Secondary | ICD-10-CM | POA: Diagnosis not present

## 2023-01-05 DIAGNOSIS — R7303 Prediabetes: Secondary | ICD-10-CM | POA: Diagnosis not present

## 2023-01-05 DIAGNOSIS — Z0000 Encounter for general adult medical examination without abnormal findings: Secondary | ICD-10-CM | POA: Diagnosis not present

## 2023-01-05 DIAGNOSIS — L600 Ingrowing nail: Secondary | ICD-10-CM | POA: Diagnosis not present

## 2023-01-13 ENCOUNTER — Encounter: Payer: Self-pay | Admitting: Podiatry

## 2023-01-13 ENCOUNTER — Telehealth: Payer: Self-pay | Admitting: Podiatry

## 2023-01-13 ENCOUNTER — Ambulatory Visit: Payer: Medicare HMO | Admitting: Podiatry

## 2023-01-13 DIAGNOSIS — M2041 Other hammer toe(s) (acquired), right foot: Secondary | ICD-10-CM

## 2023-01-13 DIAGNOSIS — L600 Ingrowing nail: Secondary | ICD-10-CM | POA: Diagnosis not present

## 2023-01-13 DIAGNOSIS — M2042 Other hammer toe(s) (acquired), left foot: Secondary | ICD-10-CM

## 2023-01-13 NOTE — Telephone Encounter (Signed)
Pt called and was not given the instructions for how to care for foot after procedure today.  I checked the AVS and it was in there and I have emailed it to the pt. Her email is [email or web address]

## 2023-01-14 NOTE — Progress Notes (Signed)
Subjective:   Patient ID: Emily Perez, female   DOB: 67 y.o.   MRN: [id number]   HPI Patient presents stating that he has had a painful toenail on his right big toe and has had a history of gout and possible other pathology.  Has a growth on his second toe left foot.  States the nail sore hard to walk with and patient does not smoke tries to be active   Review of Systems  All other systems reviewed and are negative.       Objective:  Physical Exam Vitals and nursing note reviewed.  Constitutional:      Appearance: She is well-developed.  Pulmonary:     Effort: Pulmonary effort is normal.  Musculoskeletal:        General: Normal range of motion.  Skin:    General: Skin is warm.  Neurological:     Mental Status: She is alert.     Neurovascular status intact muscle strength was found to be adequate range of motion adequate with patient noted to have incurvated medial border right big toe sore when pressed mildly red no active drainage noted and is noted to have moderate swelling around the first MPJ right and lesion formation left.  Patient has good digital perfusion well oriented x 3     Assessment:  Ingrown toenail deformity right hallux with pain and no indication of infection with also probable gout or osteoarthritis     Plan:  H&P conditions reviewed discussed at this point for the right I have recommended correction nailbed and patient read over consent form signs understands risk infiltrated the right big toe 60 mg like Marcaine mixture the border was removed with sterile instrumentation and chemical applied 3 applications of phenol followed by alcohol lavage sterile dressing gave instructions on soaks and wear dressing 24 hours take it off earlier if throbbing were to occur.  All other conditions I do think are under control currently if flareups occur patient will be seen back

## 2023-01-17 ENCOUNTER — Telehealth: Payer: Self-pay | Admitting: Podiatry

## 2023-01-17 NOTE — Telephone Encounter (Signed)
Pt called and was not given the instructions for how to care for foot after procedure today.  I checked the AVS and it was in there and I have emailed it to the pt. Her email is [email or web address] She has requested it not be sent in a PDF format because she is unable to see the instructions

## 2023-01-26 ENCOUNTER — Ambulatory Visit (INDEPENDENT_AMBULATORY_CARE_PROVIDER_SITE_OTHER): Payer: Medicare HMO | Admitting: Podiatry

## 2023-01-26 ENCOUNTER — Encounter: Payer: Self-pay | Admitting: Podiatry

## 2023-01-26 DIAGNOSIS — L600 Ingrowing nail: Secondary | ICD-10-CM

## 2023-01-26 DIAGNOSIS — M779 Enthesopathy, unspecified: Secondary | ICD-10-CM | POA: Diagnosis not present

## 2023-01-26 DIAGNOSIS — M2041 Other hammer toe(s) (acquired), right foot: Secondary | ICD-10-CM

## 2023-01-26 DIAGNOSIS — M2042 Other hammer toe(s) (acquired), left foot: Secondary | ICD-10-CM

## 2023-01-26 MED ORDER — TRIAMCINOLONE ACETONIDE 10 MG/ML IJ SUSP
10.0000 mg | Freq: Once | INTRAMUSCULAR | Status: AC
  Administered 2023-01-26: 10 mg via INTRA_ARTICULAR

## 2023-01-27 ENCOUNTER — Ambulatory Visit: Payer: Medicare HMO | Admitting: Podiatry

## 2023-01-27 NOTE — Progress Notes (Signed)
Subjective:   Patient ID: Emily Perez, female   DOB: 67 y.o.   MRN: [id number]   HPI Patient presents stating she was concerned about hammertoe deformities or ingrown toenail is improving but wants it checked and she does get a lot of pain on top of both her feet   ROS      Objective:  Physical Exam  Neurovascular status intact inflammation pain with fluid buildup around the extensor tendon complex bilateral with patient noted to have digital deformities right over left rigid contracture digit to right over left foot     Assessment:  Several different problems with 1 being tendinitis which is more bothersome both feet ingrown toenail healing and chronic digital deformities bilateral     Plan:  H&P reviewed all conditions.  Ingrown toenail is healing normally and I explained that tendinitis is inflamed and I do think it is probable low-grade midtarsal joint arthritis and I have recommended injections and discussed and patient wants these understanding risk and I did sterile prep injected the dorsal midtarsal joint extensor complex 3 mg dexamethasone Kenalog 5 mg Xylocaine which can be done as needed.  Hammertoes could be corrected but at this point they are only moderately symptomatic and I would try to wear good right space shoes with mesh materials and hopefully that will keep this under control.  If symptoms get worse I did educate her on digital fusions

## 2023-02-23 DIAGNOSIS — Z1231 Encounter for screening mammogram for malignant neoplasm of breast: Secondary | ICD-10-CM | POA: Diagnosis not present

## 2023-02-23 DIAGNOSIS — M8588 Other specified disorders of bone density and structure, other site: Secondary | ICD-10-CM | POA: Diagnosis not present

## 2023-02-23 DIAGNOSIS — M810 Age-related osteoporosis without current pathological fracture: Secondary | ICD-10-CM | POA: Diagnosis not present

## 2023-02-27 ENCOUNTER — Encounter: Payer: Self-pay | Admitting: Hematology and Oncology

## 2023-02-28 ENCOUNTER — Telehealth: Payer: Self-pay | Admitting: *Deleted

## 2023-02-28 ENCOUNTER — Ambulatory Visit
Admission: RE | Admit: 2023-02-28 | Discharge: 2023-02-28 | Disposition: A | Discharge status: Home / Self Care | Payer: Medicare HMO | Source: Ambulatory Visit | Attending: Hematology and Oncology | Admitting: Hematology and Oncology

## 2023-02-28 ENCOUNTER — Ambulatory Visit: Payer: Medicare HMO

## 2023-02-28 DIAGNOSIS — N63 Unspecified lump in unspecified breast: Secondary | ICD-10-CM

## 2023-02-28 NOTE — Telephone Encounter (Signed)
Contacted patient with Dr. Remonia Richter response to patient's earlier mychart message:  Please inform patient that we alternate between mammogram and MRI. MRI is due in May, last one is 08/18/2022.  Provided patient with this information. She verbalized understanding.  Advised her that Dr. Al Pimple wants to see her either end of December or early January for follow up and that a scheduler will contact her soon to schedule it.  Schedule message sent

## 2023-03-01 ENCOUNTER — Telehealth: Payer: Self-pay | Admitting: Hematology and Oncology

## 2023-03-01 NOTE — Telephone Encounter (Signed)
Spoke with patient confirming upcoming appointment  

## 2023-03-07 ENCOUNTER — Other Ambulatory Visit: Payer: Self-pay | Admitting: *Deleted

## 2023-03-07 DIAGNOSIS — Z171 Estrogen receptor negative status [ER-]: Secondary | ICD-10-CM

## 2023-03-08 ENCOUNTER — Inpatient Hospital Stay: Payer: Medicare HMO

## 2023-03-08 ENCOUNTER — Encounter: Payer: Self-pay | Admitting: Hematology and Oncology

## 2023-03-08 ENCOUNTER — Inpatient Hospital Stay: Payer: Medicare HMO | Attending: Hematology and Oncology | Admitting: Hematology and Oncology

## 2023-03-08 VITALS — BP 130/77 | HR 74 | Temp 97.8°F | Resp 16 | Wt 150.8 lb

## 2023-03-08 DIAGNOSIS — Z8249 Family history of ischemic heart disease and other diseases of the circulatory system: Secondary | ICD-10-CM | POA: Insufficient documentation

## 2023-03-08 DIAGNOSIS — Z17421 Hormone receptor negative with human epidermal growth factor receptor 2 negative status: Secondary | ICD-10-CM | POA: Insufficient documentation

## 2023-03-08 DIAGNOSIS — Z841 Family history of disorders of kidney and ureter: Secondary | ICD-10-CM | POA: Diagnosis not present

## 2023-03-08 DIAGNOSIS — G629 Polyneuropathy, unspecified: Secondary | ICD-10-CM | POA: Insufficient documentation

## 2023-03-08 DIAGNOSIS — Z6826 Body mass index (BMI) 26.0-26.9, adult: Secondary | ICD-10-CM | POA: Diagnosis not present

## 2023-03-08 DIAGNOSIS — Z8349 Family history of other endocrine, nutritional and metabolic diseases: Secondary | ICD-10-CM | POA: Diagnosis not present

## 2023-03-08 DIAGNOSIS — C50511 Malignant neoplasm of lower-outer quadrant of right female breast: Secondary | ICD-10-CM

## 2023-03-08 DIAGNOSIS — Z803 Family history of malignant neoplasm of breast: Secondary | ICD-10-CM | POA: Insufficient documentation

## 2023-03-08 DIAGNOSIS — Z8042 Family history of malignant neoplasm of prostate: Secondary | ICD-10-CM | POA: Diagnosis not present

## 2023-03-08 DIAGNOSIS — R634 Abnormal weight loss: Secondary | ICD-10-CM | POA: Insufficient documentation

## 2023-03-08 DIAGNOSIS — D569 Thalassemia, unspecified: Secondary | ICD-10-CM | POA: Insufficient documentation

## 2023-03-08 DIAGNOSIS — Z808 Family history of malignant neoplasm of other organs or systems: Secondary | ICD-10-CM | POA: Insufficient documentation

## 2023-03-08 DIAGNOSIS — C50811 Malignant neoplasm of overlapping sites of right female breast: Secondary | ICD-10-CM | POA: Diagnosis not present

## 2023-03-08 DIAGNOSIS — K5900 Constipation, unspecified: Secondary | ICD-10-CM | POA: Diagnosis not present

## 2023-03-08 DIAGNOSIS — Z825 Family history of asthma and other chronic lower respiratory diseases: Secondary | ICD-10-CM | POA: Diagnosis not present

## 2023-03-08 DIAGNOSIS — Z83438 Family history of other disorder of lipoprotein metabolism and other lipidemia: Secondary | ICD-10-CM | POA: Diagnosis not present

## 2023-03-08 DIAGNOSIS — Z809 Family history of malignant neoplasm, unspecified: Secondary | ICD-10-CM | POA: Insufficient documentation

## 2023-03-08 DIAGNOSIS — Z79899 Other long term (current) drug therapy: Secondary | ICD-10-CM | POA: Diagnosis not present

## 2023-03-08 DIAGNOSIS — D509 Iron deficiency anemia, unspecified: Secondary | ICD-10-CM | POA: Insufficient documentation

## 2023-03-08 DIAGNOSIS — Z833 Family history of diabetes mellitus: Secondary | ICD-10-CM | POA: Insufficient documentation

## 2023-03-08 DIAGNOSIS — Z1722 Progesterone receptor negative status: Secondary | ICD-10-CM | POA: Insufficient documentation

## 2023-03-08 DIAGNOSIS — Z171 Estrogen receptor negative status [ER-]: Secondary | ICD-10-CM | POA: Diagnosis not present

## 2023-03-08 LAB — CBC WITH DIFFERENTIAL (CANCER CENTER ONLY)
Abs Immature Granulocytes: 0.01 10*3/uL (ref 0.00–0.07)
Basophils Absolute: 0.1 10*3/uL (ref 0.0–0.1)
Basophils Relative: 1 %
Eosinophils Absolute: 0.2 10*3/uL (ref 0.0–0.5)
Eosinophils Relative: 2 %
HCT: 35.4 % — ABNORMAL LOW (ref 36.0–46.0)
Hemoglobin: 11.3 g/dL — ABNORMAL LOW (ref 12.0–15.0)
Immature Granulocytes: 0 %
Lymphocytes Relative: 30 %
Lymphs Abs: 2 10*3/uL (ref 0.7–4.0)
MCH: 25 pg — ABNORMAL LOW (ref 26.0–34.0)
MCHC: 31.9 g/dL (ref 30.0–36.0)
MCV: 78.3 fL — ABNORMAL LOW (ref 80.0–100.0)
Monocytes Absolute: 0.7 10*3/uL (ref 0.1–1.0)
Monocytes Relative: 11 %
Neutro Abs: 3.7 10*3/uL (ref 1.7–7.7)
Neutrophils Relative %: 56 %
Platelet Count: 229 10*3/uL (ref 150–400)
RBC: 4.52 MIL/uL (ref 3.87–5.11)
RDW: 14.4 % (ref 11.5–15.5)
WBC Count: 6.7 10*3/uL (ref 4.0–10.5)
nRBC: 0 % (ref 0.0–0.2)

## 2023-03-08 LAB — CMP (CANCER CENTER ONLY)
ALT: 10 U/L (ref 0–44)
AST: 16 U/L (ref 15–41)
Albumin: 4.3 g/dL (ref 3.5–5.0)
Alkaline Phosphatase: 50 U/L (ref 38–126)
Anion gap: 6 (ref 5–15)
BUN: 29 mg/dL — ABNORMAL HIGH (ref 8–23)
CO2: 32 mmol/L (ref 22–32)
Calcium: 9.7 mg/dL (ref 8.9–10.3)
Chloride: 101 mmol/L (ref 98–111)
Creatinine: 1.19 mg/dL — ABNORMAL HIGH (ref 0.44–1.00)
GFR, Estimated: 49 mL/min — ABNORMAL LOW (ref >60)
Glucose, Bld: 67 mg/dL — ABNORMAL LOW (ref 70–99)
Potassium: 3.5 mmol/L (ref 3.5–5.1)
Sodium: 139 mmol/L (ref 135–145)
Total Bilirubin: 0.5 mg/dL (ref <1.2)
Total Protein: 7.4 g/dL (ref 6.5–8.1)

## 2023-03-08 NOTE — Progress Notes (Signed)
[facility]  [phone] [phone]     ID: Emily Perez DOB: December 24, 1955  MR#: [id number]  JYN#:[id number]  Patient Care Team: Noberto Retort, MD as PCP - General (Family Medicine) Magrinat, Valentino Hue, MD (Inactive) as Consulting Physician (Oncology) Emelia Loron, MD as Consulting Physician (General Surgery) Antony Blackbird, MD as Consulting Physician (Radiation Oncology) Nita Sells, MD (Dermatology) Geryl Rankins, MD as Consulting Physician (Obstetrics and Gynecology) Teryl Lucy, MD as Consulting Physician (Orthopedic Surgery) Rogelia Mire, MD (Inactive) as Physician Assistant (Radiology) Luciano Cutter, MD as Consulting Physician (Pulmonary Disease) Kalman Shan, MD as Consulting Physician (Pulmonary Disease) Glendale Chard, DO as Consulting Physician (Neurology) OTHER MD:  CHIEF COMPLAINT: Triple negative breast cancer; PALB2 mutation  CURRENT TREATMENT: intensified screening  INTERVAL HISTORY:  Emily Perez returns today for follow up of her triple negative breast cancer.  The patient, with a history of breast cancer, presents with recent weight loss and a change in appetite. The patient has been eating differently, with a diet now primarily consisting of seafood. The patient also reports constipation, which they attribute to recently started iron supplementation. The patient has noticed an increase in energy since starting the iron pills. The patient also mentions a recent mammogram, which required a retake due to initial unclear results. The patient expresses concern about the experience level of the technicians at their current mammogram provider and is considering switching providers. The patient also raises a concern about the doctor's information not being correctly listed in the [place] system. She otherwise feels well and will continue with intensive screening. Rest of the pertinent 10 point ROS reviewed and  negative   COVID 19 VACCINATION STATUS: Status post Modernax2, with booster November 2021   HISTORY OF CURRENT ILLNESS: From the original intake note:  Emily Perez had routine bilateral screening mammography at [facility] on 04/19/2017 showing a possible abnormality in the right breast. She underwent unilateral right diagnostic mammography with tomography and right breast ultrasonography at [facility] on 04/25/2017 showing: breast density category B.  In the right breast at the 6:00 radiant 2 cm from the nipple there was a 2 cm oval mass which by ultrasound measured 2.5 cm and was irregular and hypoechoic.  There were also at least 3 abnormal appearing lymph nodes in the right axilla.  Accordingly on 05/02/2017 she proceeded to biopsy of the right breast mass in question and lymph node sampling. The pathology from this procedure showed ([id number]): Invasive ductal carcinoma grade II. One lymph node positive for metastatic carcinoma (1/1). Prognostic indicators significant for: estrogen receptor, 0% negative and progesterone receptor, 0% negative. Proliferation marker Ki67 at 80%. HER2 not amplified with ratios HER2/CEP17 signals 1.29 and average copies per cell 2.25  The patient's subsequent history is as detailed below.   PAST MEDICAL HISTORY: Past Medical History:  Diagnosis Date   Allergic rhinitis    Arthritis    Breast cancer (HCC)    Family history of breast cancer    Family history of prostate cancer    Headache    migraines in the past   Hyperlipidemia    Hypertension    PONV (postoperative nausea and vomiting)    after 1st colonoscopy    PAST SURGICAL HISTORY: Past Surgical History:  Procedure Laterality Date   BREAST LUMPECTOMY WITH RADIOACTIVE SEED AND SENTINEL LYMPH NODE BIOPSY Right 08/30/2017   Procedure: RIGHT BREAST RADIOACTIVE SEED GUIDED LUMPECTOMY WITH RIGHT RADIOACTIVE SEED TARGETED AXILLARY LYMPH NODE EXCISION AND RIGHT SENTINEL LYMPH NODE  BIOPSY;  Surgeon:  Emelia Loron, MD;  Location: [facility] OR;  Service: General;  Laterality: Right;   COLONOSCOPY     FOOT SURGERY Bilateral    FRACTURE SURGERY Left    wrist   PORTACATH PLACEMENT Right 05/19/2017   Procedure: INSERTION PORT-A-CATH WITH ULTRASOUND;  Surgeon: Emelia Loron, MD;  Location: [facility] OR;  Service: General;  Laterality: Right;   TUBAL LIGATION     WISDOM TOOTH EXTRACTION     Wrist Fracture. Torn Rotator cuff and torn meniscus.    FAMILY HISTORY Family History  Problem Relation Age of Onset   Heart disease Mother    Cancer Father    Diabetes Father    Hypertension Father    Hyperlipidemia Father    Emphysema Father    Heart disease Brother    Hypertension Brother    Hyperlipidemia Brother    Diabetes Brother    Prostate cancer Brother 74   Breast cancer Sister 26   Breast cancer Sister 71   Asthma Sister    Cancer Paternal Aunt        unsure what type of cancer she had   Prostate cancer Paternal Uncle    Diabetes Brother    Gout Brother    Kidney disease Brother    Lung disease Sister    Breast cancer Sister   The patient's father died at age 25 due to heart disease and emphysema. The patient's mother died at age 17 due to heart disease. The patient has 6 brothers and 8 sisters. She notes 2 sisters with breast cancer. The 1st sister was diagnosed at age 33, and the 2nd sister was diagnosed at age 77. She notes that both sisters are alive. She also notes a paternal first cousin with breast cancer diagnosed in her 51's. The patient notes that her father also had bone cancer, but she's unsure if he had myeloma. She denies a family history of ovarian cancer.    GYNECOLOGIC HISTORY:  No LMP recorded. Patient is postmenopausal. 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 She is divorced. She lives  by herself with no pets. The patient's daughter, Emily Perez, works in Clinical biochemist for [employer]. The patient's son, Emily Perez,  works for [employer]. The patient has  2 grandchildren. She belongs to [facility].     ADVANCED DIRECTIVES: Not in place.  At the 05/19/2017 visit the patient was given the appropriate documents to complete and notarized at her discretion   HEALTH MAINTENANCE: Social History   Tobacco Use   Smoking status: Never   Smokeless tobacco: Never  Vaping Use   Vaping status: Never Used  Substance Use Topics   Alcohol use: No   Drug use: No     Colonoscopy:   PAP: September 2018 normal  Bone density: none   Allergies  Allergen Reactions   Latex Hives and Rash    Current Outpatient Medications  Medication Sig Dispense Refill   aspirin 81 MG tablet Take 81 mg by mouth 3 (three) times a week.      doxylamine, Sleep, (UNISOM) 25 MG tablet Take 50 mg by mouth at bedtime as needed.     meclizine (ANTIVERT) 12.5 MG tablet      Multiple Vitamins-Minerals (MULTIVITAMIN WITH MINERALS) tablet Take 1 tablet by mouth daily.  triamterene-hydrochlorothiazide (DYAZIDE) 37.5-25 MG capsule Take 1 capsule by mouth daily.  3   zaleplon (SONATA) 5 MG capsule TK 1 C PO QD HS PRN  5   No current facility-administered medications for this visit.    OBJECTIVE: African-American woman who appears well  Vitals:   03/08/23 0822  BP: 130/77  Pulse: 74  Resp: 16  Temp: 97.8 F (36.6 C)  SpO2: 100%      Body mass index is 26.71 kg/m.   Wt Readings from Last 3 Encounters:  03/08/23 150 lb 12.8 oz (68.4 kg)  12/06/22 155 lb 6.4 oz (70.5 kg)  03/03/22 152 lb 14.4 oz (69.4 kg)   ECOG FS:1 - Symptomatic but completely ambulatory   Physical Exam Constitutional:      Appearance: Normal appearance.  Chest:     Comments: Bilateral breasts examined. No palpable masses or regional adenopathy. Post lumpectomy changes noted right breast. Musculoskeletal:         General: No swelling or tenderness.     Cervical back: Normal range of motion and neck supple. No rigidity.  Lymphadenopathy:     Cervical: No cervical adenopathy.  Skin:    General: Skin is warm and dry.  Neurological:     General: No focal deficit present.     Mental Status: She is alert.  Psychiatric:        Mood and Affect: Mood normal.     LAB RESULTS:  CMP     Component Value Date/Time   NA 139 03/08/2023 0729   K 3.5 03/08/2023 0729   CL 101 03/08/2023 0729   CO2 32 03/08/2023 0729   GLUCOSE 67 (L) 03/08/2023 0729   BUN 29 (H) 03/08/2023 0729   CREATININE 1.19 (H) 03/08/2023 0729   CALCIUM 9.7 03/08/2023 0729   PROT 7.4 03/08/2023 0729   ALBUMIN 4.3 03/08/2023 0729   AST 16 03/08/2023 0729   ALT 10 03/08/2023 0729   ALKPHOS 50 03/08/2023 0729   BILITOT 0.5 03/08/2023 0729   GFRNONAA 49 (L) 03/08/2023 0729   GFRAA >60 02/22/2019 1239   GFRAA 46 (L) 08/03/2017 1114    Lab Results  Component Value Date   WBC 6.7 03/08/2023   NEUTROABS 3.7 03/08/2023   HGB 11.3 (L) 03/08/2023   HCT 35.4 (L) 03/08/2023   MCV 78.3 (L) 03/08/2023   PLT 229 03/08/2023    No results found for: "LABCA2"  No components found for: "QIONGE952"  No results for input(s): "INR" in the last 168 hours.  No results found for: "LABCA2"  No results found for: "WUX324"  No results found for: "CAN125"  No results found for: "CAN153"  No results found for: "CA2729"  No components found for: "HGQUANT"  No results found for: "CEA1", "CEA" / No results found for: "CEA1", "CEA"   No results found for: "AFPTUMOR"  No results found for: "CHROMOGRNA"  No results found for: "TOTALPROTELP", "ALBUMINELP", "A1GS", "A2GS", "BETS", "BETA2SER", "GAMS", "MSPIKE", "SPEI" (this displays SPEP labs)  No results found for: "KPAFRELGTCHN", "LAMBDASER", "KAPLAMBRATIO" (kappa/lambda light chains)  No results found for: "HGBA", "HGBA2QUANT", "HGBFQUANT", "HGBSQUAN" (Hemoglobinopathy  evaluation)   No results found for: "LDH"  Lab Results  Component Value Date   IRON 170 (H) 07/12/2017   TIBC 303 07/12/2017   IRONPCTSAT 56 07/12/2017   (Iron and TIBC)  Lab Results  Component Value Date   FERRITIN 685 (H) 07/12/2017    Urinalysis No results found for: "COLORURINE", "APPEARANCEUR", "LABSPEC", "PHURINE", "GLUCOSEU", "HGBUR", "BILIRUBINUR", "KETONESUR", "PROTEINUR", "  UROBILINOGEN", "NITRITE", "LEUKOCYTESUR"   STUDIES: No results found.   ELIGIBLE FOR AVAILABLE RESEARCH PROTOCOL: no  ASSESSMENT: 67 y.o. [place] Woman status post right breast upper outer quadrant biopsy 05/02/2017 for a clinical T2 N1-2, stage IIIB invasive ductal carcinoma, grade 2-3, triple negative, with an MIB-1 80%  (a) breast MRI 05/18/2017 shows T3 N1 disease with possible involvement of the internal mammary nodes  (1) neoadjuvant chemotherapy consisting of doxorubicin and cyclophosphamide in dose dense fashion x4 starting 05/24/2017, completed 07/05/2017  (a) planned carboplatin/paclitaxel x12 omitted secondary to peripheral neuropathy concerns  (2) status post right lumpectomy 08/30/2017 showing a complete pathologic response (ypT0 ypN0)  (a) a total of 5 lymph nodes were removed  (3) adjuvant radiation : 10/12/2017-11/28/2017 Site/dose:   1. Right breast, Ax_SCV, 1.8 Gy in 25 fractions for a total dose of 45 Gy.                       2. Right breast, 1.8 Gy in 28 fractions for a total dose of 50.4 Gy                      3. Boost, 2 Gy in 5 fractions for a total dose of 10 Gy  (4) Genetics testing offered through [facility] Multi-cancer Panel on 08/03/2017 showed a pathogenic variant in PALB2 ([id number].[id number] (p.Gln60Argfs*7)  (a) there was a VUS identified in CDH1  (b) no additional deleterious mutations were noted in ALK, APC, ATM, AXIN2, BAP1, BARD1, BLM, BMPR1A, BRCA1, BRCA2, BRIP1, CASR, CDC73, CDH1, CDK4, CDKN1B, CDKN1C, CDKN2A (p14ARF), CDKN2A (p16INK4a), CEBPA, CHEK2, CTNNA1,  DICER1, DIS3L2, EPCAM*, FH, FLCN, GATA2, GPC3, GREM1*, HRAS, KIT, MAX, MEN1, MET, MLH1, MSH2, MSH3, MSH6, MUTYH, NBN, NF1, NF2, PALB2, PDGFRA, PHOX2B*, PMS2, POLD1, POLE, POT1, PRKAR1A, PTCH1, PTEN, RAD50, RAD51C, RAD51D, RB1, RECQL4, RET, RUNX1, SDHAF2, SDHB, SDHC, SDHD, SMAD4, SMARCA4, SMARCB1, SMARCE1, STK11, SUFU, TERC, TERT, TMEM127, TP53, TSC1, TSC2, VHL, WRN*, WT1. The following genes were evaluated for sequence changes only: EGFR*, HOXB13*, MITF*, NTHL1*, SDHA Results are negative unless otherwise indicated  (c) per NCCN guidelines, there is insufficient data to associate PALB2 mutations with increased ovarian cancer risk; this is managed according to family history  (5) PALB2: intensified screening:  (a) mammography with tomography every November and see Dr. Dwain Sarna December  (b) breast MRI every May and see me in June  (c) family is aware and those who wish to be tested have been tested  (6) PALB2: breast cancer prophylaxis:   (a) anastrozole October 2019, discontinued after a few weeks secondary to hair loss and other symptoms.  (7) thalassemia: With MCV of 76.5 and ferritin 685 on April 2019   PLAN:  Breast cancer Recent mammogram with no concerning findings. Continue intensive screening, mammo alternating with MRI -Schedule MRI for May. -RTC in 1 yr.  Iron Deficiency Anemia Hemoglobin stable at 11.3, possibly due to iron deficiency. Patient recently started iron supplementation but experiencing constipation. -Advise to take iron supplement every other day for at least three months. -Continue use of prune juice and fiber pills for constipation.  Weight Loss Noted significant weight loss, possibly due to dietary changes and decreased appetite. -Monitor weight and appetite closely.  Insurance Issue Patient reports that insurance does not recognize provider as part of [facility]. -Provider to follow up with [facility] to resolve  issue.  Follow-up Annual visit scheduled for December 2025. -Order labs prior to next visit.  Total time spent:  30 minutes  *Total Encounter Time as defined by the Centers for Medicare and Medicaid Services includes, in addition to the face-to-face time of a patient visit (documented in the note above) non-face-to-face time: obtaining and reviewing outside history, ordering and reviewing medications, tests or procedures, care coordination (communications with other health care professionals or caregivers) and documentation in the medical record.

## 2023-03-09 DIAGNOSIS — L03031 Cellulitis of right toe: Secondary | ICD-10-CM | POA: Diagnosis not present

## 2023-03-10 ENCOUNTER — Ambulatory Visit: Payer: Medicare HMO | Admitting: Podiatry

## 2023-03-18 DIAGNOSIS — M2042 Other hammer toe(s) (acquired), left foot: Secondary | ICD-10-CM | POA: Diagnosis not present

## 2023-03-18 DIAGNOSIS — M722 Plantar fascial fibromatosis: Secondary | ICD-10-CM | POA: Diagnosis not present

## 2023-03-18 DIAGNOSIS — M2041 Other hammer toe(s) (acquired), right foot: Secondary | ICD-10-CM | POA: Diagnosis not present

## 2023-03-22 ENCOUNTER — Encounter: Payer: Self-pay | Admitting: Hematology and Oncology

## 2023-04-06 DIAGNOSIS — L03031 Cellulitis of right toe: Secondary | ICD-10-CM | POA: Diagnosis not present

## 2023-04-06 DIAGNOSIS — M722 Plantar fascial fibromatosis: Secondary | ICD-10-CM | POA: Diagnosis not present

## 2023-04-08 ENCOUNTER — Encounter: Payer: Self-pay | Admitting: Internal Medicine

## 2023-04-12 DIAGNOSIS — D649 Anemia, unspecified: Secondary | ICD-10-CM | POA: Diagnosis not present

## 2023-04-13 DIAGNOSIS — L308 Other specified dermatitis: Secondary | ICD-10-CM | POA: Diagnosis not present

## 2023-04-13 DIAGNOSIS — L818 Other specified disorders of pigmentation: Secondary | ICD-10-CM | POA: Diagnosis not present

## 2023-04-14 ENCOUNTER — Telehealth: Payer: Self-pay | Admitting: Internal Medicine

## 2023-04-14 NOTE — Telephone Encounter (Signed)
Pt has left this message in the chart and has not been addressed by Dr Marchelle Gearing. Please advise note below.       Hi Dr. Marchelle Gearing   I recently made a request to [facility] to increase insurance.  [employer]  denied this increase...due to the following reasons:   - Lung Disease  -Pulmonary Interstitial Fibrosis   As you and I discussed and has been previously determined...that I have not been diagnosed with the above-stated diseases.   Dr. Marchelle Gearing, If notations of these diagnoses are in my medical record...would you please remove and make corrections.  Additionally, would you please write a letter (To Whom It May Concern) on your business letterhead addressed to [employer] [place]... that definitively clarifies my correct diagnoses.   If your need further documentation re this matter.Marland KitchenMarland KitchenI can provide a copy of the denial letter along with a copy of the email...which includes the above diagnoses for denial.  I would like to arrange to pick up letter...at your office...at your earliest opportunity.   Thank you for your time and consideration. [name]

## 2023-04-14 NOTE — Telephone Encounter (Signed)
Patient checking on patient message for life insurance denial. Patient phone number is [phone].

## 2023-04-15 NOTE — Telephone Encounter (Signed)
'  s triage please read out this letter to the patient.  If she is not happy with it if she wants any changes let me know.  If she is happy with that then please put it on her document and I can sign it   xxxx  To whom it may concern   Re: Emily Perez with date of birth 1955/05/08 and address: [address] [name] [address] and diagnosis of interstitial lung disease or pulmonary fibrosis.  I have known above-named individual under my care since February 2021 and in our practice at least since August 2020.  My most recent visit December 06, 2022 based on essentially asymptomatic state, normal exercise hypoxemia test, I gave her a clean bill of health regarding her lungs.  She was given a diagnosis of interstitial lung disease but this is incorrect.  In her recent CT chest in 2021 what she has is a small area of postradiation fibrosis which is not expected to get worse.  It is not the same thing as pulmonary fibrosis disease state.  It is not the same thing as interstitial lung disease.  Therefore you should remove this diagnosis from your actuarial analysis for insurance reasons.  As I mentioned to you you should consider lungs as normal healthy state.  The radiation fibrosis is very minimal and is not impacting her functional status now or in the future    Please do not hesitate to contact me if you have any questions   Sincerely yours     SIGNATURE    Dr. Kalman Shan, M.D., F.C.C.P,  Pulmonary and Critical Care Medicine Staff Physician, [facility] Director - Interstitial Lung Disease  Program  Pulmonary Fibrosis [facility] at [facility] [place], [place], [address]   Pager: [phone], If no answer  -> Check AMION or Try [phone] Telephone (clinical office): [phone] Telephone (research): [phone]  4:59 PM 04/15/2023

## 2023-04-18 ENCOUNTER — Encounter: Payer: Self-pay | Admitting: *Deleted

## 2023-04-18 NOTE — Telephone Encounter (Signed)
Letter done, printed and signed by MR and placed up front for pick up  Sent her a mychart msg letting her know

## 2023-04-18 NOTE — Telephone Encounter (Signed)
I did a letter.

## 2023-04-18 NOTE — Telephone Encounter (Signed)
I read letter to PT and she would like to pick this letter up today. She says the letter is very satisfactory to her. Triage- Please see Dr. Charlean Sanfilippo notes about how to move fwd with this request. Once completed put in the file cabinet down here behind the front desk.

## 2023-05-25 ENCOUNTER — Encounter: Payer: Self-pay | Admitting: Genetic Counselor

## 2023-05-26 ENCOUNTER — Encounter: Payer: Self-pay | Admitting: Internal Medicine

## 2023-06-03 ENCOUNTER — Other Ambulatory Visit: Payer: Self-pay

## 2023-06-03 ENCOUNTER — Telehealth: Payer: Self-pay

## 2023-06-03 DIAGNOSIS — C50511 Malignant neoplasm of lower-outer quadrant of right female breast: Secondary | ICD-10-CM

## 2023-06-03 NOTE — Telephone Encounter (Signed)
 Pt called regarding MRI which needs to be scheduled for may. Pt was provided [facility] phone number to schedule. She verbalized thanks and understanding.

## 2023-06-06 ENCOUNTER — Other Ambulatory Visit: Payer: Medicare HMO

## 2023-06-07 ENCOUNTER — Other Ambulatory Visit: Payer: Self-pay | Admitting: *Deleted

## 2023-06-07 DIAGNOSIS — C50511 Malignant neoplasm of lower-outer quadrant of right female breast: Secondary | ICD-10-CM

## 2023-06-14 ENCOUNTER — Ambulatory Visit (INDEPENDENT_AMBULATORY_CARE_PROVIDER_SITE_OTHER): Payer: Medicare HMO | Admitting: Neurology

## 2023-06-14 ENCOUNTER — Encounter: Payer: Self-pay | Admitting: Neurology

## 2023-06-14 VITALS — BP 131/71 | HR 73 | Ht 63.0 in | Wt 147.0 lb

## 2023-06-14 DIAGNOSIS — M79671 Pain in right foot: Secondary | ICD-10-CM

## 2023-06-14 DIAGNOSIS — M79672 Pain in left foot: Secondary | ICD-10-CM | POA: Diagnosis not present

## 2023-06-14 DIAGNOSIS — R200 Anesthesia of skin: Secondary | ICD-10-CM | POA: Diagnosis not present

## 2023-06-14 DIAGNOSIS — G8929 Other chronic pain: Secondary | ICD-10-CM | POA: Diagnosis not present

## 2023-06-14 DIAGNOSIS — G5601 Carpal tunnel syndrome, right upper limb: Secondary | ICD-10-CM

## 2023-06-14 NOTE — Patient Instructions (Addendum)
 Nerve testing of bilateral arms and legs  ELECTROMYOGRAM AND NERVE CONDUCTION STUDIES (EMG/NCS) INSTRUCTIONS  How to Prepare The neurologist conducting the EMG will need to know if you have certain medical conditions. Tell the neurologist and other EMG lab personnel if you: Have a pacemaker or any other electrical medical device Take blood-thinning medications Have hemophilia, a blood-clotting disorder that causes prolonged bleeding Bathing Take a shower or bath shortly before your exam in order to remove oils from your skin. Don't apply lotions or creams before the exam.  What to Expect You'll likely be asked to change into a hospital gown for the procedure and lie down on an examination table. The following explanations can help you understand what will happen during the exam.  Electrodes. The neurologist or a technician places surface electrodes at various locations on your skin depending on where you're experiencing symptoms. Or the neurologist may insert needle electrodes at different sites depending on your symptoms.  Sensations. The electrodes will at times transmit a tiny electrical current that you may feel as a twinge or spasm. The needle electrode may cause discomfort or pain that usually ends shortly after the needle is removed. If you are concerned about discomfort or pain, you may want to talk to the neurologist about taking a short break during the exam.  Instructions. During the needle EMG, the neurologist will assess whether there is any spontaneous electrical activity when the muscle is at rest - activity that isn't present in healthy muscle tissue - and the degree of activity when you slightly contract the muscle.  He or she will give you instructions on resting and contracting a muscle at appropriate times. Depending on what muscles and nerves the neurologist is examining, he or she may ask you to change positions during the exam.  After your EMG You may experience some  temporary, minor bruising where the needle electrode was inserted into your muscle. This bruising should fade within several days. If it persists, contact your primary care doctor.

## 2023-06-14 NOTE — Progress Notes (Signed)
 Follow-up Visit   Date: 06/14/23   Emily Perez MRN: [id number] DOB: 12/22/1955   Interim History: Emily Perez is a 68 y.o. right-handed female with right breast cancer (2019), hyperlipidemia, and hypertension returning to the clinic for follow-up of hand paresthesias.  The patient was accompanied to the clinic by self.   IMPRESSION/PLAN: Bilateral hand and feet pain is described as throbbing and achy, which is more suggestive of musculoskeletal etiology, moreso than nerve pathology.  She has history of right carpal tunnel syndrome and mild left cubital tunnel syndrome, so her hand paresthesias can be explained by this.  I will obtain repeat NCS/EMG of bilateral arms and legs to help characterize the nature of her symptoms. Chemotherapy-induced neuropathy, improved.  She does not have any sensorimotor deficits on exam today.  Further recommendations pending results.  ------------------------------------------ History of present illness:  In 2019, she starting having tingling and numbness in the hands >> feet with chemotherapy (doxorubicin, cyclophosphamide, carboplatin/paclitaxel).  It is worse over the fingers and toes. Over the past few years, the intensity of tingling has improved by 80%, and she remains bothered by the tingling in the hands, which is worse than the legs.  She denies weakness, imbalance, or falls.  She does not take any pain medications, besides tylenol as needed.   UPDATE 09/01/2020: She is here for follow-up visit.  Her EMG shows R CTS and L ulnar neuropathy at the elbow.  Her hands have not been bothering her as much lately, so she has not been using the brace.  Overall, she feels that her symptoms are a little better than before.  No new weakness, neck pain, or falls.  Numbness in the feet is mild and unchanged.  No new complaints.  UPDATE 06/14/2023:  She reports having achy and throbbing pain involving the arch of the foot in the left.  She also  similar achy pain over the dorsum of both feet.  Symptoms are worse with walking and improved at rest.  She reports having improved numbness/tingling of the feet. She has seen podiatry who felt symptoms were arthritis.   She also complains of weakness in the hands and joint pain, which is worse after lifting items.  Pain is stiff and achy.  She also has some numbness in the left.      Medications:  Current Outpatient Medications on File Prior to Visit  Medication Sig Dispense Refill   aspirin 81 MG tablet Take 81 mg by mouth 3 (three) times a week.      doxylamine, Sleep, (UNISOM) 25 MG tablet Take 50 mg by mouth at bedtime as needed.     meclizine (ANTIVERT) 12.5 MG tablet      Multiple Vitamins-Minerals (MULTIVITAMIN WITH MINERALS) tablet Take 1 tablet by mouth daily.     triamterene-hydrochlorothiazide (DYAZIDE) 37.5-25 MG capsule Take 1 capsule by mouth daily.  3   zaleplon (SONATA) 5 MG capsule TK 1 C PO QD HS PRN  5   No current facility-administered medications on file prior to visit.    Allergies:  Allergies  Allergen Reactions   Latex Hives and Rash    Vital Signs:  There were no vitals taken for this visit.    Neurological Exam: MENTAL STATUS including orientation to time, place, person, recent and remote memory, attention span and concentration, language, and fund of knowledge is normal.  Speech is not dysarthric.  CRANIAL NERVES:    Normal conjugate, extra-ocular eye movements in all directions of  gaze.  No ptosis.   MOTOR:  Motor strength is 5/5 in all extremities.  No atrophy, fasciculations or abnormal movements.  No pronator drift.  Tone is normal.    MSRs:  Reflexes are 2+/4 throughout.  Rhomberg testing is negative.  SENSORY:  Intact to vibration, pin prick, and temperature throughout.  Stressed and tandem gait intact.  COORDINATION/GAIT:  Gait narrow based and stable.   Data: NCS/EMG of the arms 07/22/2020: Right median neuropathy at or distal to the wrist  (moderate), consistent with a clinical diagnosis of carpal tunnel syndrome.   Left ulnar neuropathy with slowing across the elbow, purely demyelinating, mild. There is no evidence of a sensorimotor polyneuropathy or cervical radiculopathy affecting the upper extremities.     Thank you for allowing me to participate in patient's care.  If I can answer any additional questions, I would be pleased to do so.    Sincerely,    Sharla Tankard K. Allena Katz, DO

## 2023-06-18 ENCOUNTER — Emergency Department (HOSPITAL_COMMUNITY)
Admission: EM | Admit: 2023-06-18 | Discharge: 2023-06-18 | Disposition: A | Discharge status: Home / Self Care | Attending: Emergency Medicine | Admitting: Emergency Medicine

## 2023-06-18 ENCOUNTER — Encounter (HOSPITAL_COMMUNITY): Payer: Self-pay

## 2023-06-18 ENCOUNTER — Other Ambulatory Visit: Payer: Self-pay

## 2023-06-18 DIAGNOSIS — Z79899 Other long term (current) drug therapy: Secondary | ICD-10-CM | POA: Diagnosis not present

## 2023-06-18 DIAGNOSIS — Z7982 Long term (current) use of aspirin: Secondary | ICD-10-CM | POA: Insufficient documentation

## 2023-06-18 DIAGNOSIS — I10 Essential (primary) hypertension: Secondary | ICD-10-CM | POA: Diagnosis not present

## 2023-06-18 DIAGNOSIS — Z91040 Latex allergy status: Secondary | ICD-10-CM | POA: Insufficient documentation

## 2023-06-18 LAB — CBC WITH DIFFERENTIAL/PLATELET
Abs Immature Granulocytes: 0.14 10*3/uL — ABNORMAL HIGH (ref 0.00–0.07)
Basophils Absolute: 0.1 10*3/uL (ref 0.0–0.1)
Basophils Relative: 1 %
Eosinophils Absolute: 0 10*3/uL (ref 0.0–0.5)
Eosinophils Relative: 0 %
HCT: 35.2 % — ABNORMAL LOW (ref 36.0–46.0)
Hemoglobin: 11.1 g/dL — ABNORMAL LOW (ref 12.0–15.0)
Immature Granulocytes: 2 %
Lymphocytes Relative: 11 %
Lymphs Abs: 1 10*3/uL (ref 0.7–4.0)
MCH: 25 pg — ABNORMAL LOW (ref 26.0–34.0)
MCHC: 31.5 g/dL (ref 30.0–36.0)
MCV: 79.3 fL — ABNORMAL LOW (ref 80.0–100.0)
Monocytes Absolute: 0.6 10*3/uL (ref 0.1–1.0)
Monocytes Relative: 6 %
Neutro Abs: 7.8 10*3/uL — ABNORMAL HIGH (ref 1.7–7.7)
Neutrophils Relative %: 80 %
Platelets: 220 10*3/uL (ref 150–400)
RBC: 4.44 MIL/uL (ref 3.87–5.11)
RDW: 14.6 % (ref 11.5–15.5)
WBC: 9.6 10*3/uL (ref 4.0–10.5)
nRBC: 0 % (ref 0.0–0.2)

## 2023-06-18 LAB — COMPREHENSIVE METABOLIC PANEL WITH GFR
ALT: 15 U/L (ref 0–44)
AST: 27 U/L (ref 15–41)
Albumin: 4.1 g/dL (ref 3.5–5.0)
Alkaline Phosphatase: 49 U/L (ref 38–126)
Anion gap: 10 (ref 5–15)
BUN: 27 mg/dL — ABNORMAL HIGH (ref 8–23)
CO2: 26 mmol/L (ref 22–32)
Calcium: 10.1 mg/dL (ref 8.9–10.3)
Chloride: 100 mmol/L (ref 98–111)
Creatinine, Ser: 1.13 mg/dL — ABNORMAL HIGH (ref 0.44–1.00)
GFR, Estimated: 53 mL/min — ABNORMAL LOW (ref >60)
Glucose, Bld: 106 mg/dL — ABNORMAL HIGH (ref 70–99)
Potassium: 3.5 mmol/L (ref 3.5–5.1)
Sodium: 136 mmol/L (ref 135–145)
Total Bilirubin: 1 mg/dL (ref 0.0–1.2)
Total Protein: 7.8 g/dL (ref 6.5–8.1)

## 2023-06-18 MED ORDER — HYDRALAZINE HCL 25 MG PO TABS
25.0000 mg | ORAL_TABLET | Freq: Three times a day (TID) | ORAL | 0 refills | Status: AC | PRN
Start: 2023-06-18 — End: ?

## 2023-06-18 MED ORDER — HYDRALAZINE HCL 25 MG PO TABS
25.0000 mg | ORAL_TABLET | Freq: Once | ORAL | Status: DC | PRN
  Filled 2023-06-18: qty 1

## 2023-06-18 NOTE — Discharge Instructions (Addendum)
 I prescribed you a rescue medicine for high blood pressure, hydralazine.  You can take this tablet once every 8 hours as needed for high blood pressure.  You should check your blood pressure twice a day, keeping a daily diary.  Check it in the morning and in the evening.  If your systolic blood pressure or top number is over 180 mmhg (on two repeat checks, 10 minutes apart), you can take 25 mg hydralazine.  Do not exceed the recommended dose.    Follow-up with your primary care doctor in 1 week in the office.  Bring your diary of your blood pressure levels with you to the office.  It is common for blood pressure to normalize after several days, in which case you will not need the hydralazine any longer.

## 2023-06-18 NOTE — ED Triage Notes (Signed)
 Pt states that her bp has been going up over the past 3 days even with medication compliance.

## 2023-06-18 NOTE — ED Provider Notes (Signed)
 [place] EMERGENCY DEPARTMENT AT [facility] Provider Note   CSN: [id number] Arrival date & time: 06/18/23  1351     History  Chief Complaint  Patient presents with   Hypertension    Emily Perez is a 68 y.o. female with history of hypertension presented to ED with high blood pressure.  Patient checks her blood pressure regularly every day.  She says typically her systolic blood pressures run around 120.  She has noticed for the past 3 days her blood pressure has been climbing, up to 180 systolic.  She denies headache, blurred vision, chest pain or discomfort.  She denies any changes in her diet.  She reports she has been under significant stress with family issues with her son as well as sickness of a close friend.  She has been taking all of her blood pressure medicine, which is a combination triamterene hydrochlorothiazide tablet.  She has been on this medicine for many years and well-controlled.  HPI     Home Medications Prior to Admission medications   Medication Sig Start Date End Date Taking? Authorizing Provider  hydrALAZINE (APRESOLINE) 25 MG tablet Take 1 tablet (25 mg total) by mouth 3 (three) times daily as needed for up to 30 doses. For systolic blood pressure over 180 mmhg on 2 repeat checks 06/18/23  Yes Cyncere Sontag, Kermit Balo, MD  aspirin 81 MG tablet Take 81 mg by mouth 3 (three) times a week.     [provider]  doxylamine, Sleep, (UNISOM) 25 MG tablet Take 50 mg by mouth at bedtime as needed.    [provider]  meclizine (ANTIVERT) 12.5 MG tablet  02/03/18   [provider]  Multiple Vitamins-Minerals (MULTIVITAMIN WITH MINERALS) tablet Take 1 tablet by mouth daily.    [provider]  triamterene-hydrochlorothiazide (DYAZIDE) 37.5-25 MG capsule Take 1 capsule by mouth daily. 03/11/17   [provider]  zaleplon (SONATA) 5 MG capsule TK 1 C PO QD HS PRN Patient not taking: Reported on 06/14/2023 10/14/17    [provider]      Allergies    Latex    Review of Systems   Review of Systems  Physical Exam Updated Vital Signs BP 139/80 (BP Location: Left Arm)   Pulse 77   Temp 98.1 F (36.7 C) (Oral)   Resp 17   SpO2 100%  Physical Exam Constitutional:      General: She is not in acute distress. HENT:     Head: Normocephalic and atraumatic.  Eyes:     Conjunctiva/sclera: Conjunctivae normal.     Pupils: Pupils are equal, round, and reactive to light.  Cardiovascular:     Rate and Rhythm: Normal rate and regular rhythm.  Pulmonary:     Effort: Pulmonary effort is normal. No respiratory distress.  Abdominal:     General: There is no distension.     Tenderness: There is no abdominal tenderness.  Skin:    General: Skin is warm and dry.  Neurological:     General: No focal deficit present.     Mental Status: She is alert. Mental status is at baseline.  Psychiatric:        Mood and Affect: Mood normal.        Behavior: Behavior normal.     ED Results / Procedures / Treatments   Labs (all labs ordered are listed, but only abnormal results are displayed) Labs Reviewed  CBC WITH DIFFERENTIAL/PLATELET - Abnormal; Notable for the following components:  Result Value   Hemoglobin 11.1 (*)    HCT 35.2 (*)    MCV 79.3 (*)    MCH 25.0 (*)    Neutro Abs 7.8 (*)    Abs Immature Granulocytes 0.14 (*)    All other components within normal limits  COMPREHENSIVE METABOLIC PANEL WITH GFR - Abnormal; Notable for the following components:   Glucose, Bld 106 (*)    BUN 27 (*)    Creatinine, Ser 1.13 (*)    GFR, Estimated 53 (*)    All other components within normal limits    EKG EKG Interpretation Date/Time:  Saturday June 18 2023 14:18:41 EDT Ventricular Rate:  82 PR Interval:  139 QRS Duration:  93 QT Interval:  409 QTC Calculation: 478 R Axis:   -41  Text Interpretation: Sinus rhythm Atrial premature complexes Probable left atrial enlargement Left axis  deviation No significant change since prior 2/19 Confirmed by Meridee Score [phone]) on 06/18/2023 2:22:39 PM  Radiology No results found.  Procedures Procedures    Medications Ordered in ED Medications  hydrALAZINE (APRESOLINE) tablet 25 mg (has no administration in time range)    ED Course/ Medical Decision Making/ A&P                                 Medical Decision Making Amount and/or Complexity of Data Reviewed Labs: ordered.  Risk Prescription drug management.   This is a well-appearing patient here with asymptomatic hypertension.  Patient is a reliable historian and takes her blood pressure regularly at home.  Blood pressure remains elevated in the ED.  We discussed the importance of PCP follow-up.  Generally we do not medicate for asymptomatic hypertension in the ED, but the patient has reliable and checking her blood pressure, and would strongly prefer having a rescue medicine.  We discussed 25 mg hydralazine 3 times daily as needed as a temporary measure, only if her blood pressures persistently elevated at home.  These guidelines were clearly delineated to her in her discharge summary.  She will need follow-up with her PCP.  I personally reviewed interpreted patient's labs and imaging.  No emergent findings.  Low suspicion for ACS, aortic dissection, stroke.  No indication for neuroimaging at this time.  Patient has mild chronic baseline anemia.  EKG does not show any acute ischemic findings per my interpretation        Final Clinical Impression(s) / ED Diagnoses Final diagnoses:  Hypertension, unspecified type    Rx / DC Orders ED Discharge Orders          Ordered    hydrALAZINE (APRESOLINE) 25 MG tablet  3 times daily PRN        06/18/23 1702              Terald Sleeper, MD 06/18/23 1738

## 2023-06-20 DIAGNOSIS — I10 Essential (primary) hypertension: Secondary | ICD-10-CM | POA: Diagnosis not present

## 2023-07-18 ENCOUNTER — Encounter: Payer: Self-pay | Admitting: Hematology and Oncology

## 2023-07-21 ENCOUNTER — Ambulatory Visit (INDEPENDENT_AMBULATORY_CARE_PROVIDER_SITE_OTHER): Admitting: Neurology

## 2023-07-21 DIAGNOSIS — M79671 Pain in right foot: Secondary | ICD-10-CM | POA: Diagnosis not present

## 2023-07-21 DIAGNOSIS — G5601 Carpal tunnel syndrome, right upper limb: Secondary | ICD-10-CM

## 2023-07-21 DIAGNOSIS — G8929 Other chronic pain: Secondary | ICD-10-CM

## 2023-07-21 DIAGNOSIS — M79672 Pain in left foot: Secondary | ICD-10-CM | POA: Diagnosis not present

## 2023-07-21 DIAGNOSIS — R200 Anesthesia of skin: Secondary | ICD-10-CM | POA: Diagnosis not present

## 2023-07-21 NOTE — Procedures (Signed)
 [facility]  [address] [place], [address]  [place], [place] [address] Tel: [phone] Fax: [phone] Test Date:  07/21/2023  Patient: Emily Perez DOB: 1955/04/22 Physician: Reyna Cava, DO  Sex: Female Height: 5\' 3"  Ref Phys: Reyna Cava, DO  ID#: [id number]   Technician:    History: This is a 68 year old female referred for evaluation of bilateral feet pain.  NCV & EMG Findings: Electrodiagnostic testing of the right lower extremity and additional studies of the left shows: Bilateral sural and superficial peroneal sensory responses are within normal limits. Bilateral peroneal and tibial motor responses are within normal limits. Bilateral tibial H reflex studies are within normal limits. There is no evidence of active or chronic motor axonal changes affecting any of the tested muscles.  Motor unit configuration and recruitment pattern is within normal limits.   Impression: This is a normal study of the lower extremities.  In particular, there is no evidence of a large fiber sensorimotor polyneuropathy or lumbosacral radiculopathy.    ___________________________ Reyna Cava, DO    Nerve Conduction Studies   Stim Site NR Peak (ms) Norm Peak (ms) O-P Amp (V) Norm O-P Amp  Left Sup Peroneal Anti Sensory (Ant Lat Mall)  32 C  12 cm    2.4 <4.6 7.6 >3  Right Sup Peroneal Anti Sensory (Ant Lat Mall)  32 C  12 cm    2.2 <4.6 6.8 >3  Left Sural Anti Sensory (Lat Mall)  32 C  Calf    2.8 <4.6 8.3 >3  Right Sural Anti Sensory (Lat Mall)  32 C  Calf    2.6 <4.6 8.1 >3     Stim Site NR Onset (ms) Norm Onset (ms) O-P Amp (mV) Norm O-P Amp Site1 Site2 Delta-0 (ms) Dist (cm) Vel (m/s) Norm Vel (m/s)  Left Peroneal Motor (Ext Dig Brev)  32 C  Ankle    3.2 <6.0 5.4 >2.5 B Fib Ankle 6.9 39.0 57 >40  B Fib    10.1  4.9  Poplt B Fib 1.7 8.0 47 >40  Poplt    11.8  4.9         Right Peroneal Motor (Ext Dig Brev)  32 C  Ankle    2.3 <6.0 6.6 >2.5 B Fib Ankle 7.1  37.0 52 >40  B Fib    9.4  6.2  Poplt B Fib 1.5 8.0 53 >40  Poplt    10.9  6.3         Left Tibial Motor (Abd Hall Brev)  32 C  Ankle    4.2 <6.0 11.5 >4 Knee Ankle 7.1 41.0 58 >40  Knee    11.3  6.9         Right Tibial Motor (Abd Hall Brev)  32 C  Ankle    4.2 <6.0 12.3 >4 Knee Ankle 7.7 41.0 53 >40  Knee    11.9  8.1          Electromyography   Side Muscle Ins.Act Fibs Fasc Recrt Amp Dur Poly Activation Comment  Right AntTibialis Nml Nml Nml Nml Nml Nml Nml Nml N/A  Right Gastroc Nml Nml Nml Nml Nml Nml Nml Nml N/A  Right Flex Dig Long Nml Nml Nml Nml Nml Nml Nml Nml N/A  Right RectFemoris Nml Nml Nml Nml Nml Nml Nml Nml N/A  Right BicepsFemS Nml Nml Nml Nml Nml Nml Nml Nml N/A  Left AntTibialis Nml Nml Nml Nml Nml Nml Nml Nml N/A  Left  Gastroc Nml Nml Nml Nml Nml Nml Nml Nml N/A  Left Flex Dig Long Nml Nml Nml Nml Nml Nml Nml Nml N/A  Left RectFemoris Nml Nml Nml Nml Nml Nml Nml Nml N/A  Left BicepsFemS Nml Nml Nml Nml Nml Nml Nml Nml N/A      Waveforms:

## 2023-08-07 ENCOUNTER — Ambulatory Visit
Admission: RE | Admit: 2023-08-07 | Discharge: 2023-08-07 | Disposition: A | Discharge status: Home / Self Care | Source: Ambulatory Visit | Attending: Hematology and Oncology | Admitting: Hematology and Oncology

## 2023-08-07 DIAGNOSIS — Z171 Estrogen receptor negative status [ER-]: Secondary | ICD-10-CM

## 2023-08-07 DIAGNOSIS — Z1239 Encounter for other screening for malignant neoplasm of breast: Secondary | ICD-10-CM | POA: Diagnosis not present

## 2023-08-07 DIAGNOSIS — Z853 Personal history of malignant neoplasm of breast: Secondary | ICD-10-CM | POA: Diagnosis not present

## 2023-08-07 MED ORDER — GADOPICLENOL 0.5 MMOL/ML IV SOLN
6.5000 mL | Freq: Once | INTRAVENOUS | Status: AC | PRN
  Administered 2023-08-07: 6.5 mL via INTRAVENOUS

## 2023-08-12 ENCOUNTER — Encounter: Payer: Self-pay | Admitting: *Deleted

## 2023-08-17 ENCOUNTER — Other Ambulatory Visit: Payer: Self-pay | Admitting: Hematology and Oncology

## 2023-08-17 DIAGNOSIS — C50511 Malignant neoplasm of lower-outer quadrant of right female breast: Secondary | ICD-10-CM

## 2023-08-17 DIAGNOSIS — M19079 Primary osteoarthritis, unspecified ankle and foot: Secondary | ICD-10-CM | POA: Diagnosis not present

## 2023-08-17 DIAGNOSIS — M79674 Pain in right toe(s): Secondary | ICD-10-CM | POA: Diagnosis not present

## 2023-08-17 DIAGNOSIS — C50919 Malignant neoplasm of unspecified site of unspecified female breast: Secondary | ICD-10-CM

## 2023-09-19 DIAGNOSIS — Z853 Personal history of malignant neoplasm of breast: Secondary | ICD-10-CM | POA: Diagnosis not present

## 2023-09-19 DIAGNOSIS — I10 Essential (primary) hypertension: Secondary | ICD-10-CM | POA: Diagnosis not present

## 2023-09-19 DIAGNOSIS — M810 Age-related osteoporosis without current pathological fracture: Secondary | ICD-10-CM | POA: Diagnosis not present

## 2023-09-19 DIAGNOSIS — N1831 Chronic kidney disease, stage 3a: Secondary | ICD-10-CM | POA: Diagnosis not present

## 2023-09-21 DIAGNOSIS — R252 Cramp and spasm: Secondary | ICD-10-CM | POA: Diagnosis not present

## 2023-09-29 DIAGNOSIS — C50511 Malignant neoplasm of lower-outer quadrant of right female breast: Secondary | ICD-10-CM | POA: Diagnosis not present

## 2023-09-29 DIAGNOSIS — Z171 Estrogen receptor negative status [ER-]: Secondary | ICD-10-CM | POA: Diagnosis not present

## 2023-10-20 DIAGNOSIS — N1831 Chronic kidney disease, stage 3a: Secondary | ICD-10-CM | POA: Diagnosis not present

## 2023-10-20 DIAGNOSIS — M810 Age-related osteoporosis without current pathological fracture: Secondary | ICD-10-CM | POA: Diagnosis not present

## 2023-10-20 DIAGNOSIS — I10 Essential (primary) hypertension: Secondary | ICD-10-CM | POA: Diagnosis not present

## 2023-10-20 DIAGNOSIS — Z853 Personal history of malignant neoplasm of breast: Secondary | ICD-10-CM | POA: Diagnosis not present

## 2023-11-20 DIAGNOSIS — Z853 Personal history of malignant neoplasm of breast: Secondary | ICD-10-CM | POA: Diagnosis not present

## 2023-11-20 DIAGNOSIS — N1831 Chronic kidney disease, stage 3a: Secondary | ICD-10-CM | POA: Diagnosis not present

## 2023-11-20 DIAGNOSIS — I10 Essential (primary) hypertension: Secondary | ICD-10-CM | POA: Diagnosis not present

## 2023-11-20 DIAGNOSIS — M810 Age-related osteoporosis without current pathological fracture: Secondary | ICD-10-CM | POA: Diagnosis not present

## 2023-12-20 DIAGNOSIS — I10 Essential (primary) hypertension: Secondary | ICD-10-CM | POA: Diagnosis not present

## 2023-12-20 DIAGNOSIS — M810 Age-related osteoporosis without current pathological fracture: Secondary | ICD-10-CM | POA: Diagnosis not present

## 2023-12-20 DIAGNOSIS — Z853 Personal history of malignant neoplasm of breast: Secondary | ICD-10-CM | POA: Diagnosis not present

## 2023-12-20 DIAGNOSIS — N1831 Chronic kidney disease, stage 3a: Secondary | ICD-10-CM | POA: Diagnosis not present

## 2023-12-22 DIAGNOSIS — H524 Presbyopia: Secondary | ICD-10-CM | POA: Diagnosis not present

## 2023-12-22 DIAGNOSIS — H31012 Macula scars of posterior pole (postinflammatory) (post-traumatic), left eye: Secondary | ICD-10-CM | POA: Diagnosis not present

## 2023-12-22 DIAGNOSIS — H25813 Combined forms of age-related cataract, bilateral: Secondary | ICD-10-CM | POA: Diagnosis not present

## 2024-01-06 DIAGNOSIS — R102 Pelvic and perineal pain unspecified side: Secondary | ICD-10-CM | POA: Diagnosis not present

## 2024-01-06 DIAGNOSIS — Z01419 Encounter for gynecological examination (general) (routine) without abnormal findings: Secondary | ICD-10-CM | POA: Diagnosis not present

## 2024-01-06 DIAGNOSIS — Z1589 Genetic susceptibility to other disease: Secondary | ICD-10-CM | POA: Diagnosis not present

## 2024-01-10 ENCOUNTER — Encounter: Payer: Self-pay | Admitting: Internal Medicine

## 2024-01-10 ENCOUNTER — Ambulatory Visit: Admitting: Internal Medicine

## 2024-01-10 VITALS — BP 118/70 | HR 77 | Ht 63.0 in | Wt 139.0 lb

## 2024-01-10 DIAGNOSIS — J701 Chronic and other pulmonary manifestations due to radiation: Secondary | ICD-10-CM | POA: Diagnosis not present

## 2024-01-10 NOTE — Patient Instructions (Addendum)
  Fibrosis of lung following radiation (HCC)   -In 2020 radiologist thought there was interstitial lung disease [more generalized pulmonary fibrosis] as well as right upper lobe fibrosis but by late 2021 the radiologist determined that there was only right upper lobe radiation related fibrosis and there was no generalized fibrosis.  -Glad feeling well right now and no active symptoms.   - Noted you are fine with expectant followup   Plan -return as needed - shared decision to hold off of on any chest imaging esp CT    - Followup As needed

## 2024-01-10 NOTE — Progress Notes (Signed)
 OV 04/25/2019 -ILD center visit.  Referred by Dr. Slater Staff  Subjective:  Patient ID: Emily Perez, female , DOB: 11/27/1955 , age 68 y.o. , MRN: [id number] , [address] [name]   04/25/2019 -   Chief Complaint  Patient presents with   Consult    Referred by Dr. Staff for possible ILD. Patient reports dry cough. She denies sob.      HPI Emily Perez 68 y.o. -    [facility] Comprehensive ILD Questionnaire  Symptoms: *Insidious onset of cough that started in March 2020 following radiation chemotherapy in 2019 for breast cancer on the right side.  Since it started the cough is the same.  Is moderate but occasionally severe.  She does cough at night particularly the cough is worse towards the end of the day but not necessarily wakes her up at night.  Cough is worse with brushing of the teeth and also increase mobility.  In the past she is coughed up an occasional blood.  The cough is not worse when she lies down does not affect her voice.  She does clear her throat sometimes.  There is a tickle in the throat.  She does not have any dyspnea except extremely mild dyspnea when she climbs stairs.  There is no wheezing.     Past Medical History : Denies any asthma, COPD, heart failure, rheumatoid arthritis, scleroderma, lupus, polymyositis.  Denies Sjogren's or hiatal hernia acid reflux.  Denies any sleep apnea denies any HIV.  Denies pulmonary hypertension denies diabetes denies thyroid  disease denies stroke.  Denies seizures denies mononucleosis or hepatitis.  Denies kidney disease.  Denies pneumonia.  Denies blood clots denies heart disease denies pleurisy.  She has hypertension and orthopedic problems with a phone meniscus.   ROS: Positive for fatigue, arthralgia, pain in her fingers due to neuropathy contractures but no Rayn[place].  She has lost 36 pounds of weight because of breast cancer treatment in the last 2 years.  She is known to  have chronic dermatitis.  Otherwise no nausea vomiting or dysphagia   FAMILY HISTORY of LUNG DISEASE: Strongly positive for pulmonary fibrosis.  She believes both sisters have idiopathic pulmonary fibrosis.  She has a sister currently 18 years of age who has had IPF since she was approximately 76.  This is just on nintedanib.  Analysis is in [place] who is 70 years of age and has been diagnosed with IPF for the last few months the sister is also on nintedanib.  Based on their experience of nintedanib patient is reluctant to do nintedanib.  The sisters also have fibromyalgia.   EXPOSURE HISTORY:-There were kids but dad smoked.  Otherwise no smoking exposure.  No marijuana use currently.  In the past she did smoke marijuana 1980s occasionally.  Never use cocaine abuse intravenous drug use.   HOME and HOBBY DETAILS : Single-family [place] for the last several months.  Age of the home is 65 years.  Strongly denies all the mold and mildew questions and any organic antigen exposure questions in our history   OCCUPATIONAL HISTORY (122 questions) : Positive for possible asbestos exposure when she was at [facility] and working that there is some renovation going on for 2 years.  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  In this  renovation at [employer] she did work in a dusty environment.  They were removing asbestos from an old building.   PULMONARY TOXICITY HISTORY (27 items): In August 2019 she underwent right breast radiation for 32 days.  Before that that she got chemo and radiation    Testing history as below  Results for Emily Perez (MRN [id number]) as of 04/25/2019 12:06  Ref. Range 02/19/2019 09:40  FVC-Pre Latest Units: L 1.99  FVC-%Pred-Pre Latest Units: % 80  FEV1-Pre Latest Units: L 1.61  FEV1-%Pred-Pre Latest Units: % 83  Pre FEV1/FVC ratio Latest Units: % 81   FEV1FVC-%Pred-Pre Latest Units: % 103  Results for Emily Perez (MRN [id number]) as of 04/25/2019 12:06  Ref. Range 02/19/2019 09:40  DLCO unc Latest Units: ml/min/mmHg 11.12  DLCO unc % pred Latest Units: % 57    Results for Emily Perez (MRN [id number]) as of 04/25/2019 12:06  Ref. Range 02/20/2019 11:11  Anti Nuclear Antibody (ANA) Latest Ref Range: NEGATIVE  NEGATIVE  Cyclic Citrullin Peptide Ab Latest Units: UNITS <16  [employer] Latest Ref Range: NOT DETECT  NOT DETECTED  RA Latex Turbid. Latest Ref Range: <14 IU/mL <14    HRCT NOV 2020 -personally visualized and personally interpreted the result and agree with the formal report from the radiologist.  IMPRESSION: 1. New bandlike radiation fibrosis in the peripheral mid to upper right lung. 2. Chronic mild patchy subpleural reticulation and ground-glass attenuation throughout both lungs without bronchiectasis or honeycombing, unchanged since 05/18/2017 chest CT. Findings may represent a mild nonprogressive underlying interstitial lung disease such as nonspecific interstitial pneumonia (NSIP). Usual interstitial pneumonia (UIP) is considered less likely but not entirely excluded. Findings are indeterminate for UIP per consensus guidelines: Diagnosis of Idiopathic Pulmonary Fibrosis: An Official ATS/ERS/JRS/ALAT Clinical Practice Guideline. Am Emily Perez, Iss 5, [phone], Nov 20 2016. 3. One vessel coronary atherosclerosis. 4. No thoracic adenopathy or other findings of metastatic disease in the chest.   Aortic Atherosclerosis (ICD10-I70.0).     Electronically Signed   By: Emily Perez M.D.   On: 01/30/2019 15:13    ROS - per HPI   ASSESSMENT/PLAN   Mild and stable x 2 years Probably not IPF which your sisters x 2 have Probably non-IPF stable varity provoked by radiation and genetics Still there is a  chance this is IPF  Plan  - support non-biopsy approach  - supprt serial  monitoring approach - cancel upcoming HRCT - do spirometry and dlco in 3-4 months - we can discuss visiting genetic counselor Emily Perez at next visit      OV 08/28/2019  Subjective:  Patient ID: Emily Perez, female , DOB: 1955/05/09 , age 26 y.o. , MRN: [id number] , [address] [place] [address]   08/28/2019 -   Chief Complaint  Patient presents with   Follow-up    Pt states she has been doing okay since last visit and denies any complaints.   Follow-up interstitial lung disease [indeterminate for UIP pattern with family history of pulmonary fibrosis and also breast cancer radiation] -preferred supportive care as a goals of care  HPI GLADYS DECKARD 68 y.o. -returns for follow-up.  For interstitial lung disease.  Overall she is stable compared to when I saw her in February 2021.  She is not reporting any worsening symptoms although in the symptom score maybe her dyspnea is worse but she denies this subjectively.  She says the main issue is  her cough.  She has no cough in the summer but in the winter and cold air cough gets worse.  She says despite all this she has a constant presence of a gag particularly when she is brushing the teeth and she has to clear her throat and cough quite violently.  There is no wheezing or dyspnea during this episode.  This been going on for over a year it is new onset.  She thinks it might be related to mint toothpaste but then she has been using the mint toothpaste for many years.  She is frustrated by this.  Overall she prefers a nonpharmaceutical approach.  She is not wanting to undergo lung biopsy.  We discussed antifibrotic's and she is reluctant to take them upfront even if it means that her underlying lung disease might be UIP.  She prefers a wait and watch approach even if it means loss of lung function.  She says she might consider antifibrotic's if there is progression of interstitial lung disease.  At this point in time cough  control and gag control is the main concern she has.  She is also willing to see [name] genetics counselor for family history of pulmonary fibrosis.     OV 03/04/2020   Subjective:  Patient ID: Emily Perez, female , DOB: Sep 12, 1955, age 66 y.o. years. , MRN: [id number],  ADDRESS: [address] [place] [address] PCP  Arloa Fallow, MD Providers : Treatment Team:  Attending Provider: Geronimo Amel, MD Patient Care Team: Arloa Fallow, MD as PCP - General (Family Medicine) Magrinat, Sandria BROCKS, MD as Consulting Physician (Oncology) Ebbie Cough, MD as Consulting Physician (General Surgery) Shannon Agent, MD as Consulting Physician (Radiation Oncology) Shona Rush, MD (Dermatology) Timmie Norris, MD as Consulting Physician (Obstetrics and Gynecology) Josefina Chew, MD as Consulting Physician (Orthopedic Surgery) Renetta Hurl, MD as Physician Assistant (Radiology) Kassie Acquanetta Bradley, MD as Consulting Physician (Pulmonary Disease) Geronimo Amel, MD as Consulting Physician (Pulmonary Disease)    Chief Complaint  Patient presents with   Follow-up    ILD, doing ok     Follow-up interstitial lung disease [indeterminate for UIP pattern with family history of pulmonary fibrosis and also breast cancer radiation] -preferred supportive care as a goals of care    HPI Emily Perez 68 y.o. -returns for follow-up.  Last seen in the summer 2021.  Overall she is stable.  She feels she does not have interstitial lung disease.  Most recent spirometry and DLCO shows mild restriction with slight reduction in diffusion suggesting of an interstitial abnormality but it is stable over time.  She had a high-resolution CT chest this was read by Dr. Conley.  I personally visualized and I do agree with this point that most of the changes are in the right behind the right breast in the right upper lobe consistent with radiation fibrosis.  Overall the severity is  mild.  However I do think I also agree with Dr. Selinda Perez from last year whether some mild interstitial abnormalities on the left side as well.  I visualized these images and showed these images to the patient.  She prefers to have an expectant approach.  I support this because she is feeling well.  In addition most likely etiology is radiation.  She is hesitant to do biopsy or antifibrotic's.  Therefore we can have a discussion about antifibrotic's if she progresses.  The overall prognosis for the next year appears to be good but she  is aware that anything like smoker viruses can flareup her ILD.  We did discuss the fact that ILD even non-- IPF varieties can take a progressive course unexpectedly.      OV 01/11/2022  Subjective:  Patient ID: Emily Perez, female , DOB: 04-07-1955 , age 42 y.o. , MRN: [id number] , ADDRESS: [address] [place] [address] PCP Arloa Fallow, MD Patient Care Team: Arloa Fallow, MD as PCP - General (Family Medicine) Magrinat, Sandria BROCKS, MD (Inactive) as Consulting Physician (Oncology) Ebbie Cough, MD as Consulting Physician (General Surgery) Shannon Agent, MD as Consulting Physician (Radiation Oncology) Shona Rush, MD (Dermatology) Timmie Norris, MD as Consulting Physician (Obstetrics and Gynecology) Josefina Chew, MD as Consulting Physician (Orthopedic Surgery) Renetta Hurl, MD as Physician Assistant (Radiology) Kassie Acquanetta Bradley, MD as Consulting Physician (Pulmonary Disease) Geronimo Amel, MD as Consulting Physician (Pulmonary Disease) Patel, Donika K, DO as Consulting Physician (Neurology)  This Provider for this visit: Treatment Team:  Attending Provider: Geronimo Amel, MD    01/11/2022 -   Chief Complaint  Patient presents with   Follow-up    PT states cough from allergies   HPI Emily Perez 68 y.o. -turns for follow-up.  Last seen almost 2 years ago.  Unclear why she has not followed up.  She  tells me that her respiratory status overall stable.  No new health issues no ER visits no hospitalizations no surgeries no changes in medications.  In terms of her ILD last pulmonary function test and CT scan was approximately 2 years ago.  She does not want a repeat CT scan of the chest if at all possible because of radiation risk.    She believes dyspnea stable but symptom score appears dyspnea small.  She associates this with recurrence of chronic cough that is seasonal between September through March of each year through the fall in the winter season.  In the past her blood eosinophils 300 cells per cubic millimeter.  Remotely skin allergy testing positive details not known.  2 years ago blood IgE was normal.  She says the cough gets worse by the cold air and also by deodorants.  She believes the cough will work itself out by March 2024.  It is mild.  There is no specific wheezing.  She is willing to have a CBC with differential checked today.  Vaccine counseling: She is going to have the COVID mRNA booster but she does not believe in the flu shot.  We discussed RSV vaccine and she does not want this either.         OV 12/06/2022  Subjective:  Patient ID: Emily Perez, female , DOB: Oct 31, 1955 , age 69 y.o. , MRN: [id number] , ADDRESS: [address] [place] [address] PCP Arloa Fallow SAUNDERS, MD Patient Care Team: Arloa Fallow SAUNDERS, MD as PCP - General (Family Medicine) Magrinat, Sandria BROCKS, MD (Inactive) as Consulting Physician (Oncology) Ebbie Cough, MD as Consulting Physician (General Surgery) Shannon Agent, MD as Consulting Physician (Radiation Oncology) Shona Rush, MD (Dermatology) Timmie Norris, MD as Consulting Physician (Obstetrics and Gynecology) Josefina Chew, MD as Consulting Physician (Orthopedic Surgery) Renetta Hurl, MD (Inactive) as Physician Assistant (Radiology) Kassie Acquanetta Bradley, MD as Consulting Physician (Pulmonary Disease) Geronimo Amel, MD as Consulting Physician (Pulmonary Disease) Patel, Donika K, DO as Consulting Physician (Neurology)  This Provider for this visit: Treatment Team:  Attending Provider: Geronimo Amel, MD    12/06/2022 -   Chief Complaint  Patient presents with   Follow-up    Breathing is fine.      HPI Emily Perez 68 y.o. -returns for follow-up.  Last seen in October 2023.  Last CT scan of the chest was in late 2021.  She says she feels fine from a respiratory standpoint.  She gets seasonal cough towards the winter season but currently no cough no shortness of breath no chest pain no chest tightness.  No dyspnea on exertion.  She is questioning the diagnosis of interstitial lung disease.  She says she feels asymptomatic and there physicians question whether she really has this.  Review of the records indicate in 2020 radiologist gave a diagnose of ILD and that so she was referred here.  But by late 2021 the CT scan did not describe any evidence of diffuse ILD but she does have right upper lobe fibrosis.  I personally visualized this and showed the late 2021 film to her.  She has been on supportive care approach..  Not had opportunity to do follow-up CT scan of the chest.  At this point in time she is interested in doing a CT scan but at the time of next follow-up in 6 months.  She is interested in reassessing where her right upper lobe radiation fibrosis is.  Have agreed to remove the ILD from the chart.       Latest Reference Range & Units 08/12/12 21:00 05/11/17 08:33 05/24/17 11:54 06/07/17 08:55 06/14/17 13:03 06/22/17 10:12 06/28/17 12:35 07/05/17 09:12 07/12/17 09:22 07/13/17 16:03 07/19/17 09:05 08/03/17 11:14 09/16/17 13:49 12/20/17 12:58 09/28/18 13:58 02/22/19 12:39 08/28/19 12:27 02/25/20 13:04 08/19/20 13:17 02/19/21 11:01 01/11/22 10:34 03/03/22 11:28  Eosinophils Absolute 0.0 - 0.5 K/uL 0.0 0.2 0.2 0.0 0.0 0.0 0.0 0.0 0.0 0.0 0.0 0.3 0.2 0.1 0.1 0.2 0.1 0.2 0.1 0.3 0.2 0.3     OV 01/10/2024  Subjective:  Patient ID: Emily Perez, female , DOB: Feb 09, 1956 , age 70 y.o. , MRN: [id number] , ADDRESS: [address] [place]  [address] PCP Arloa Elsie SAUNDERS, MD Patient Care Team: Arloa Elsie SAUNDERS, MD as PCP - General (Family Medicine) Ebbie Cough, MD as Consulting Physician (General Surgery) Shannon Agent, MD as Consulting Physician (Radiation Oncology) Shona Rush, MD (Dermatology) Timmie Norris, MD as Consulting Physician (Obstetrics and Gynecology) Josefina Chew, MD as Consulting Physician (Orthopedic Surgery) Kassie Acquanetta Bradley, MD as Consulting Physician (Pulmonary Disease) Geronimo Amel, MD as Consulting Physician (Pulmonary Disease) Patel, Donika K, DO as Consulting Physician (Neurology)  This Provider for this visit: Treatment Team:  Attending Provider: Geronimo Amel, MD   Follow-up right upper lobe radiation fibrosis mention both 2020 and 2021 scan.  -  [indeterminate for UIP pattern with family history of pulmonary fibrosis and also breast cancer radiation] -mention in 2020 scan but not in 2021 scan  - sept 2024: ILD removed from chart    Suffers from seasonal cough September through March every year  -Remote skin allergy testing positive not otherwise specified  -Never taken allergy shots  01/10/2024 -   Chief Complaint  Patient presents with   Medical Management of Chronic Issues     HPI Emily Perez 68 y.o. -*returns for follow-up.  She has localized fibrosis.  She is out of proportion dyspnea.  Last seen 1 year ago.  In the interim he was supposed to see her back in 6 months with a CT scan but she said she canceled the CT scan because she was dealing with  other issues medically.  In the interim no new medical problems no hospitalizations no surgeries but she did have a ER visit in the spring 2025 for blood pressure issues but it is back to normal.  Her symptoms are stable.  Her exercise hypoxemia  test is normal except that is out of proportion dyspnea.  She prefers an expectant follow-up.  She is not interested in getting another CT scan of the chest or chest x-ray.  We took a shared decision making to return as needed.*      SYMPTOM SCALE - ILD 04/25/2019  June 2021 03/04/2020  01/11/2022  01/10/2024    O2 use ra ra ra ra   Shortness of Breath 0 -> 5 scale with 5 being worst (score 6 If unable to do)      At rest 0 0 0 0 0  Simple tasks - showers, clothes change, eating, shaving 0 1 0 1.5 1.5  Household (dishes, doing bed, laundry) 0 1 0 1.5 1.5  Shopping 0 0 0 1.5 1.5  Walking level at own pace 0 1 0.5 1.5 1.5  Walking up Stairs 2 2 1  1.5 2.5  Total (30-36) Dyspnea Score 2 4 1.5 7.5 8.5  How bad is your cough? 2.5 0 cough insummer. 1 and more severe on cold weather.. Gag when brushing teeth Only cough when cold and bette than last year.  Bad - sept - march Very miniumal cough  How bad is your fatigue yes 2 0 1.5   How bad is nausea x 0 0 0   How bad is vomiting?  x 0 0 0   How bad is diarrhea? x 0 0 1   How bad is anxiety? x 2 2.5 1.5   How bad is depression x 0 1 0         SIT STAND TEST - goal 15 times   01/10/2024    O2 used ra   PRobe - finter or forehead finger   Number sit and stand completed - goal 15 15   Time taken to complete 56 sec   Resting Pulse Ox/HR/Dyspnea  98% and 77/min and dyspnea of 1/10    Peak measures 98 % and 112/min and dyspnea of 3/10   Final Pulse Ox/HR 96% and 83/min and dyspnea of 1/10   Desaturated </= 88% no   Desaturated <= 3% points no   Got Tachycardic >/= 90/min yes   Miscellaneous comments Mild dyspnea       PFT     Latest Ref Rng & Units 02/26/2020    1:38 PM 08/24/2019    1:39 PM 02/19/2019    9:40 AM  PFT Results  FVC-Pre L 1.94  2.01  1.99   FVC-Predicted Pre % 79  81  80   FVC-Post L  1.86  1.85   FVC-Predicted Post %  75  74   Pre FEV1/FVC % % 86  82  81   Post FEV1/FCV % %  86  87   FEV1-Pre L 1.68   1.65  1.61   FEV1-Predicted Pre % 88  85  83   FEV1-Post L  1.60  1.61   DLCO uncorrected ml/min/mmHg 14.35  15.50  11.12   DLCO UNC% % 73  79  57   DLCO corrected ml/min/mmHg 14.35     DLCO COR %Predicted % 73     DLVA Predicted % 108  110  92        LAB  RESULTS last 96 hours No results found.       has a past medical history of Allergic rhinitis, Arthritis, Breast cancer (HCC), Family history of breast cancer, Family history of prostate cancer, Headache, Hyperlipidemia, Hypertension, and PONV (postoperative nausea and vomiting).   reports that she has never smoked. She has never used smokeless tobacco.  Past Surgical History:  Procedure Laterality Date   BREAST LUMPECTOMY WITH RADIOACTIVE SEED AND SENTINEL LYMPH NODE BIOPSY Right 08/30/2017   Procedure: RIGHT BREAST RADIOACTIVE SEED GUIDED LUMPECTOMY WITH RIGHT RADIOACTIVE SEED TARGETED AXILLARY LYMPH NODE EXCISION AND RIGHT SENTINEL LYMPH NODE BIOPSY;  Surgeon: Ebbie Cough, MD;  Location: [facility] OR;  Service: General;  Laterality: Right;   COLONOSCOPY     FOOT SURGERY Bilateral    FRACTURE SURGERY Left    wrist   PORTACATH PLACEMENT Right 05/19/2017   Procedure: INSERTION PORT-A-CATH WITH ULTRASOUND;  Surgeon: Ebbie Cough, MD;  [facility];  Service: General;  Laterality: Right;   TUBAL LIGATION     WISDOM TOOTH EXTRACTION      Allergies  Allergen Reactions   Latex Hives and Rash    Immunization History  Administered Date(s) Administered   Moderna Sars-Covid-2 Vaccination 07/15/2019, 08/13/2019   PNEUMOCOCCAL CONJUGATE-20 12/24/2021   Pneumococcal Polysaccharide-23 12/27/2018   Td 10/20/2005   Zoster Recombinant(Shingrix) 01/10/2018, 01/30/2019   Zoster, Live 01/10/2018    Family History  Problem Relation Age of Onset   Heart disease Mother    Cancer Father    Diabetes Father    Hypertension Father    Hyperlipidemia Father    Emphysema Father    Heart disease Brother    Hypertension Brother     Hyperlipidemia Brother    Diabetes Brother    Prostate cancer Brother 83   Breast cancer Sister 74   Breast cancer Sister 60   Asthma Sister    Cancer Paternal Aunt        unsure what type of cancer she had   Prostate cancer Paternal Uncle    Diabetes Brother    Gout Brother    Kidney disease Brother    Lung disease Sister    Breast cancer Sister      Current Outpatient Medications:    amLODipine (NORVASC) 2.5 MG tablet, Take 2.5 mg by mouth at bedtime., Disp: , Rfl:    aspirin 81 MG tablet, Take 81 mg by mouth 3 (three) times a week. , Disp: , Rfl:    doxylamine, Sleep, (UNISOM) 25 MG tablet, Take 50 mg by mouth at bedtime as needed., Disp: , Rfl:    hydrALAZINE  (APRESOLINE ) 25 MG tablet, Take 1 tablet (25 mg total) by mouth 3 (three) times daily as needed for up to 30 doses. For systolic blood pressure over 180 mmhg on 2 repeat checks, Disp: 30 tablet, Rfl: 0   meclizine  (ANTIVERT ) 12.5 MG tablet, , Disp: , Rfl:    Multiple Vitamins-Minerals (MULTIVITAMIN WITH MINERALS) tablet, Take 1 tablet by mouth daily., Disp: , Rfl:    triamterene-hydrochlorothiazide (DYAZIDE) 37.5-25 MG capsule, Take 1 capsule by mouth daily., Disp: , Rfl: 3   zaleplon (SONATA) 5 MG capsule, TK 1 C PO QD HS PRN (Patient not taking: Reported on 06/14/2023), Disp: , Rfl: 5      Objective:   Vitals:   01/10/24 1250  BP: 118/70  Pulse: 77  SpO2: 98%  Weight: 139 lb (63 kg)  Height: 5' 3 (1.6 m)    Estimated body mass index is 24.62 kg/m  as calculated from the following:   Height as of this encounter: 5' 3 (1.6 m).   Weight as of this encounter: 139 lb (63 kg).  @WEIGHTCHANGE @  [employer]   01/10/24 1250  Weight: 139 lb (63 kg)     Physical Exam   General: No distress. Looks well O2 at rest: no Cane present: no Sitting in wheel chair: no Frail: no Obese: no Neuro: Alert and Oriented x 3. GCS 15. Speech normal Psych: Pleasant Resp:  Barrel Chest - no.  Wheeze - no, Crackles -  n, No overt respiratory distress CVS: Normal heart sounds. Murmurs - ono Ext: Stigmata of Connective Tissue Disease - no HEENT: Normal upper airway. PEERL +. No post nasal drip        Assessment/     Assessment & Plan Fibrosis of lung following radiation    PLAN Patient Instructions   Fibrosis of lung following radiation (HCC)   -In 2020 radiologist thought there was interstitial lung disease [more generalized pulmonary fibrosis] as well as right upper lobe fibrosis but by late 2021 the radiologist determined that there was only right upper lobe radiation related fibrosis and there was no generalized fibrosis.  -Glad feeling well right now and no active symptoms.   - Noted you are fine with expectant followup   Plan -return as needed - shared decision to hold off of on any chest imaging esp CT    - Followup As needed    FOLLOWUP    Return if symptoms worsen or fail to improve.    SIGNATURE    Dr. Dorethia Cave, M.D., F.C.C.P,  Pulmonary and Critical Care Medicine Staff Physician, [facility] Center Director - Interstitial Lung Disease  Program  Pulmonary Fibrosis [facility] at [facility] [place], [place], [address]  Pager: [phone], If no answer or between  15:00h - 7:00h: call [phone] Telephone: [phone]  1:12 PM 01/10/2024

## 2024-01-20 DIAGNOSIS — Z853 Personal history of malignant neoplasm of breast: Secondary | ICD-10-CM | POA: Diagnosis not present

## 2024-01-20 DIAGNOSIS — M810 Age-related osteoporosis without current pathological fracture: Secondary | ICD-10-CM | POA: Diagnosis not present

## 2024-01-20 DIAGNOSIS — N1831 Chronic kidney disease, stage 3a: Secondary | ICD-10-CM | POA: Diagnosis not present

## 2024-01-20 DIAGNOSIS — I10 Essential (primary) hypertension: Secondary | ICD-10-CM | POA: Diagnosis not present

## 2024-01-24 DIAGNOSIS — Z1589 Genetic susceptibility to other disease: Secondary | ICD-10-CM | POA: Diagnosis not present

## 2024-01-24 DIAGNOSIS — R102 Pelvic and perineal pain unspecified side: Secondary | ICD-10-CM | POA: Diagnosis not present

## 2024-02-07 DIAGNOSIS — R0989 Other specified symptoms and signs involving the circulatory and respiratory systems: Secondary | ICD-10-CM | POA: Diagnosis not present

## 2024-02-07 DIAGNOSIS — Z853 Personal history of malignant neoplasm of breast: Secondary | ICD-10-CM | POA: Diagnosis not present

## 2024-02-07 DIAGNOSIS — Z8709 Personal history of other diseases of the respiratory system: Secondary | ICD-10-CM | POA: Diagnosis not present

## 2024-02-22 DIAGNOSIS — R7303 Prediabetes: Secondary | ICD-10-CM | POA: Diagnosis not present

## 2024-02-22 DIAGNOSIS — N1831 Chronic kidney disease, stage 3a: Secondary | ICD-10-CM | POA: Diagnosis not present

## 2024-02-22 DIAGNOSIS — I10 Essential (primary) hypertension: Secondary | ICD-10-CM | POA: Diagnosis not present

## 2024-02-22 DIAGNOSIS — E7800 Pure hypercholesterolemia, unspecified: Secondary | ICD-10-CM | POA: Diagnosis not present

## 2024-02-22 DIAGNOSIS — Z713 Dietary counseling and surveillance: Secondary | ICD-10-CM | POA: Diagnosis not present

## 2024-02-24 DIAGNOSIS — Z1231 Encounter for screening mammogram for malignant neoplasm of breast: Secondary | ICD-10-CM | POA: Diagnosis not present

## 2024-03-02 ENCOUNTER — Encounter: Payer: Self-pay | Admitting: Hematology and Oncology

## 2024-03-07 ENCOUNTER — Other Ambulatory Visit (HOSPITAL_BASED_OUTPATIENT_CLINIC_OR_DEPARTMENT_OTHER): Payer: Self-pay

## 2024-03-07 ENCOUNTER — Other Ambulatory Visit: Payer: Self-pay

## 2024-03-07 DIAGNOSIS — Z171 Estrogen receptor negative status [ER-]: Secondary | ICD-10-CM

## 2024-03-07 MED ORDER — FLUZONE HIGH-DOSE 0.5 ML IM SUSY
0.5000 mL | PREFILLED_SYRINGE | Freq: Once | INTRAMUSCULAR | 0 refills | Status: AC
  Filled 2024-03-07: qty 0.5, 1d supply, fill #0

## 2024-03-08 ENCOUNTER — Inpatient Hospital Stay: Payer: Medicare HMO | Admitting: Hematology and Oncology

## 2024-03-08 ENCOUNTER — Inpatient Hospital Stay: Payer: Medicare HMO | Attending: Hematology and Oncology

## 2024-03-08 ENCOUNTER — Encounter: Payer: Self-pay | Admitting: Hematology and Oncology

## 2024-03-08 VITALS — BP 123/73 | HR 83 | Temp 97.7°F | Resp 17 | Wt 137.1 lb

## 2024-03-08 DIAGNOSIS — Z780 Asymptomatic menopausal state: Secondary | ICD-10-CM | POA: Insufficient documentation

## 2024-03-08 DIAGNOSIS — C50919 Malignant neoplasm of unspecified site of unspecified female breast: Secondary | ICD-10-CM | POA: Diagnosis not present

## 2024-03-08 DIAGNOSIS — C50511 Malignant neoplasm of lower-outer quadrant of right female breast: Secondary | ICD-10-CM | POA: Insufficient documentation

## 2024-03-08 DIAGNOSIS — Z171 Estrogen receptor negative status [ER-]: Secondary | ICD-10-CM

## 2024-03-08 DIAGNOSIS — Z17421 Hormone receptor negative with human epidermal growth factor receptor 2 negative status: Secondary | ICD-10-CM | POA: Insufficient documentation

## 2024-03-08 DIAGNOSIS — Z83438 Family history of other disorder of lipoprotein metabolism and other lipidemia: Secondary | ICD-10-CM | POA: Insufficient documentation

## 2024-03-08 DIAGNOSIS — C779 Secondary and unspecified malignant neoplasm of lymph node, unspecified: Secondary | ICD-10-CM | POA: Diagnosis not present

## 2024-03-08 DIAGNOSIS — Z809 Family history of malignant neoplasm, unspecified: Secondary | ICD-10-CM | POA: Insufficient documentation

## 2024-03-08 DIAGNOSIS — Z8419 Family history of other disorders of kidney and ureter: Secondary | ICD-10-CM | POA: Insufficient documentation

## 2024-03-08 DIAGNOSIS — Z9221 Personal history of antineoplastic chemotherapy: Secondary | ICD-10-CM | POA: Insufficient documentation

## 2024-03-08 DIAGNOSIS — D563 Thalassemia minor: Secondary | ICD-10-CM | POA: Diagnosis not present

## 2024-03-08 DIAGNOSIS — Z803 Family history of malignant neoplasm of breast: Secondary | ICD-10-CM | POA: Diagnosis not present

## 2024-03-08 DIAGNOSIS — Z833 Family history of diabetes mellitus: Secondary | ICD-10-CM | POA: Insufficient documentation

## 2024-03-08 DIAGNOSIS — Z91040 Latex allergy status: Secondary | ICD-10-CM | POA: Insufficient documentation

## 2024-03-08 DIAGNOSIS — Z8349 Family history of other endocrine, nutritional and metabolic diseases: Secondary | ICD-10-CM | POA: Insufficient documentation

## 2024-03-08 DIAGNOSIS — Z825 Family history of asthma and other chronic lower respiratory diseases: Secondary | ICD-10-CM | POA: Insufficient documentation

## 2024-03-08 DIAGNOSIS — Z79899 Other long term (current) drug therapy: Secondary | ICD-10-CM | POA: Insufficient documentation

## 2024-03-08 DIAGNOSIS — Z8249 Family history of ischemic heart disease and other diseases of the circulatory system: Secondary | ICD-10-CM | POA: Insufficient documentation

## 2024-03-08 DIAGNOSIS — D509 Iron deficiency anemia, unspecified: Secondary | ICD-10-CM | POA: Insufficient documentation

## 2024-03-08 DIAGNOSIS — Z1501 Genetic susceptibility to malignant neoplasm of breast: Secondary | ICD-10-CM | POA: Diagnosis not present

## 2024-03-08 DIAGNOSIS — Z1589 Genetic susceptibility to other disease: Secondary | ICD-10-CM | POA: Diagnosis not present

## 2024-03-08 DIAGNOSIS — G629 Polyneuropathy, unspecified: Secondary | ICD-10-CM | POA: Diagnosis not present

## 2024-03-08 DIAGNOSIS — Z8042 Family history of malignant neoplasm of prostate: Secondary | ICD-10-CM | POA: Insufficient documentation

## 2024-03-08 DIAGNOSIS — Z9851 Tubal ligation status: Secondary | ICD-10-CM | POA: Insufficient documentation

## 2024-03-08 LAB — CMP (CANCER CENTER ONLY)
ALT: 12 U/L (ref 0–44)
AST: 28 U/L (ref 15–41)
Albumin: 4.4 g/dL (ref 3.5–5.0)
Alkaline Phosphatase: 61 U/L (ref 38–126)
Anion gap: 9 (ref 5–15)
BUN: 21 mg/dL (ref 8–23)
CO2: 30 mmol/L (ref 22–32)
Calcium: 9.5 mg/dL (ref 8.9–10.3)
Chloride: 99 mmol/L (ref 98–111)
Creatinine: 1.04 mg/dL — ABNORMAL HIGH (ref 0.44–1.00)
GFR, Estimated: 58 mL/min — ABNORMAL LOW (ref >60)
Glucose, Bld: 88 mg/dL (ref 70–99)
Potassium: 3.9 mmol/L (ref 3.5–5.1)
Sodium: 138 mmol/L (ref 135–145)
Total Bilirubin: 0.5 mg/dL (ref 0.0–1.2)
Total Protein: 7.7 g/dL (ref 6.5–8.1)

## 2024-03-08 LAB — CBC WITH DIFFERENTIAL (CANCER CENTER ONLY)
Abs Immature Granulocytes: 0.01 K/uL (ref 0.00–0.07)
Basophils Absolute: 0.1 K/uL (ref 0.0–0.1)
Basophils Relative: 1 %
Eosinophils Absolute: 0.1 K/uL (ref 0.0–0.5)
Eosinophils Relative: 2 %
HCT: 33.4 % — ABNORMAL LOW (ref 36.0–46.0)
Hemoglobin: 11 g/dL — ABNORMAL LOW (ref 12.0–15.0)
Immature Granulocytes: 0 %
Lymphocytes Relative: 12 %
Lymphs Abs: 0.8 K/uL (ref 0.7–4.0)
MCH: 25.3 pg — ABNORMAL LOW (ref 26.0–34.0)
MCHC: 32.9 g/dL (ref 30.0–36.0)
MCV: 76.8 fL — ABNORMAL LOW (ref 80.0–100.0)
Monocytes Absolute: 0.7 K/uL (ref 0.1–1.0)
Monocytes Relative: 10 %
Neutro Abs: 5 K/uL (ref 1.7–7.7)
Neutrophils Relative %: 75 %
Platelet Count: 214 K/uL (ref 150–400)
RBC: 4.35 MIL/uL (ref 3.87–5.11)
RDW: 15.1 % (ref 11.5–15.5)
WBC Count: 6.7 K/uL (ref 4.0–10.5)
nRBC: 0 % (ref 0.0–0.2)

## 2024-03-08 NOTE — Progress Notes (Signed)
 [facility]  [phone] [phone]     ID: Emily Perez DOB: 06/25/1955  MR#: [id number]  RDW#:[id number]  Patient Care Team: Arloa Elsie SAUNDERS, MD as PCP - General (Family Medicine) Ebbie Cough, MD as Consulting Physician (General Surgery) Shannon Agent, MD as Consulting Physician (Radiation Oncology) Shona Rush, MD (Dermatology) Timmie Norris, MD as Consulting Physician (Obstetrics and Gynecology) Josefina Chew, MD as Consulting Physician (Orthopedic Surgery) Kassie Acquanetta Bradley, MD as Consulting Physician (Pulmonary Disease) Geronimo Amel, MD as Consulting Physician (Pulmonary Disease) Tobie Tonita POUR, DO as Consulting Physician (Neurology) OTHER MD:  CHIEF COMPLAINT: Triple negative breast cancer; PALB2 mutation  CURRENT TREATMENT: intensified screening  INTERVAL HISTORY:  History of Present Illness Emily Perez is a 68 year old female with PALB2-related right breast cancer and thalassemia trait who presents for routine hematology/oncology follow-up and cancer surveillance.  Recent mammogram was normal, and her next breast MRI is scheduled for May 2026. She reports taking all her prescribed medications and has not had any major health issues. There have been no recent hospitalizations, except for a single emergency department visit earlier in the year for transient hypertension, which resolved within one day. She denies unintentional weight loss, stating her current weight is her pre-pregnancy weight.  She has a history of chronic mild microcytic anemia and reports that her blood counts have remained similar over the past several years. She denies symptoms attributable to anemia, including abnormal bleeding or bruising.  She inquired about her risk of ovarian cancer due to her PALB2 mutation and discussed prophylactic oophorectomy with her gynecologist. She has no personal or family history of ovarian cancer, but two  sisters have had breast cancer and approximately half of her fourteen siblings carry the PALB2 mutation. She expressed interest in oophorectomy without hysterectomy and sought clarification regarding the necessity of hysterectomy, particularly in light of her sister's recent consideration of the procedure.   COVID 19 VACCINATION STATUS: Status post Modernax2, with booster November 2021   HISTORY OF CURRENT ILLNESS: From the original intake note:  Emily Perez had routine bilateral screening mammography at [facility] on 04/19/2017 showing a possible abnormality in the right breast. She underwent unilateral right diagnostic mammography with tomography and right breast ultrasonography at [facility] on 04/25/2017 showing: breast density category B.  In the right breast at the 6:00 radiant 2 cm from the nipple there was a 2 cm oval mass which by ultrasound measured 2.5 cm and was irregular and hypoechoic.  There were also at least 3 abnormal appearing lymph nodes in the right axilla.  Accordingly on 05/02/2017 she proceeded to biopsy of the right breast mass in question and lymph node sampling. The pathology from this procedure showed ([id number]): Invasive ductal carcinoma grade II. One lymph node positive for metastatic carcinoma (1/1). Prognostic indicators significant for: estrogen receptor, 0% negative and progesterone receptor, 0% negative. Proliferation marker Ki67 at 80%. HER2 not amplified with ratios HER2/CEP17 signals 1.29 and average copies per cell 2.25  The patient's subsequent history is as detailed below.   PAST MEDICAL HISTORY: Past Medical History:  Diagnosis Date   Allergic rhinitis    Arthritis    Breast cancer (HCC)    Family history of breast cancer    Family history of prostate cancer    Headache    migraines in the past   Hyperlipidemia    Hypertension    PONV (postoperative nausea and vomiting)    after 1st colonoscopy    PAST SURGICAL HISTORY: Past  Surgical History:   Procedure Laterality Date   BREAST LUMPECTOMY WITH RADIOACTIVE SEED AND SENTINEL LYMPH NODE BIOPSY Right 08/30/2017   Procedure: RIGHT BREAST RADIOACTIVE SEED GUIDED LUMPECTOMY WITH RIGHT RADIOACTIVE SEED TARGETED AXILLARY LYMPH NODE EXCISION AND RIGHT SENTINEL LYMPH NODE BIOPSY;  Surgeon: Ebbie Cough, MD;  Location: [facility] OR;  Service: General;  Laterality: Right;   COLONOSCOPY     FOOT SURGERY Bilateral    FRACTURE SURGERY Left    wrist   PORTACATH PLACEMENT Right 05/19/2017   Procedure: INSERTION PORT-A-CATH WITH ULTRASOUND;  Surgeon: Ebbie Cough, MD;  Location: [facility] OR;  Service: General;  Laterality: Right;   TUBAL LIGATION     WISDOM TOOTH EXTRACTION     Wrist Fracture. Torn Rotator cuff and torn meniscus.    FAMILY HISTORY Family History  Problem Relation Age of Onset   Heart disease Mother    Cancer Father    Diabetes Father    Hypertension Father    Hyperlipidemia Father    Emphysema Father    Heart disease Brother    Hypertension Brother    Hyperlipidemia Brother    Diabetes Brother    Prostate cancer Brother 15   Breast cancer Sister 23   Breast cancer Sister 15   Asthma Sister    Cancer Paternal Aunt        unsure what type of cancer she had   Prostate cancer Paternal Uncle    Diabetes Brother    Gout Brother    Kidney disease Brother    Lung disease Sister    Breast cancer Sister   The patient's father died at age 15 due to heart disease and emphysema. The patient's mother died at age 46 due to heart disease. The patient has 6 brothers and 8 sisters. She notes 2 sisters with breast cancer. The 1st sister was diagnosed at age 64, and the 2nd sister was diagnosed at age 27. She notes that both sisters are alive. She also notes a paternal first cousin with breast cancer diagnosed in her 33's. The patient notes that her father also had bone cancer, but she's unsure if he had myeloma. She denies a family history of ovarian cancer.    GYNECOLOGIC HISTORY:   No LMP recorded. Patient is postmenopausal. 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 She is divorced. She lives by herself with no pets. The patient's daughter, Emily Perez, works in clinical biochemist for [employer]. The patient's son, Emily Perez,  works for [employer]. The patient has  2 grandchildren. She belongs to [facility]     ADVANCED DIRECTIVES: Not in place.  At the 05/19/2017 visit the patient was given the appropriate documents to complete and notarized at her discretion   HEALTH MAINTENANCE: Social History   Tobacco Use   Smoking status: Never   Smokeless tobacco: Never  Vaping Use   Vaping status: Never Used  Substance Use Topics   Alcohol use: No   Drug use: No     Colonoscopy:   PAP: September 2018 normal  Bone density: none   Allergies  Allergen Reactions   Latex Hives and Rash    Current Outpatient Medications  Medication Sig Dispense Refill   amLODipine (NORVASC) 2.5 MG tablet Take 2.5 mg by mouth at bedtime.     aspirin 81  MG tablet Take 81 mg by mouth 3 (three) times a week.      doxylamine, Sleep, (UNISOM) 25 MG tablet Take 50 mg by mouth at bedtime as needed.     hydrALAZINE  (APRESOLINE ) 25 MG tablet Take 1 tablet (25 mg total) by mouth 3 (three) times daily as needed for up to 30 doses. For systolic blood pressure over 180 mmhg on 2 repeat checks 30 tablet 0   Influenza vac split trivalent PF (FLUZONE  HIGH-DOSE) 0.5 ML injection Inject 0.5 mLs into the muscle once for 1 dose. 0.5 mL 0   meclizine  (ANTIVERT ) 12.5 MG tablet      Multiple Vitamins-Minerals (MULTIVITAMIN WITH MINERALS) tablet Take 1 tablet by mouth daily.     triamterene-hydrochlorothiazide (DYAZIDE) 37.5-25 MG capsule Take 1 capsule by mouth daily.  3   zaleplon (SONATA) 5 MG capsule TK 1 C PO  QD HS PRN (Patient not taking: Reported on 06/14/2023)  5   No current facility-administered medications for this visit.    OBJECTIVE: African-American woman who appears well  Vitals:   03/08/24 1337  BP: 123/73  Pulse: 83  Resp: 17  Temp: 97.7 F (36.5 C)  SpO2: 98%      Body mass index is 24.29 kg/m.   Wt Readings from Last 3 Encounters:  03/08/24 137 lb 1.6 oz (62.2 kg)  01/10/24 139 lb (63 kg)  06/14/23 147 lb (66.7 kg)   ECOG FS:1 - Symptomatic but completely ambulatory   Physical Exam Constitutional:      Appearance: Normal appearance.  Chest:     Comments: Bilateral breasts examined. No palpable masses or regional adenopathy. Post lumpectomy changes noted right breast. Musculoskeletal:        General: No swelling or tenderness.     Cervical back: Normal range of motion and neck supple. No rigidity.  Lymphadenopathy:     Cervical: No cervical adenopathy.  Skin:    General: Skin is warm and dry.  Neurological:     General: No focal deficit present.     Mental Status: She is alert.  Psychiatric:        Mood and Affect: Mood normal.     LAB RESULTS:  CMP     Component Value Date/Time   NA 138 03/08/2024 1242   K 3.9 03/08/2024 1242   CL 99 03/08/2024 1242   CO2 30 03/08/2024 1242   GLUCOSE 88 03/08/2024 1242   BUN 21 03/08/2024 1242   CREATININE 1.04 (H) 03/08/2024 1242   CALCIUM 9.5 03/08/2024 1242   PROT 7.7 03/08/2024 1242   ALBUMIN 4.4 03/08/2024 1242   AST 28 03/08/2024 1242   ALT 12 03/08/2024 1242   ALKPHOS 61 03/08/2024 1242   BILITOT 0.5 03/08/2024 1242   GFRNONAA 58 (L) 03/08/2024 1242   GFRAA >60 02/22/2019 1239   GFRAA 46 (L) 08/03/2017 1114    Lab Results  Component Value Date   WBC 6.7 03/08/2024   NEUTROABS 5.0 03/08/2024   HGB 11.0 (L) 03/08/2024   HCT 33.4 (L) 03/08/2024   MCV 76.8 (L) 03/08/2024   PLT 214 03/08/2024    No results found for: LABCA2  No components found for: [id number]  No results for input(s):  INR in the last 168 hours.  No results found for: LABCA2  No results found for: RJW800  No results found for: CAN125  No results found for: CAN153  No results found for: CA2729  No components found for: HGQUANT  No results found for:  CEA1, CEA / No results found for: CEA1, CEA   No results found for: AFPTUMOR  No results found for: CHROMOGRNA  No results found for: TOTALPROTELP, ALBUMINELP, A1GS, A2GS, BETS, BETA2SER, GAMS, MSPIKE, SPEI (this displays SPEP labs)  No results found for: KPAFRELGTCHN, LAMBDASER, KAPLAMBRATIO (kappa/lambda light chains)  No results found for: HGBA, HGBA2QUANT, HGBFQUANT, HGBSQUAN (Hemoglobinopathy evaluation)   No results found for: LDH  Lab Results  Component Value Date   IRON 170 (H) 07/12/2017   TIBC 303 07/12/2017   IRONPCTSAT 56 07/12/2017   (Iron and TIBC)  Lab Results  Component Value Date   FERRITIN 685 (H) 07/12/2017    Urinalysis No results found for: COLORURINE, APPEARANCEUR, LABSPEC, PHURINE, GLUCOSEU, HGBUR, BILIRUBINUR, KETONESUR, PROTEINUR, UROBILINOGEN, NITRITE, LEUKOCYTESUR   STUDIES: No results found.   ELIGIBLE FOR AVAILABLE RESEARCH PROTOCOL: no  ASSESSMENT: 68 y.o. [place] Woman status post right breast upper outer quadrant biopsy 05/02/2017 for a clinical T2 N1-2, stage IIIB invasive ductal carcinoma, grade 2-3, triple negative, with an MIB-1 80%  (a) breast MRI 05/18/2017 shows T3 N1 disease with possible involvement of the internal mammary nodes  (1) neoadjuvant chemotherapy consisting of doxorubicin  and cyclophosphamide  in dose dense fashion x4 starting 05/24/2017, completed 07/05/2017  (a) planned carboplatin/paclitaxel x12 omitted secondary to peripheral neuropathy concerns  (2) status post right lumpectomy 08/30/2017 showing a complete pathologic response (ypT0 ypN0)  (a) a total of 5 lymph nodes were  removed  (3) adjuvant radiation : 10/12/2017-11/28/2017 Site/dose:   1. Right breast, Ax_SCV, 1.8 Gy in 25 fractions for a total dose of 45 Gy.                       2. Right breast, 1.8 Gy in 28 fractions for a total dose of 50.4 Gy                      3. Boost, 2 Gy in 5 fractions for a total dose of 10 Gy  (4) Genetics testing offered through [facility] Multi-cancer Panel on 08/03/2017 showed a pathogenic variant in PALB2 ([id number] (p.Gln60Argfs*7)  (a) there was a VUS identified in CDH1  (b) no additional deleterious mutations were noted in ALK, APC, ATM, AXIN2, BAP1, BARD1, BLM, BMPR1A, BRCA1, BRCA2, BRIP1, CASR, CDC73, CDH1, CDK4, CDKN1B, CDKN1C, CDKN2A (p14ARF), CDKN2A (p16INK4a), CEBPA, CHEK2, CTNNA1, DICER1, DIS3L2, EPCAM*, FH, FLCN, GATA2, GPC3, GREM1*, HRAS, KIT, MAX, MEN1, MET, MLH1, MSH2, MSH3, MSH6, MUTYH, NBN, NF1, NF2, PALB2, PDGFRA, PHOX2B*, PMS2, POLD1, POLE, POT1, PRKAR1A, PTCH1, PTEN, RAD50, RAD51C, RAD51D, RB1, RECQL4, RET, RUNX1, SDHAF2, SDHB, SDHC, SDHD, SMAD4, SMARCA4, SMARCB1, SMARCE1, STK11, SUFU, TERC, TERT, TMEM127, TP53, TSC1, TSC2, VHL, WRN*, WT1. The following genes were evaluated for sequence changes only: EGFR*, HOXB13*, MITF*, NTHL1*, SDHA Results are negative unless otherwise indicated  (c) per NCCN guidelines, there is insufficient data to associate PALB2 mutations with increased ovarian cancer risk; this is managed according to family history  (5) PALB2: intensified screening:  (a) mammography with tomography every November and see Dr. Ebbie December  (b) breast MRI every May and see me in June  (c) family is aware and those who wish to be tested have been tested  (6) PALB2: breast cancer prophylaxis:   (a) anastrozole  October 2019, discontinued after a few weeks secondary to hair loss and other symptoms.  (7) thalassemia: With MCV of 76.5 and ferritin 685 on April 2019   PLAN: Assessment & Plan PALB2-related breast cancer in female Recent  mammogram normal. - Ordered breast MRI for May 2026. - No concern for recurrence on exam.  Thalassemia trait with chronic mild anemia Chronic mild microcytic anemia consistent with thalassemia trait.  Hemoglobin stable, anemia mild and not clinically significant.  PALB2 gene mutation Carrier of PALB2 mutation with increased risk for breast and ovarian cancer. Discussed risk-reducing surgery options. Bilateral oophorectomy appropriate, hysterectomy not indicated. - Discussed risk-reducing bilateral oophorectomy as minimally invasive, low-risk procedure appropriate for her age and PALB2 status.  Total time spent: 30 minutes  *Total Encounter Time as defined by the Centers for Medicare and Medicaid Services includes, in addition to the face-to-face time of a patient visit (documented in the note above) non-face-to-face time: obtaining and reviewing outside history, ordering and reviewing medications, tests or procedures, care coordination (communications with other health care professionals or caregivers) and documentation in the medical record.

## 2024-03-30 ENCOUNTER — Other Ambulatory Visit: Payer: Self-pay

## 2024-08-16 ENCOUNTER — Other Ambulatory Visit

## 2025-03-07 ENCOUNTER — Inpatient Hospital Stay: Attending: Hematology and Oncology | Admitting: Hematology and Oncology
